# Patient Record
Sex: Male | Born: 1970 | Race: White | Hispanic: No | Marital: Married | Smoking: Former smoker
Health system: Southern US, Community
[De-identification: ages and names within clinical notes are randomized; demographics above are authoritative.]

## PROBLEM LIST (undated history)

## (undated) DIAGNOSIS — E119 Type 2 diabetes mellitus without complications: Secondary | ICD-10-CM

## (undated) DIAGNOSIS — I493 Ventricular premature depolarization: Secondary | ICD-10-CM

## (undated) DIAGNOSIS — I429 Cardiomyopathy, unspecified: Secondary | ICD-10-CM

## (undated) DIAGNOSIS — M109 Gout, unspecified: Secondary | ICD-10-CM

## (undated) DIAGNOSIS — I10 Essential (primary) hypertension: Secondary | ICD-10-CM

## (undated) DIAGNOSIS — I509 Heart failure, unspecified: Secondary | ICD-10-CM

## (undated) DIAGNOSIS — E785 Hyperlipidemia, unspecified: Secondary | ICD-10-CM

## (undated) DIAGNOSIS — R0683 Snoring: Secondary | ICD-10-CM

## (undated) DIAGNOSIS — R001 Bradycardia, unspecified: Secondary | ICD-10-CM

## (undated) DIAGNOSIS — G8929 Other chronic pain: Secondary | ICD-10-CM

## (undated) DIAGNOSIS — M545 Low back pain, unspecified: Secondary | ICD-10-CM

## (undated) DIAGNOSIS — R29818 Other symptoms and signs involving the nervous system: Secondary | ICD-10-CM

## (undated) DIAGNOSIS — I4729 Other ventricular tachycardia: Secondary | ICD-10-CM

## (undated) DIAGNOSIS — E669 Obesity, unspecified: Secondary | ICD-10-CM

## (undated) DIAGNOSIS — I2510 Atherosclerotic heart disease of native coronary artery without angina pectoris: Secondary | ICD-10-CM

## (undated) DIAGNOSIS — I5042 Chronic combined systolic (congestive) and diastolic (congestive) heart failure: Secondary | ICD-10-CM

## (undated) DIAGNOSIS — I428 Other cardiomyopathies: Secondary | ICD-10-CM

## (undated) DIAGNOSIS — I472 Ventricular tachycardia: Secondary | ICD-10-CM

## (undated) DIAGNOSIS — M1990 Unspecified osteoarthritis, unspecified site: Secondary | ICD-10-CM

## (undated) HISTORY — DX: Bradycardia, unspecified: R00.1

## (undated) HISTORY — PX: APPENDECTOMY: SHX54

## (undated) HISTORY — DX: Other cardiomyopathies: I42.8

## (undated) HISTORY — DX: Hyperlipidemia, unspecified: E78.5

## (undated) HISTORY — DX: Snoring: R06.83

## (undated) HISTORY — DX: Ventricular premature depolarization: I49.3

## (undated) HISTORY — PX: CARDIAC PACEMAKER PLACEMENT: SHX583

## (undated) HISTORY — DX: Cardiomyopathy, unspecified: I42.9

## (undated) HISTORY — PX: APPENDECTOMY (OPEN): SHX54

---

## 2011-07-27 ENCOUNTER — Encounter (HOSPITAL_COMMUNITY): Payer: Self-pay

## 2011-07-27 ENCOUNTER — Emergency Department (HOSPITAL_COMMUNITY): Payer: Self-pay

## 2011-07-27 ENCOUNTER — Emergency Department (HOSPITAL_COMMUNITY)
Admission: EM | Admit: 2011-07-27 | Discharge: 2011-07-27 | Disposition: A | Discharge status: Home / Self Care | Payer: Self-pay | Attending: Emergency Medicine | Admitting: Emergency Medicine

## 2011-07-27 DIAGNOSIS — M51379 Other intervertebral disc degeneration, lumbosacral region without mention of lumbar back pain or lower extremity pain: Secondary | ICD-10-CM | POA: Insufficient documentation

## 2011-07-27 DIAGNOSIS — IMO0002 Reserved for concepts with insufficient information to code with codable children: Secondary | ICD-10-CM

## 2011-07-27 DIAGNOSIS — M5137 Other intervertebral disc degeneration, lumbosacral region: Secondary | ICD-10-CM | POA: Insufficient documentation

## 2011-07-27 DIAGNOSIS — M549 Dorsalgia, unspecified: Secondary | ICD-10-CM | POA: Insufficient documentation

## 2011-07-27 DIAGNOSIS — R109 Unspecified abdominal pain: Secondary | ICD-10-CM | POA: Insufficient documentation

## 2011-07-27 LAB — DIFFERENTIAL
Basophils Absolute: 0 10*3/uL (ref 0.0–0.1)
Basophils Relative: 1 % (ref 0–1)
Eosinophils Absolute: 0.1 10*3/uL (ref 0.0–0.7)
Eosinophils Relative: 1 % (ref 0–5)
Monocytes Absolute: 0.5 10*3/uL (ref 0.1–1.0)

## 2011-07-27 LAB — BASIC METABOLIC PANEL
CO2: 26 mEq/L (ref 19–32)
Calcium: 9.2 mg/dL (ref 8.4–10.5)
Creatinine, Ser: 0.87 mg/dL (ref 0.50–1.35)
GFR calc Af Amer: >90 mL/min (ref >90)
GFR calc non Af Amer: >90 mL/min (ref >90)
Sodium: 135 mEq/L (ref 135–145)

## 2011-07-27 LAB — URINALYSIS, ROUTINE W REFLEX MICROSCOPIC
Glucose, UA: NEGATIVE mg/dL
Hgb urine dipstick: NEGATIVE
Ketones, ur: NEGATIVE mg/dL
Protein, ur: NEGATIVE mg/dL
Urobilinogen, UA: 0.2 mg/dL (ref 0.0–1.0)

## 2011-07-27 LAB — CBC
HCT: 40.4 % (ref 39.0–52.0)
MCHC: 33.7 g/dL (ref 30.0–36.0)
MCV: 88.2 fL (ref 78.0–100.0)
Platelets: 234 10*3/uL (ref 150–400)
RDW: 12.4 % (ref 11.5–15.5)
WBC: 6.9 10*3/uL (ref 4.0–10.5)

## 2011-07-27 MED ORDER — SODIUM CHLORIDE 0.9 % IV BOLUS (SEPSIS)
500.0000 mL | Freq: Once | INTRAVENOUS | Status: AC
  Administered 2011-07-27: 14:00:00 via INTRAVENOUS

## 2011-07-27 MED ORDER — SODIUM CHLORIDE 0.9 % IV SOLN: INTRAVENOUS | Status: DC

## 2011-07-27 MED ORDER — ONDANSETRON HCL 4 MG/2ML IJ SOLN
4.0000 mg | Freq: Once | INTRAMUSCULAR | Status: AC
  Administered 2011-07-27: 4 mg via INTRAVENOUS
  Filled 2011-07-27: qty 2

## 2011-07-27 MED ORDER — PREDNISONE 20 MG PO TABS
60.0000 mg | ORAL_TABLET | Freq: Once | ORAL | Status: AC
  Administered 2011-07-27: 60 mg via ORAL
  Filled 2011-07-27: qty 3

## 2011-07-27 MED ORDER — PREDNISONE 50 MG PO TABS: ORAL_TABLET | ORAL | Status: AC

## 2011-07-27 MED ORDER — HYDROMORPHONE HCL PF 1 MG/ML IJ SOLN
1.0000 mg | Freq: Once | INTRAMUSCULAR | Status: AC
  Administered 2011-07-27: 1 mg via INTRAVENOUS
  Filled 2011-07-27: qty 1

## 2011-07-27 NOTE — ED Notes (Signed)
Pt and wife verbalized understanding of d/c instructions and without further questions

## 2011-07-27 NOTE — ED Notes (Signed)
Pt with left flank pain for a week, denies N/V, denies burning with urination, denies hx of kidney stones, states pain is constant with twisting type pain that will get intense at times, denies anything that makes pain better or worse

## 2011-07-27 NOTE — ED Notes (Signed)
Complain of mid back pain for a week. No other symptoms

## 2011-07-27 NOTE — ED Notes (Signed)
Patient transported to CT 

## 2011-07-27 NOTE — ED Provider Notes (Addendum)
History     CSN: [id number]  Arrival date & time 07/27/11  1136   First MD Initiated Contact with Patient 07/27/11 1208      Chief Complaint  Patient presents with  . Back Pain    (Consider location/radiation/quality/duration/timing/severity/associated sxs/prior treatment) HPI Comments: Pain in L flank ~ 1 week.  No UTI sxs.  No fever or chills.  No known injury.  No kidney stone history.  Denies chronic back pain or radicular sxs.  The history is provided by the patient. No language interpreter was used.    History reviewed. No pertinent past medical history.  Past Surgical History  Procedure Date  . Appendectomy     No family history on file.  History  Substance Use Topics  . Smoking status: Not on file  . Smokeless tobacco: Not on file  . Alcohol Use: No      Review of Systems  Constitutional: Negative for fever and chills.  Genitourinary: Positive for flank pain. Negative for dysuria, urgency, frequency and hematuria.  Musculoskeletal: Positive for back pain.  Neurological: Negative for weakness and numbness.  All other systems reviewed and are negative.    Allergies  Review of patient's allergies indicates no known allergies.  Home Medications   Current Outpatient Rx  Name Route Sig Dispense Refill  . PREDNISONE 50 MG PO TABS  One tab po 6 tablet 0    BP 141/94  Pulse 63  Temp 98.2 F (36.8 C) (Oral)  Resp 20  Ht 6\' 2"  (1.88 m)  Wt 330 lb (149.687 kg)  BMI 42.37 kg/m2  SpO2 98%  Physical Exam  Nursing note and vitals reviewed. Constitutional: He is oriented to person, place, and time. He appears well-developed and well-nourished.  HENT:  Head: Normocephalic and atraumatic.  Eyes: EOM are normal.  Neck: Normal range of motion.  Cardiovascular: Normal rate, regular rhythm, normal heart sounds and intact distal pulses.   Pulmonary/Chest: Effort normal and breath sounds normal. No respiratory distress.  Abdominal: Soft. He exhibits no  distension. There is no tenderness.  Musculoskeletal: He exhibits tenderness.       Right shoulder: He exhibits decreased range of motion and tenderness. He exhibits no bony tenderness, no swelling and no pain.       Arms: Neurological: He is alert and oriented to person, place, and time. He has normal strength. No sensory deficit. Coordination and gait normal. GCS eye subscore is 4. GCS verbal subscore is 5. GCS motor subscore is 6.  Reflex Scores:      Patellar reflexes are 2+ on the right side and 2+ on the left side.      Achilles reflexes are 2+ on the right side and 2+ on the left side. Skin: Skin is warm and dry.  Psychiatric: He has a normal mood and affect. Judgment normal.    ED Course  Procedures (including critical care time)  Labs Reviewed  BASIC METABOLIC PANEL - Abnormal; Notable for the following:    Glucose, Bld 158 (*)     All other components within normal limits  URINALYSIS, ROUTINE W REFLEX MICROSCOPIC  CBC  DIFFERENTIAL   Ct Abdomen Pelvis Wo Contrast  07/27/2011  *RADIOLOGY REPORT*  Clinical Data: Left flank pain  CT ABDOMEN AND PELVIS WITHOUT CONTRAST  Technique:  Multidetector CT imaging of the abdomen and pelvis was performed following the standard protocol without intravenous contrast.  Comparison: None.  Findings: No hydronephrosis.  No urinary calculus.  Liver, gallbladder, spleen, pancreas, adrenal  glands are within normal limits.  No free fluid.  Status post appendectomy.  Bladder and prostate are unremarkable.  Severe degenerative disc disease in the lumbar spine with multilevel lateral recess narrowing, foraminal narrowing, and spinal stenosis.  IMPRESSION: No evidence of urinary calculus  Lumbar degenerative disc disease.  This could certainly be the etiology for flank pain.  Original Report Authenticated By: Donavan Burnet, M.D.     1. Back pain   2. Degenerative disc disease       MDM  rx- prednisone 50 mg, 6 Ice F/u with your  PCP        Worthy Rancher, PA 07/27/11 1622  Evalina Field, [place] 08/27/11 [phone]

## 2011-07-27 NOTE — ED Notes (Signed)
R. Miller, PA at bedside  

## 2011-07-27 NOTE — Discharge Instructions (Signed)
Cryotherapy Cryotherapy means treatment with cold. Ice or gel packs can be used to reduce both pain and swelling. Ice is the most helpful within the first 24 to 48 hours after an injury or flareup from overusing a muscle or joint. Sprains, strains, spasms, burning pain, shooting pain, and aches can all be eased with ice. Ice can also be used when recovering from surgery. Ice is effective, has very few side effects, and is safe for most people to use. PRECAUTIONS  Ice is not a safe treatment option for people with:  Raynaud's phenomenon. This is a condition affecting small blood vessels in the extremities. Exposure to cold may cause your problems to return.   Cold hypersensitivity. There are many forms of cold hypersensitivity, including:   Cold urticaria. Red, itchy hives appear on the skin when the tissues begin to warm after being iced.   Cold erythema. This is a red, itchy rash caused by exposure to cold.   Cold hemoglobinuria. Red blood cells break down when the tissues begin to warm after being iced. The hemoglobin that carry oxygen are passed into the urine because they cannot combine with blood proteins fast enough.   Numbness or altered sensitivity in the area being iced.  If you have any of the following conditions, do not use ice until you have discussed cryotherapy with your caregiver:  Heart conditions, such as arrhythmia, angina, or chronic heart disease.   High blood pressure.   Healing wounds or open skin in the area being iced.   Current infections.   Rheumatoid arthritis.   Poor circulation.   Diabetes.  Ice slows the blood flow in the region it is applied. This is beneficial when trying to stop inflamed tissues from spreading irritating chemicals to surrounding tissues. However, if you expose your skin to cold temperatures for too long or without the proper protection, you can damage your skin or nerves. Watch for signs of skin damage due to cold. HOME CARE  INSTRUCTIONS Follow these tips to use ice and cold packs safely.  Place a dry or damp towel between the ice and skin. A damp towel will cool the skin more quickly, so you may need to shorten the time that the ice is used.   For a more rapid response, add gentle compression to the ice.   Ice for no more than 10 to 20 minutes at a time. The bonier the area you are icing, the less time it will take to get the benefits of ice.   Check your skin after 5 minutes to make sure there are no signs of a poor response to cold or skin damage.   Rest 20 minutes or more in between uses.   Once your skin is numb, you can end your treatment. You can test numbness by very lightly touching your skin. The touch should be so light that you do not see the skin dimple from the pressure of your fingertip. When using ice, most people will feel these normal sensations in this order: cold, burning, aching, and numbness.   Do not use ice on someone who cannot communicate their responses to pain, such as small children or people with dementia.  HOW TO MAKE AN ICE PACK Ice packs are the most common way to use ice therapy. Other methods include ice massage, ice baths, and cryo-sprays. Muscle creams that cause a cold, tingly feeling do not offer the same benefits that ice offers and should not be used as a substitute  unless recommended by your caregiver. To make an ice pack, do one of the following:  Place crushed ice or a bag of frozen vegetables in a sealable plastic bag. Squeeze out the excess air. Place this bag inside another plastic bag. Slide the bag into a pillowcase or place a damp towel between your skin and the bag.   Mix 3 parts water with 1 part rubbing alcohol. Freeze the mixture in a sealable plastic bag. When you remove the mixture from the freezer, it will be slushy. Squeeze out the excess air. Place this bag inside another plastic bag. Slide the bag into a pillowcase or place a damp towel between your skin  and the bag.  SEEK MEDICAL CARE IF:  You develop white spots on your skin. This may give the skin a blotchy (mottled) appearance.   Your skin turns blue or pale.   Your skin becomes waxy or hard.   Your swelling gets worse.  MAKE SURE YOU:   Understand these instructions.   Will watch your condition.   Will get help right away if you are not doing well or get worse.  Document Released: 09/17/2010 Document Revised: 01/10/2011 Document Reviewed: 09/17/2010 [facility] Patient Information 2012 [place], [place].Degenerative Disc Disease Degenerative disc disease is a condition caused by the changes that occur in the cushions of the backbone (spinal discs) as you grow older. Spinal discs are soft and compressible discs located between the bones of the spine (vertebrae). They act like shock absorbers. Degenerative disc disease can affect the wholespine. However, the neck and lower back are most commonly affected. Many changes can occur in the spinal discs with aging, such as:  The spinal discs may dry and shrink.   Small tears may occur in the tough, outer covering of the disc (annulus).   The disc space may become smaller due to loss of water.   Abnormal growths in the bone (spurs) may occur. This can put pressure on the nerve roots exiting the spinal canal, causing pain.   The spinal canal may become narrowed.  CAUSES  Degenerative disc disease is a condition caused by the changes that occur in the spinal discs with aging. The exact cause is not known, but there is a genetic basis for many patients. Degenerative changes can occur due to loss of fluid in the disc. This makes the disc thinner and reduces the space between the backbones. Small cracks can develop in the outer layer of the disc. This can lead to the breakdown of the disc. You are more likely to get degenerative disc disease if you are overweight. Smoking cigarettes and doing heavy work such as weightlifting can also increase your  risk of this condition. Degenerative changes can start after a sudden injury. Growth of bone spurs can compress the nerve roots and cause pain.  SYMPTOMS  The symptoms vary from person to person. Some people may have no pain, while others have severe pain. The pain may be so severe that it can limit your activities. The location of the pain depends on the part of your backbone that is affected. You will have neck or arm pain if a disc in the neck area is affected. You will have pain in your back, buttocks, or legs if a disc in the lower back is affected. The pain becomes worse while bending, reaching up, or with twisting movements. The pain may start gradually and then get worse as time passes. It may also start after a major or  minor injury. You may feel numbness or tingling in the arms or legs.  DIAGNOSIS  Your caregiver will ask you about your symptoms and about activities or habits that may cause the pain. He or she may also ask about any injuries, diseases, ortreatments you have had earlier. Your caregiver will examine you to check for the range of movement that is possible in the affected area, to check for strength in your extremities, and to check for sensation in the areas of the arms and legs supplied by different nerve roots. An X-ray of the spine may be taken. Your caregiver may suggest other imaging tests, such as a computerized magnetic scan (MRI), if needed.  TREATMENT  Treatment includes rest, modifying your activities, and applying ice and heat. Your caregiver may prescribe medicines to reduce your pain and may ask you to do some exercises to strengthen your back. In some cases, you may need surgery. You and your caregiver will decide on the treatment that is best for you. HOME CARE INSTRUCTIONS   Follow proper lifting and walking techniques as advised by your caregiver.   Maintain good posture.   Exercise regularly as advised.   Perform relaxation exercises.   Change your sitting,  standing, and sleeping habits as advised. Change positions frequently.   Lose weight as advised.   Stop smoking if you smoke.   Wear supportive footwear.  SEEK MEDICAL CARE IF:  The pain does not go away within 1 to 4 weeks. SEEK IMMEDIATE MEDICAL CARE IF:   The pain is severe.   You notice weakness in your arms, hands, or legs.   You begin to lose control of your bladder or bowel.  MAKE SURE YOU:   Understand these instructions.   Will watch your condition.   Will get help right away if you are not doing well or get worse.  Document Released: 11/18/2006 Document Revised: 01/10/2011 Document Reviewed: 11/18/2006 [facility] Patient Information 2012 [place], [place].   Take the prednisone as directed.  Do NOT take any NSAID's while taking prednisone.  Apply ice several times daily to lower back.. Follow up with your MD on Monday for further instruction.

## 2011-07-30 NOTE — ED Provider Notes (Signed)
Medical screening examination/treatment/procedure(s) were performed by non-physician practitioner and as supervising physician I was immediately available for consultation/collaboration.   Shelda Jakes, MD 07/30/11 1057

## 2011-08-27 NOTE — ED Provider Notes (Signed)
Medical screening examination/treatment/procedure(s) were performed by non-physician practitioner and as supervising physician I was immediately available for consultation/collaboration.   Shelda Jakes, MD 08/27/11 2207

## 2015-07-30 ENCOUNTER — Encounter (HOSPITAL_COMMUNITY): Payer: Self-pay | Admitting: Emergency Medicine

## 2015-07-30 ENCOUNTER — Inpatient Hospital Stay (HOSPITAL_COMMUNITY)
Admission: EM | Admit: 2015-07-30 | Discharge: 2015-08-03 | DRG: 287 | Disposition: A | Discharge status: Home / Self Care | Payer: Self-pay | Attending: Cardiology | Admitting: Cardiology

## 2015-07-30 ENCOUNTER — Other Ambulatory Visit: Payer: Self-pay

## 2015-07-30 ENCOUNTER — Emergency Department (HOSPITAL_COMMUNITY): Payer: Self-pay

## 2015-07-30 DIAGNOSIS — E1165 Type 2 diabetes mellitus with hyperglycemia: Secondary | ICD-10-CM | POA: Diagnosis present

## 2015-07-30 DIAGNOSIS — E782 Mixed hyperlipidemia: Secondary | ICD-10-CM | POA: Diagnosis present

## 2015-07-30 DIAGNOSIS — E1169 Type 2 diabetes mellitus with other specified complication: Secondary | ICD-10-CM | POA: Diagnosis present

## 2015-07-30 DIAGNOSIS — I429 Cardiomyopathy, unspecified: Secondary | ICD-10-CM | POA: Diagnosis present

## 2015-07-30 DIAGNOSIS — E785 Hyperlipidemia, unspecified: Secondary | ICD-10-CM

## 2015-07-30 DIAGNOSIS — I472 Ventricular tachycardia: Secondary | ICD-10-CM | POA: Diagnosis present

## 2015-07-30 DIAGNOSIS — Z8249 Family history of ischemic heart disease and other diseases of the circulatory system: Secondary | ICD-10-CM

## 2015-07-30 DIAGNOSIS — R001 Bradycardia, unspecified: Secondary | ICD-10-CM | POA: Diagnosis present

## 2015-07-30 DIAGNOSIS — R079 Chest pain, unspecified: Secondary | ICD-10-CM

## 2015-07-30 DIAGNOSIS — G4730 Sleep apnea, unspecified: Secondary | ICD-10-CM | POA: Diagnosis present

## 2015-07-30 DIAGNOSIS — I200 Unstable angina: Secondary | ICD-10-CM | POA: Diagnosis present

## 2015-07-30 DIAGNOSIS — R0789 Other chest pain: Principal | ICD-10-CM | POA: Diagnosis present

## 2015-07-30 DIAGNOSIS — Z7982 Long term (current) use of aspirin: Secondary | ICD-10-CM

## 2015-07-30 DIAGNOSIS — Z87891 Personal history of nicotine dependence: Secondary | ICD-10-CM

## 2015-07-30 DIAGNOSIS — Z6841 Body Mass Index (BMI) 40.0 and over, adult: Secondary | ICD-10-CM

## 2015-07-30 LAB — CBC
HEMATOCRIT: 44 % (ref 39.0–52.0)
Hemoglobin: 14.8 g/dL (ref 13.0–17.0)
MCH: 30.1 pg (ref 26.0–34.0)
MCHC: 33.6 g/dL (ref 30.0–36.0)
MCV: 89.6 fL (ref 78.0–100.0)
PLATELETS: 233 10*3/uL (ref 150–400)
RBC: 4.91 MIL/uL (ref 4.22–5.81)
RDW: 12.5 % (ref 11.5–15.5)
WBC: 8.5 10*3/uL (ref 4.0–10.5)

## 2015-07-30 LAB — BASIC METABOLIC PANEL
ANION GAP: 7 (ref 5–15)
BUN: 19 mg/dL (ref 6–20)
CHLORIDE: 106 mmol/L (ref 101–111)
CO2: 25 mmol/L (ref 22–32)
Calcium: 9 mg/dL (ref 8.9–10.3)
Creatinine, Ser: 0.93 mg/dL (ref 0.61–1.24)
Glucose, Bld: 188 mg/dL — ABNORMAL HIGH (ref 65–99)
POTASSIUM: 4.3 mmol/L (ref 3.5–5.1)
SODIUM: 138 mmol/L (ref 135–145)

## 2015-07-30 LAB — TROPONIN I
TROPONIN I: 0.04 ng/mL — AB (ref <0.031)
Troponin I: 0.04 ng/mL — ABNORMAL HIGH (ref <0.031)

## 2015-07-30 LAB — GLUCOSE, CAPILLARY: Glucose-Capillary: 134 mg/dL — ABNORMAL HIGH (ref 65–99)

## 2015-07-30 LAB — I-STAT TROPONIN, ED: Troponin i, poc: 0.01 ng/mL (ref 0.00–0.08)

## 2015-07-30 LAB — TSH: TSH: 0.517 u[IU]/mL (ref 0.350–4.500)

## 2015-07-30 LAB — D-DIMER, QUANTITATIVE (NOT AT ARMC)

## 2015-07-30 MED ORDER — ASPIRIN EC 81 MG PO TBEC
81.0000 mg | DELAYED_RELEASE_TABLET | Freq: Every day | ORAL | Status: DC
  Administered 2015-07-31 – 2015-08-03 (×4): 81 mg via ORAL
  Filled 2015-07-30 (×4): qty 1

## 2015-07-30 MED ORDER — SODIUM CHLORIDE 0.9 % IV BOLUS (SEPSIS)
500.0000 mL | Freq: Once | INTRAVENOUS | Status: AC
  Administered 2015-07-30: 500 mL via INTRAVENOUS

## 2015-07-30 MED ORDER — MORPHINE SULFATE (PF) 2 MG/ML IV SOLN
2.0000 mg | Freq: Once | INTRAVENOUS | Status: AC
  Administered 2015-07-30: 2 mg via INTRAVENOUS
  Filled 2015-07-30 (×2): qty 1

## 2015-07-30 MED ORDER — NITROGLYCERIN 0.4 MG SL SUBL: 0.4000 mg | SUBLINGUAL_TABLET | SUBLINGUAL | Status: DC | PRN

## 2015-07-30 MED ORDER — SODIUM CHLORIDE 0.9% FLUSH
3.0000 mL | Freq: Two times a day (BID) | INTRAVENOUS | Status: DC
  Administered 2015-07-31 – 2015-08-03 (×6): 3 mL via INTRAVENOUS

## 2015-07-30 MED ORDER — ACETAMINOPHEN 650 MG RE SUPP: 650.0000 mg | Freq: Four times a day (QID) | RECTAL | Status: DC | PRN

## 2015-07-30 MED ORDER — ONDANSETRON HCL 4 MG/2ML IJ SOLN: 4.0000 mg | Freq: Four times a day (QID) | INTRAMUSCULAR | Status: DC | PRN

## 2015-07-30 MED ORDER — ACETAMINOPHEN 325 MG PO TABS
650.0000 mg | ORAL_TABLET | Freq: Four times a day (QID) | ORAL | Status: DC | PRN
  Administered 2015-07-30 – 2015-08-02 (×6): 650 mg via ORAL
  Filled 2015-07-30 (×7): qty 2

## 2015-07-30 MED ORDER — OXYCODONE HCL 5 MG PO TABS
5.0000 mg | ORAL_TABLET | ORAL | Status: DC | PRN
  Administered 2015-08-01 – 2015-08-02 (×2): 5 mg via ORAL
  Filled 2015-07-30 (×2): qty 1

## 2015-07-30 MED ORDER — MORPHINE SULFATE (PF) 2 MG/ML IV SOLN
2.0000 mg | INTRAVENOUS | Status: DC | PRN
  Administered 2015-07-31: 2 mg via INTRAVENOUS
  Filled 2015-07-30: qty 1

## 2015-07-30 MED ORDER — NITROGLYCERIN 2 % TD OINT
1.0000 [in_us] | TOPICAL_OINTMENT | Freq: Three times a day (TID) | TRANSDERMAL | Status: AC
  Administered 2015-07-30 (×2): 1 [in_us] via TOPICAL
  Filled 2015-07-30 (×2): qty 1

## 2015-07-30 MED ORDER — METOPROLOL TARTRATE 25 MG PO TABS
25.0000 mg | ORAL_TABLET | Freq: Once | ORAL | Status: AC
  Administered 2015-07-30: 25 mg via ORAL
  Filled 2015-07-30: qty 1

## 2015-07-30 MED ORDER — TRAZODONE HCL 50 MG PO TABS: 50.0000 mg | ORAL_TABLET | Freq: Every evening | ORAL | Status: DC | PRN

## 2015-07-30 MED ORDER — ATORVASTATIN CALCIUM 40 MG PO TABS
40.0000 mg | ORAL_TABLET | Freq: Every day | ORAL | Status: DC
  Administered 2015-07-30 – 2015-08-02 (×4): 40 mg via ORAL
  Filled 2015-07-30 (×4): qty 1

## 2015-07-30 MED ORDER — SENNA 8.6 MG PO TABS
1.0000 | ORAL_TABLET | Freq: Two times a day (BID) | ORAL | Status: DC
  Administered 2015-07-30 – 2015-08-03 (×8): 8.6 mg via ORAL
  Filled 2015-07-30 (×8): qty 1

## 2015-07-30 MED ORDER — SODIUM CHLORIDE 0.9% FLUSH: 3.0000 mL | INTRAVENOUS | Status: DC | PRN

## 2015-07-30 MED ORDER — ALBUTEROL SULFATE (2.5 MG/3ML) 0.083% IN NEBU: 2.5000 mg | INHALATION_SOLUTION | RESPIRATORY_TRACT | Status: DC | PRN

## 2015-07-30 MED ORDER — PNEUMOCOCCAL VAC POLYVALENT 25 MCG/0.5ML IJ INJ
0.5000 mL | INJECTION | INTRAMUSCULAR | Status: AC
  Administered 2015-07-31: 0.5 mL via INTRAMUSCULAR
  Filled 2015-07-30: qty 0.5

## 2015-07-30 MED ORDER — POLYETHYLENE GLYCOL 3350 17 G PO PACK
17.0000 g | PACK | Freq: Every day | ORAL | Status: DC | PRN
Start: 2015-07-30 — End: 2015-08-03

## 2015-07-30 MED ORDER — SODIUM CHLORIDE 0.9 % IV SOLN
250.0000 mL | INTRAVENOUS | Status: DC | PRN
  Administered 2015-07-31: 125 mL/h via INTRAVENOUS
  Administered 2015-07-31: 250 mL via INTRAVENOUS

## 2015-07-30 MED ORDER — ONDANSETRON HCL 4 MG PO TABS: 4.0000 mg | ORAL_TABLET | Freq: Four times a day (QID) | ORAL | Status: DC | PRN

## 2015-07-30 MED ORDER — ASPIRIN 81 MG PO CHEW
324.0000 mg | CHEWABLE_TABLET | Freq: Once | ORAL | Status: AC
  Administered 2015-07-30: 324 mg via ORAL
  Filled 2015-07-30: qty 4

## 2015-07-30 MED ORDER — HEPARIN SODIUM (PORCINE) 5000 UNIT/ML IJ SOLN
5000.0000 [IU] | Freq: Three times a day (TID) | INTRAMUSCULAR | Status: DC
  Administered 2015-07-30: 5000 [IU] via SUBCUTANEOUS
  Filled 2015-07-30: qty 1

## 2015-07-30 NOTE — ED Notes (Signed)
Pt initially refused Morphine stating he was not having pain.  Then changed his mind when I entered room with fluids.  States pain is intermittent in nature.

## 2015-07-30 NOTE — ED Provider Notes (Signed)
CSN: [id number]     Arrival date & time 07/30/15  0940 History  By signing my name below, I, Majel Homer, attest that this documentation has been prepared under the direction and in the presence of Loren Racer, MD . Electronically Signed: Majel Homer, Scribe. 07/30/2015. 12:20 PM.   Chief Complaint  Patient presents with  . Chest Pain   The history is provided by the patient. No language interpreter was used.   HPI Comments: Dani Mikrut is a 45 y.o. male who presents to the Emergency Department complaining of intermittent, radiating, chest pain and chest tightness that began at ~9:00AM this morning. Pt reports his pain is radiating to his left shoulder and left elbow. He states his pain is currently 2/10; however, it is 6/10 when his chest pain worsens. Pt states he was carrying something to the dumpster this morning when he experienced shortness of breath and bilateral leg heaviness. When he sat down he began having left-sided chest tightness. He reports associated symptoms of confusion, diaphoresis, headache, and lightheadedness when sitting up and down. He notes he has not experienced similar symptoms in a long time; he reports he visited [facility] ED ~3-4 years ago for chest pain. He reports FHx of heart disease at a young age. Father had MI in 44s and died in early 76s. No history of tobacco use. No recent extended travel or immobilization. He states he has not taken any medication to relieve his symptoms. Pt denies coughing, nausea, vomiting, abd pain or swelling in his lower extremities.   Past Medical History  Diagnosis Date  . Diabetes [facility])    Past Surgical History  Procedure Laterality Date  . Appendectomy     Family History  Problem Relation Age of Onset  . Heart attack Father 60    Died early 63's   . Coronary artery disease Brother 48    STENTS  . Angina Sister 66    Chronic   Social History  Substance Use Topics  . Smoking status: Former Games developer  . Smokeless tobacco: None   . Alcohol Use: No    Review of Systems  Constitutional: Positive for diaphoresis and activity change. Negative for fever and chills.  Respiratory: Positive for chest tightness and shortness of breath. Negative for cough and wheezing.   Cardiovascular: Positive for chest pain and palpitations. Negative for leg swelling.  Gastrointestinal: Negative for nausea, vomiting, abdominal pain, diarrhea and constipation.  Musculoskeletal: Negative for myalgias, back pain, neck pain and neck stiffness.  Skin: Positive for rash. Negative for wound.  Neurological: Positive for dizziness, weakness (lower extremities), light-headedness and headaches. Negative for seizures, syncope and numbness.  Psychiatric/Behavioral: Positive for confusion. The patient is nervous/anxious.   All other systems reviewed and are negative.  Allergies  Review of patient's allergies indicates no known allergies.  Home Medications   Prior to Admission medications   Not on File   Triage Vitals: BP 143/96 mmHg  Pulse 72  Temp(Src) 98.9 F (37.2 C) (Oral)  Resp 21  Ht 6\' 2"  (1.88 m)  Wt 320 lb (145.151 kg)  BMI 41.07 kg/m2  SpO2 100% Physical Exam  Constitutional: He is oriented to person, place, and time. He appears well-developed and well-nourished. No distress.  Anxious appearing  HENT:  Head: Normocephalic and atraumatic.  Mouth/Throat: Oropharynx is clear and moist.  Erythematous macular rash to forehead and bilateral cheeks  Eyes: EOM are normal. Pupils are equal, round, and reactive to light.  Neck: Normal range of motion. Neck  supple. No JVD present.  Cardiovascular: Normal rate and regular rhythm.  Exam reveals no gallop and no friction rub.   No murmur heard. Pulmonary/Chest: Effort normal and breath sounds normal. No respiratory distress. He has no wheezes. He has no rales. He exhibits no tenderness.  Abdominal: Soft. Bowel sounds are normal. He exhibits no distension and no mass. There is no  tenderness. There is no rebound and no guarding.  Musculoskeletal: Normal range of motion. He exhibits no edema or tenderness.  No lower extremity swelling, asymmetry or tenderness. Distal pulses are equal and intact.  Neurological: He is alert and oriented to person, place, and time.  5/5 motor in all extremities. Sensation is fully intact. No slurred speech. Cranial nerves II through XII grossly intact.  Skin: Skin is warm and dry. Rash noted. No erythema.  Psychiatric:  Anxious with some trouble word finding.   Nursing note and vitals reviewed.   ED Course  Procedures  DIAGNOSTIC STUDIES:  Oxygen Saturation is 100% on RA, normal by my interpretation.    COORDINATION OF CARE:    Labs Review Labs Reviewed  BASIC METABOLIC PANEL - Abnormal; Notable for the following:    Glucose, Bld 188 (*)    All other components within normal limits  HEMOGLOBIN A1C - Abnormal; Notable for the following:    Hgb A1c MFr Bld 8.8 (*)    All other components within normal limits  TROPONIN I - Abnormal; Notable for the following:    Troponin I 0.04 (*)    All other components within normal limits  TROPONIN I - Abnormal; Notable for the following:    Troponin I 0.04 (*)    All other components within normal limits  GLUCOSE, CAPILLARY - Abnormal; Notable for the following:    Glucose-Capillary 134 (*)    All other components within normal limits  BASIC METABOLIC PANEL - Abnormal; Notable for the following:    Glucose, Bld 186 (*)    BUN 24 (*)    Calcium 8.4 (*)    All other components within normal limits  CBC - Abnormal; Notable for the following:    Hemoglobin 12.8 (*)    All other components within normal limits  LIPID PANEL - Abnormal; Notable for the following:    Triglycerides 415 (*)    HDL 30 (*)    All other components within normal limits  CBC  D-DIMER, QUANTITATIVE (NOT AT [facility])  TSH  TROPONIN I  PROTIME-INR  I-STAT TROPOININ, ED    Imaging Review Dg Chest 2  View  07/30/2015  CLINICAL DATA:  Left-sided chest pain this morning. Shortness of breath. Former smoker. EXAM: CHEST  2 VIEW COMPARISON:  None. FINDINGS: The cardiomediastinal silhouette is within normal limits. The lungs are mildly hypoinflated. There is mild central airway thickening. No segmental airspace consolidation, overt pulmonary edema, pleural effusion, or pneumothorax is identified. No acute osseous abnormality is seen. IMPRESSION: Mild bronchitic changes. Electronically Signed   By: Sebastian Ache M.D.   On: 07/30/2015 10:23   I have personally reviewed and evaluated these images and lab results as part of my medical decision-making.   EKG Interpretation   Date/Time:  Sunday July 30 2015 09:45:56 EDT Ventricular Rate:  73 PR Interval:    QRS Duration: 102 QT Interval:  378 QTC Calculation: 417 R Axis:   67 Text Interpretation:  Sinus rhythm Anteroseptal infarct, old Confirmed by  Ranae Palms  MD, Andriel Omalley ([id number]) on 07/30/2015 10:46:47 AM  MDM   Final diagnoses:  Chest pain, unspecified chest pain type    I personally performed the services described in this documentation, which was scribed in my presence. The recorded information has been reviewed and is accurate.     EKG without any evidence of ischemia. Initial troponin is normal. Patient had miscarried improvement after nitroglycerin, aspirin and morphine. Discussed with cardiology. Recommend [facility] admission for rule out and can consult cardiology to see in the morning.    Loren Racer, MD 07/31/15 1220

## 2015-07-30 NOTE — ED Notes (Signed)
Pt states initially his pain was better, but rates at a 5 currently.  States this is the worse it has been, but that it comes in waves and does not last.  MD aware.

## 2015-07-30 NOTE — H&P (Signed)
Patient Demographics:    Matthew Sexton, is a 45 y.o. male  MRN: [id number]   DOB - [birth year]  Admit Date - 07/30/2015  Outpatient Primary MD for the patient is [name], MD   Assessment & Plan:    Principal Problem:   Chest pain Active Problems:   Diabetes (HCC)   Morbid obesity (HCC)    1)Atypical Chest Pain-  Cardiovascular risk factors include diabetes, obesity, sedentary lifestyle, strong family history of premature CAD in multiple first degree relatives,,   Refer to  Observation status on  telemetry monitored unit, check serial troponins and EKG for rule out acute coronary syndrome .  If patient rules out for ACS, we will consider stress in am , if stress test if positive then please get Cardiology consult.  Give aspirin, nitroglycerin, and betablocker.  patient has consistently reproducible Lt costo- chondral area chest tenderness     2)Obesity/Diet Controlled DM- hemoglobin A1c, diet and lifestyle modifications discussed    With History of - Reviewed by me  History reviewed. No pertinent past medical history.    Past Surgical History  Procedure Laterality Date  . Appendectomy        Chief Complaint  Patient presents with  . Chest Pain      HPI:    Matthew Sexton  is a 45 y.o. male, Past medical history relevant for diet-controlled diabetes, obesity, sedentary lifestyle, strong family history of premature CAD in multiple first degree relatives who presents with intermittent episodes of left-sided chest pain . Cp was intermittent, radiating, chest pain and chest tightness that began at ~9:00AM this morning. Pt reports his pain is radiating to his left shoulder and left elbow. Pt states he was carrying something to the dumpster this morning when he experienced shortness of breath and  bilateral leg heaviness. When he sat down he began having left-sided chest tightness. He reports associated symptoms of confusion, diaphoresis, headache, and lightheadedness when sitting up and down. He notes he has not experienced similar symptoms in a long time; he reports he visited [facility] ED , the negative stress echo in May 2013 at [facility] .  No tobacco use in over 30 years. No recent extended travel or immobilization.Pt denies coughing, nausea, vomiting, abd pain or swelling in his lower extremities. No pleuritic symptoms. Additional history obtained from patient's wife at bedside    Review of systems:    In addition to the HPI above,   A full 12 point Review of Systems was done, except as stated above, all other Review of Systems were negative.    Social History:  Reviewed by me    Social History  Substance Use Topics  . Smoking status: Former Games developer  . Smokeless tobacco: Not on file  . Alcohol Use: No       Family History :  Reviewed by me   History reviewed. No pertinent family history.  Home Medications:   Prior to Admission medications   Not on File     Allergies:    No Known Allergies   Physical Exam:   Vitals  Blood pressure 125/75, pulse 66, temperature 98.1 F (36.7 C), temperature source Oral, resp. rate 18, height 6\' 2"  (1.88 m), weight 145.151 kg (320 lb), SpO2 99 %.  Physical Examination: General appearance - alert, Obese, and in no distress  Mental status - alert, oriented to person, place, and time,  Eyes - sclera anicteric Neck - supple, no JVD elevation , Chest - clear  to auscultation bilaterally, symmetrical air movement,  patient has consistently reproducible Lt costo- chondral area chest tenderness Heart - S1 and S2 normal,  Abdomen - soft, nontender, nondistended,  increased truncal adiposity Neurological - screening mental status exam normal, neck supple without rigidity, cranial nerves II through XII intact,  DTR's normal and symmetric Extremities - no pedal edema noted, intact peripheral pulses , negative Homans Skin - warm, dry    Data Review:    CBC  Recent Labs Lab 07/30/15 0957  WBC 8.5  HGB 14.8  HCT 44.0  PLT 233  MCV 89.6  MCH 30.1  MCHC 33.6  RDW 12.5   ------------------------------------------------------------------------------------------------------------------  Chemistries   Recent Labs Lab 07/30/15 0957  NA 138  K 4.3  CL 106  CO2 25  GLUCOSE 188*  BUN 19  CREATININE 0.93  CALCIUM 9.0   ------------------------------------------------------------------------------------------------------------------ estimated creatinine clearance is 149.2 mL/min (by C-G formula based on Cr of 0.93). ------------------------------------------------------------------------------------------------------------------  Recent Labs  07/30/15 0945  TSH 0.517     Coagulation profile No results for input(s): INR, PROTIME in the last 168 hours. -------------------------------------------------------------------------------------------------------------------  Recent Labs  07/30/15 0957  DDIMER <0.27   -------------------------------------------------------------------------------------------------------------------  Cardiac Enzymes No results for input(s): CKMB, TROPONINI, MYOGLOBIN in the last 168 hours.  Invalid input(s): CK ------------------------------------------------------------------------------------------------------------------ No results found for: BNP   ---------------------------------------------------------------------------------------------------------------  Urinalysis    Component Value Date/Time   COLORURINE YELLOW 07/27/2011 1155   APPEARANCEUR CLEAR 07/27/2011 1155   LABSPEC 1.025 07/27/2011 1155   PHURINE 5.5 07/27/2011 1155   GLUCOSEU NEGATIVE 07/27/2011 1155   HGBUR NEGATIVE 07/27/2011 1155   BILIRUBINUR NEGATIVE 07/27/2011 1155     KETONESUR NEGATIVE 07/27/2011 1155   PROTEINUR NEGATIVE 07/27/2011 1155   UROBILINOGEN 0.2 07/27/2011 1155   NITRITE NEGATIVE 07/27/2011 1155   LEUKOCYTESUR NEGATIVE 07/27/2011 1155    ----------------------------------------------------------------------------------------------------------------   Imaging Results:    Dg Chest 2 View  07/30/2015  CLINICAL DATA:  Left-sided chest pain this morning. Shortness of breath. Former smoker. EXAM: CHEST  2 VIEW COMPARISON:  None. FINDINGS: The cardiomediastinal silhouette is within normal limits. The lungs are mildly hypoinflated. There is mild central airway thickening. No segmental airspace consolidation, overt pulmonary edema, pleural effusion, or pneumothorax is identified. No acute osseous abnormality is seen. IMPRESSION: Mild bronchitic changes. Electronically Signed   By: Sebastian Ache M.D.   On: 07/30/2015 10:23    Radiological Exams on Admission: Dg Chest 2 View  07/30/2015  CLINICAL DATA:  Left-sided chest pain this morning. Shortness of breath. Former smoker. EXAM: CHEST  2 VIEW COMPARISON:  None. FINDINGS: The cardiomediastinal silhouette is within normal limits. The lungs are mildly hypoinflated. There is mild central airway thickening. No segmental airspace consolidation, overt pulmonary edema, pleural effusion, or pneumothorax is identified. No acute osseous abnormality is seen. IMPRESSION: Mild bronchitic changes. Electronically Signed   By: Sebastian Ache M.D.   On: 07/30/2015 10:23  DVT Prophylaxis heparin  AM Labs Ordered, also please review Full Orders  Family Communication: Admission, patients condition and plan of care including tests being ordered have been discussed with the patient and wife who indicate understanding and agree with the plan   Code Status - Full Code  Likely DC to  Home in am   Condition   stable  Jayma Volpi M.D on 07/30/2015 at 5:01 PM   Between 7am to 7pm - Pager - [phone]  After 7pm  go to [email or web address] - password [email or web address]  [facility] - Office  [phone]  Dragon dictation system was used to create this note, attempts have been made to correct errors, however presence of uncorrected errors is not a reflection quality of care provided.

## 2015-07-30 NOTE — ED Notes (Signed)
Pt reports he was at church today and started having cp in left chest with no radiation, sob and lightheadness, pt denies cardiac history.

## 2015-07-31 ENCOUNTER — Encounter (HOSPITAL_COMMUNITY): Payer: Self-pay | Admitting: Adult Health

## 2015-07-31 ENCOUNTER — Observation Stay (HOSPITAL_COMMUNITY): Payer: Self-pay

## 2015-07-31 ENCOUNTER — Encounter (HOSPITAL_COMMUNITY)
Admission: EM | Disposition: A | Discharge status: Home / Self Care | Payer: Self-pay | Source: Home / Self Care | Attending: Cardiology

## 2015-07-31 ENCOUNTER — Ambulatory Visit (HOSPITAL_COMMUNITY): Admit: 2015-07-31 | Payer: Self-pay | Admitting: Interventional Cardiology

## 2015-07-31 DIAGNOSIS — E785 Hyperlipidemia, unspecified: Secondary | ICD-10-CM | POA: Insufficient documentation

## 2015-07-31 DIAGNOSIS — E782 Mixed hyperlipidemia: Secondary | ICD-10-CM | POA: Diagnosis present

## 2015-07-31 DIAGNOSIS — R0789 Other chest pain: Principal | ICD-10-CM

## 2015-07-31 DIAGNOSIS — I25110 Atherosclerotic heart disease of native coronary artery with unstable angina pectoris: Secondary | ICD-10-CM

## 2015-07-31 DIAGNOSIS — E1169 Type 2 diabetes mellitus with other specified complication: Secondary | ICD-10-CM | POA: Diagnosis present

## 2015-07-31 DIAGNOSIS — I200 Unstable angina: Secondary | ICD-10-CM | POA: Diagnosis present

## 2015-07-31 HISTORY — PX: CARDIAC CATHETERIZATION: SHX172

## 2015-07-31 LAB — BASIC METABOLIC PANEL
ANION GAP: 5 (ref 5–15)
BUN: 24 mg/dL — ABNORMAL HIGH (ref 6–20)
CHLORIDE: 105 mmol/L (ref 101–111)
CO2: 26 mmol/L (ref 22–32)
Calcium: 8.4 mg/dL — ABNORMAL LOW (ref 8.9–10.3)
Creatinine, Ser: 0.87 mg/dL (ref 0.61–1.24)
GFR calc Af Amer: >60 mL/min (ref >60)
GLUCOSE: 186 mg/dL — AB (ref 65–99)
POTASSIUM: 4.2 mmol/L (ref 3.5–5.1)
Sodium: 136 mmol/L (ref 135–145)

## 2015-07-31 LAB — LIPID PANEL
Cholesterol: 157 mg/dL (ref 0–200)
HDL: 30 mg/dL — ABNORMAL LOW (ref >40)
LDL CALC: UNDETERMINED mg/dL (ref 0–99)
Total CHOL/HDL Ratio: 5.2 RATIO
Triglycerides: 415 mg/dL — ABNORMAL HIGH (ref <150)
VLDL: UNDETERMINED mg/dL (ref 0–40)

## 2015-07-31 LAB — PROTIME-INR
INR: 1.14 (ref 0.00–1.49)
Prothrombin Time: 14.7 seconds (ref 11.6–15.2)

## 2015-07-31 LAB — TROPONIN I: Troponin I: 0.03 ng/mL (ref <0.031)

## 2015-07-31 LAB — CREATININE, SERUM
Creatinine, Ser: 0.92 mg/dL (ref 0.61–1.24)
GFR calc non Af Amer: >60 mL/min (ref >60)

## 2015-07-31 LAB — CBC
HEMATOCRIT: 39.2 % (ref 39.0–52.0)
HEMATOCRIT: 40.6 % (ref 39.0–52.0)
HEMOGLOBIN: 12.8 g/dL — AB (ref 13.0–17.0)
Hemoglobin: 13.5 g/dL (ref 13.0–17.0)
MCH: 29.8 pg (ref 26.0–34.0)
MCH: 29.9 pg (ref 26.0–34.0)
MCHC: 32.7 g/dL (ref 30.0–36.0)
MCHC: 33.3 g/dL (ref 30.0–36.0)
MCV: 91.2 fL (ref 78.0–100.0)
MCV: 90 fL (ref 78.0–100.0)
Platelets: 205 10*3/uL (ref 150–400)
Platelets: 201 10*3/uL (ref 150–400)
RBC: 4.3 MIL/uL (ref 4.22–5.81)
RBC: 4.51 MIL/uL (ref 4.22–5.81)
RDW: 12.4 % (ref 11.5–15.5)
RDW: 12.6 % (ref 11.5–15.5)
WBC: 8.4 10*3/uL (ref 4.0–10.5)
WBC: 8.4 10*3/uL (ref 4.0–10.5)

## 2015-07-31 LAB — HEMOGLOBIN A1C
HEMOGLOBIN A1C: 8.8 % — AB (ref 4.8–5.6)
Mean Plasma Glucose: 206 mg/dL

## 2015-07-31 SURGERY — LEFT HEART CATH AND CORONARY ANGIOGRAPHY

## 2015-07-31 MED ORDER — HEPARIN (PORCINE) IN NACL 2-0.9 UNIT/ML-% IJ SOLN
INTRAMUSCULAR | Status: DC | PRN
  Administered 2015-07-31: 16:00:00

## 2015-07-31 MED ORDER — MIDAZOLAM HCL 2 MG/2ML IJ SOLN
INTRAMUSCULAR | Status: DC | PRN
  Administered 2015-07-31 (×2): 1 mg via INTRAVENOUS

## 2015-07-31 MED ORDER — SODIUM CHLORIDE 0.9% FLUSH: 3.0000 mL | Freq: Two times a day (BID) | INTRAVENOUS | Status: DC

## 2015-07-31 MED ORDER — SODIUM CHLORIDE 0.9 % WEIGHT BASED INFUSION: 3.0000 mL/kg/h | INTRAVENOUS | Status: DC

## 2015-07-31 MED ORDER — LIDOCAINE HCL (PF) 1 % IJ SOLN
INTRAMUSCULAR | Status: DC | PRN
  Administered 2015-07-31: 3 mL

## 2015-07-31 MED ORDER — ASPIRIN 81 MG PO CHEW: 81.0000 mg | CHEWABLE_TABLET | ORAL | Status: DC

## 2015-07-31 MED ORDER — SODIUM CHLORIDE 0.9% FLUSH
3.0000 mL | Freq: Two times a day (BID) | INTRAVENOUS | Status: DC
  Administered 2015-08-01 – 2015-08-02 (×3): 3 mL via INTRAVENOUS

## 2015-07-31 MED ORDER — SODIUM CHLORIDE 0.9 % WEIGHT BASED INFUSION: 1.0000 mL/kg/h | INTRAVENOUS | Status: DC

## 2015-07-31 MED ORDER — SODIUM CHLORIDE 0.9 % IV SOLN: INTRAVENOUS | Status: DC

## 2015-07-31 MED ORDER — FENTANYL CITRATE (PF) 100 MCG/2ML IJ SOLN
INTRAMUSCULAR | Status: DC | PRN
  Administered 2015-07-31: 50 ug via INTRAVENOUS
  Administered 2015-07-31: 25 ug via INTRAVENOUS

## 2015-07-31 MED ORDER — HEPARIN BOLUS VIA INFUSION
5000.0000 [IU] | Freq: Once | INTRAVENOUS | Status: AC
  Administered 2015-07-31: 5000 [IU] via INTRAVENOUS
  Filled 2015-07-31: qty 5000

## 2015-07-31 MED ORDER — SODIUM CHLORIDE 0.9 % WEIGHT BASED INFUSION: 1.0000 mL/kg/h | INTRAVENOUS | Status: AC

## 2015-07-31 MED ORDER — HEPARIN SODIUM (PORCINE) 1000 UNIT/ML IJ SOLN
INTRAMUSCULAR | Status: AC
  Filled 2015-07-31: qty 1

## 2015-07-31 MED ORDER — SODIUM CHLORIDE 0.9 % IV SOLN: 250.0000 mL | INTRAVENOUS | Status: DC | PRN

## 2015-07-31 MED ORDER — HEPARIN (PORCINE) IN NACL 2-0.9 UNIT/ML-% IJ SOLN
INTRAMUSCULAR | Status: AC
  Filled 2015-07-31: qty 1500

## 2015-07-31 MED ORDER — SODIUM CHLORIDE 0.9% FLUSH: 3.0000 mL | INTRAVENOUS | Status: DC | PRN

## 2015-07-31 MED ORDER — FUROSEMIDE 10 MG/ML IJ SOLN
40.0000 mg | Freq: Once | INTRAMUSCULAR | Status: AC
  Administered 2015-07-31: 40 mg via INTRAVENOUS
  Filled 2015-07-31: qty 4

## 2015-07-31 MED ORDER — VERAPAMIL HCL 2.5 MG/ML IV SOLN
INTRAVENOUS | Status: AC
  Filled 2015-07-31: qty 2

## 2015-07-31 MED ORDER — FENTANYL CITRATE (PF) 100 MCG/2ML IJ SOLN
INTRAMUSCULAR | Status: AC
  Filled 2015-07-31: qty 2

## 2015-07-31 MED ORDER — LIDOCAINE HCL (PF) 1 % IJ SOLN
INTRAMUSCULAR | Status: AC
  Filled 2015-07-31: qty 30

## 2015-07-31 MED ORDER — IOPAMIDOL (ISOVUE-370) INJECTION 76%
INTRAVENOUS | Status: DC | PRN
  Administered 2015-07-31: 85 mL

## 2015-07-31 MED ORDER — HEPARIN (PORCINE) IN NACL 100-0.45 UNIT/ML-% IJ SOLN
1800.0000 [IU]/h | INTRAMUSCULAR | Status: DC
  Administered 2015-07-31: 1800 [IU]/h via INTRAVENOUS
  Filled 2015-07-31: qty 250

## 2015-07-31 MED ORDER — VERAPAMIL HCL 2.5 MG/ML IV SOLN
INTRAVENOUS | Status: DC | PRN
  Administered 2015-07-31: 10 mL via INTRA_ARTERIAL

## 2015-07-31 MED ORDER — METOPROLOL TARTRATE 25 MG PO TABS
25.0000 mg | ORAL_TABLET | Freq: Two times a day (BID) | ORAL | Status: DC
  Administered 2015-07-31 – 2015-08-01 (×3): 25 mg via ORAL
  Filled 2015-07-31 (×3): qty 1

## 2015-07-31 MED ORDER — HEPARIN SODIUM (PORCINE) 1000 UNIT/ML IJ SOLN
INTRAMUSCULAR | Status: DC | PRN
  Administered 2015-07-31: 6000 [IU] via INTRAVENOUS

## 2015-07-31 MED ORDER — MIDAZOLAM HCL 2 MG/2ML IJ SOLN
INTRAMUSCULAR | Status: AC
  Filled 2015-07-31: qty 2

## 2015-07-31 MED ORDER — IOPAMIDOL (ISOVUE-370) INJECTION 76%
INTRAVENOUS | Status: AC
  Filled 2015-07-31: qty 100

## 2015-07-31 SURGICAL SUPPLY — 9 items
CATH INFINITI 5FR ANG PIGTAIL (CATHETERS) ×3 IMPLANT
CATH OPTITORQUE TIG 4.0 5F (CATHETERS) ×3 IMPLANT
DEVICE RAD COMP TR BAND LRG (VASCULAR PRODUCTS) ×3 IMPLANT
GLIDESHEATH SLEND A-KIT 6F 22G (SHEATH) ×3 IMPLANT
KIT HEART LEFT (KITS) ×3 IMPLANT
PACK CARDIAC CATHETERIZATION (CUSTOM PROCEDURE TRAY) ×3 IMPLANT
TRANSDUCER W/STOPCOCK (MISCELLANEOUS) ×3 IMPLANT
TUBING CIL FLEX 10 FLL-RA (TUBING) ×3 IMPLANT
WIRE SAFE-T 1.5MM-J .035X260CM (WIRE) ×3 IMPLANT

## 2015-07-31 NOTE — Consult Note (Addendum)
CARDIOLOGY CONSULT NOTE   Patient ID: Matthew Sexton MRN: [id number] DOB/AGE: 1970-12-25 45 y.o.  Admit Date: 07/30/2015 Referring Physician: Rhys Martini MD Primary Physician: [name], MD Consulting Cardiologist: Dina Rich MD Primary Cardiologist: New Reason for Consultation: Chest Pain  Clinical Summary Matthew Sexton is a 45 y.o.male with no prior history of coronary artery disease, but family history of early onset CAD in his father,brother and sister, presented to the ER with complaints of chest pain. He has history of diabetes but is not taking medication. He has been feeling much more tired, less energy, fatigue and chest discomfort with minimal exertion. 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 Lady at his church brought him to ER.   On arrival to ER, BP 143/96, HR 72, O2 Sat 100%, afebrile. Per tenant labs elevated blood glucose, 188,  Troponin 0.01, o.o4, 0.04, and 0.03 respectively.He was not found to be anemic.  EKG NSR with evidence of old septa//anterior infarct. TG elevated at 415, TC 157, HDL 30,  with inability to calculate LDL. He was treated with ASA, morphine, and started on BB, statin and heparin SQ. He is also receiving some pain control with oxycodone. He has NTG paste on chest.   No Known Allergies  Medications Scheduled Medications: . aspirin EC  81 mg Oral Daily  . atorvastatin  40 mg Oral q1800  . heparin  5,000 Units Subcutaneous Q8H  . pneumococcal 23 valent vaccine  0.5 mL Intramuscular Tomorrow-1000  . senna  1 tablet Oral BID  . sodium chloride flush  3 mL Intravenous Q12H    Infusions:    PRN Medications: sodium chloride, acetaminophen **OR** acetaminophen, albuterol, morphine injection, nitroGLYCERIN, ondansetron **OR** ondansetron (ZOFRAN) IV,  oxyCODONE, polyethylene glycol, sodium chloride flush, traZODone   Past Medical History  Diagnosis Date  . Diabetes [facility])     Past Surgical History  Procedure Laterality Date  . Appendectomy      Family History  Problem Relation Age of Onset  . Heart attack Father 41    Died early 44's   . Coronary artery disease Brother 33    STENTS  . Angina Sister 59    Chronic    Social History Matthew Sexton reports that he has quit smoking. He does not have any smokeless tobacco history on file. Matthew Sexton reports that he does not drink alcohol.  Review of Systems Complete review of systems are found to be negative unless outlined in H&P above.  Physical Examination Blood pressure 122/70, pulse 63, temperature 97.6 F (36.4 C), temperature source Oral, resp. rate 18, height  (1.88 m), weight 320 lb (145.151 kg), SpO2 99 %. No intake or output data in the 24 hours ending 07/31/15 0838  Telemetry:Sinus bradycardia rates in the 40's-50's.   GEN:No acute distress HEENT: Conjunctiva and lids normal, oropharynx clear with moist mucosa. Neck: Supple, no elevated JVP or carotid bruits, no thyromegaly. Lungs: Clear to auscultation, nonlabored breathing at rest. Cardiac: Regular rate and rhythm, no S3 or significant systolic murmur, no pericardial rub. Abdomen: Soft, nontender, no hepatomegaly, bowel sounds present, no guarding or rebound. Extremities: No pitting edema, distal pulses 2+. Skin: Warm and dry. Musculoskeletal: No kyphosis. Neuropsychiatric: Alert and oriented x3, affect grossly appropriate.  Prior Cardiac Testing/Procedures  Lab Results  Basic Metabolic Panel:  Recent Labs Lab 07/30/15 0957 07/31/15 0428  NA 138 136  K 4.3 4.2  CL 106 105  CO2 25 26  GLUCOSE 188* 186*  BUN 19 24*  CREATININE 0.93 0.87  CALCIUM 9.0 8.4*    CBC:  Recent Labs Lab 07/30/15 0957 07/31/15 0428  WBC 8.5 8.4  HGB 14.8 12.8*  HCT 44.0 39.2  MCV 89.6 91.2  PLT 233 205     Cardiac Enzymes:  Recent Labs Lab 07/30/15 1630 07/30/15 2301 07/31/15 0428  TROPONINI 0.04* 0.04* 0.03    BNP: Invalid input(s): POCBNP   Radiology: Dg Chest 2 View  07/30/2015  CLINICAL DATA:  Left-sided chest pain this morning. Shortness of breath. Former smoker. EXAM: CHEST  2 VIEW COMPARISON:  None. FINDINGS: The cardiomediastinal silhouette is within normal limits. The lungs are mildly hypoinflated. There is mild central airway thickening. No segmental airspace consolidation, overt pulmonary edema, pleural effusion, or pneumothorax is identified. No acute osseous abnormality is seen. IMPRESSION: Mild bronchitic changes. Electronically Signed   By: Sebastian Ache M.D.   On: 07/30/2015 10:23     ECG: NSR with evidence of anterior Q-waves   Impression and Recommendations  1.Chest Pain: Typical for angina. CVRF diabetes, obesity, strong family history of early CAD. Symptoms progressive with associated weakness, diaphoresis, fatigue and dyspnea. Recommend cardiac cath instead of stress test,desptie normal troponin, with symptoms and CVRF. Will discuss with Dr. Wyline Mood. I have broached the subject with patient and wife.   2. Diabetes: He has been on metformin in the past but stopped taking it a year ago. Was exercising by walking 5 miles a day but stopped. TG elevated. Now on statin. Recommend Hgb A1c  3. Obesity: Weight loss recommended.   Signed: Bettey Mare. Lawrence NP [facility]  07/31/2015, 8:38 AM Co-Sign MD  Patient seen and discussed with NP Lyman Bishop, I agree with her documentation. 45 yo male diet controlled DM2, obesity, FH of CAD admitted with chest pain.   TSH 0.5, D-dimer neg, Hgb 14.8, Plt 233, K 4.3, Cr 0.93, Hgb A1c 8.8 Trop 0.04-->0.04-->0.03 CXR mild bronchitis changes EKG NSR  Multiple CAD risk factors including poorly controlled DM2 and very strong family history of CAD. Symptoms suggestive of unstable of angina. EKG and enzymes thus far not overally  impressive, very mild trop 0.04. He has had some runs of NSVT on monitor up to 10 beats. Given CAD risk factors, strong story, and runs of NSVT high pretest probability of CAD, noninvasive imaging would not adequately rule out ischemia, we will plan for transfer to [facility] for cath. Start heparin gtt, start lopressor of UA and NSVT.  Dominga Ferry MD

## 2015-07-31 NOTE — Progress Notes (Addendum)
Report given to [facility]link and RN receiving patient at [facility]. Patient transported.  VSS. Lesly Dukes, RN

## 2015-07-31 NOTE — Progress Notes (Signed)
Pt voided for the first time after procedure and stated he had a sharp back pain shoot across. RN to administer IV pain medication, and will continue to monitor. Cecille Rubin, RN

## 2015-07-31 NOTE — Progress Notes (Signed)
Pt states pain in back is positional. PA notified

## 2015-07-31 NOTE — H&P (View-Only) (Signed)
  CARDIOLOGY CONSULT NOTE   Patient ID: Matthew Sexton MRN: [id number] DOB/AGE: 07/11/1970 45 y.o.  Admit Date: 07/30/2015 Referring Physician: TRH-Emokpae MD Primary Physician: Matthew W, MD Consulting Cardiologist: Matthew Corp MD Primary Cardiologist: Matthew Sexton Reason for Consultation: Chest Pain  Clinical Summary Matthew Sexton is a 45 y.o.male with no prior history of coronary artery disease, but family history of early onset CAD in his father,brother and sister, presented to the ER with complaints of chest pain. He has history of diabetes but is not taking medication. He has been feeling much more tired, less energy, fatigue and chest discomfort with minimal exertion. 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 Lady at his church brought him to ER.   On arrival to ER, BP 143/96, HR 72, O2 Sat 100%, afebrile. Per tenant labs elevated blood glucose, 188,  Troponin 0.01, o.o4, 0.04, and 0.03 respectively.He was not found to be anemic.  EKG NSR with evidence of old septa//anterior infarct. TG elevated at 415, TC 157, HDL 30,  with inability to calculate LDL. He was treated with ASA, morphine, and started on BB, statin and heparin SQ. He is also receiving some pain control with oxycodone. He has NTG paste on chest.   No Known Allergies  Medications Scheduled Medications: . aspirin EC  81 mg Oral Daily  . atorvastatin  40 mg Oral q1800  . heparin  5,000 Units Subcutaneous Q8H  . pneumococcal 23 valent vaccine  0.5 mL Intramuscular Tomorrow-1000  . senna  1 tablet Oral BID  . sodium chloride flush  3 mL Intravenous Q12H    Infusions:    PRN Medications: sodium chloride, acetaminophen **OR** acetaminophen, albuterol, morphine injection, nitroGLYCERIN, ondansetron **OR** ondansetron (ZOFRAN) IV,  oxyCODONE, polyethylene glycol, sodium chloride flush, traZODone   Past Medical History  Diagnosis Date  . Diabetes (HCC)     Past Surgical History  Procedure Laterality Date  . Appendectomy      Family History  Problem Relation Age of Onset  . Heart attack Father 45    Died early 50's   . Coronary artery disease Brother 45    STENTS  . Angina Sister 42    Chronic    Social History Matthew Sexton reports that he has quit smoking. He does not have any smokeless tobacco history on file. Matthew Sexton reports that he does not drink alcohol.  Review of Systems Complete review of systems are found to be negative unless outlined in H&P above.  Physical Examination Blood pressure 122/70, pulse 63, temperature 97.6 F (36.4 C), temperature source Oral, resp. rate 18, height 6' 2" (1.88 m), weight 320 lb (145.151 kg), SpO2 99 %. No intake or output data in the 24 hours ending 07/31/15 0838  Telemetry:Sinus bradycardia rates in the 40's-50's.   GEN:No acute distress HEENT: Conjunctiva and lids normal, oropharynx clear with moist mucosa. Neck: Supple, no elevated JVP or carotid bruits, no thyromegaly. Lungs: Clear to auscultation, nonlabored breathing at rest. Cardiac: Regular rate and rhythm, no S3 or significant systolic murmur, no pericardial rub. Abdomen: Soft, nontender, no hepatomegaly, bowel sounds present, no guarding or rebound. Extremities: No pitting edema, distal pulses 2+. Skin: Warm and dry. Musculoskeletal: No kyphosis. Neuropsychiatric: Alert and oriented x3, affect grossly appropriate.  Prior Cardiac Testing/Procedures  Lab Results  Basic Metabolic Panel:    Recent Labs Lab 07/30/15 0957 07/31/15 0428  NA 138 136  K 4.3 4.2  CL 106 105  CO2 25 26  GLUCOSE 188* 186*  BUN 19 24*  CREATININE 0.93 0.87  CALCIUM 9.0 8.4*    CBC:  Recent Labs Lab 07/30/15 0957 07/31/15 0428  WBC 8.5 8.4  HGB 14.8 12.8*  HCT 44.0 39.2  MCV 89.6 91.2  PLT 233 205     Cardiac Enzymes:  Recent Labs Lab 07/30/15 1630 07/30/15 2301 07/31/15 0428  TROPONINI 0.04* 0.04* 0.03    BNP: Invalid input(s): POCBNP   Radiology: Dg Chest 2 View  07/30/2015  CLINICAL DATA:  Left-sided chest pain this morning. Shortness of breath. Former smoker. EXAM: CHEST  2 VIEW COMPARISON:  None. FINDINGS: The cardiomediastinal silhouette is within normal limits. The lungs are mildly hypoinflated. There is mild central airway thickening. No segmental airspace consolidation, overt pulmonary edema, pleural effusion, or pneumothorax is identified. No acute osseous abnormality is seen. IMPRESSION: Mild bronchitic changes. Electronically Signed   By: Matthew Sexton M.D.   On: 07/30/2015 10:23     ECG: NSR with evidence of anterior Q-waves   Impression and Recommendations  1.Chest Pain: Typical for angina. CVRF diabetes, obesity, strong family history of early CAD. Symptoms progressive with associated weakness, diaphoresis, fatigue and dyspnea. Recommend cardiac cath instead of stress test,desptie normal troponin, with symptoms and CVRF. Will discuss with Dr. Wyline Sexton. I have broached the subject with patient and wife.   2. Diabetes: He has been on metformin in the past but stopped taking it a year ago. Was exercising by walking 5 miles a day but stopped. TG elevated. Now on statin. Recommend Hgb A1c  3. Obesity: Weight loss recommended.   Signed: Bettey Mare. Matthew Sexton  07/31/2015, 8:38 AM Co-Sign MD  Patient seen and discussed with NP Matthew Sexton, I agree with her documentation. 45 yo male diet controlled DM2, obesity, FH of CAD admitted with chest pain.   TSH 0.5, D-dimer neg, Hgb 14.8, Plt 233, K 4.3, Cr 0.93, Hgb A1c 8.8 Trop 0.04-->0.04-->0.03 CXR mild bronchitis changes EKG NSR  Multiple CAD risk factors including poorly controlled DM2 and very strong family history of CAD. Symptoms suggestive of unstable of angina. EKG and enzymes thus far not overally  impressive, very mild trop 0.04. He has had some runs of NSVT on monitor up to 10 beats. Given CAD risk factors, strong story, and runs of NSVT high pretest probability of CAD, noninvasive imaging would not adequately rule out ischemia, we will plan for transfer to [facility] for cath. Start heparin gtt, start lopressor of UA and NSVT.  Dominga Ferry MD

## 2015-07-31 NOTE — Progress Notes (Signed)
Inpatient Diabetes Program Recommendations  AACE/ADA: New Consensus Statement on Inpatient Glycemic Control (2015)  Target Ranges:  Prepandial:   less than 140 mg/dL      Peak postprandial:   less than 180 mg/dL (1-2 hours)      Critically ill patients:  140 - 180 mg/dL   Results for LORD, BADLEY (MRN [id number]) as of 07/31/2015 10:16  Ref. Range 07/30/2015 09:45  Hemoglobin A1C Latest Ref Range: 4.8-5.6 % 8.8 (H)    Admit with: CP  History: DM2  Home DM Meds: None- Diet controlled  Current Insulin Orders: None     MD- Please consider the following:  1. Start Novolog Sensitive Correction Scale/ SSI (0-9 units) TID AC + HS  2. Patient likely would benefit from Oral DM medication at time of discharge.  Current A1c= 8.8%.  Per Cardiology notes, patient was taking Metformin at home but stopped this medication about 1 year ago.  Please consider restarting Metformin at time of discharge.  Metformin is generic and can be purchased at [facility] for $4.      --Will follow patient during hospitalization--  Ambrose Finland RN, MSN, CDE Diabetes Coordinator Inpatient Glycemic Control Team Team Pager: [phone] (8a-5p)

## 2015-07-31 NOTE — Interval H&P Note (Signed)
History and Physical Interval Note:  07/31/2015 3:47 PM  Matthew Sexton  has presented today for surgery, with the diagnosis of unstable angina.  The various methods of treatment have been discussed with the patient and family. After consideration of risks, benefits and other options for treatment, the patient has consented to  Procedure(s): Left Heart Cath and Coronary Angiography (N/A) with possible Percutaneous Coronary Intervention as a surgical intervention .  The patient's history has been reviewed, patient examined, no change in status, stable for surgery.  I have reviewed the patient's chart and labs.  Questions were answered to the patient's satisfaction.    Cath Lab Visit (complete for each Cath Lab visit)  Clinical Evaluation Leading to the Procedure:   ACS: Yes.    Non-ACS:    Anginal Classification: CCS III  Anti-ischemic medical therapy: Minimal Therapy (1 class of medications)  Non-Invasive Test Results: No non-invasive testing performed  Prior CABG: No previous CABG  AUC for Cath CAD Assessment (Coronary Angiography With or Without Left Heart Catheterization and/or Left Ventriculography)  Patient Information:  Suspected ACS, Low Risk  AUC Score:   A (7)   Indication:   4   AUC of PCI  TIMI SCORE  Patient Information:  TIMI Score is 3  UA/NSTEMI and intermediate-risk features (e.g., TIMI score 3?4) for short-term risk of death or nonfatal MI  Revascularization of the presumed culprit artery   A (9)  Indication: 10; Score: 9  Bryan Lemma, M.D., M.S. Interventional Cardiologist   Pager # [phone] Phone # [phone] [address] [place], [place] [address]

## 2015-07-31 NOTE — Discharge Summary (Signed)
Matthew Sexton, is a 45 y.o. male  DOB 1970/12/24  MRN [id number].  Admission date:  07/30/2015  Admitting Physician  Shon Hale, MD  Discharge Date:  07/31/2015   Primary MD  [name], MD  Recommendations for primary care physician for things to follow:   Start Metformin after 08/04/15  Start Niacin Lifestyle (exercise) and Dietary Modifications advised    Admission Diagnosis  Chest pain [R07.9] Chest pain, unspecified chest pain type [R07.9]   Discharge Diagnosis  Chest pain [R07.9] Chest pain, unspecified chest pain type [R07.9]    Principal Problem:   Chest pain Active Problems:   Diabetes (HCC)   Morbid obesity (HCC)   Mixed dyslipidemia   DM type 2 with diabetic dyslipidemia (HCC)      Past Medical History  Diagnosis Date  . Diabetes [facility])     Past Surgical History  Procedure Laterality Date  . Appendectomy         HPI  from the history and physical done on the day of admission:   Matthew Sexton is a 45 y.o. male, Past medical history relevant for diet-controlled diabetes, obesity, sedentary lifestyle, strong family history of premature CAD in multiple first degree relatives who presents with intermittent episodes of left-sided chest pain . Cp was intermittent, radiating, chest pain and chest tightness that began at ~9:00AM this morning. Pt reports his pain is radiating to his left shoulder and left elbow. Pt states he was carrying something to the dumpster this morning when he experienced shortness of breath and bilateral leg heaviness. When he sat down he began having left-sided chest tightness. He reports associated symptoms of confusion, diaphoresis, headache, and lightheadedness when sitting up and down. He notes he has not experienced similar symptoms in a long time; he reports he visited [facility] ED , the negative stress echo in May 2013 at [facility] . No  tobacco use in over 30 years. No recent extended travel or immobilization.Pt denies coughing, nausea, vomiting, abd pain or swelling in his lower extremities. No pleuritic symptoms. Additional history obtained from patient's wife at bedside      Hospital Course:        1)Atypical Chest Pain- Cardiovascular risk factors include male gender, Dyslipidemia, diabetes, obesity, sedentary lifestyle, strong family history of premature CAD in multiple first degree relatives,  serial troponins were borderline,  EKG w/o acute changes. Cardiology consult appreciated and they recommended left heart catheterization at [facility] on 07/31/2015. c/n aspirin, nitroglycerin, and betablocker. patient has consistently reproducible Lt costo- chondral area chest tenderness   2)Obesity/DM 2 /Dyslipidemia- HDL is 30, triglycerides is 415, Hgb A1C is 8.8, Start Metformin after 08/04/15 , Start Niacin. Lifestyle (exercise) and Dietary Modifications advised    Discharge Condition: Transferred to cardiology service for left heart catheterization on 07/31/2015 at [facility]  Follow UP     Consults obtained - cardiology  Diet and Activity recommendation:  As advised  Discharge Instructions    Start Metformin after 08/04/15  Start Niacin Lifestyle (exercise)  and Dietary Modifications advised      Discharge Medications       Medication List    Notice    You have not been prescribed any medications.      Major procedures and Radiology Reports - PLEASE review detailed and final reports for all details, in brief -   LHC pending   Dg Chest 2 View  07/30/2015  CLINICAL DATA:  Left-sided chest pain this morning. Shortness of breath. Former smoker. EXAM: CHEST  2 VIEW COMPARISON:  None. FINDINGS: The cardiomediastinal silhouette is within normal limits. The lungs are mildly hypoinflated. There is mild central airway thickening. No segmental airspace consolidation, overt pulmonary edema, pleural  effusion, or pneumothorax is identified. No acute osseous abnormality is seen. IMPRESSION: Mild bronchitic changes. Electronically Signed   By: Sebastian Ache M.D.   On: 07/30/2015 10:23    Micro Results  No results found for this or any previous visit (from the past 240 hour(s)).     Today   Subjective    Matthew Sexton today has no new c/o, patient had left-sided intermittent chest pains overnight, had runs of nonsustained V. Tach,           Patient has been seen and examined prior to discharge   Objective   Blood pressure 148/72, pulse 65, temperature 98.2 F (36.8 C), temperature source Oral, resp. rate 18, height  (1.88 m), weight 145.151 kg (320 lb), SpO2 98 %.  No intake or output data in the 24 hours ending 07/31/15 1117  Exam Gen:- Awake  , in NAD, wife at bedside HEENT:- [place].AT,   Neck-Supple Neck,No JVD,  Lungs- mostly clear  CV- S1, S2 normal, consistently reproducible left costochondral area tenderness on palpation and with positional change Abd-  +ve B.Sounds, Abd Soft, No tenderness,    Extremity/Skin:- Intact peripheral pulses     Data Review   CBC w Diff:  Lab Results  Component Value Date   WBC 8.4 07/31/2015   HGB 12.8* 07/31/2015   HCT 39.2 07/31/2015   PLT 205 07/31/2015   LYMPHOPCT 27 07/27/2011   MONOPCT 7 07/27/2011   EOSPCT 1 07/27/2011   BASOPCT 1 07/27/2011    CMP:  Lab Results  Component Value Date   NA 136 07/31/2015   K 4.2 07/31/2015   CL 105 07/31/2015   CO2 26 07/31/2015   BUN 24* 07/31/2015   CREATININE 0.87 07/31/2015  .   Total Discharge time is about 33 minutes  Matthew Sexton M.D on 07/31/2015 at 11:17 AM  [facility]   Office  [phone]  Dragon dictation system was used to create this note, attempts have been made to correct errors, however presence of uncorrected errors is not a reflection quality of care provided

## 2015-07-31 NOTE — Progress Notes (Signed)
ANTICOAGULATION CONSULT NOTE - Initial Consult  Pharmacy Consult for heparin Indication: chest pain/ACS  No Known Allergies  Patient Measurements: Height: 6\' 2"  (188 cm) Weight: (!) 320 lb (145.151 kg) IBW/kg (Calculated) : 82.2 Heparin Dosing Weight: 115.5  Vital Signs: Temp: 97.6 F (36.4 C) (06/26 0500) Temp Source: Oral (06/26 0500) BP: 122/70 mmHg (06/26 0500) Pulse Rate: 63 (06/26 0500)  Labs:  Recent Labs  07/30/15 0957 07/30/15 1630 07/30/15 2301 07/31/15 0428  HGB 14.8  --   --  12.8*  HCT 44.0  --   --  39.2  PLT 233  --   --  205  CREATININE 0.93  --   --  0.87  TROPONINI  --  0.04* 0.04* 0.03    Estimated Creatinine Clearance: 159.5 mL/min (by C-G formula based on Cr of 0.87).   Medical History: Past Medical History  Diagnosis Date  . Diabetes (HCC)     Medications:  No prescriptions prior to admission    Assessment: 45 yo man to start heparin for CP.  Plan to transfer to [facility] for cardiac cath. Goal of Therapy:  Heparin level 0.3-0.7 units/ml Monitor platelets by anticoagulation protocol: Yes   Plan:  Heparin 5000 unit bolus and drip at 1800 units/hr Check heparin level 6 hours after start. Daily CBC and heparin level Monitor for bleeding complications  Thanks for allowing pharmacy to be a part of this patient's care.  Talbert Cage, PharmD Clinical Pharmacist  07/31/2015,10:22 AM

## 2015-08-01 ENCOUNTER — Encounter (HOSPITAL_COMMUNITY): Payer: Self-pay | Admitting: Cardiology

## 2015-08-01 ENCOUNTER — Other Ambulatory Visit (HOSPITAL_COMMUNITY): Payer: Self-pay

## 2015-08-01 ENCOUNTER — Inpatient Hospital Stay (HOSPITAL_COMMUNITY): Payer: Self-pay

## 2015-08-01 DIAGNOSIS — I429 Cardiomyopathy, unspecified: Secondary | ICD-10-CM

## 2015-08-01 DIAGNOSIS — E1169 Type 2 diabetes mellitus with other specified complication: Secondary | ICD-10-CM

## 2015-08-01 DIAGNOSIS — E785 Hyperlipidemia, unspecified: Secondary | ICD-10-CM

## 2015-08-01 DIAGNOSIS — I200 Unstable angina: Secondary | ICD-10-CM

## 2015-08-01 LAB — ECHOCARDIOGRAM COMPLETE
CHL CUP MV DEC (S): 232
E decel time: 232 msec
E/e' ratio: 14.86
FS: 17 % — AB (ref 28–44)
HEIGHTINCHES: 74 in
IV/PV OW: 1.02
LADIAMINDEX: 1.4 cm/m2
LASIZE: 40 mm
LAVOL: 75.2 mL
LAVOLA4C: 64.3 mL
LAVOLIN: 26.2 mL/m2
LDCA: 4.52 cm2
LEFT ATRIUM END SYS DIAM: 40 mm
LV E/e' medial: 14.86
LV E/e'average: 14.86
LV PW d: 8.84 mm — AB (ref 0.6–1.1)
LVELAT: 7.94 cm/s
LVOT diameter: 24 mm
MV Peak grad: 6 mmHg
MV pk E vel: 118 m/s
MVPKAVEL: 27.6 m/s
RV LATERAL S' VELOCITY: 10.3 cm/s
TAPSE: 23.8 mm
TDI e' lateral: 7.94
TDI e' medial: 7.18
WEIGHTICAEL: 5299.2 [oz_av]

## 2015-08-01 LAB — CBC
HCT: 40 % (ref 39.0–52.0)
HEMOGLOBIN: 12.7 g/dL — AB (ref 13.0–17.0)
MCH: 28.3 pg (ref 26.0–34.0)
MCHC: 31.8 g/dL (ref 30.0–36.0)
MCV: 89.1 fL (ref 78.0–100.0)
Platelets: 210 10*3/uL (ref 150–400)
RBC: 4.49 MIL/uL (ref 4.22–5.81)
RDW: 12.6 % (ref 11.5–15.5)
WBC: 7.9 10*3/uL (ref 4.0–10.5)

## 2015-08-01 MED ORDER — PERFLUTREN LIPID MICROSPHERE
1.0000 mL | INTRAVENOUS | Status: AC | PRN
  Administered 2015-08-01: 4 mL via INTRAVENOUS
  Filled 2015-08-01: qty 10

## 2015-08-01 MED ORDER — CARVEDILOL 3.125 MG PO TABS
3.1250 mg | ORAL_TABLET | Freq: Two times a day (BID) | ORAL | Status: DC
  Administered 2015-08-01: 3.125 mg via ORAL
  Filled 2015-08-01 (×5): qty 1

## 2015-08-01 MED ORDER — LISINOPRIL 2.5 MG PO TABS
2.5000 mg | ORAL_TABLET | Freq: Every day | ORAL | Status: DC
  Administered 2015-08-01 – 2015-08-02 (×2): 2.5 mg via ORAL
  Filled 2015-08-01 (×2): qty 1

## 2015-08-01 NOTE — Progress Notes (Signed)
Patient Name: Matthew Sexton Date of Encounter: 08/01/2015  Principal Problem:   Unstable angina [facility]) Active Problems:   Morbid obesity (HCC)   Mixed dyslipidemia   DM type 2 with diabetic dyslipidemia [facility])   Primary Cardiologist: New - Dr. Wyline Mood Patient Profile: Matthew Sexton is a 45 y.o.male with no prior history of coronary artery disease, but family history of early onset CAD in his father,brother and sister, presented to the ER with complaints of chest pain. He has history of diabetes but is not taking medication. He has been feeling much more tired, less energy, fatigue and chest discomfort with minimal exertion. Left heart cath done, no obstructive CAD. Elevated LVEDP.   SUBJECTIVE: Does not feel well, reports exertional perfuse diaphoresis. Still with mild chest pain.    OBJECTIVE Filed Vitals:   07/31/15 1845 07/31/15 1957 07/31/15 2202 08/01/15 0500  BP: 121/69 98/50 113/61 111/63  Pulse: 64 67 72 56  Temp:  98.1 F (36.7 C)  97.5 F (36.4 C)  TempSrc:  Oral  Oral  Resp:  20  20  Height:      Weight:    331 lb 3.2 oz (150.231 kg)  SpO2: 97% 97%  98%    Intake/Output Summary (Last 24 hours) at 08/01/15 1056 Last data filed at 08/01/15 0900  Gross per 24 hour  Intake 1218.1 ml  Output   3500 ml  Net -2281.9 ml   Filed Weights   07/30/15 0944 08/01/15 0500  Weight: 320 lb (145.151 kg) 331 lb 3.2 oz (150.231 kg)    PHYSICAL EXAM General: Well developed, well nourished,obese male in no acute distress. Head: Normocephalic, atraumatic.  Neck: Supple without bruits, No JVD. Lungs:  Resp regular and unlabored, CTA. Heart: RRR, S1, S2, no S3, S4, or murmur; no rub. Abdomen: Soft, non-tender, non-distended, BS + x 4.  Extremities: No clubbing, cyanosis, generalized edema.  Neuro: Alert and oriented X 3. Moves all extremities spontaneously. Psych: Normal affect.  LABS: CBC: Recent Labs  07/31/15 1713 08/01/15 0438  WBC 8.4 7.9  HGB 13.5 12.7*  HCT 40.6  40.0  MCV 90.0 89.1  PLT 201 210   INR: Recent Labs  07/31/15 1117  INR 1.14   Basic Metabolic Panel: Recent Labs  07/30/15 0957 07/31/15 0428 07/31/15 1713  NA 138 136  --   K 4.3 4.2  --   CL 106 105  --   CO2 25 26  --   GLUCOSE 188* 186*  --   BUN 19 24*  --   CREATININE 0.93 0.87 0.92  CALCIUM 9.0 8.4*  --    Cardiac Enzymes: Recent Labs  07/30/15 1630 07/30/15 2301 07/31/15 0428  TROPONINI 0.04* 0.04* 0.03    Recent Labs  07/30/15 0949  TROPIPOC 0.01   D-dimer: Recent Labs  07/30/15 0957  DDIMER <0.27   Hemoglobin A1C: Recent Labs  07/30/15 0945  HGBA1C 8.8*   Fasting Lipid Panel: Recent Labs  07/31/15 0428  CHOL 157  HDL 30*  LDLCALC UNABLE TO CALCULATE IF TRIGLYCERIDE OVER 400 mg/dL  TRIG 098*  CHOLHDL 5.2   Thyroid Function Tests: Recent Labs  07/30/15 0945  TSH 0.517    Current facility-administered medications:  .  0.9 %  sodium chloride infusion, 250 mL, Intravenous, PRN, Shon Hale, MD, Last Rate: 125 mL/hr at 07/31/15 1556, 125 mL/hr at 07/31/15 1556 .  0.9 %  sodium chloride infusion, 250 mL, Intravenous, PRN, Marykay Lex, MD .  acetaminophen (  TYLENOL) tablet 650 mg, 650 mg, Oral, Q6H PRN, 650 mg at 08/01/15 0542 **OR** acetaminophen (TYLENOL) suppository 650 mg, 650 mg, Rectal, Q6H PRN, Courage Emokpae, MD .  albuterol (PROVENTIL) (2.5 MG/3ML) 0.083% nebulizer solution 2.5 mg, 2.5 mg, Nebulization, Q2H PRN, Shon Hale, MD .  aspirin EC tablet 81 mg, 81 mg, Oral, Daily, Shon Hale, MD, 81 mg at 08/01/15 0850 .  atorvastatin (LIPITOR) tablet 40 mg, 40 mg, Oral, q1800, Shon Hale, MD, 40 mg at 07/31/15 1812 .  metoprolol tartrate (LOPRESSOR) tablet 25 mg, 25 mg, Oral, BID, Antoine Poche, MD, 25 mg at 08/01/15 0850 .  morphine 2 MG/ML injection 2 mg, 2 mg, Intravenous, Q3H PRN, Shon Hale, MD, 2 mg at 07/31/15 1846 .  nitroGLYCERIN (NITROSTAT) SL tablet 0.4 mg, 0.4 mg, Sublingual, Q5 min PRN,  Loren Racer, MD .  ondansetron [facility]) tablet 4 mg, 4 mg, Oral, Q6H PRN **OR** ondansetron (ZOFRAN) injection 4 mg, 4 mg, Intravenous, Q6H PRN, Shon Hale, MD .  oxyCODONE (Oxy IR/ROXICODONE) immediate release tablet 5 mg, 5 mg, Oral, Q4H PRN, Courage Emokpae, MD .  polyethylene glycol (MIRALAX / GLYCOLAX) packet 17 g, 17 g, Oral, Daily PRN, Shon Hale, MD .  senna (SENOKOT) tablet 8.6 mg, 1 tablet, Oral, BID, Shon Hale, MD, 8.6 mg at 08/01/15 0850 .  sodium chloride flush (NS) 0.9 % injection 3 mL, 3 mL, Intravenous, Q12H, Shon Hale, MD, 3 mL at 08/01/15 1000 .  sodium chloride flush (NS) 0.9 % injection 3 mL, 3 mL, Intravenous, PRN, Shon Hale, MD .  sodium chloride flush (NS) 0.9 % injection 3 mL, 3 mL, Intravenous, Q12H, Marykay Lex, MD, 3 mL at 08/01/15 1000 .  sodium chloride flush (NS) 0.9 % injection 3 mL, 3 mL, Intravenous, PRN, Marykay Lex, MD .  traZODone (DESYREL) tablet 50 mg, 50 mg, Oral, QHS PRN, Shon Hale, MD   TELE: NSR     Left Heart Cath and Coronary Angiography 1. 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 2. There is moderate left ventricular systolic dysfunction. 3. Severely elevated LVEDP.  Angiographic normal coronary arteries. Suspect that his unstable angina symptoms may very well be related to elevated LVEDP with cardiomyopathy.  Plan:  Return to nursing unit for ongoing care.  TR band removal per current protocol.  No further heparin as was no active coronary disease.  Will give 1 dose of IV Lasix this evening for elevated LVEDP. Within check 2-D echocardiogram in the morning to reassess function and filling pressures.  We'll need to titrate cardiac medications prior to discharge.    Current Medications:  . aspirin EC  81 mg Oral Daily  . atorvastatin  40 mg Oral q1800  . metoprolol tartrate  25 mg Oral BID  . senna  1 tablet Oral BID  . sodium chloride flush  3 mL Intravenous Q12H  . sodium chloride flush  3 mL  Intravenous Q12H      ASSESSMENT AND PLAN: Principal Problem:   Unstable angina (HCC) Active Problems:   Morbid obesity (HCC)   Mixed dyslipidemia   DM type 2 with diabetic dyslipidemia (HCC)  1.Chest Pain: Presented with symptoms typical for angina. CVRF diabetes, obesity, strong family history of early CAD. Symptoms progressive with associated weakness, diaphoresis, fatigue and dyspnea. Left heart cath done yesterday, report above. No obstructive CAD. Will continue medical therapy with beta blocker and daily ASA. EF by cath was 35%, will switch metoprolol to Coreg. Will add ACE, benefit with renal protection  in setting of DM. Awaiting Echo to be done today.   2. Diabetes: He has been on metformin in the past but stopped taking it a year ago. Was exercising by walking 5 miles a day but stopped. A1C was 8.8. Needs to go back on Metformin.   3. Obesity: Weight loss recommended.   4. Night sweats: patient reports profuse night sweats. He is overweight and has some daytime somnolence. Would benefit from sleep study outpatient.     Signed, Little Ishikawa , NP 10:56 AM 08/01/2015 Pager [phone]  Patient seen and examined. Agree with assessment and plan. I had a long discussion with patient and family. Angios reviewed. Nonischemic cardiomyopathy. Will change metoprolol to carvedilol today and start ACE-I with lisinopril today. He may benefit from aldosterone blockade with spironolactone, but will not start today.  Ultimately if EF confirmed on echo pt may be a candidate for entresto with additional survival benefit. With significant sweats, will check thyroid studies.  Pt is morbidly obese with nonrestorative sleep, snoring, frequent awakenings and has a high likelihood of sleep apnea. I reviewed at length the adverse consequences of untreated sleep apnea and in particular the effect of cardiovascular health. Will also arrange for a sleep study as an outpatient. For echo today.    [name], MD, [facility] 08/01/2015 12:30 PM

## 2015-08-01 NOTE — Care Management Note (Signed)
Case Management Note  Patient Details  Name: Matthew Sexton MRN: [id number] Date of Birth: 06/20/1970  Subjective/Objective:   Pt presented with chest pain. Post cardiac cath. Plan will be to adjust medications.                  Action/Plan: No needs identified by CM at this time.    Expected Discharge Date:                  Expected Discharge Plan:  Home/Self Care  In-House Referral:  NA  Discharge planning Services  CM Consult  Post Acute Care Choice:  NA Choice offered to:  NA  DME Arranged:  N/A DME Agency:  NA  HH Arranged:  NA HH Agency:  NA  Status of Service:  Completed, signed off  If discussed at Long Length of Stay Meetings, dates discussed:    Additional Comments:  Gala Lewandowsky, RN 08/01/2015, 2:02 PM

## 2015-08-01 NOTE — Progress Notes (Signed)
  Echocardiogram 2D Echocardiogram has been performed.  Matthew Sexton 08/01/2015, 12:59 PM

## 2015-08-02 DIAGNOSIS — E782 Mixed hyperlipidemia: Secondary | ICD-10-CM

## 2015-08-02 LAB — CBC
HCT: 39 % (ref 39.0–52.0)
Hemoglobin: 12.5 g/dL — ABNORMAL LOW (ref 13.0–17.0)
MCH: 28.3 pg (ref 26.0–34.0)
MCHC: 32.1 g/dL (ref 30.0–36.0)
MCV: 88.4 fL (ref 78.0–100.0)
PLATELETS: 203 10*3/uL (ref 150–400)
RBC: 4.41 MIL/uL (ref 4.22–5.81)
RDW: 12.5 % (ref 11.5–15.5)
WBC: 6.9 10*3/uL (ref 4.0–10.5)

## 2015-08-02 LAB — GLUCOSE, CAPILLARY
GLUCOSE-CAPILLARY: 213 mg/dL — AB (ref 65–99)
GLUCOSE-CAPILLARY: 144 mg/dL — AB (ref 65–99)
GLUCOSE-CAPILLARY: 203 mg/dL — AB (ref 65–99)

## 2015-08-02 LAB — BRAIN NATRIURETIC PEPTIDE: B Natriuretic Peptide: 78.5 pg/mL (ref 0.0–100.0)

## 2015-08-02 MED ORDER — LISINOPRIL 5 MG PO TABS
5.0000 mg | ORAL_TABLET | Freq: Every day | ORAL | Status: DC
  Administered 2015-08-03: 5 mg via ORAL
  Filled 2015-08-02 (×2): qty 1

## 2015-08-02 MED ORDER — OMEGA-3-ACID ETHYL ESTERS 1 G PO CAPS
2.0000 g | ORAL_CAPSULE | Freq: Two times a day (BID) | ORAL | Status: DC
  Administered 2015-08-02 – 2015-08-03 (×3): 2 g via ORAL
  Filled 2015-08-02 (×3): qty 2

## 2015-08-02 MED ORDER — INSULIN ASPART 100 UNIT/ML ~~LOC~~ SOLN
0.0000 [IU] | Freq: Three times a day (TID) | SUBCUTANEOUS | Status: DC
  Administered 2015-08-02: 5 [IU] via SUBCUTANEOUS
  Administered 2015-08-02: 2 [IU] via SUBCUTANEOUS
  Administered 2015-08-03 (×2): 3 [IU] via SUBCUTANEOUS

## 2015-08-02 MED ORDER — FENOFIBRATE 160 MG PO TABS
160.0000 mg | ORAL_TABLET | Freq: Every day | ORAL | Status: DC
  Administered 2015-08-02 – 2015-08-03 (×2): 160 mg via ORAL
  Filled 2015-08-02 (×2): qty 1

## 2015-08-02 MED ORDER — SPIRONOLACTONE 25 MG PO TABS
12.5000 mg | ORAL_TABLET | Freq: Every day | ORAL | Status: DC
  Administered 2015-08-02 – 2015-08-03 (×2): 12.5 mg via ORAL
  Filled 2015-08-02 (×2): qty 1

## 2015-08-02 NOTE — Progress Notes (Signed)
Spoke with pt and pts wife, pt ambulated in hall and stated he had a moment of chest pain which quickly relieved. States he continues to feel "swimmy headed" and "at times needs to take a break or sit down" because of the symptoms.

## 2015-08-02 NOTE — Progress Notes (Addendum)
Inpatient Diabetes Program Recommendations  AACE/ADA: New Consensus Statement on Inpatient Glycemic Control (2015)  Target Ranges:  Prepandial:   less than 140 mg/dL      Peak postprandial:   less than 180 mg/dL (1-2 hours)      Critically ill patients:  140 - 180 mg/dL   Results for Matthew Sexton, ESPARZA (MRN [id number]) as of 07/31/2015 10:16  Ref. Range 07/30/2015 09:45  Hemoglobin A1C Latest Ref Range: 4.8-5.6 % 8.8 (H)       Admit with: CP  History: DM2  Home DM Meds: None- Diet controlled  Current Insulin Orders: None     MD- Please consider the following:  1. Start Novolog Sensitive Correction Scale/ SSI (0-9 units) TID AC + HS to help control glucose levels in hospital  2. Patient likely would benefit from Oral DM medication at time of discharge. Current A1c= 8.8%. Per Cardiology notes, patient was taking Metformin at home but stopped this medication about 1 year ago. Please consider restarting Metformin at time of discharge. Metformin is generic and can be purchased at [employer] for $4.    --Will follow patient during hospitalization--  Ambrose Finland RN, MSN, CDE Diabetes Coordinator Inpatient Glycemic Control Team Team Pager: [phone] (8a-5p)

## 2015-08-02 NOTE — Progress Notes (Signed)
Pt ambulated in hall to [facility]rium reported 4/10 chest pain while walking, described it as a pressure like sensation, now with rest easing off down to a 2/10. Tylenol given. Cardiology made aware.

## 2015-08-02 NOTE — Plan of Care (Signed)
Problem: Activity: Goal: Capacity to carry out activities will improve Outcome: Progressing Reviewed with pt and pts wife heart failure booklet. Went over daily weights, sodium restriction, fluid restriction and moving. Awaiting BNP results. Reviewed new medications. Ordered dietitian to go over heart healthy/diabetic foods. Pt has no complaints at this time. Will continue to monitor.

## 2015-08-02 NOTE — Progress Notes (Signed)
Coreg 3.125 BID not given today, HR continues to stay mid 50s, currently 56. VSS. Pt has no complaints. Insulin sliding scale started.

## 2015-08-02 NOTE — Progress Notes (Signed)
Patient Name: Matthew Sexton Date of Encounter: 08/02/2015   SUBJECTIVE  Had 4/10 chest pressure earlier this morning with walking, currently 2/10. Less intense than initial presentation. Fatigue and tired.   CURRENT MEDS . aspirin EC  81 mg Oral Daily  . atorvastatin  40 mg Oral q1800  . carvedilol  3.125 mg Oral BID WC  . insulin aspart  0-15 Units Subcutaneous TID WC  . lisinopril  2.5 mg Oral Daily  . senna  1 tablet Oral BID  . sodium chloride flush  3 mL Intravenous Q12H  . sodium chloride flush  3 mL Intravenous Q12H    OBJECTIVE  Filed Vitals:   08/01/15 1300 08/01/15 1349 08/01/15 2202 08/02/15 0600  BP: 132/83 124/70 123/82 125/83  Pulse:  55    Temp: 98 F (36.7 C) 98.4 F (36.9 C) 98.2 F (36.8 C) 98 F (36.7 C)  TempSrc: Oral Oral Oral Oral  Resp:      Height:      Weight:    329 lb 8 oz (149.46 kg)  SpO2: 98% 98% 97% 98%    Intake/Output Summary (Last 24 hours) at 08/02/15 0854 Last data filed at 08/02/15 0643  Gross per 24 hour  Intake    890 ml  Output   1925 ml  Net  -1035 ml   Filed Weights   07/30/15 0944 08/01/15 0500 08/02/15 0600  Weight: 320 lb (145.151 kg) 331 lb 3.2 oz (150.231 kg) 329 lb 8 oz (149.46 kg)    PHYSICAL EXAM  General: Pleasant, obese male in  NAD. Neuro: Alert and oriented X 3. Moves all extremities spontaneously. Psych: Normal affect. HEENT:  Normal  Neck: Supple without bruits or JVD. Lungs:  Resp regular and unlabored, CTA. Heart: RRR no s3, s4, or murmurs. Abdomen: Soft, non-tender, non-distended, BS + x 4.  Extremities: No clubbing, cyanosis or edema. DP/PT/Radials 2+ and equal bilaterally. R radial cath site without hematoma or bruit.   Accessory Clinical Findings  CBC  Recent Labs  08/01/15 0438 08/02/15 0413  WBC 7.9 6.9  HGB 12.7* 12.5*  HCT 40.0 39.0  MCV 89.1 88.4  PLT 210 203   Basic Metabolic Panel  Recent Labs  07/30/15 0957 07/31/15 0428 07/31/15 1713  NA 138 136  --   K 4.3 4.2  --    CL 106 105  --   CO2 25 26  --   GLUCOSE 188* 186*  --   BUN 19 24*  --   CREATININE 0.93 0.87 0.92  CALCIUM 9.0 8.4*  --    Liver Function Tests No results for input(s): AST, ALT, ALKPHOS, BILITOT, PROT, ALBUMIN in the last 72 hours. No results for input(s): LIPASE, AMYLASE in the last 72 hours. Cardiac Enzymes  Recent Labs  07/30/15 1630 07/30/15 2301 07/31/15 0428  TROPONINI 0.04* 0.04* 0.03   BNP Invalid input(s): POCBNP D-Dimer  Recent Labs  07/30/15 0957  DDIMER <0.27   Hemoglobin A1C  Recent Labs  07/30/15 0945  HGBA1C 8.8*   Fasting Lipid Panel  Recent Labs  07/31/15 0428  CHOL 157  HDL 30*  LDLCALC UNABLE TO CALCULATE IF TRIGLYCERIDE OVER 400 mg/dL  TRIG 161*  CHOLHDL 5.2   Thyroid Function Tests  Recent Labs  07/30/15 0945  TSH 0.517   BNP (last 3 results) No results for input(s): BNP in the last 8760 hours.  ProBNP (last 3 results) No results for input(s): PROBNP in the last 8760 hours.   TELE  Sinus rhythm at rate of high 40s to low 50s.   Echo 08/01/15 LV EF: 30% - 35%  ------------------------------------------------------------------- History: PMH: Cardiomyopathy. Coronary artery disease.  ------------------------------------------------------------------- Study Conclusions  - Procedure narrative: Transthoracic echocardiography. Image  quality was adequate. The study was technically difficult, as a  result of body habitus. Definity contrast administered. - Left ventricle: The cavity size was normal. Wall thickness was  normal. Systolic function was moderately to severely reduced. The  estimated ejection fraction was in the range of 30% to 35%. No  mural thrombus seen with Definity contrast. Diffuse hypokinesis.  Doppler parameters are consistent with pseudonormal left  ventricular relaxation (grade 2 diastolic dysfunction). The E/A  ratio is >1.5. The E/e&' ratio is >20, suggesting markedly  elevated  LV filling pressure. - Left atrium: The atrium was normal in size. - Tricuspid valve: There was no significant regurgitation. - Inferior vena cava: The vessel was normal in size. The  respirophasic diameter changes were in the normal range (>= 50%),  consistent with normal central venous pressure.  Impressions:  - Technically difficult study. Definity contrast given. LVEF  30-35%, global hypokinesis, grade 2 DD with markedly elevated LV  filling pressures, normal LA size, normal IVC.  Radiology/Studies  Dg Chest 2 View  07/30/2015  CLINICAL DATA:  Left-sided chest pain this morning. Shortness of breath. Former smoker. EXAM: CHEST  2 VIEW COMPARISON:  None. FINDINGS: The cardiomediastinal silhouette is within normal limits. The lungs are mildly hypoinflated. There is mild central airway thickening. No segmental airspace consolidation, overt pulmonary edema, pleural effusion, or pneumothorax is identified. No acute osseous abnormality is seen. IMPRESSION: Mild bronchitic changes. Electronically Signed   By: Sebastian Ache M.D.   On: 07/30/2015 10:23    ASSESSMENT AND PLAN  1. Unstable angina (HCC) - Troponin flat low trend. Cath showed normal coronaries with 35% mid RCA, elevated LVEDP. EF of 35% on cath. Given IV lasix x 1. Chest pressure with ambulation this morning. He does not wants to start any new medication currently. Wants to discuss with Dr. Tresa Endo. Consider adding Imdur.   2. Non ischemic cardiomyopathy - Echo showed EF of 30-35%, global hypokinesis, grade 2 DD with markedly elevated LV filling pressures, normal LA size, normal IVC. Cath with normal coronaries.  - Continue coreg, lisinopril, statin and aspirin. Will discuss with MD regarding adding diuretics and imdur --> the patient prefers discussion with MD first. ? Entresto.   3. DM - On SSI. At discharge will start metformin 1000mg  qd.   4. HL - 07/31/2015: Cholesterol 157; HDL 30*; LDL Cholesterol UNABLE TO CALCULATE  IF TRIGLYCERIDE OVER 400 mg/dL; Triglycerides 415*; VLDL UNABLE TO CALCULATE IF TRIGLYCERIDE OVER 400 mg/dL  - Continue lipitor 40mg . Consider adding Lovaza or fibrates. Check LFT and lipid panel in 6 weeks.   5. Morbid obesity (HCC) - Encouraged life style changes. Weight loss.   6. Possible sleep apnea - Outpatient sleep study     SignedManson Passey PA-C Pager [phone]   Patient seen and examined. Agree with assessment and plan. No epicardial CAD at cath.  Echo confirms EF 30 - 35%. No thrombus. Started on low dose coreg at 3.125 bid and lisinopril yesterday. Will increase lisinopil to 5 mg and will need further titration. Will add low dose spironolactone at 12.5 mg daily. TG>400, will add lovaza or vascepa 2 gm bid and fenofibrate 145 mg daily. Will arrange for outpatient sleep study.  Will keep today; f/u bmet, bnp in am and probable  dc tomorrow.    [name], MD, [facility] 08/02/2015 12:10 PM

## 2015-08-03 ENCOUNTER — Other Ambulatory Visit: Payer: Self-pay | Admitting: *Deleted

## 2015-08-03 ENCOUNTER — Encounter: Payer: Self-pay | Admitting: Physician Assistant

## 2015-08-03 DIAGNOSIS — G478 Other sleep disorders: Secondary | ICD-10-CM

## 2015-08-03 LAB — BASIC METABOLIC PANEL
ANION GAP: 8 (ref 5–15)
BUN: 17 mg/dL (ref 6–20)
CALCIUM: 9.4 mg/dL (ref 8.9–10.3)
CHLORIDE: 105 mmol/L (ref 101–111)
CO2: 25 mmol/L (ref 22–32)
Creatinine, Ser: 1 mg/dL (ref 0.61–1.24)
GFR calc non Af Amer: >60 mL/min (ref >60)
GLUCOSE: 155 mg/dL — AB (ref 65–99)
POTASSIUM: 4.5 mmol/L (ref 3.5–5.1)
Sodium: 138 mmol/L (ref 135–145)

## 2015-08-03 LAB — CBC
HEMATOCRIT: 43.1 % (ref 39.0–52.0)
HEMOGLOBIN: 14 g/dL (ref 13.0–17.0)
MCH: 29.4 pg (ref 26.0–34.0)
MCHC: 32.5 g/dL (ref 30.0–36.0)
MCV: 90.5 fL (ref 78.0–100.0)
Platelets: 217 10*3/uL (ref 150–400)
RBC: 4.76 MIL/uL (ref 4.22–5.81)
RDW: 12.4 % (ref 11.5–15.5)
WBC: 7.6 10*3/uL (ref 4.0–10.5)

## 2015-08-03 LAB — GLUCOSE, CAPILLARY
GLUCOSE-CAPILLARY: 181 mg/dL — AB (ref 65–99)
Glucose-Capillary: 189 mg/dL — ABNORMAL HIGH (ref 65–99)

## 2015-08-03 LAB — MAGNESIUM: Magnesium: 1.9 mg/dL (ref 1.7–2.4)

## 2015-08-03 MED ORDER — OMEGA-3-ACID ETHYL ESTERS 1 G PO CAPS: 2.0000 g | ORAL_CAPSULE | Freq: Two times a day (BID) | ORAL | Status: AC

## 2015-08-03 MED ORDER — ATORVASTATIN CALCIUM 40 MG PO TABS: 40.0000 mg | ORAL_TABLET | Freq: Every day | ORAL | Status: DC

## 2015-08-03 MED ORDER — LISINOPRIL 10 MG PO TABS: 10.0000 mg | ORAL_TABLET | Freq: Every day | ORAL | Status: DC

## 2015-08-03 MED ORDER — CARVEDILOL 3.125 MG PO TABS: 1.5600 mg | ORAL_TABLET | Freq: Two times a day (BID) | ORAL | Status: DC

## 2015-08-03 MED ORDER — ASPIRIN 81 MG PO TBEC: 81.0000 mg | DELAYED_RELEASE_TABLET | Freq: Every day | ORAL | Status: AC

## 2015-08-03 MED ORDER — METFORMIN HCL 1000 MG PO TABS: 1000.0000 mg | ORAL_TABLET | Freq: Every day | ORAL | Status: AC

## 2015-08-03 MED ORDER — FENOFIBRATE 160 MG PO TABS: 160.0000 mg | ORAL_TABLET | Freq: Every day | ORAL | Status: DC

## 2015-08-03 MED ORDER — NITROGLYCERIN 0.4 MG SL SUBL: 0.4000 mg | SUBLINGUAL_TABLET | SUBLINGUAL | Status: DC | PRN

## 2015-08-03 NOTE — Progress Notes (Signed)
Nutrition Education Note  RD consulted for nutrition education regarding a Heart Healthy, CHO modified diet. Patient reports that he had lost a lot of weight last year, down to 280 lbs from 360 lbs; he has gained a lot of the weight back due to working 2 jobs and not eating healthy foods while on the go.   HGB A1C MFR BLD  Date/Time Value Ref Range Status  07/30/2015 09:45 AM 8.8* 4.8 - 5.6 % Final    Comment:    (NOTE)         Pre-diabetes: 5.7 - 6.4         Diabetes: >6.4         Glycemic control for adults with diabetes: <7.0     Lipid Panel     Component Value Date/Time   CHOL 157 07/31/2015 0428   TRIG 415* 07/31/2015 0428   HDL 30* 07/31/2015 0428   CHOLHDL 5.2 07/31/2015 0428   VLDL UNABLE TO CALCULATE IF TRIGLYCERIDE OVER 400 mg/dL 16/11/9602 5409   LDLCALC UNABLE TO CALCULATE IF TRIGLYCERIDE OVER 400 mg/dL 81/19/1478 2956    RD provided "Heart Healthy Cooking Tips" and "Nutrition for Type 2 Diabetes" handouts from the Academy of Nutrition and Dietetics. Reviewed patient's dietary recall. Provided examples on ways to decrease sodium and fat intake in diet. Discouraged intake of processed foods and use of salt shaker. Encouraged fresh fruits and vegetables as well as whole grain sources of carbohydrates to maximize fiber intake. Teach back method used.  Expect fair compliance.  Body mass index is 42.21 kg/(m^2). Pt meets criteria for class 3, extreme/morbid obesity based on current BMI.  Current diet order is heart healthy CHO modified, patient is consuming approximately 100% of meals at this time. Labs and medications reviewed. No further nutrition interventions warranted at this time. RD contact information provided. If additional nutrition issues arise, please re-consult RD. Plans for d/c home today.  Joaquin Courts, RD, LDN, CNSC Pager [phone] After Hours Pager [phone]

## 2015-08-03 NOTE — Discharge Instructions (Signed)
Cardiomyopathy Cardiomyopathy is a long-term (chronic) disease of the heart muscle (myocardium). Over time, the heart becomes abnormally large, thick, or stiff. This makes it harder for the heart to pump blood and can lead to heart failure. There are several types of cardiomyopathy:  Dilated cardiomyopathy. This type causes the ventricles become large and weak.  Hypertrophic cardiomyopathy. This type causes the heart muscle to thicken.  Restrictive cardiomyopathy. This type causes the heart muscle to become rigid and less elastic.  Ischemic cardiomyopathy. This type involves narrowing arteries that cause the walls of the heart get thinner.  Peripartum cardiomyopathy. This type occurs during pregnancy or shortly after pregnancy. CAUSES The cause of cardiomyopathy is often not known. In some cases, it is passed down (inherited) from a family member who also had cardiomyopathy. The disease may develop as a complication of another medical condition. These conditions can include:  Diabetes.  High blood pressure.  Viral infection of the heart.  Heart attack.  Coronary heart disease. RISK FACTORS You may be more likely to develop cardiomyopathy if you:  Have a family history of cardiomyopathy or other heart problems.  Are overweight or obese.  Use illegal drugs.  Abuse alcohol.  Have diabetes.  Have another disease that can cause cardiomyopathy as a complication. SIGNS AND SYMPTOMS Often, cardiomyopathy has no signs or symptoms. If you do have symptoms, they may include:  Shortness of breath, especially during activity.  Fatigue.  An irregular heartbeat (arrhythmia).  Dizziness, light-headedness, or fainting.  Chest pain.  Swelling in the lower leg or ankle. DIAGNOSIS Your health care provider may suspect cardiomyopathy based on your symptoms and medical history. Your health care provider will also do a physical exam. Other tests done may include:  Blood  tests.  Imaging studies of your heart. These may be done using:  X-rays to check if your heart is enlarged.  Echocardiogram to show the size of your heart and how well it pumps.  MRI.  A test to record the electrical activity of your heart (electrocardiogram or ECG).  A test in which you wear a portable device (event monitor) to record your heart's electrical activity while you go about your day.  A test to monitor your heart's activity while you exercise (stress test).   A procedure to check the blood pressure and blood flow in your heart(cardiac catheterization).  Injection of dye into your arteries before imaging studies are taken (angiogram).  Removal of a sample of heart tissue (biopsy). The sample is examined for problems. TREATMENT Treatment depends on the type of cardiomyopathy you have and the severity of your symptoms. If you are not having any symptoms, you might not need treatment. If you need treatment, it may include:  Lifestyle changes.  Quit smoking, if you smoke.  Maintain a healthy weight. Lose weight if directed by your health care provider.  Eat a healthy diet. Include plenty of fruits, vegetables, and whole grains.  Get regular exercise. Ask your health care provider to suggest some activities that are good for you.  Medicine. You may need to take medicine to:  Lower your blood pressure.  Slow down your heart rate.  Keep your heart beating in a steady rhythm.  Clear excess fluids from your body.  Prevent blood clots.  Surgery. You may need surgery to:  Repair a defect.  Remove thickened tissue.  Implant a device to treat serious heart rhythm problems (implantable cardioverter-defibrillator or ICD).  Replace your heart (heart transplant) if all other treatments  have failed (end stage). HOME CARE INSTRUCTIONS  Take medicines only as directed by your health care provider.  Eat a heart-healthy diet. Work with your health care provider or a  registered dietitian to learn about healthy eating options.  Maintain a healthy weight and stay physically active.  Do not use any tobacco products, including cigarettes, chewing tobacco, or electronic cigarettes. If you need help quitting, ask your health care provider.  Work closely with your health care provider to manage chronic conditions, such as diabetes and high blood pressure.  Limit alcohol intake to no more than one drink per day for nonpregnant women and no more than two drinks per day for men. One drink equals 12 ounces of beer, 5 ounces of wine, or 1 ounces of hard liquor.  Try to get at least 7 hours of sleep each night.  Find ways to manage stress.  Keep all follow-up visits as directed by your health care provider. This is important. SEEK MEDICAL CARE IF:  Your symptoms get worse, even after treatment.  You have new symptoms. SEEK IMMEDIATE MEDICAL CARE IF:  You have severe chest pain.  You have shortness of breath.  You cough up pink, bubbly material.  You have sudden sweating.  You feel nauseous and vomit.  You suddenly become light-headed or dizzy.  You feel your heart beating very fast.  It feels like your heart is skipping beats. These symptoms may represent a serious problem that is an emergency. Do not wait to see if the symptoms will go away. Get medical help right away. Call your local emergency services (911 in the [place] Do not drive yourself to the hospital.   This information is not intended to replace advice given to you by your health care provider. Make sure you discuss any questions you have with your health care provider.   Document Released: 04/05/2004 Document Revised: 02/11/2014 Document Reviewed: 07/01/2013 Elsevier Interactive Patient Education 2016 Elsevier Inc[employer] Heart-Healthy Eating Plan Many factors influence your heart health, including eating and exercise habits. Heart (coronary) risk increases with abnormal blood fat (lipid)  levels. Heart-healthy meal planning includes limiting unhealthy fats, increasing healthy fats, and making other small dietary changes. This includes maintaining a healthy body weight to help keep lipid levels within a normal range. WHAT IS MY PLAN?  Your health care provider recommends that you: Get no more than _________% of the total calories in your daily diet from fat. Limit your intake of saturated fat to less than _________% of your total calories each day. Limit the amount of cholesterol in your diet to less than _________ mg per day. WHAT TYPES OF FAT SHOULD I CHOOSE? Choose healthy fats more often. Choose monounsaturated and polyunsaturated fats, such as olive oil and canola oil, flaxseeds, walnuts, almonds, and seeds. Eat more omega-3 fats. Good choices include salmon, mackerel, sardines, tuna, flaxseed oil, and ground flaxseeds. Aim to eat fish at least two times each week. Limit saturated fats. Saturated fats are primarily found in animal products, such as meats, butter, and cream. Plant sources of saturated fats include palm oil, palm kernel oil, and coconut oil. Avoid foods with partially hydrogenated oils in them. These contain trans fats. Examples of foods that contain trans fats are stick margarine, some tub margarines, cookies, crackers, and other baked goods. WHAT GENERAL GUIDELINES DO I NEED TO FOLLOW? Check food labels carefully to identify foods with trans fats or high amounts of saturated fat. Fill one half of your plate with vegetables and  green salads. Eat 4-5 servings of vegetables per day. A serving of vegetables equals 1 cup of raw leafy vegetables,  cup of raw or cooked cut-up vegetables, or  cup of vegetable juice. Fill one fourth of your plate with whole grains. Look for the word "whole" as the first word in the ingredient list. Fill one fourth of your plate with lean protein foods. Eat 4-5 servings of fruit per day. A serving of fruit equals one medium whole fruit,   cup of dried fruit,  cup of fresh, frozen, or canned fruit, or  cup of 100% fruit juice. Eat more foods that contain soluble fiber. Examples of foods that contain this type of fiber are apples, broccoli, carrots, beans, peas, and barley. Aim to get 20-30 g of fiber per day. Eat more home-cooked food and less restaurant, buffet, and fast food. Limit or avoid alcohol. Limit foods that are high in starch and sugar. Avoid fried foods. Cook foods by using methods other than frying. Baking, boiling, grilling, and broiling are all great options. Other fat-reducing suggestions include: Removing the skin from poultry. Removing all visible fats from meats. Skimming the fat off of stews, soups, and gravies before serving them. Steaming vegetables in water or broth. Lose weight if you are overweight. Losing just 5-10% of your initial body weight can help your overall health and prevent diseases such as diabetes and heart disease. Increase your consumption of nuts, legumes, and seeds to 4-5 servings per week. One serving of dried beans or legumes equals  cup after being cooked, one serving of nuts equals 1 ounces, and one serving of seeds equals  ounce or 1 tablespoon. You may need to monitor your salt (sodium) intake, especially if you have high blood pressure. Talk with your health care provider or dietitian to get more information about reducing sodium. WHAT FOODS CAN I EAT? Grains Breads, including [place], white, pita, wheat, raisin, rye, oatmeal, and [place]. Tortillas that are neither fried nor made with lard or trans fat. Low-fat rolls, including hotdog and hamburger buns and English muffins. Biscuits. Muffins. Waffles. Pancakes. Light popcorn. Whole-grain cereals. Flatbread. Melba toast. Pretzels. Breadsticks. Rusks. Low-fat snacks and crackers, including oyster, saltine, matzo, graham, animal, and rye. Rice and pasta, including brown rice and those that are made with whole wheat. Vegetables All  vegetables. Fruits All fruits, but limit coconut. Meats and Other Protein Sources Lean, well-trimmed beef, veal, pork, and lamb. Chicken and [place] without skin. All fish and shellfish. Wild duck, rabbit, pheasant, and venison. Egg whites or low-cholesterol egg substitutes. Dried beans, peas, lentils, and tofu.Seeds and most nuts. Dairy Low-fat or nonfat cheeses, including ricotta, string, and mozzarella. Skim or 1% milk that is liquid, powdered, or evaporated. Buttermilk that is made with low-fat milk. Nonfat or low-fat yogurt. Beverages Mineral water. Diet carbonated beverages. Sweets and Desserts Sherbets and fruit ices. Honey, jam, marmalade, jelly, and syrups. Meringues and gelatins. Pure sugar candy, such as hard candy, jelly beans, gumdrops, mints, marshmallows, and small amounts of dark chocolate. [employer]. Eat all sweets and desserts in moderation. Fats and Oils Nonhydrogenated (trans-free) margarines. Vegetable oils, including soybean, sesame, sunflower, olive, peanut, safflower, corn, canola, and cottonseed. Salad dressings or mayonnaise that are made with a vegetable oil. Limit added fats and oils that you use for cooking, baking, salads, and as spreads. Other Cocoa powder. Coffee and tea. All seasonings and condiments. The items listed above may not be a complete list of recommended foods or beverages. Contact your  dietitian for more options. WHAT FOODS ARE NOT RECOMMENDED? Grains Breads that are made with saturated or trans fats, oils, or whole milk. Croissants. Butter rolls. Cheese breads. Sweet rolls. Donuts. Buttered popcorn. Chow mein noodles. High-fat crackers, such as cheese or butter crackers. Meats and Other Protein Sources Fatty meats, such as hotdogs, short ribs, sausage, spareribs, bacon, ribeye roast or steak, and mutton. High-fat deli meats, such as salami and bologna. Caviar. Domestic duck and goose. Organ meats, such as kidney, liver, sweetbreads, brains,  gizzard, chitterlings, and heart. Dairy Cream, sour cream, cream cheese, and creamed cottage cheese. Whole milk cheeses, including blue (bleu), [address], [place], [place], [address], [place], [address], [place], [place], and [place]. Whole or 2% milk that is liquid, evaporated, or condensed. Whole buttermilk. Cream sauce or high-fat cheese sauce. Yogurt that is made from whole milk. Beverages Regular sodas and drinks with added sugar. Sweets and Desserts Frosting. Pudding. Cookies. Cakes other than angel food cake. Candy that has milk chocolate or white chocolate, hydrogenated fat, butter, coconut, or unknown ingredients. Buttered syrups. Full-fat ice cream or ice cream drinks. Fats and Oils Gravy that has suet, meat fat, or shortening. Cocoa butter, hydrogenated oils, palm oil, coconut oil, palm kernel oil. These can often be found in baked products, candy, fried foods, nondairy creamers, and whipped toppings. Solid fats and shortenings, including bacon fat, salt pork, lard, and butter. Nondairy cream substitutes, such as coffee creamers and sour cream substitutes. Salad dressings that are made of unknown oils, cheese, or sour cream. The items listed above may not be a complete list of foods and beverages to avoid. Contact your dietitian for more information.   This information is not intended to replace advice given to you by your health care provider. Make sure you discuss any questions you have with your health care provider.   Document Released: 10/31/2007 Document Revised: 02/11/2014 Document Reviewed: 07/15/2013 Elsevier Interactive Patient Education [employer]

## 2015-08-03 NOTE — Progress Notes (Signed)
The client had 9 beats of VT with the strip saved in epic for viewing, he is asymptomatic. I notified Azeem with cardiology on call. I will continue to monitor the client closely.   Azeem ordered a magnesium lab drawn to be added with morning labs.

## 2015-08-03 NOTE — Progress Notes (Signed)
Pt discharged home. Discharge instructions have been gone over with the patient. IV's removed. Pt given unit number and told to call if they have any concerns regarding their discharge instructions. Marcellino Fidalgo V, RN   

## 2015-08-03 NOTE — Discharge Summary (Signed)
Discharge Summary    Patient ID: Matthew Sexton,  MRN: [id number], DOB/AGE: [birth year] 45 y.o.  Admit date: 07/30/2015 Discharge date: 08/03/2015  Primary Care Provider: Fara Chute W Primary Cardiologist: Dr. Tresa Endo   Discharge Diagnoses    Principal Problem:   Unstable angina [facility]) Active Problems:   Non ischemic cardiomyopathy   Morbid obesity (HCC)   Mixed dyslipidemia   DM type 2 with diabetic dyslipidemia (HCC)   NSVT   Possible sleep apnea   Bradycardia  Allergies No Known Allergies  Diagnostic Studies/Procedures    Left Heart Cath and Coronary Angiography   07/31/15    Conclusion    1. 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 2. There is moderate left ventricular systolic dysfunction. 3. Severely elevated LVEDP.  Angiographic normal coronary arteries. Suspect that his unstable angina symptoms may very well be related to elevated LVEDP with cardiomyopathy.  Plan:  Return to nursing unit for ongoing care.  TR band removal per current protocol.  No further heparin as was no active coronary disease.  Will give 1 dose of IV Lasix this evening for elevated LVEDP. Within check 2-D echocardiogram in the morning to reassess function and filling pressures.  We'll need to titrate cardiac medications prior to discharge.   Echo 08/01/15 LV EF: 30% - 35%  ------------------------------------------------------------------- History: PMH: Cardiomyopathy. Coronary artery disease.  ------------------------------------------------------------------- Study Conclusions  - Procedure narrative: Transthoracic echocardiography. Image  quality was adequate. The study was technically difficult, as a  result of body habitus. Definity contrast administered. - Left ventricle: The cavity size was normal. Wall thickness was  normal. Systolic function was moderately to severely reduced. The  estimated ejection fraction was in the range of 30% to 35%. No  mural thrombus seen  with Definity contrast. Diffuse hypokinesis.  Doppler parameters are consistent with pseudonormal left  ventricular relaxation (grade 2 diastolic dysfunction). The E/A  ratio is >1.5. The E/e&' ratio is >20, suggesting markedly  elevated LV filling pressure. - Left atrium: The atrium was normal in size. - Tricuspid valve: There was no significant regurgitation. - Inferior vena cava: The vessel was normal in size. The  respirophasic diameter changes were in the normal range (>= 50%),  consistent with normal central venous pressure.  Impressions:  - Technically difficult study. Definity contrast given. LVEF  30-35%, global hypokinesis, grade 2 DD with markedly elevated LV  filling pressures, normal LA size, normal IVC.   History of Present Illness     Matthew Sexton is a 45 y.o.male with no prior history of coronary artery disease, but family history of early onset CAD in his father,brother and sister, presented to the [facility] ER 07/30/15 with complaints of chest pain.   He has history of diabetes but is not taking medication. He has been feeling much more tired, less energy, fatigue and chest discomfort with minimal exertion. He is a Optician, dispensing, and was carrying some wood to a pile and began to have chest pain, squeezing, "fist to his left chest" with associated diaphoresis, dyspnea, pallor, and some radiation through to his back and left shoulder. Lady at his church brought him to ER.   EKG NSR with evidence of old septa//anterior infarct. TG elevated at 415, TC 157, HDL 30, with inability to calculate LDL. He was treated with ASA, morphine, and started on BB, statin and heparin SQ. Seen by Dr. Wyline Mood. Given CAD risk factors, strong story, and runs of NSVT high pretest probability of CAD, noninvasive imaging would not adequately rule out ischemia,  and transfered to [facility] for cath.  Hospital Course     Consultants: one  1. Unstable angina (HCC) - Troponin flat low trend. Cath showed  normal coronaries with 35% mid RCA, elevated LVEDP. EF of 35% on cath. Given IV lasix x 1. Chest pressure with ambulation initially and then improved. Offer cardiac rehab however he is not interested currently.  No further chest pain.   2. Non ischemic cardiomyopathy - Echo showed EF of 30-35%, global hypokinesis, grade 2 DD with markedly elevated LV filling pressures, normal LA size, normal IVC. Cath with normal coronaries. BNP normal.  - Continue coreg, lisinopril, statin, spironolactone and aspirin.   3. DM - On SSI. At discharge started metformin  qd. F/u with PCP/   4. HL - 07/31/2015: Cholesterol 157; HDL 30*; LDL Cholesterol UNABLE TO CALCULATE IF TRIGLYCERIDE OVER 400 mg/dL; Triglycerides 415*; VLDL UNABLE TO CALCULATE IF TRIGLYCERIDE OVER 400 mg/dL  - Continue lipitor, Lovaza and fibrates. Check LFT and lipid panel in 6 weeks.   5. Morbid obesity (HCC) - Encouraged life style changes. Weight loss.   6. Possible sleep apnea - Outpatient sleep study  7. NSVT x1  - Mg and K WML. Asymptomatic.   8. Bradycardia - Heart rate persisted in mid 40s to low 50s despite  holding coreg. Decrease coreg to 1/2 of 47 mg bid.   The patient has been seen by Dr. Tresa Endo  today and deemed ready for discharge home. All follow-up appointments have been scheduled. Discharge medications are listed below.   Pt would prefer to see Dr. Tresa Endo in f/u in light of probable sleep apnea in addition to his cardiomyopathy.   Discharge Vitals Blood pressure 103/49, pulse 55, temperature 98.4 F (36.9 C), temperature source Oral, resp. rate 17, height  (1.88 m), weight 328 lb 14.4 oz (149.188 kg), SpO2 97 %.  Filed Weights   08/01/15 0500 08/02/15 0600 08/03/15 0510  Weight: 331 lb 3.2 oz (150.231 kg) 329 lb 8 oz (149.46 kg) 328 lb 14.4 oz (149.188 kg)    Labs & Radiologic Studies     CBC  Recent Labs  08/02/15 0413 08/03/15 0338  WBC 6.9 7.6  HGB 12.5* 14.0  HCT 39.0 43.1  MCV  88.4 90.5  PLT 203 217   Basic Metabolic Panel  Recent Labs  07/31/15 1713 08/03/15 0113 08/03/15 0338  NA  --   --  138  K  --   --  4.5  CL  --   --  105  CO2  --   --  25  GLUCOSE  --   --  155*  BUN  --   --  17  CREATININE 0.92  --  1.00  CALCIUM  --   --  9.4  MG  --  1.9  --    Liver Function Tests No results for input(s): AST, ALT, ALKPHOS, BILITOT, PROT, ALBUMIN in the last 72 hours. No results for input(s): LIPASE, AMYLASE in the last 72 hours. Cardiac Enzymes No results for input(s): CKTOTAL, CKMB, CKMBINDEX, TROPONINI in the last 72 hours. BNP Invalid input(s): POCBNP D-Dimer No results for input(s): DDIMER in the last 72 hours. Hemoglobin A1C No results for input(s): HGBA1C in the last 72 hours. Fasting Lipid Panel No results for input(s): CHOL, HDL, LDLCALC, TRIG, CHOLHDL, LDLDIRECT in the last 72 hours. Thyroid Function Tests No results for input(s): TSH, T4TOTAL, T3FREE, THYROIDAB in the last 72 hours.  Invalid input(s): FREET3  Dg Chest  2 View  07/30/2015  CLINICAL DATA:  Left-sided chest pain this morning. Shortness of breath. Former smoker. EXAM: CHEST  2 VIEW COMPARISON:  None. FINDINGS: The cardiomediastinal silhouette is within normal limits. The lungs are mildly hypoinflated. There is mild central airway thickening. No segmental airspace consolidation, overt pulmonary edema, pleural effusion, or pneumothorax is identified. No acute osseous abnormality is seen. IMPRESSION: Mild bronchitic changes. Electronically Signed   By: Sebastian Ache M.D.   On: 07/30/2015 10:23    Disposition   Pt is being discharged home today in good condition.  Follow-up Plans & Appointments    Follow-up Information    Follow up with Azalee Course, PA. Go on 08/16/2015.   Specialties:  Cardiology, Radiology   Why:  @9am  for post hospital    Contact information:   [address] [place] [address] [phone]       Follow up with [name], MD. Schedule  an appointment as soon as possible for a visit in 2 weeks.   Specialty:  Family Medicine   Why:  for DM   Contact information:   [address] [place] [address] [phone]      Discharge Instructions    Diet - low sodium heart healthy    Complete by:  As directed      Increase activity slowly    Complete by:  As directed            Discharge Medications   Current Discharge Medication List    START taking these medications   Details  aspirin EC 81 MG EC tablet Take 1 tablet (81 mg total) by mouth daily.    atorvastatin (LIPITOR) 40 MG tablet Take 1 tablet (40 mg total) by mouth daily at 6 PM. Qty: 30 tablet, Refills: 6    carvedilol (COREG) 3.125 MG tablet Take 0.5 tablets (1.56 mg total) by mouth 2 (two) times daily with a meal. Qty: 30 tablet, Refills: 6    fenofibrate 160 MG tablet Take 1 tablet (160 mg total) by mouth daily. Qty: 30 tablet, Refills: 6    lisinopril (PRINIVIL,ZESTRIL) 10 MG tablet Take 1 tablet (10 mg total) by mouth daily. Qty: 30 tablet, Refills: 6    metFORMIN (GLUCOPHAGE) 1000 MG tablet Take 1 tablet (1,000 mg total) by mouth daily after supper. Qty: 30 tablet, Refills: 6    nitroGLYCERIN (NITROSTAT) 0.4 MG SL tablet Place 1 tablet (0.4 mg total) under the tongue every 5 (five) minutes as needed for chest pain. Qty: 25 tablet, Refills: 12    omega-3 acid ethyl esters (LOVAZA) 1 g capsule Take 2 capsules (2 g total) by mouth 2 (two) times daily. Qty: 120 capsule, Refills: 6           Outstanding Labs/Studies   Sleep study (Dr. Tresa Endo has sent message to his nurse), Check LFT and lipid panel in 6 weeks.   Duration of Discharge Encounter   Greater than 30 minutes including physician time.  Signed, Berman Grainger PA-C 08/03/2015, 3:39 PM

## 2015-08-03 NOTE — Progress Notes (Signed)
Patient Name: Matthew Sexton Date of Encounter: 08/03/2015   SUBJECTIVE  Episode of NSVT overnight, asymptomatic, sleeping.  CP with ambulation yesterday, improving. No SOB.   CURRENT MEDS . aspirin EC  81 mg Oral Daily  . atorvastatin  40 mg Oral q1800  . carvedilol  3.125 mg Oral BID WC  . fenofibrate  160 mg Oral Daily  . insulin aspart  0-15 Units Subcutaneous TID WC  . lisinopril  5 mg Oral Daily  . omega-3 acid ethyl esters  2 g Oral BID  . senna  1 tablet Oral BID  . sodium chloride flush  3 mL Intravenous Q12H  . sodium chloride flush  3 mL Intravenous Q12H  . spironolactone  12.5 mg Oral Daily    OBJECTIVE  Filed Vitals:   08/02/15 0600 08/02/15 1548 08/02/15 1910 08/03/15 0510  BP: 125/83 113/68 114/63 119/67  Pulse:  56 54 59  Temp: 98 F (36.7 C) 98.3 F (36.8 C) 98.8 F (37.1 C) 97.8 F (36.6 C)  TempSrc: Oral Oral Oral Oral  Resp:  18 18 18   Height:      Weight: 329 lb 8 oz (149.46 kg)   328 lb 14.4 oz (149.188 kg)  SpO2: 98% 100% 95% 99%    Intake/Output Summary (Last 24 hours) at 08/03/15 0644 Last data filed at 08/02/15 1837  Gross per 24 hour  Intake   1220 ml  Output   1200 ml  Net     20 ml   Filed Weights   08/01/15 0500 08/02/15 0600 08/03/15 0510  Weight: 331 lb 3.2 oz (150.231 kg) 329 lb 8 oz (149.46 kg) 328 lb 14.4 oz (149.188 kg)    PHYSICAL EXAM  General: Pleasant, obese male in  NAD. Neuro: Alert and oriented X 3. Moves all extremities spontaneously. Psych: Normal affect. HEENT:  Normal  Neck: Supple without bruits or JVD. Lungs:  Resp regular and unlabored, CTA. Heart: RRR no s3, s4, or murmurs. Abdomen: Soft, non-tender, non-distended, BS + x 4.  Extremities: No clubbing, cyanosis or edema. DP/PT/Radials 2+ and equal bilaterally. R radial cath site without hematoma or bruit.   Accessory Clinical Findings  CBC  Recent Labs  08/02/15 0413 08/03/15 0338  WBC 6.9 7.6  HGB 12.5* 14.0  HCT 39.0 43.1  MCV 88.4 90.5  PLT  203 217   Basic Metabolic Panel  Recent Labs  07/31/15 1713 08/03/15 0113 08/03/15 0338  NA  --   --  138  K  --   --  4.5  CL  --   --  105  CO2  --   --  25  GLUCOSE  --   --  155*  BUN  --   --  17  CREATININE 0.92  --  1.00  CALCIUM  --   --  9.4  MG  --  1.9  --    Liver Function Tests No results for input(s): AST, ALT, ALKPHOS, BILITOT, PROT, ALBUMIN in the last 72 hours. No results for input(s): LIPASE, AMYLASE in the last 72 hours. Cardiac Enzymes No results for input(s): CKTOTAL, CKMB, CKMBINDEX, TROPONINI in the last 72 hours. BNP Invalid input(s): POCBNP D-Dimer No results for input(s): DDIMER in the last 72 hours. Hemoglobin A1C No results for input(s): HGBA1C in the last 72 hours. Fasting Lipid Panel No results for input(s): CHOL, HDL, LDLCALC, TRIG, CHOLHDL, LDLDIRECT in the last 72 hours. Thyroid Function Tests No results for input(s): TSH, T4TOTAL, T3FREE, THYROIDAB  in the last 72 hours.  Invalid input(s): FREET3 BNP (last 3 results)  Recent Labs  08/02/15 1405  BNP 78.5    ProBNP (last 3 results) No results for input(s): PROBNP in the last 8760 hours.   TELE  Sinus rhythm at rate of high 40s to low 50s. NSVT 9 beats x 1.   Echo 08/01/15 LV EF: 30% - 35%  ------------------------------------------------------------------- History: PMH: Cardiomyopathy. Coronary artery disease.  ------------------------------------------------------------------- Study Conclusions  - Procedure narrative: Transthoracic echocardiography. Image  quality was adequate. The study was technically difficult, as a  result of body habitus. Definity contrast administered. - Left ventricle: The cavity size was normal. Wall thickness was  normal. Systolic function was moderately to severely reduced. The  estimated ejection fraction was in the range of 30% to 35%. No  mural thrombus seen with Definity contrast. Diffuse hypokinesis.  Doppler parameters  are consistent with pseudonormal left  ventricular relaxation (grade 2 diastolic dysfunction). The E/A  ratio is >1.5. The E/e&' ratio is >20, suggesting markedly  elevated LV filling pressure. - Left atrium: The atrium was normal in size. - Tricuspid valve: There was no significant regurgitation. - Inferior vena cava: The vessel was normal in size. The  respirophasic diameter changes were in the normal range (>= 50%),  consistent with normal central venous pressure.  Impressions:  - Technically difficult study. Definity contrast given. LVEF  30-35%, global hypokinesis, grade 2 DD with markedly elevated LV  filling pressures, normal LA size, normal IVC.  Radiology/Studies  Dg Chest 2 View  07/30/2015  CLINICAL DATA:  Left-sided chest pain this morning. Shortness of breath. Former smoker. EXAM: CHEST  2 VIEW COMPARISON:  None. FINDINGS: The cardiomediastinal silhouette is within normal limits. The lungs are mildly hypoinflated. There is mild central airway thickening. No segmental airspace consolidation, overt pulmonary edema, pleural effusion, or pneumothorax is identified. No acute osseous abnormality is seen. IMPRESSION: Mild bronchitic changes. Electronically Signed   By: Sebastian Ache M.D.   On: 07/30/2015 10:23    ASSESSMENT AND PLAN  1. Unstable angina (HCC) - Troponin flat low trend. Cath showed normal coronaries with 35% mid RCA, elevated LVEDP. EF of 35% on cath. Given IV lasix x 1. Chest pressure with ambulation yesterday. Improving. Held coreg yesterday to HR persisted in 40-50s. Increased lisinoprol 10 5 mg yesterday. Offer cardiac rehab however he is not interested currently.    2. Non ischemic cardiomyopathy - Echo showed EF of 30-35%, global hypokinesis, grade 2 DD with markedly elevated LV filling pressures, normal LA size, normal IVC. Cath with normal coronaries. BNP normal.  - Continue coreg, lisinopril, statin , spironolactone and aspirin.   3. DM - On SSI.  At discharge will start metformin 1000mg  qd.   4. HL - 07/31/2015: Cholesterol 157; HDL 30*; LDL Cholesterol UNABLE TO CALCULATE IF TRIGLYCERIDE OVER 400 mg/dL; Triglycerides 415*; VLDL UNABLE TO CALCULATE IF TRIGLYCERIDE OVER 400 mg/dL  - Continue lipitor 40mg  Lovaza and fibrates. Check LFT and lipid panel in 6 weeks.   5. Morbid obesity (HCC) - Encouraged life style changes. Weight loss.   6. Possible sleep apnea - Outpatient sleep study  7. NSVT - Mg and K WML. Asymptomatic.   8. Bradycardia - Held coreg yesterday However heart rate persisted in mid 40s to low 50s. Transient rate in 39-42 overnight. Will review with MD.   Dispo: Ambulated. Likely discharge later today. Dr. Tresa Endo to decide BB.     Lorelei Pont PA-C Pager [phone]  Patient  seen and examined. Agree with assessment and plan. Excellent diuresis. -3056 since admission. BNP 78.  Will dc spironolactone. Continue lisinopril at 10 mg and decrease coreg to initially 1/2 of 3.125 mg bid.  I have told my nurse to schedule pt for an outpatient sleep study. Pt would prefer to see me in f/u in light of probable sleep apnea in addition to his cardiomyopathy.  Will dc today.   [name], MD, [facility] 08/03/2015 1:46 PM

## 2015-08-07 ENCOUNTER — Encounter (HOSPITAL_COMMUNITY): Payer: Self-pay | Admitting: Emergency Medicine

## 2015-08-07 ENCOUNTER — Emergency Department (HOSPITAL_COMMUNITY): Payer: Self-pay

## 2015-08-07 ENCOUNTER — Emergency Department (HOSPITAL_COMMUNITY)
Admission: EM | Admit: 2015-08-07 | Discharge: 2015-08-07 | Disposition: A | Discharge status: Home / Self Care | Payer: Self-pay | Attending: Emergency Medicine | Admitting: Emergency Medicine

## 2015-08-07 DIAGNOSIS — Z7984 Long term (current) use of oral hypoglycemic drugs: Secondary | ICD-10-CM | POA: Insufficient documentation

## 2015-08-07 DIAGNOSIS — I509 Heart failure, unspecified: Secondary | ICD-10-CM | POA: Insufficient documentation

## 2015-08-07 DIAGNOSIS — Z7982 Long term (current) use of aspirin: Secondary | ICD-10-CM | POA: Insufficient documentation

## 2015-08-07 DIAGNOSIS — Z79899 Other long term (current) drug therapy: Secondary | ICD-10-CM | POA: Insufficient documentation

## 2015-08-07 DIAGNOSIS — Z87891 Personal history of nicotine dependence: Secondary | ICD-10-CM | POA: Insufficient documentation

## 2015-08-07 DIAGNOSIS — I429 Cardiomyopathy, unspecified: Secondary | ICD-10-CM | POA: Insufficient documentation

## 2015-08-07 DIAGNOSIS — E119 Type 2 diabetes mellitus without complications: Secondary | ICD-10-CM | POA: Insufficient documentation

## 2015-08-07 HISTORY — DX: Obesity, unspecified: E66.9

## 2015-08-07 LAB — I-STAT TROPONIN, ED
Troponin i, poc: 0.01 ng/mL (ref 0.00–0.08)
Troponin i, poc: 0.01 ng/mL (ref 0.00–0.08)

## 2015-08-07 LAB — CBC
HEMATOCRIT: 44.4 % (ref 39.0–52.0)
HEMOGLOBIN: 14.3 g/dL (ref 13.0–17.0)
MCH: 29.1 pg (ref 26.0–34.0)
MCHC: 32.2 g/dL (ref 30.0–36.0)
MCV: 90.4 fL (ref 78.0–100.0)
Platelets: 256 10*3/uL (ref 150–400)
RBC: 4.91 MIL/uL (ref 4.22–5.81)
RDW: 12.5 % (ref 11.5–15.5)
WBC: 9.7 10*3/uL (ref 4.0–10.5)

## 2015-08-07 LAB — BASIC METABOLIC PANEL
ANION GAP: 9 (ref 5–15)
BUN: 31 mg/dL — ABNORMAL HIGH (ref 6–20)
CO2: 25 mmol/L (ref 22–32)
Calcium: 9.8 mg/dL (ref 8.9–10.3)
Chloride: 100 mmol/L — ABNORMAL LOW (ref 101–111)
Creatinine, Ser: 1.26 mg/dL — ABNORMAL HIGH (ref 0.61–1.24)
GFR calc Af Amer: >60 mL/min (ref >60)
GLUCOSE: 160 mg/dL — AB (ref 65–99)
POTASSIUM: 4.6 mmol/L (ref 3.5–5.1)
Sodium: 134 mmol/L — ABNORMAL LOW (ref 135–145)

## 2015-08-07 LAB — BRAIN NATRIURETIC PEPTIDE: B NATRIURETIC PEPTIDE 5: 15.9 pg/mL (ref 0.0–100.0)

## 2015-08-07 MED ORDER — ONDANSETRON HCL 4 MG/2ML IJ SOLN
4.0000 mg | Freq: Once | INTRAMUSCULAR | Status: AC
  Administered 2015-08-07: 4 mg via INTRAVENOUS
  Filled 2015-08-07: qty 2

## 2015-08-07 MED ORDER — FUROSEMIDE 10 MG/ML IJ SOLN
40.0000 mg | Freq: Once | INTRAMUSCULAR | Status: AC
  Administered 2015-08-07: 40 mg via INTRAVENOUS
  Filled 2015-08-07: qty 4

## 2015-08-07 MED ORDER — MORPHINE SULFATE (PF) 4 MG/ML IV SOLN
4.0000 mg | INTRAVENOUS | Status: DC | PRN
  Administered 2015-08-07: 4 mg via INTRAVENOUS
  Filled 2015-08-07: qty 1

## 2015-08-07 MED ORDER — HYDROCHLOROTHIAZIDE 25 MG PO TABS: 25.0000 mg | ORAL_TABLET | Freq: Every day | ORAL | Status: DC

## 2015-08-07 NOTE — ED Notes (Signed)
Patient transported to X-ray 

## 2015-08-07 NOTE — Discharge Instructions (Signed)
Decrease your lisinopril to 5 mg daily. Start hydrochlorothiazide once daily. May take NTG as needed for pain/shortness of breath.   Cardiomyopathy Cardiomyopathy is a long-term (chronic) disease of the heart muscle (myocardium). Over time, the heart becomes abnormally large, thick, or stiff. This makes it harder for the heart to pump blood and can lead to heart failure. There are several types of cardiomyopathy:  Dilated cardiomyopathy. This type causes the ventricles become large and weak.  Hypertrophic cardiomyopathy. This type causes the heart muscle to thicken.  Restrictive cardiomyopathy. This type causes the heart muscle to become rigid and less elastic.  Ischemic cardiomyopathy. This type involves narrowing arteries that cause the walls of the heart get thinner.  Peripartum cardiomyopathy. This type occurs during pregnancy or shortly after pregnancy. CAUSES The cause of cardiomyopathy is often not known. In some cases, it is passed down (inherited) from a family member who also had cardiomyopathy. The disease may develop as a complication of another medical condition. These conditions can include:  Diabetes.  High blood pressure.  Viral infection of the heart.  Heart attack.  Coronary heart disease. RISK FACTORS You may be more likely to develop cardiomyopathy if you:  Have a family history of cardiomyopathy or other heart problems.  Are overweight or obese.  Use illegal drugs.  Abuse alcohol.  Have diabetes.  Have another disease that can cause cardiomyopathy as a complication. SIGNS AND SYMPTOMS Often, cardiomyopathy has no signs or symptoms. If you do have symptoms, they may include:  Shortness of breath, especially during activity.  Fatigue.  An irregular heartbeat (arrhythmia).  Dizziness, light-headedness, or fainting.  Chest pain.  Swelling in the lower leg or ankle. DIAGNOSIS Your health care provider may suspect cardiomyopathy based on your  symptoms and medical history. Your health care provider will also do a physical exam. Other tests done may include:  Blood tests.  Imaging studies of your heart. These may be done using:  X-rays to check if your heart is enlarged.  Echocardiogram to show the size of your heart and how well it pumps.  MRI.  A test to record the electrical activity of your heart (electrocardiogram or ECG).  A test in which you wear a portable device (event monitor) to record your heart's electrical activity while you go about your day.  A test to monitor your heart's activity while you exercise (stress test).   A procedure to check the blood pressure and blood flow in your heart(cardiac catheterization).  Injection of dye into your arteries before imaging studies are taken (angiogram).  Removal of a sample of heart tissue (biopsy). The sample is examined for problems. TREATMENT Treatment depends on the type of cardiomyopathy you have and the severity of your symptoms. If you are not having any symptoms, you might not need treatment. If you need treatment, it may include:  Lifestyle changes.  Quit smoking, if you smoke.  Maintain a healthy weight. Lose weight if directed by your health care provider.  Eat a healthy diet. Include plenty of fruits, vegetables, and whole grains.  Get regular exercise. Ask your health care provider to suggest some activities that are good for you.  Medicine. You may need to take medicine to:  Lower your blood pressure.  Slow down your heart rate.  Keep your heart beating in a steady rhythm.  Clear excess fluids from your body.  Prevent blood clots.  Surgery. You may need surgery to:  Repair a defect.  Remove thickened tissue.  Implant  a device to treat serious heart rhythm problems (implantable cardioverter-defibrillator or ICD).  Replace your heart (heart transplant) if all other treatments have failed (end stage). HOME CARE INSTRUCTIONS  Take  medicines only as directed by your health care provider.  Eat a heart-healthy diet. Work with your health care provider or a registered dietitian to learn about healthy eating options.  Maintain a healthy weight and stay physically active.  Do not use any tobacco products, including cigarettes, chewing tobacco, or electronic cigarettes. If you need help quitting, ask your health care provider.  Work closely with your health care provider to manage chronic conditions, such as diabetes and high blood pressure.  Limit alcohol intake to no more than one drink per day for nonpregnant women and no more than two drinks per day for men. One drink equals 12 ounces of beer, 5 ounces of wine, or 1 ounces of hard liquor.  Try to get at least 7 hours of sleep each night.  Find ways to manage stress.  Keep all follow-up visits as directed by your health care provider. This is important. SEEK MEDICAL CARE IF:  Your symptoms get worse, even after treatment.  You have new symptoms. SEEK IMMEDIATE MEDICAL CARE IF:  You have severe chest pain.  You have shortness of breath.  You cough up pink, bubbly material.  You have sudden sweating.  You feel nauseous and vomit.  You suddenly become light-headed or dizzy.  You feel your heart beating very fast.  It feels like your heart is skipping beats. These symptoms may represent a serious problem that is an emergency. Do not wait to see if the symptoms will go away. Get medical help right away. Call your local emergency services (911 in the [place] Do not drive yourself to the hospital.   This information is not intended to replace advice given to you by your health care provider. Make sure you discuss any questions you have with your health care provider.   Document Released: 04/05/2004 Document Revised: 02/11/2014 Document Reviewed: 07/01/2013 Elsevier Interactive Patient Education [employer]

## 2015-08-07 NOTE — ED Notes (Signed)
MD at bedside. 

## 2015-08-07 NOTE — ED Provider Notes (Signed)
CSN: [id number]     Arrival date & time 08/07/15  0013 History  By signing my name below, I, Bethel Born, attest that this documentation has been prepared under the direction and in the presence of Rolland Porter, MD. Electronically Signed: Bethel Born, ED Scribe. 08/07/2015. 2:23 AM   Chief Complaint  Patient presents with  . Chest Pain   The history is provided by the patient. No language interpreter was used.   Matthew Sexton is a 45 y.o. male with PMHx of CHF, DM, and obesity who presents to the Emergency Department complaining of constant, 4/10 in severity, pressure-like, left-sided chest pain with onset approximately 2.5 hours ago. Pt states that he was sitting in a chair and started to feel sweaty, short of breath, and dizzy 1 hour before a pain shot across his chest. The pain then localized on the left side and felt like a fist was present. He took 1 NTG and had some brief relief.  Earlier in the day he'd had some fatigue and SOB but bo pain. Pt was discharged from the hospital 4 days ago after being treated for CHF. He felt better when he left the hospital and did not go home on any Lasix. Until today his symptoms had improved.   Past Medical History  Diagnosis Date  . Diabetes (HCC)   . CHF (congestive heart failure) (HCC)   . Obesity    Past Surgical History  Procedure Laterality Date  . Appendectomy    . Cardiac catheterization N/A 07/31/2015    Procedure: Left Heart Cath and Coronary Angiography;  Surgeon: Marykay Lex, MD;  Location: [facility] INVASIVE CV LAB;  Service: Cardiovascular;  Laterality: N/A;   Family History  Problem Relation Age of Onset  . Heart attack Father 49    Died early 47's   . Coronary artery disease Brother 60    STENTS  . Angina Sister 16    Chronic   Social History  Substance Use Topics  . Smoking status: Former Games developer  . Smokeless tobacco: None  . Alcohol Use: No    Review of Systems  Constitutional: Positive for diaphoresis. Negative for  fever, chills, appetite change and fatigue.  HENT: Negative for mouth sores, sore throat and trouble swallowing.   Eyes: Negative for visual disturbance.  Respiratory: Positive for shortness of breath. Negative for cough, chest tightness and wheezing.   Cardiovascular: Positive for chest pain.  Gastrointestinal: Negative for nausea, vomiting, abdominal pain, diarrhea and abdominal distention.  Endocrine: Negative for polydipsia, polyphagia and polyuria.  Genitourinary: Negative for dysuria, frequency and hematuria.  Musculoskeletal: Negative for gait problem.  Skin: Negative for color change, pallor and rash.  Neurological: Positive for dizziness. Negative for syncope, light-headedness and headaches.  Hematological: Does not bruise/bleed easily.  Psychiatric/Behavioral: Negative for behavioral problems and confusion.    Allergies  Review of patient's allergies indicates no known allergies.  Home Medications   Prior to Admission medications   Medication Sig Start Date End Date Taking? Authorizing Provider  aspirin EC 81 MG EC tablet Take 1 tablet (81 mg total) by mouth daily. 08/03/15  Yes Bhavinkumar Bhagat, PA  atorvastatin (LIPITOR) 40 MG tablet Take 1 tablet (40 mg total) by mouth daily at 6 PM. 08/03/15  Yes Bhavinkumar Bhagat, PA  carvedilol (COREG) 3.125 MG tablet Take 0.5 tablets (1.56 mg total) by mouth 2 (two) times daily with a meal. 08/03/15  Yes Bhavinkumar Bhagat, PA  fenofibrate 160 MG tablet Take 1 tablet (160 mg total)  by mouth daily. 08/03/15  Yes Bhavinkumar Bhagat, PA  lisinopril (PRINIVIL,ZESTRIL) 10 MG tablet Take 1 tablet (10 mg total) by mouth daily. 08/03/15  Yes Bhavinkumar Bhagat, PA  metFORMIN (GLUCOPHAGE) 1000 MG tablet Take 1 tablet (1,000 mg total) by mouth daily after supper. 08/03/15  Yes Bhavinkumar Bhagat, PA  nitroGLYCERIN (NITROSTAT) 0.4 MG SL tablet Place 1 tablet (0.4 mg total) under the tongue every 5 (five) minutes as needed for chest pain. 08/03/15  Yes  Bhavinkumar Bhagat, PA  omega-3 acid ethyl esters (LOVAZA) 1 g capsule Take 2 capsules (2 g total) by mouth 2 (two) times daily. 08/03/15  Yes Bhavinkumar Bhagat, PA  hydrochlorothiazide (HYDRODIURIL) 25 MG tablet Take 1 tablet (25 mg total) by mouth daily. 08/07/15   Rolland Porter, MD   BP 100/66 mmHg  Pulse 62  Temp(Src) 98.6 F (37 C) (Oral)  Resp 20  Ht 6\' 2"  (1.88 m)  Wt 327 lb (148.326 kg)  BMI 41.97 kg/m2  SpO2 98% Physical Exam  Constitutional: He is oriented to person, place, and time. He appears well-developed and well-nourished. No distress.  HENT:  Head: Normocephalic.  Eyes: Conjunctivae are normal. Pupils are equal, round, and reactive to light. No scleral icterus.  Neck: Normal range of motion. Neck supple. No thyromegaly present.  Cardiovascular: Normal rate and regular rhythm.  Exam reveals no gallop and no friction rub.   No murmur heard. Pulmonary/Chest: Effort normal and breath sounds normal. No respiratory distress. He has no wheezes. He has no rales.  Abdominal: Soft. Bowel sounds are normal. He exhibits no distension. There is no tenderness. There is no rebound.  Musculoskeletal: Normal range of motion.  Neurological: He is alert and oriented to person, place, and time.  Skin: Skin is warm and dry. No rash noted.  Psychiatric: He has a normal mood and affect. His behavior is normal.    ED Course  Procedures (including critical care time) DIAGNOSTIC STUDIES: Oxygen Saturation is 98% on RA,  normal by my interpretation.    COORDINATION OF CARE: 12:58 AM Discussed treatment plan which includes lab work, CXR, EKG, and pain medication with pt at bedside and pt agreed to plan.  2:19 AM-Consult complete with Dr. Virgina Organ (Cardiology). Patient case explained and discussed.Call ended at 2:22 AM  Labs Review Labs Reviewed  BASIC METABOLIC PANEL - Abnormal; Notable for the following:    Sodium 134 (*)    Chloride 100 (*)    Glucose, Bld 160 (*)    BUN 31 (*)     Creatinine, Ser 1.26 (*)    All other components within normal limits  CBC  BRAIN NATRIURETIC PEPTIDE  I-STAT TROPOININ, ED  Rosezena Sensor, ED    Imaging Review Dg Chest 2 View  08/07/2015  CLINICAL DATA:  Acute onset of generalized chest pain. Initial encounter. EXAM: CHEST  2 VIEW COMPARISON:  Chest radiograph performed 07/30/2015 FINDINGS: The lungs are well-aerated and clear. There is no evidence of focal opacification, pleural effusion or pneumothorax. The heart is normal in size; the mediastinal contour is within normal limits. No acute osseous abnormalities are seen. IMPRESSION: No acute cardiopulmonary process seen. Electronically Signed   By: Roanna Raider M.D.   On: 08/07/2015 01:49   I have personally reviewed and evaluated these images and lab results as part of my medical decision-making.   EKG Interpretation   Date/Time:  Monday August 07 2015 00:17:02 EDT Ventricular Rate:  84 PR Interval:  166 QRS Duration: 80 QT Interval:  370  QTC Calculation: 437 R Axis:   84 Text Interpretation:  Sinus rhythm with sinus arrhythmia  new vs 07/30/2015  Otherwise normal ECG Confirmed by Fayrene Fearing  MD, Toris Laverdiere ([id number]) on 08/07/2015  12:23:00 AM      MDM   Final diagnoses:  Cardiomyopathy (HCC)    Patient given morphine and Lasix. He diuresis here. His pain resolved. His enzymes normal on arrival and it 3 hours and his EKG stays normal. I discussed the case with cardiology on-call. He had cath and only has 35% one vessel. No culprit lesions. Doubt ACS. Likely needs some daily diuresis to avoid overfilling. He has nonischemic cardiomyopathy with markedly elevated LVEDP on echo. Cardiology was in agreement. Recommended HCTZ to avoid any hypotension. Patient appropriate for discharge.   Rolland Porter, MD 08/07/15 [phone]

## 2015-08-07 NOTE — ED Notes (Signed)
Assisted with ambulating patient in hallway with out further dizziness. After ambulating patient, patient's blood pressure rose to 94/42.

## 2015-08-07 NOTE — ED Notes (Signed)
Pt. reports intermittent left chest pain with SOB and diaphoresis onset this evening , took 2 NTG sl with brief relief , his cardiologist is Dr. Tresa Endo , rates chest pressure  4/10.

## 2015-08-16 ENCOUNTER — Encounter (INDEPENDENT_AMBULATORY_CARE_PROVIDER_SITE_OTHER): Payer: Self-pay

## 2015-08-16 ENCOUNTER — Emergency Department (HOSPITAL_COMMUNITY): Payer: Self-pay

## 2015-08-16 ENCOUNTER — Inpatient Hospital Stay (HOSPITAL_COMMUNITY)
Admission: EM | Admit: 2015-08-16 | Discharge: 2015-08-21 | DRG: 315 | Disposition: A | Discharge status: Home / Self Care | Payer: Self-pay | Attending: Internal Medicine | Admitting: Internal Medicine

## 2015-08-16 ENCOUNTER — Ambulatory Visit (INDEPENDENT_AMBULATORY_CARE_PROVIDER_SITE_OTHER): Payer: Self-pay | Admitting: Physician Assistant

## 2015-08-16 ENCOUNTER — Encounter: Payer: Self-pay | Admitting: Physician Assistant

## 2015-08-16 ENCOUNTER — Encounter (HOSPITAL_COMMUNITY): Payer: Self-pay | Admitting: Nurse Practitioner

## 2015-08-16 VITALS — BP 120/70 | HR 62 | Ht 74.0 in | Wt 325.2 lb

## 2015-08-16 DIAGNOSIS — E782 Mixed hyperlipidemia: Secondary | ICD-10-CM | POA: Diagnosis present

## 2015-08-16 DIAGNOSIS — I200 Unstable angina: Secondary | ICD-10-CM

## 2015-08-16 DIAGNOSIS — Z6841 Body Mass Index (BMI) 40.0 and over, adult: Secondary | ICD-10-CM

## 2015-08-16 DIAGNOSIS — I472 Ventricular tachycardia: Secondary | ICD-10-CM

## 2015-08-16 DIAGNOSIS — I4729 Other ventricular tachycardia: Secondary | ICD-10-CM

## 2015-08-16 DIAGNOSIS — I5022 Chronic systolic (congestive) heart failure: Secondary | ICD-10-CM

## 2015-08-16 DIAGNOSIS — R200 Anesthesia of skin: Secondary | ICD-10-CM

## 2015-08-16 DIAGNOSIS — I428 Other cardiomyopathies: Secondary | ICD-10-CM | POA: Insufficient documentation

## 2015-08-16 DIAGNOSIS — Z7982 Long term (current) use of aspirin: Secondary | ICD-10-CM

## 2015-08-16 DIAGNOSIS — I5042 Chronic combined systolic (congestive) and diastolic (congestive) heart failure: Secondary | ICD-10-CM | POA: Diagnosis present

## 2015-08-16 DIAGNOSIS — R001 Bradycardia, unspecified: Secondary | ICD-10-CM | POA: Diagnosis present

## 2015-08-16 DIAGNOSIS — Z87891 Personal history of nicotine dependence: Secondary | ICD-10-CM

## 2015-08-16 DIAGNOSIS — I493 Ventricular premature depolarization: Secondary | ICD-10-CM | POA: Diagnosis present

## 2015-08-16 DIAGNOSIS — Z7984 Long term (current) use of oral hypoglycemic drugs: Secondary | ICD-10-CM

## 2015-08-16 DIAGNOSIS — E860 Dehydration: Secondary | ICD-10-CM | POA: Diagnosis present

## 2015-08-16 DIAGNOSIS — R079 Chest pain, unspecified: Secondary | ICD-10-CM

## 2015-08-16 DIAGNOSIS — N179 Acute kidney failure, unspecified: Secondary | ICD-10-CM | POA: Diagnosis present

## 2015-08-16 DIAGNOSIS — I25110 Atherosclerotic heart disease of native coronary artery with unstable angina pectoris: Secondary | ICD-10-CM | POA: Diagnosis present

## 2015-08-16 DIAGNOSIS — E1169 Type 2 diabetes mellitus with other specified complication: Secondary | ICD-10-CM

## 2015-08-16 DIAGNOSIS — I4510 Unspecified right bundle-branch block: Secondary | ICD-10-CM | POA: Diagnosis present

## 2015-08-16 DIAGNOSIS — I429 Cardiomyopathy, unspecified: Secondary | ICD-10-CM

## 2015-08-16 DIAGNOSIS — E785 Hyperlipidemia, unspecified: Secondary | ICD-10-CM

## 2015-08-16 DIAGNOSIS — E662 Morbid (severe) obesity with alveolar hypoventilation: Secondary | ICD-10-CM | POA: Diagnosis present

## 2015-08-16 HISTORY — DX: Chronic combined systolic (congestive) and diastolic (congestive) heart failure: I50.42

## 2015-08-16 HISTORY — DX: Other ventricular tachycardia: I47.29

## 2015-08-16 HISTORY — DX: Other symptoms and signs involving the nervous system: R29.818

## 2015-08-16 HISTORY — DX: Ventricular tachycardia: I47.2

## 2015-08-16 LAB — BASIC METABOLIC PANEL
ANION GAP: 8 (ref 5–15)
BUN: 37 mg/dL — ABNORMAL HIGH (ref 7–25)
BUN: 39 mg/dL — ABNORMAL HIGH (ref 6–20)
CALCIUM: 9.7 mg/dL (ref 8.6–10.3)
CHLORIDE: 100 mmol/L — AB (ref 101–111)
CO2: 25 mmol/L (ref 20–31)
CO2: 25 mmol/L (ref 22–32)
CREATININE: 1.24 mg/dL (ref 0.60–1.35)
CREATININE: 1.25 mg/dL — AB (ref 0.61–1.24)
Calcium: 9.5 mg/dL (ref 8.9–10.3)
Chloride: 102 mmol/L (ref 98–110)
GFR calc non Af Amer: >60 mL/min (ref >60)
Glucose, Bld: 255 mg/dL — ABNORMAL HIGH (ref 65–99)
Glucose, Bld: 179 mg/dL — ABNORMAL HIGH (ref 65–99)
POTASSIUM: 3.9 mmol/L (ref 3.5–5.1)
Potassium: 4.6 mmol/L (ref 3.5–5.3)
SODIUM: 135 mmol/L (ref 135–146)
SODIUM: 133 mmol/L — AB (ref 135–145)

## 2015-08-16 LAB — CBC
HEMATOCRIT: 39.6 % (ref 39.0–52.0)
HEMOGLOBIN: 13.5 g/dL (ref 13.0–17.0)
MCH: 30.2 pg (ref 26.0–34.0)
MCHC: 34.1 g/dL (ref 30.0–36.0)
MCV: 88.6 fL (ref 78.0–100.0)
PLATELETS: 263 10*3/uL (ref 150–400)
RBC: 4.47 MIL/uL (ref 4.22–5.81)
RDW: 11.9 % (ref 11.5–15.5)
WBC: 9.9 10*3/uL (ref 4.0–10.5)

## 2015-08-16 LAB — I-STAT TROPONIN, ED: Troponin i, poc: 0 ng/mL (ref 0.00–0.08)

## 2015-08-16 MED ORDER — ACETAMINOPHEN 325 MG PO TABS: 650.0000 mg | ORAL_TABLET | ORAL | Status: DC | PRN

## 2015-08-16 MED ORDER — FENOFIBRATE 160 MG PO TABS
160.0000 mg | ORAL_TABLET | Freq: Every day | ORAL | Status: DC
  Administered 2015-08-17 – 2015-08-21 (×5): 160 mg via ORAL
  Filled 2015-08-16 (×5): qty 1

## 2015-08-16 MED ORDER — CARVEDILOL 3.125 MG PO TABS
1.5600 mg | ORAL_TABLET | Freq: Once | ORAL | Status: AC
  Administered 2015-08-16: 1.56 mg via ORAL
  Filled 2015-08-16 (×2): qty 0.5

## 2015-08-16 MED ORDER — ATORVASTATIN CALCIUM 40 MG PO TABS
40.0000 mg | ORAL_TABLET | Freq: Every day | ORAL | Status: DC
  Administered 2015-08-17 – 2015-08-19 (×3): 40 mg via ORAL
  Filled 2015-08-16 (×4): qty 1

## 2015-08-16 MED ORDER — HYDRALAZINE HCL 20 MG/ML IJ SOLN: 5.0000 mg | INTRAMUSCULAR | Status: DC | PRN

## 2015-08-16 MED ORDER — HEPARIN SODIUM (PORCINE) 5000 UNIT/ML IJ SOLN
5000.0000 [IU] | Freq: Three times a day (TID) | INTRAMUSCULAR | Status: DC
Start: 2015-08-17 — End: 2015-08-21
  Administered 2015-08-17 – 2015-08-21 (×10): 5000 [IU] via SUBCUTANEOUS
  Filled 2015-08-16 (×13): qty 1

## 2015-08-16 MED ORDER — AMLODIPINE BESYLATE 5 MG PO TABS
5.0000 mg | ORAL_TABLET | Freq: Every day | ORAL | Status: DC
  Administered 2015-08-17 – 2015-08-21 (×5): 5 mg via ORAL
  Filled 2015-08-16 (×5): qty 1

## 2015-08-16 MED ORDER — MORPHINE SULFATE (PF) 2 MG/ML IV SOLN: 2.0000 mg | INTRAVENOUS | Status: DC | PRN

## 2015-08-16 MED ORDER — ASPIRIN EC 81 MG PO TBEC
81.0000 mg | DELAYED_RELEASE_TABLET | Freq: Every day | ORAL | Status: DC
  Administered 2015-08-17 – 2015-08-21 (×5): 81 mg via ORAL
  Filled 2015-08-16 (×6): qty 1

## 2015-08-16 MED ORDER — FUROSEMIDE 40 MG PO TABS: ORAL_TABLET | ORAL | Status: DC

## 2015-08-16 MED ORDER — ONDANSETRON HCL 4 MG/2ML IJ SOLN: 4.0000 mg | Freq: Four times a day (QID) | INTRAMUSCULAR | Status: DC | PRN

## 2015-08-16 MED ORDER — OMEGA-3-ACID ETHYL ESTERS 1 G PO CAPS
2.0000 g | ORAL_CAPSULE | Freq: Two times a day (BID) | ORAL | Status: DC
Start: 2015-08-17 — End: 2015-08-21
  Administered 2015-08-17 – 2015-08-21 (×9): 2 g via ORAL
  Filled 2015-08-16 (×9): qty 2

## 2015-08-16 MED ORDER — NITROGLYCERIN 0.4 MG SL SUBL: 0.4000 mg | SUBLINGUAL_TABLET | SUBLINGUAL | Status: DC | PRN

## 2015-08-16 NOTE — H&P (Addendum)
History and Physical    Matthew Sexton JXB:[id number] DOB: 12-Mar-1970 DOA: 08/16/2015  Referring MD/NP/PA:   PCP: [name], MD   Patient coming from:  The patient is coming from home.  At baseline, pt is independent for most of ADL.       Chief Complaint: Chest pain, left hand numbness and mild weakness  HPI: Matthew Sexton is a 45 y.o. 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  Patient reports that he started having chest pain and chest tightness at about 2:30 PM. The chest pain is located in the substernal area, intermittent, 2-4 out of 10 in severity, radiating to the left shoulder. He had shortness breath earlier, which has resolved. No cough, fever or chills. His chest pain happens frequently, more than 10 episodes so far, each episode lasted for few seconds to few minutes.  Patient also reports left hand and numbness which lasted for about 1 hour, but then started having numbness in left fingers which has been persistent. he has mild weakness in left hand. No vision change, hearing loss, sluured speech or facial droop. Patient does not have nausea, vomiting, abdominal pain, symptoms of a UTI. He had one episode of watery stool in early morning, no diarrhea later.   ED Course: pt was found to have negative troponin, WBC 9.9, temperature normal, bradycardia, sodium 133, AKI with Cre 1.25. Negative chest x-ray. Patient is placed on telemetry bed for observation.  Review of Systems:   General: no fevers, chills, no changes in body weight, has poor appetite, has fatigue HEENT: no blurry vision, hearing changes or sore throat Pulm: had dyspnea, no coughing, wheezing CV: has chest pain, no palpitations Abd: no nausea, vomiting, abdominal pain, diarrhea, constipation GU: no dysuria, burning on urination,  increased urinary frequency, hematuria  Ext: Trace leg edema Neuro: has left hand numbness and weakness. No vision change or hearing loss Skin: no rash MSK: No muscle spasm, no deformity, no limitation of range of movement in spin Heme: No easy bruising.  Travel history: No recent long distant travel.  Allergy: No Known Allergies  Past Medical History  Diagnosis Date  . Diabetes (HCC)   . CHF (congestive heart failure) (HCC)   . Obesity   . Non-ischemic cardiomyopathy (HCC)     Echo 08/01/15 - EF of 30-35%, global hypokinesis, grade 2 DD. Cath 07/31/15 - normal coronaries with 35% mid RCA  . Bradycardia   . HLD (hyperlipidemia)   . Snoring   . Chronic systolic CHF (congestive heart failure) (HCC)   . Unstable angina [facility])     Past Surgical History  Procedure Laterality Date  . Appendectomy    . Cardiac catheterization N/A 07/31/2015    Procedure: Left Heart Cath and Coronary Angiography;  Surgeon: Marykay Lex, MD;  Location: [facility] INVASIVE CV LAB;  Service: Cardiovascular;  Laterality: N/A;    Social History:  reports that he has quit smoking. He does not have any smokeless tobacco history on file. He reports that he does not drink alcohol or use illicit drugs.  Family History:  Family History  Problem Relation Age of Onset  . Heart attack Father 77    Died early 50's   . Coronary artery disease Brother 71    STENTS  . Angina Sister 42    Chronic     Prior to  Admission medications   Medication Sig Start Date End Date Taking? Authorizing Provider  aspirin EC 81 MG EC tablet Take 1 tablet (81 mg total) by mouth daily. 08/03/15  Yes Bhavinkumar Bhagat, PA  atorvastatin (LIPITOR) 40 MG tablet Take 1 tablet (40 mg total) by mouth daily at 6 PM. 08/03/15  Yes Bhavinkumar Bhagat, PA  carvedilol (COREG) 3.125 MG tablet Take 0.5 tablets (1.56 mg total) by mouth 2 (two) times daily with a meal. 08/03/15  Yes Bhavinkumar Bhagat, PA  fenofibrate 160 MG tablet Take 1 tablet (160 mg  total) by mouth daily. 08/03/15  Yes Bhavinkumar Bhagat, PA  hydrochlorothiazide (HYDRODIURIL) 25 MG tablet Take 1 tablet (25 mg total) by mouth daily. 08/07/15  Yes Rolland Porter, MD  lisinopril (PRINIVIL,ZESTRIL) 10 MG tablet Take 1 tablet (10 mg total) by mouth daily. 08/03/15  Yes Bhavinkumar Bhagat, PA  metFORMIN (GLUCOPHAGE) 1000 MG tablet Take 1 tablet (1,000 mg total) by mouth daily after supper. 08/03/15  Yes Bhavinkumar Bhagat, PA  omega-3 acid ethyl esters (LOVAZA) 1 g capsule Take 2 capsules (2 g total) by mouth 2 (two) times daily. 08/03/15  Yes Bhavinkumar Bhagat, PA  furosemide (LASIX) 40 MG tablet Take 1 tab by mouth as needed for shortness of breath or edema in legs, feet or ankles 08/16/15   Azalee Course, PA  nitroGLYCERIN (NITROSTAT) 0.4 MG SL tablet Place 0.4 mg under the tongue every 5 (five) minutes as needed for chest pain (3 DOSES MAX).    Historical Provider, MD    Physical Exam: Filed Vitals:   08/17/15 0015 08/17/15 0021 08/17/15 0030 08/17/15 0116  BP: 122/67  109/65 114/54  Pulse: 49  56 54  Temp:  97.8 F (36.6 C)  97.8 F (36.6 C)  TempSrc:    Oral  Resp: 20  18 21   Height:    6\' 2"  (1.88 m)  Weight:    140.524 kg (309 lb 12.8 oz)  SpO2: 97%  100% 98%   General: Not in acute distress HEENT:       Eyes: PERRL, EOMI, no scleral icterus.       ENT: No discharge from the ears and nose, no pharynx injection, no tonsillar enlargement.        Neck: No JVD, no bruit, no mass felt. Heme: No neck lymph node enlargement. Cardiac: S1/S2, RRR, No murmurs, No gallops or rubs. Pulm: No rales, wheezing, rhonchi or rubs. Abd: Soft, nondistended, nontender, no rebound pain, no organomegaly, BS present. GU: No hematuria Ext: has trace leg edema bilaterally. 2+DP/PT pulse bilaterally. Musculoskeletal: No joint deformities, No joint redness or warmth, no limitation of ROM in spin. Skin: No rashes.  Neuro: Alert, oriented X3, cranial nerves II-XII grossly intact, moves all extremities  normally. Muscle strength 5/5 in all extremities, sensation to light touch intact. Brachial reflex 2+ bilaterally. Negative Babinski's sign. Normal finger to nose test. Psych: Patient is not psychotic, no suicidal or hemocidal ideation.  Labs on Admission: I have personally reviewed following labs and imaging studies  CBC:  Recent Labs Lab 08/16/15 1806  WBC 9.9  HGB 13.5  HCT 39.6  MCV 88.6  PLT 263   Basic Metabolic Panel:  Recent Labs Lab 08/16/15 0949 08/16/15 1806  NA 135 133*  K 4.6 3.9  CL 102 100*  CO2 25 25  GLUCOSE 255* 179*  BUN 37* 39*  CREATININE 1.24 1.25*  CALCIUM 9.7 9.5   GFR: Estimated Creatinine Clearance: 109 mL/min (by C-G formula based on Cr  of 1.25). Liver Function Tests: No results for input(s): AST, ALT, ALKPHOS, BILITOT, PROT, ALBUMIN in the last 168 hours. No results for input(s): LIPASE, AMYLASE in the last 168 hours. No results for input(s): AMMONIA in the last 168 hours. Coagulation Profile: No results for input(s): INR, PROTIME in the last 168 hours. Cardiac Enzymes: No results for input(s): CKTOTAL, CKMB, CKMBINDEX, TROPONINI in the last 168 hours. BNP (last 3 results) No results for input(s): PROBNP in the last 8760 hours. HbA1C: No results for input(s): HGBA1C in the last 72 hours. CBG: No results for input(s): GLUCAP in the last 168 hours. Lipid Profile: No results for input(s): CHOL, HDL, LDLCALC, TRIG, CHOLHDL, LDLDIRECT in the last 72 hours. Thyroid Function Tests: No results for input(s): TSH, T4TOTAL, FREET4, T3FREE, THYROIDAB in the last 72 hours. Anemia Panel: No results for input(s): VITAMINB12, FOLATE, FERRITIN, TIBC, IRON, RETICCTPCT in the last 72 hours. Urine analysis:    Component Value Date/Time   COLORURINE YELLOW 07/27/2011 1155   APPEARANCEUR CLEAR 07/27/2011 1155   LABSPEC 1.025 07/27/2011 1155   PHURINE 5.5 07/27/2011 1155   GLUCOSEU NEGATIVE 07/27/2011 1155   HGBUR NEGATIVE 07/27/2011 1155    BILIRUBINUR NEGATIVE 07/27/2011 1155   KETONESUR NEGATIVE 07/27/2011 1155   PROTEINUR NEGATIVE 07/27/2011 1155   UROBILINOGEN 0.2 07/27/2011 1155   NITRITE NEGATIVE 07/27/2011 1155   LEUKOCYTESUR NEGATIVE 07/27/2011 1155   Sepsis Labs: @LABRCNTIP (procalcitonin:4,lacticidven:4) )No results found for this or any previous visit (from the past 240 hour(s)).   Radiological Exams on Admission: Dg Chest 2 View  08/16/2015  CLINICAL DATA:  Dizziness and shortness of breath with chest tightness for 4 hours. EXAM: CHEST  2 VIEW COMPARISON:  08/07/2015 FINDINGS: The heart size and mediastinal contours are within normal limits. Both lungs are clear. The visualized skeletal structures are unremarkable. IMPRESSION: No active cardiopulmonary disease. Electronically Signed   By: Kennith Center M.D.   On: 08/16/2015 19:11     EKG: Independently reviewed. Sinus rhythm, QTC 412, PVC, mild T-wave inversion in the 5-V6 and III  Assessment/Plan Principal Problem:   Chest pain Active Problems:   Morbid obesity (HCC)   Mixed dyslipidemia   DM type 2 with diabetic dyslipidemia (HCC)   AKI (acute kidney injury) (HCC)   Chronic systolic CHF (congestive heart failure) (HCC)   Numbness of left hand  Chest pain: he has atypical chest pain, etiology is not clear. Patient had cardiac cath on 07/31/15, which showed Mid RCA lesion, 35% stenosed. Currently patient does not have shortness breath or oxygen desaturation, no signs of DVT, less likely to have PE. Chest x-ray is negative for infiltration. Will work up for chest pain rule out.  - will place on Tele bed for obs - cycle CE q6 x3 and repeat her EKG in the am  - Nitroglycerin, Morphine, and aspirin, lipitor - continue home low dose of coreg, 1.56 mg bid (pt has bradycardia on admission, holding parameter was placed for HR<60) - Risk factor stratification: will check FLP and A1C  - 2d echo - please call Card in AM  Left hand numbness and weakness: Etiology  is not clear. Will need to r/o stroke. -MRI-brain -on ASA  DM-II: Last A1c 8.8 on 07/30/15, poorly controled. Patient is taking metformin at home -SSI -Check A1c  Mixed dyslipidemia: -Continue home medications: Lipitor and fenofibrate -FLP  AKI (acute kidney injury): Cre 1.25 and BUN 39. Likely due to prerenal secondary to dehydration and continuation of ACEI and diruetics - Check FeUrea -  Follow up renal function by BMP - Hold lisinopril, HCTZ and lasix   Chronic combined systolic and diastolic CHF: 2-D echo on 08/01/15 showed EF 30-35% with grade 2 diastolic dysfunction. Pt has trace leg edema, no JVD. CHF is compensated. -Hold HCTZ, lasix and lisinoprildue to AKI-->start Amlodipine with holding parameter -Check BNP -Continue aspirin and Coreg   DVT ppx: SQ Heparin  Code Status: Full code Family Communication: Yes, patient's wife at bed side Disposition Plan:  Anticipate discharge back to previous home environment Consults called:  none Admission status: Obs / tele   Date of Service 08/17/2015    Lorretta Harp [facility] Pager [phone]  If 7PM-7AM, please contact night-coverage [email or web address] Password [email or web address] 08/17/2015, 3:04 AM

## 2015-08-16 NOTE — Progress Notes (Signed)
Cardiology Office Note    Date:  08/16/2015   ID:  Matthew Sexton, [place] 21-Sep-1970, MRN [id number]  PCP:  [name], MD  Cardiologist:  Dr. Tresa Endo  Chief Complaint: Hospital follow up for unstable angina  History of Present Illness:   Matthew Sexton is a 45 y.o. male with newly diagnosed NICM and systolic CHF, DM, HLD and possible sleep apnea who presented for hospital follow up.   Admitted 07/30/15-08/03/15 for unstable angina. Troponin flat low trend. Given CAD risk factors --> strong family hx, untreated DM, and runs of NSVT leading to high pretest probability of CAD. It wad felt that noninvasive imaging would not adequately rule out ischemia. Cath showed normal coronaries with 35% mid RCA, elevated LVEDP. EF of 35% on cath. Given IV lasix x 1. Chest pressure with ambulation initially and then improved. Offer cardiac rehab however he is not interested currently.Echo showed EF of 30-35%, global hypokinesis, grade 2 DD with markedly elevated LV filling pressures, normal LA size, normal IVC. Normal BNP. TG elevated at 415, TC 157, HDL 30 with inability to calculate LDL. His HF medications was up titrated. His heart rate persisted in mid 40s to low 50s despite holding coreg. Eventually placed on coreg to 1/2 of 3.125 mg BID. Started on metformin at discharged and referred for sleep study with Dr. Tresa Endo. Discharged home on stable condition.   The was seen in ER 08/07/15 for chest pain felt like pressure and associated with SOB and diaphoresis. Minimal relief with SL nitro x 1. His symptoms improved on morphine and lasix. Troponin x 2 negative. BNP was normal. EKG sinus rhythm with arrhthymias.  Discussed the case with cardiology on-call. He was placed on HCTZ and discharged home from ER.   Presented today for follow up. Still having intermittent chest pain described as "twiching" and shortness of breath, both improving. Still feels weak and dizzy. No syncope or LE edema. No melena or blood in his stool.    Past Medical History  Diagnosis Date  . Diabetes (HCC)   . CHF (congestive heart failure) (HCC)   . Obesity   . Non-ischemic cardiomyopathy (HCC)     Echo 08/01/15 - EF of 30-35%, global hypokinesis, grade 2 DD. Cath 07/31/15 - normal coronaries with 35% mid RCA  . Bradycardia   . HLD (hyperlipidemia)   . Snoring   . Chronic systolic CHF (congestive heart failure) (HCC)   . Unstable angina [facility])     Past Surgical History  Procedure Laterality Date  . Appendectomy    . Cardiac catheterization N/A 07/31/2015    Procedure: Left Heart Cath and Coronary Angiography;  Surgeon: Marykay Lex, MD;  Location: [facility] INVASIVE CV LAB;  Service: Cardiovascular;  Laterality: N/A;    Current Medications: Prior to Admission medications   Medication Sig Start Date End Date Taking? Authorizing Provider  aspirin EC 81 MG EC tablet Take 1 tablet (81 mg total) by mouth daily. 08/03/15   Rosco Harriott, PA  atorvastatin (LIPITOR) 40 MG tablet Take 1 tablet (40 mg total) by mouth daily at 6 PM. 08/03/15   Alanzo Lamb, PA  carvedilol (COREG) 3.125 MG tablet Take 0.5 tablets (1.56 mg total) by mouth 2 (two) times daily with a meal. 08/03/15   Ranie Chinchilla, PA  fenofibrate 160 MG tablet Take 1 tablet (160 mg total) by mouth daily. 08/03/15   Sharde Gover, PA  hydrochlorothiazide (HYDRODIURIL) 25 MG tablet Take 1 tablet (25 mg total) by mouth daily. 08/07/15  Rolland Porter, MD  lisinopril (PRINIVIL,ZESTRIL) 10 MG tablet Take 1 tablet (10 mg total) by mouth daily. 08/03/15   Anaja Monts, PA  metFORMIN (GLUCOPHAGE) 1000 MG tablet Take 1 tablet (1,000 mg total) by mouth daily after supper. 08/03/15   Domonic Hiscox, PA  nitroGLYCERIN (NITROSTAT) 0.4 MG SL tablet Place 1 tablet (0.4 mg total) under the tongue every 5 (five) minutes as needed for chest pain. 08/03/15   Mansoor Hillyard, PA  omega-3 acid ethyl esters (LOVAZA) 1 g capsule Take 2 capsules (2 g total) by mouth 2 (two) times  daily. 08/03/15   Manson Passey, PA    Allergies:   Review of patient's allergies indicates no known allergies.   Social History   Social History  . Marital Status: Married    Spouse Name: N/A  . Number of Children: N/A  . Years of Education: N/A   Social History Main Topics  . Smoking status: Former Games developer  . Smokeless tobacco: None  . Alcohol Use: No  . Drug Use: No  . Sexual Activity: Not Asked   Other Topics Concern  . None   Social History Narrative     Family History:  The patient's family history includes Angina (age of onset: 56) in his sister; Coronary artery disease (age of onset: 39) in his brother; Heart attack (age of onset: 80) in his father.   ROS:   Please see the history of present illness.    ROS All other systems reviewed and are negative.   PHYSICAL EXAM:   VS:  BP 120/70 mmHg  Pulse 62  Ht 6\' 2"  (1.88 m)  Wt 325 lb 3.2 oz (147.51 kg)  BMI 41.74 kg/m2   GEN: Well nourished, well developed, in no acute distress HEENT: normal Neck: no JVD, carotid bruits, or masses Cardiac: RRR; no murmurs, rubs, or gallops,no edema  Respiratory:  clear to auscultation bilaterally, normal work of breathing GI: soft, nontender, nondistended, + BS MS: no deformity or atrophy Skin: warm and dry, no rash Neuro:  Alert and Oriented x 3, Strength and sensation are intact Psych: euthymic mood, full affect  Wt Readings from Last 3 Encounters:  08/16/15 325 lb 3.2 oz (147.51 kg)  08/07/15 327 lb (148.326 kg)  08/03/15 328 lb 14.4 oz (149.188 kg)      Studies/Labs Reviewed:   EKG:  EKG is not  ordered today.    Recent Labs: 07/30/2015: TSH 0.517 08/03/2015: Magnesium 1.9 08/07/2015: B Natriuretic Peptide 15.9; BUN 31*; Creatinine, Ser 1.26*; Hemoglobin 14.3; Platelets 256; Potassium 4.6; Sodium 134*   Lipid Panel    Component Value Date/Time   CHOL 157 07/31/2015 0428   TRIG 415* 07/31/2015 0428   HDL 30* 07/31/2015 0428   CHOLHDL 5.2 07/31/2015 0428    VLDL UNABLE TO CALCULATE IF TRIGLYCERIDE OVER 400 mg/dL 16/11/9602 5409   LDLCALC UNABLE TO CALCULATE IF TRIGLYCERIDE OVER 400 mg/dL 81/19/1478 2956    Additional studies/ records that were reviewed today include:   Echocardiogram: 08/01/15  LV EF: 30% - 35%  ------------------------------------------------------------------- History: PMH: Cardiomyopathy. Coronary artery disease.  ------------------------------------------------------------------- Study Conclusions  - Procedure narrative: Transthoracic echocardiography. Image  quality was adequate. The study was technically difficult, as a  result of body habitus. Definity contrast administered. - Left ventricle: The cavity size was normal. Wall thickness was  normal. Systolic function was moderately to severely reduced. The  estimated ejection fraction was in the range of 30% to 35%. No  mural thrombus seen with Definity contrast.  Diffuse hypokinesis.  Doppler parameters are consistent with pseudonormal left  ventricular relaxation (grade 2 diastolic dysfunction). The E/A  ratio is >1.5. The E/e&' ratio is >20, suggesting markedly  elevated LV filling pressure. - Left atrium: The atrium was normal in size. - Tricuspid valve: There was no significant regurgitation. - Inferior vena cava: The vessel was normal in size. The  respirophasic diameter changes were in the normal range (>= 50%),  consistent with normal central venous pressure.  Impressions:  - Technically difficult study. Definity contrast given. LVEF  30-35%, global hypokinesis, grade 2 DD with markedly elevated LV  filling pressures, normal LA size, normal IVC.  Cardiac Catheterization: 07/31/15  1. 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 2. There is moderate left ventricular systolic dysfunction. 3. Severely elevated LVEDP.  Angiographic normal coronary arteries. Suspect that his unstable angina symptoms may very well be related to elevated LVEDP  with cardiomyopathy.  Plan:  Return to nursing unit for ongoing care.  TR band removal per current protocol.  No further heparin as was no active coronary disease.  Will give 1 dose of IV Lasix this evening for elevated LVEDP. Within check 2-D echocardiogram in the morning to reassess function and filling pressures.  We'll need to titrate cardiac medications prior to discharge.   ASSESSMENT & PLAN:    1. NICM/Chronic systolic CHF - Echo 08/01/15 showed EF of 30-35%, global hypokinesis, grade 2 DD. Cath 07/31/15 showed normal coronaries with 35% mid RCA. Euvolemic today.  - Continue coreg, lisinopril, statin and aspirin. Continue recently added HCTZ, consider changing to spironolactone in future for mortality benefit. Will need echo in 3 months. Will give rx of PRN lasix.   2. HLD - Elevated TG and unable to calculate LDL.  - Continue Lipitor, lovaza and fibrates. Will need lipid panel and LFT in 4-6 weeks-- > he says he will follow up with PCP @ [facility].   3. DM - Continue metformin. F/u with PCP  4. Possible sleep apnea - Pending sleep study 09/12/15.  5. Morbid obesity (HCC) - Encouraged life style changes. Weight loss, healthy diet and regular exercise.   6. Unstable angina - Improving. Continue ASA, BB, statin, ace and PRN nitro.   7. AKI - SCr of 1.26 08/07/15 with BUN of 31. Will get BMET today.     Medication Adjustments/Labs and Tests Ordered: Current medicines are reviewed at length with the patient today.  Concerns regarding medicines are outlined above.  Medication changes, Labs and Tests ordered today are listed in the Patient Instructions below.  Patient Instructions  Medication Instructions:  START Lasix 40mg  take 1 tab as needed for shortness of breath or swelling in legs, feet or ankles.   Labwork: Complete a bmet today Have labs done with your primary care in 4-6 weeks  Testing/Procedures: None Ordered  Follow-Up: Your physician recommends that you  schedule a follow-up appointment in: 3 months with Dr Tresa Endo   Any Other Special Instructions Will Be Listed Below (If Applicable).     If you need a refill on your cardiac medications before your next appointment, please call your pharmacy.      Lorelei Pont, [place]  08/16/2015 9:47 AM    [facility] [address] [place], [place], [place]  [address] Phone: [phone]; Fax: [phone]

## 2015-08-16 NOTE — ED Notes (Signed)
Pt reports that when he was on his way to the hospital he felt like his left hand "was asleep, I could feel it getting numb, the whole hand was getting numb, I had to flex it, now it's just the finger tips and the thumb" Pt also reports decreased sensation to the left arm. Dr. Clydene Pugh aware. No new orders at this time.

## 2015-08-16 NOTE — ED Notes (Signed)
C/o generalized malaise since waking this morning. This afternoon he began to feel heaviness in his head, heart skipping beats, tightness in chest, weakness all over his body. He is alert, breathing easily

## 2015-08-16 NOTE — Patient Instructions (Addendum)
Medication Instructions:  START Lasix 40mg  take 1 tab as needed for shortness of breath or swelling in legs, feet or ankles.   Labwork: Complete a bmet today Have labs done with your primary care in 4-6 weeks  Testing/Procedures: None Ordered  Follow-Up: Your physician recommends that you schedule a follow-up appointment in: 3 months with Dr Tresa Endo   Any Other Special Instructions Will Be Listed Below (If Applicable).     If you need a refill on your cardiac medications before your next appointment, please call your pharmacy.

## 2015-08-16 NOTE — ED Notes (Signed)
Dr.Knott at bedside. 

## 2015-08-16 NOTE — ED Notes (Signed)
Message sent to pharmacy requesting coreg.   

## 2015-08-16 NOTE — ED Provider Notes (Signed)
CSN: [id number]     Arrival date & time 08/16/15  1748 History   First MD Initiated Contact with Patient 08/16/15 1924     Chief Complaint  Patient presents with  . Chest Pain     (Consider location/radiation/quality/duration/timing/severity/associated sxs/prior Treatment) Patient is a 45 y.o. male presenting with chest pain. The history is provided by the patient.  Chest Pain Pain location:  Substernal area Pain quality: pressure   Pain radiates to:  L shoulder Pain radiates to the back: no   Pain severity:  Moderate Onset quality:  Gradual Duration:  2 minutes Timing:  Intermittent (at 5PM) Progression:  Waxing and waning Associated symptoms: diaphoresis and fatigue (sudden onset at 230pm)   Associated symptoms comment:  Legs and arms felt heavy   Past Medical History  Diagnosis Date  . Diabetes (HCC)   . CHF (congestive heart failure) (HCC)   . Obesity   . Non-ischemic cardiomyopathy (HCC)     Echo 08/01/15 - EF of 30-35%, global hypokinesis, grade 2 DD. Cath 07/31/15 - normal coronaries with 35% mid RCA  . Bradycardia   . HLD (hyperlipidemia)   . Snoring   . Chronic systolic CHF (congestive heart failure) (HCC)   . Unstable angina [facility] [place] [facility])    Past Surgical History  Procedure Laterality Date  . Appendectomy    . Cardiac catheterization N/A 07/31/2015    Procedure: Left Heart Cath and Coronary Angiography;  Surgeon: Marykay Lex, MD;  Location: [facility] INVASIVE CV LAB;  Service: Cardiovascular;  Laterality: N/A;   Family History  Problem Relation Age of Onset  . Heart attack Father 41    Died early 16's   . Coronary artery disease Brother 21    STENTS  . Angina Sister 22    Chronic   Social History  Substance Use Topics  . Smoking status: Former Games developer  . Smokeless tobacco: None  . Alcohol Use: No    Review of Systems  Constitutional: Positive for diaphoresis and fatigue (sudden onset at 230pm).  Cardiovascular: Positive for chest pain.  All other systems  reviewed and are negative.     Allergies  Review of patient's allergies indicates no known allergies.  Home Medications   Prior to Admission medications   Medication Sig Start Date End Date Taking? Authorizing Provider  aspirin EC 81 MG EC tablet Take 1 tablet (81 mg total) by mouth daily. 08/03/15   Bhavinkumar Bhagat, PA  atorvastatin (LIPITOR) 40 MG tablet Take 1 tablet (40 mg total) by mouth daily at 6 PM. 08/03/15   Bhavinkumar Bhagat, PA  carvedilol (COREG) 3.125 MG tablet Take 0.5 tablets (1.56 mg total) by mouth 2 (two) times daily with a meal. 08/03/15   Bhavinkumar Bhagat, PA  fenofibrate 160 MG tablet Take 1 tablet (160 mg total) by mouth daily. 08/03/15   Manson Passey, PA  furosemide (LASIX) 40 MG tablet Take 1 tab by mouth as needed for shortness of breath or edema in legs, feet or ankles 08/16/15   Azalee Course, PA  hydrochlorothiazide (HYDRODIURIL) 25 MG tablet Take 1 tablet (25 mg total) by mouth daily. 08/07/15   Rolland Porter, MD  lisinopril (PRINIVIL,ZESTRIL) 10 MG tablet Take 1 tablet (10 mg total) by mouth daily. 08/03/15   Bhavinkumar Bhagat, PA  metFORMIN (GLUCOPHAGE) 1000 MG tablet Take 1 tablet (1,000 mg total) by mouth daily after supper. 08/03/15   Bhavinkumar Bhagat, PA  nitroGLYCERIN (NITROSTAT) 0.4 MG SL tablet Place 0.4 mg under the tongue every 5 (five)  minutes as needed for chest pain (3 DOSES MAX).    Historical Provider, MD  omega-3 acid ethyl esters (LOVAZA) 1 g capsule Take 2 capsules (2 g total) by mouth 2 (two) times daily. 08/03/15   Bhavinkumar Bhagat, PA   BP 125/69 mmHg  Pulse 56  Temp(Src) 98.5 F (36.9 C) (Oral)  Resp 26  SpO2 98% Physical Exam  Constitutional: He is oriented to person, place, and time. He appears well-developed and well-nourished. No distress.  HENT:  Head: Normocephalic and atraumatic.  Eyes: Conjunctivae are normal.  Neck: Neck supple. No tracheal deviation present.  Cardiovascular: Normal rate, regular rhythm and normal heart  sounds.   Pulmonary/Chest: Effort normal and breath sounds normal. No respiratory distress.  Abdominal: Soft. He exhibits no distension.  Neurological: He is alert and oriented to person, place, and time.  Skin: Skin is warm and dry.  Psychiatric: He has a normal mood and affect.  Vitals reviewed.   ED Course  Procedures (including critical care time) Labs Review Labs Reviewed  BASIC METABOLIC PANEL - Abnormal; Notable for the following:    Sodium 133 (*)    Chloride 100 (*)    Glucose, Bld 179 (*)    BUN 39 (*)    Creatinine, Ser 1.25 (*)    All other components within normal limits  CBC  I-STAT TROPOININ, ED    Imaging Review Dg Chest 2 View  08/16/2015  CLINICAL DATA:  Dizziness and shortness of breath with chest tightness for 4 hours. EXAM: CHEST  2 VIEW COMPARISON:  08/07/2015 FINDINGS: The heart size and mediastinal contours are within normal limits. Both lungs are clear. The visualized skeletal structures are unremarkable. IMPRESSION: No active cardiopulmonary disease. Electronically Signed   By: Kennith Center M.D.   On: 08/16/2015 19:11   I have personally reviewed and evaluated these images and lab results as part of my medical decision-making.   EKG Interpretation   Date/Time:  Wednesday August 16 2015 17:55:18 EDT Ventricular Rate:  79 PR Interval:  182 QRS Duration: 90 QT Interval:  360 QTC Calculation: 412 R Axis:   71 Text Interpretation:  Sinus rhythm with frequent Premature ventricular  complexes Abnormal ECG Nonspecific T wave abnormality now evident in  Inferolateral leads Otherwise no significant change Confirmed by Serria Sloma MD,  Reuel Boom ([id number]) on 08/16/2015 7:17:46 PM      MDM   Final diagnoses:  Chest pain, unspecified chest pain type    45 y.o. male presents with sudden onset episode of diaphoresis this afternoon after going to cardiology OP visit. He went to lay down and experienced substernal chest pressure that radiated to his left shoulder  occuring intermittently and continuing after arrival to ED. Intermittent episodes of sinus bradycardia. Nonspecific inferior lead abnormalities today in setting of known 35% RCA disease on recent cath. D/w cardiology after troponin returned negative, recommending medical admission for ACS r/o Hospitalist was consulted for admission and will see the patient in the emergency department.     Lyndal Pulley, MD 08/17/15 [phone]

## 2015-08-17 ENCOUNTER — Observation Stay (HOSPITAL_COMMUNITY): Payer: MEDICAID

## 2015-08-17 ENCOUNTER — Other Ambulatory Visit (HOSPITAL_COMMUNITY): Payer: Self-pay

## 2015-08-17 ENCOUNTER — Encounter (HOSPITAL_COMMUNITY): Payer: Self-pay | Admitting: *Deleted

## 2015-08-17 DIAGNOSIS — I4729 Other ventricular tachycardia: Secondary | ICD-10-CM | POA: Insufficient documentation

## 2015-08-17 DIAGNOSIS — I472 Ventricular tachycardia: Secondary | ICD-10-CM | POA: Insufficient documentation

## 2015-08-17 DIAGNOSIS — E1169 Type 2 diabetes mellitus with other specified complication: Secondary | ICD-10-CM

## 2015-08-17 DIAGNOSIS — I429 Cardiomyopathy, unspecified: Secondary | ICD-10-CM

## 2015-08-17 DIAGNOSIS — E785 Hyperlipidemia, unspecified: Secondary | ICD-10-CM

## 2015-08-17 DIAGNOSIS — I493 Ventricular premature depolarization: Secondary | ICD-10-CM | POA: Diagnosis present

## 2015-08-17 DIAGNOSIS — I5042 Chronic combined systolic (congestive) and diastolic (congestive) heart failure: Secondary | ICD-10-CM

## 2015-08-17 LAB — LIPID PANEL
CHOL/HDL RATIO: 3 ratio
Cholesterol: 104 mg/dL (ref 0–200)
HDL: 35 mg/dL — ABNORMAL LOW (ref >40)
LDL CALC: 49 mg/dL (ref 0–99)
Triglycerides: 100 mg/dL (ref <150)
VLDL: 20 mg/dL (ref 0–40)

## 2015-08-17 LAB — HEMOGLOBIN A1C
Hgb A1c MFr Bld: 8 % — ABNORMAL HIGH (ref 4.8–5.6)
Mean Plasma Glucose: 183 mg/dL

## 2015-08-17 LAB — RAPID URINE DRUG SCREEN, HOSP PERFORMED
Amphetamines: NOT DETECTED
Barbiturates: NOT DETECTED
Benzodiazepines: NOT DETECTED
COCAINE: NOT DETECTED
OPIATES: NOT DETECTED
TETRAHYDROCANNABINOL: NOT DETECTED

## 2015-08-17 LAB — GLUCOSE, CAPILLARY
GLUCOSE-CAPILLARY: 176 mg/dL — AB (ref 65–99)
GLUCOSE-CAPILLARY: 171 mg/dL — AB (ref 65–99)
Glucose-Capillary: 143 mg/dL — ABNORMAL HIGH (ref 65–99)
Glucose-Capillary: 270 mg/dL — ABNORMAL HIGH (ref 65–99)

## 2015-08-17 LAB — TROPONIN I: Troponin I: <0.03 ng/mL (ref <0.03)

## 2015-08-17 LAB — BRAIN NATRIURETIC PEPTIDE: B NATRIURETIC PEPTIDE 5: 21.7 pg/mL (ref 0.0–100.0)

## 2015-08-17 MED ORDER — INSULIN ASPART 100 UNIT/ML ~~LOC~~ SOLN
0.0000 [IU] | Freq: Three times a day (TID) | SUBCUTANEOUS | Status: DC
  Administered 2015-08-17: 2 [IU] via SUBCUTANEOUS
  Administered 2015-08-17: 1 [IU] via SUBCUTANEOUS
  Administered 2015-08-18: 3 [IU] via SUBCUTANEOUS
  Administered 2015-08-18: 2 [IU] via SUBCUTANEOUS
  Administered 2015-08-18: 3 [IU] via SUBCUTANEOUS
  Administered 2015-08-19: 2 [IU] via SUBCUTANEOUS
  Administered 2015-08-19: 5 [IU] via SUBCUTANEOUS
  Administered 2015-08-19: 2 [IU] via SUBCUTANEOUS

## 2015-08-17 MED ORDER — AMIODARONE HCL 200 MG PO TABS
400.0000 mg | ORAL_TABLET | Freq: Two times a day (BID) | ORAL | Status: DC
Start: 2015-08-17 — End: 2015-08-21
  Administered 2015-08-17 – 2015-08-21 (×8): 400 mg via ORAL
  Filled 2015-08-17 (×8): qty 2

## 2015-08-17 MED ORDER — CARVEDILOL 3.125 MG PO TABS
1.5600 mg | ORAL_TABLET | Freq: Two times a day (BID) | ORAL | Status: DC
  Filled 2015-08-17: qty 1

## 2015-08-17 MED ORDER — INSULIN ASPART 100 UNIT/ML ~~LOC~~ SOLN
0.0000 [IU] | Freq: Every day | SUBCUTANEOUS | Status: DC
  Administered 2015-08-17: 3 [IU] via SUBCUTANEOUS

## 2015-08-17 NOTE — Consult Note (Signed)
CARDIOLOGY CONSULT NOTE     Primary Care Physician: [name], MD Referring Physician:  Admit Date: 08/16/2015  Reason for consultation:  Matthew Sexton is a 45 y.o. male with a history of recently diagnosed NICM and chronic combined CHF of unclear etiology, NSVT, bradycardia, DM, HLD, hypertriglyceridemia, possible sleep apnea, morbid obesity who presented back to [facility] with complaints of chest pain, fatigue.  In June 2017 he was admitted for concern for [place] with low trending troponin. He had runs of NSVT on telemetry. LHC 07/31/15: 35% mRCA, otherwise normal cors, LVEF 35-45% with severely elevated LVEDP - suspected his CP was related to his CHF and he was treated with diuresis.  2D Echo 07/31/15: EF 30-35%, no thrombus seen, diffuse HK, grade 2 DD. He was noted to have bradycardia with HR in the mid 40s to low 50s despite holding Coreg. He was started on metformin for his DM along with baby dose Coreg (1/2 of 3.125), lisinopril, statin, spironolactone, and aspirin with plans for outpt sleep study. K and Mg were normal during that admission during episodes of NSVT.  He was seen in the office yesterday and was still feeling poorly at that time but was hemodynamically stable. PRN Lasix was advised. He left and went to his office and began feeling even worse with bilateral hand numbness, arm heaviness, and episodic chest pains lasting 10sec to up to a minute at at time. This was not worse with inspiration, exertion, or position changes. Also had palpitations. He began to feel intermittently dizzy as well as disoriented. He had some associated SOB. No syncope, vomiting, or LEE.His wife thought he looked gray so brought him to the ER. He was still feeling somewhat strange when he got here. BP 101/82, HR 67, pulse ox 99% RA. On telemetry he is noted to have sinus rhythm with intermittent bradycardia into the upper 30s/low 40s, as well as frequent PVCs and a 6 beat run of NSVT.   Today, he states that he  feels much improved.  His palpitations have gotten much better and he is quite a bit less fatigued.  He has also not been having chest pain.  Past Medical History  Diagnosis Date  . Diabetes mellitus (HCC)   . Obesity   . Non-ischemic cardiomyopathy (HCC)     a. Adm 07/2015 with CP - Echo 08/01/15 - EF of 30-35%, global hypokinesis, grade 2 DD. LHC 07/31/15: 35% mRCA, otherwise normal cors, LVEF 35-45% with severely elevated LVEDP - it was suspected his CP was related to his CHF.  . Bradycardia   . HLD (hyperlipidemia)   . Snoring   . Chronic combined systolic and diastolic CHF (congestive heart failure) (HCC)   . NSVT (nonsustained ventricular tachycardia) (HCC)   . Suspected sleep apnea    Past Surgical History  Procedure Laterality Date  . Appendectomy    . Cardiac catheterization N/A 07/31/2015    Procedure: Left Heart Cath and Coronary Angiography;  Surgeon: Marykay Lex, MD;  Location: [facility] INVASIVE CV LAB;  Service: Cardiovascular;  Laterality: N/A;    . amiodarone  400 mg Oral BID  . amLODipine  5 mg Oral Daily  . aspirin EC  81 mg Oral Daily  . atorvastatin  40 mg Oral q1800  . fenofibrate  160 mg Oral Daily  . heparin  5,000 Units Subcutaneous Q8H  . insulin aspart  0-5 Units Subcutaneous QHS  . insulin aspart  0-9 Units Subcutaneous TID WC  . omega-3 acid ethyl esters  2 g Oral BID      No Known Allergies  Social History   Social History  . Marital Status: Married    Spouse Name: N/A  . Number of Children: N/A  . Years of Education: N/A   Occupational History  . Pastor    Social History Main Topics  . Smoking status: Former Games developer  . Smokeless tobacco: Never Used     Comment: QUIT SMOKING AT AGE 75  . Alcohol Use: No  . Drug Use: No  . Sexual Activity: Not on file   Other Topics Concern  . Not on file   Social History Narrative    Family History  Problem Relation Age of Onset  . Heart attack Father 64    Died early 55's   . Coronary artery  disease Brother 65    STENTS  . Angina Sister 28    Chronic    ROS- All systems are reviewed and negative except as per the HPI above  Physical Exam: Telemetry: Filed Vitals:   08/17/15 0807 08/17/15 1049 08/17/15 1157 08/17/15 2018  BP: 101/63 109/46 109/57 129/63  Pulse: 51  52 55  Temp:   97.6 F (36.4 C) 98 F (36.7 C)  TempSrc:   Oral Oral  Resp:   18 20  Height:      Weight:      SpO2:   98% 95%    GEN- The patient is well appearing, alert and oriented x 3 today.   Head- normocephalic, atraumatic Eyes-  Sclera clear, conjunctiva pink Ears- hearing intact Oropharynx- clear Neck- supple, no JVP Lymph- no cervical lymphadenopathy Lungs- Clear to ausculation bilaterally, normal work of breathing Heart- Regular rate and rhythm, intermittent early beats, no murmurs, rubs or gallops, PMI not laterally displaced GI- soft, NT, ND, + BS Extremities- no clubbing, cyanosis, or edema MS- no significant deformity or atrophy Skin- no rash or lesion Psych- euthymic mood, full affect Neuro- strength and sensation are intact  EKG: sinus rhythm with PVCs  Labs:   Lab Results  Component Value Date   WBC 9.9 08/16/2015   HGB 13.5 08/16/2015   HCT 39.6 08/16/2015   MCV 88.6 08/16/2015   PLT 263 08/16/2015    Recent Labs Lab 08/16/15 1806  NA 133*  K 3.9  CL 100*  CO2 25  BUN 39*  CREATININE 1.25*  CALCIUM 9.5  GLUCOSE 179*   Lab Results  Component Value Date   TROPONINI <0.03 08/17/2015    Lab Results  Component Value Date   CHOL 104 08/17/2015   CHOL 157 07/31/2015   Lab Results  Component Value Date   HDL 35* 08/17/2015   HDL 30* 07/31/2015   Lab Results  Component Value Date   LDLCALC 49 08/17/2015   LDLCALC UNABLE TO CALCULATE IF TRIGLYCERIDE OVER 400 mg/dL 94/76/5465   Lab Results  Component Value Date   TRIG 100 08/17/2015   TRIG 415* 07/31/2015   Lab Results  Component Value Date   CHOLHDL 3.0 08/17/2015   CHOLHDL 5.2 07/31/2015   No  results found for: LDLDIRECT    Radiology: No active cardiopulmonary disease.  Echo: - Left ventricle: The cavity size was normal. Wall thickness was  normal. Systolic function was moderately to severely reduced. The  estimated ejection fraction was in the range of 30% to 35%. No  mural thrombus seen with Definity contrast. Diffuse hypokinesis.  Doppler parameters are consistent with pseudonormal left  ventricular relaxation (grade 2 diastolic dysfunction).  The E/A  ratio is >1.5. The E/e&' ratio is >20, suggesting markedly  elevated LV filling pressure. - Left atrium: The atrium was normal in size. - Tricuspid valve: There was no significant regurgitation. - Inferior vena cava: The vessel was normal in size. The  respirophasic diameter changes were in the normal range (>= 50%),  consistent with normal central venous pressure.  Cardiac Cath 1. 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 2. There is moderate left ventricular systolic dysfunction. 3. Severely elevated LVEDP.  Angiographic normal coronary arteries. Suspect that his unstable angina symptoms may very well be related to elevated LVEDP with cardiomyopathy.  ASSESSMENT AND PLAN:   1. PVCs: possibly causing his cardiomyopathy.  On his ECG, they are monomorphic, and appear to be coming from the lateral LV.  His medication options are limited by is low EF.  Sotalol would likely not be appropriate due to his bradycardia.  Jamarquis Crull try amiodarone to see if his PVCs are reduced.  Would be a candidate for ablation in the future if the amiodarone does not decrease his PVC burden.  If he continues to be bradycardic, a pacemaker may be warranted.     Lamichael Youkhana Jorja Loa, MD 08/17/2015  8:42 PM

## 2015-08-17 NOTE — Progress Notes (Signed)
PROGRESS NOTE  Matthew Sexton DGL:[id number] DOB: 09-26-1970 DOA: 08/16/2015 PCP: [name], MD  HPI/Recap of past 24 hours: 45 y.o. 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  This AM seen with family at the bedside, he is still having chest pain on and off. Describes presyncope yesterday.   Assessment/Plan: Chest pain: he has atypical chest pain, etiology is not clear. Patient had cardiac cath on 07/31/15, which showed Mid RCA lesion, 35% stenosed. Currently patient does not have shortness breath or oxygen desaturation, no signs of DVT, less likely to have PE. Chest x-ray is negative for infiltration. Will work up for chest pain rule out. - he is on Tele bed for obs - cycle CE q6 x3 and repeat her EKG in the am  - Nitroglycerin, Morphine, and aspirin, lipitor - continue home low dose of coreg, 1.56 mg bid (pt has bradycardia on admission, holding parameter was placed for HR<60) - Risk factor stratification: will check FLP and A1C  - 2d echo - cardiology has been consulted  Left hand numbness and weakness: Etiology is not clear. Will need to r/o stroke. -MRI-brain pending -on ASA  DM-II: Last A1c 8.8 on 07/30/15, poorly controled. Patient is taking metformin at home -SSI -Check A1c  Mixed dyslipidemia: -Continue home medications: Lipitor and fenofibrate -FLP  AKI (acute kidney injury): Cre 1.25 and BUN 39. Likely due to prerenal secondary to dehydration and continuation of ACEI and diruetics - Check FeUrea - Follow up renal function by BMP - Hold lisinopril, HCTZ and lasix   Chronic combined systolic and diastolic CHF: 2-D echo on 08/01/15 showed EF 30-35% with grade 2 diastolic dysfunction. Pt has trace leg edema, no JVD. CHF is compensated. -Hold HCTZ, lasix and lisinoprildue to  AKI-->start Amlodipine with holding parameter -Check BNP -Continue aspirin and Coreg  DVT ppx: SQ Heparin  Code Status: Full code Family Communication: Yes, patient's wife at bed side Disposition Plan: Anticipate discharge back to previous home environment Consults called: cardiology Admission status: Obs / tele    Objective: Filed Vitals:   08/17/15 0021 08/17/15 0030 08/17/15 0116 08/17/15 0807  BP:  109/65 114/54 101/63  Pulse:  56 54 51  Temp: 97.8 F (36.6 C)  97.8 F (36.6 C)   TempSrc:   Oral   Resp:  18 21   Height:   6\' 2"  (1.88 m)   Weight:   140.524 kg (309 lb 12.8 oz)   SpO2:  100% 98%     Intake/Output Summary (Last 24 hours) at 08/17/15 5188 Last data filed at 08/17/15 0745  Gross per 24 hour  Intake      0 ml  Output    995 ml  Net   -995 ml   Filed Weights   08/17/15 0116  Weight: 140.524 kg (309 lb 12.8 oz)    Exam: General:  Alert, oriented, calm, in no acute distress, worried appearing Eyes: pupils round and reactive to light and accomodation, clear sclerea Neck: supple, no masses, trachea mildline  Cardiovascular: RRR, no murmurs or rubs, no peripheral edema  Respiratory: clear to auscultation bilaterally, no wheezes, no crackles  Abdomen: soft, nontender, nondistended, normal bowel tones heard  Skin: dry, no rashes  Musculoskeletal: no joint effusions, normal range of motion  Psychiatric: appropriate affect, normal speech  Neurologic: extraocular muscles intact, clear speech, moving all  extremities with intact sensorium    Data Reviewed: CBC:  Recent Labs Lab 08/16/15 1806  WBC 9.9  HGB 13.5  HCT 39.6  MCV 88.6  PLT 263   Basic Metabolic Panel:  Recent Labs Lab 08/16/15 0949 08/16/15 1806  NA 135 133*  K 4.6 3.9  CL 102 100*  CO2 25 25  GLUCOSE 255* 179*  BUN 37* 39*  CREATININE 1.24 1.25*  CALCIUM 9.7 9.5   GFR: Estimated Creatinine Clearance: 109 mL/min (by C-G formula based on Cr of 1.25). Liver Function  Tests: No results for input(s): AST, ALT, ALKPHOS, BILITOT, PROT, ALBUMIN in the last 168 hours. No results for input(s): LIPASE, AMYLASE in the last 168 hours. No results for input(s): AMMONIA in the last 168 hours. Coagulation Profile: No results for input(s): INR, PROTIME in the last 168 hours. Cardiac Enzymes:  Recent Labs Lab 08/17/15 0202 08/17/15 0648  TROPONINI <0.03 <0.03   BNP (last 3 results) No results for input(s): PROBNP in the last 8760 hours. HbA1C: No results for input(s): HGBA1C in the last 72 hours. CBG:  Recent Labs Lab 08/17/15 0538  GLUCAP 176*   Lipid Profile:  Recent Labs  08/17/15 0202  CHOL 104  HDL 35*  LDLCALC 49  TRIG 454  CHOLHDL 3.0   Thyroid Function Tests: No results for input(s): TSH, T4TOTAL, FREET4, T3FREE, THYROIDAB in the last 72 hours. Anemia Panel: No results for input(s): VITAMINB12, FOLATE, FERRITIN, TIBC, IRON, RETICCTPCT in the last 72 hours. Urine analysis:    Component Value Date/Time   COLORURINE YELLOW 07/27/2011 1155   APPEARANCEUR CLEAR 07/27/2011 1155   LABSPEC 1.025 07/27/2011 1155   PHURINE 5.5 07/27/2011 1155   GLUCOSEU NEGATIVE 07/27/2011 1155   HGBUR NEGATIVE 07/27/2011 1155   BILIRUBINUR NEGATIVE 07/27/2011 1155   KETONESUR NEGATIVE 07/27/2011 1155   PROTEINUR NEGATIVE 07/27/2011 1155   UROBILINOGEN 0.2 07/27/2011 1155   NITRITE NEGATIVE 07/27/2011 1155   LEUKOCYTESUR NEGATIVE 07/27/2011 1155   Sepsis Labs: (procalcitonin:4,lacticidven:4)  )No results found for this or any previous visit (from the past 240 hour(s)).    Studies: Dg Chest 2 View  08/16/2015  CLINICAL DATA:  Dizziness and shortness of breath with chest tightness for 4 hours. EXAM: CHEST  2 VIEW COMPARISON:  08/07/2015 FINDINGS: The heart size and mediastinal contours are within normal limits. Both lungs are clear. The visualized skeletal structures are unremarkable. IMPRESSION: No active cardiopulmonary disease.  Electronically Signed   By: Kennith Center M.D.   On: 08/16/2015 19:11    Scheduled Meds: . amLODipine  5 mg Oral Daily  . aspirin EC  81 mg Oral Daily  . atorvastatin  40 mg Oral q1800  . carvedilol  1.56 mg Oral BID WC  . fenofibrate  160 mg Oral Daily  . heparin  5,000 Units Subcutaneous Q8H  . insulin aspart  0-5 Units Subcutaneous QHS  . insulin aspart  0-9 Units Subcutaneous TID WC  . omega-3 acid ethyl esters  2 g Oral BID    Continuous Infusions:      Time spent: 24 minutes  Mir Vergie Living, MD [facility] Pager [phone]  If 7PM-7AM, please contact night-coverage [email or web address] Password [email or web address] 08/17/2015, 9:22 AM

## 2015-08-17 NOTE — Consult Note (Signed)
Cardiology Consultation Note    Patient ID: Matthew Sexton, MRN: [id number], DOB/AGE: June 01, 1970 45 y.o. Admit date: 08/16/2015   Date of Consult: 08/17/2015 Primary Physician: [name], MD Primary Cardiologist: Dr. Tresa Endo  Chief Complaint: chest pain, weakness, disorientation Reason for Consultation: chest pain, bradycardia Requesting MD: Dr. Clyde Lundborg  HPI: Matthew Sexton is a 45 y.o. male with history of recently diagnosed NICM and chronic combined CHF of unclear etiology, NSVT, bradycardia, DM, HLD, hypertriglyceridemia, possible sleep apnea, morbid obesity who presented back to [facility], [place] with complaints of chest pain.  In June 2017 he was admitted for concern for [place] with flat low trending troponin. He had runs of NSVT on telemetry. LHC 07/31/15: 35% mRCA, otherwise normal cors, LVEF 35-45% with severely elevated LVEDP - it was suspected his CP was related to his CHF and he was treated with diuresis. D-dimer was negative. 2D Echo 07/31/15: tech difficult, EF 30-35%, no thrombus seen, diffuse HK, grade 2 DD. He was noted to have bradycardia with HR in the mid 40s to low 50s despite holding Coreg. He was started on metformin for his DM along with baby dose Coreg (1/2 of 3.125), lisinopril, statin, spironolactone, and aspirin with plans for outpt sleep study. K and Mg were normal during that admission during episodes of NSVT.  He was seen in the office yesterday and was still feeling poorly at that time but was hemodynamically stable. PRN Lasix was advised. He left and went to his office and began feeling even worse with bilateral hand numbness, arm heaviness, and episodic chest pains lasting 10sec to up to a minute at at time. This was not worse with inspiration, exertion, or position changes. He did notice some component with palpation. He also began to feel intermittently dizzy as well as somewhat disoriented, like he couldn't collect his thoughts. He had some associated SOB. No syncope, vomiting, or LEE. He drove  him (acknowledges that he shouldn't have driven while feeling strange) and his wife thought he looked gray so brought him to the ER. He was still feeling somewhat strange when he got here. BP 101/82, HR 67, pulse ox 99% RA. On telemetry he is noted to have sinus rhythm with intermittent bradycardia into the upper 30s/low 40s even during waking hours, as well as frequent PVCs and a 6 beat run of NSVT. Labs notable for neg troponin x4, BUN/Cr 39/1.25 (was 1.26 leaving the hospital recently), K  3.9, BNP 21.CXR no acute disease. He says he felt bad pretty much all day yesterday from 2pm on. When he woke up this morning he felt somewhat better. He continues to have chest pains on/off without particular trigger. Denies any tobacco, alcohol or illicit drug use.   Past Medical History  Diagnosis Date  . Diabetes mellitus (HCC)   . Obesity   . Non-ischemic cardiomyopathy (HCC)     a. Adm 07/2015 with CP - Echo 08/01/15 - EF of 30-35%, global hypokinesis, grade 2 DD. LHC 07/31/15: 35% mRCA, otherwise normal cors, LVEF 35-45% with severely elevated LVEDP - it was suspected his CP was related to his CHF.  . Bradycardia   . HLD (hyperlipidemia)   . Snoring   . Chronic combined systolic and diastolic CHF (congestive heart failure) (HCC)   . NSVT (nonsustained ventricular tachycardia) (HCC)   . Suspected sleep apnea       Surgical History:  Past Surgical History  Procedure Laterality Date  . Appendectomy    . Cardiac catheterization N/A 07/31/2015  Procedure: Left Heart Cath and Coronary Angiography;  Surgeon: Marykay Lex, MD;  Location: [facility] INVASIVE CV LAB;  Service: Cardiovascular;  Laterality: N/A;     Home Meds: Prior to Admission medications   Medication Sig Start Date End Date Taking? Authorizing Provider  aspirin EC 81 MG EC tablet Take 1 tablet (81 mg total) by mouth daily. 08/03/15  Yes Bhavinkumar Bhagat, PA  atorvastatin (LIPITOR) 40 MG tablet Take 1 tablet (40 mg total) by mouth daily at 6  PM. 08/03/15  Yes Bhavinkumar Bhagat, PA  carvedilol (COREG) 3.125 MG tablet Take 0.5 tablets (1.56 mg total) by mouth 2 (two) times daily with a meal. 08/03/15  Yes Bhavinkumar Bhagat, PA  fenofibrate 160 MG tablet Take 1 tablet (160 mg total) by mouth daily. 08/03/15  Yes Bhavinkumar Bhagat, PA  hydrochlorothiazide (HYDRODIURIL) 25 MG tablet Take 1 tablet (25 mg total) by mouth daily. 08/07/15  Yes Rolland Porter, MD  lisinopril (PRINIVIL,ZESTRIL) 10 MG tablet Take 1 tablet (10 mg total) by mouth daily. 08/03/15  Yes Bhavinkumar Bhagat, PA  metFORMIN (GLUCOPHAGE) 1000 MG tablet Take 1 tablet (1,000 mg total) by mouth daily after supper. 08/03/15  Yes Bhavinkumar Bhagat, PA  omega-3 acid ethyl esters (LOVAZA) 1 g capsule Take 2 capsules (2 g total) by mouth 2 (two) times daily. 08/03/15  Yes Bhavinkumar Bhagat, PA  furosemide (LASIX) 40 MG tablet Take 1 tab by mouth as needed for shortness of breath or edema in legs, feet or ankles 08/16/15   Azalee Course, PA  nitroGLYCERIN (NITROSTAT) 0.4 MG SL tablet Place 0.4 mg under the tongue every 5 (five) minutes as needed for chest pain (3 DOSES MAX).    Historical Provider, MD    Inpatient Medications:  . amLODipine  5 mg Oral Daily  . aspirin EC  81 mg Oral Daily  . atorvastatin  40 mg Oral q1800  . carvedilol  1.56 mg Oral BID WC  . fenofibrate  160 mg Oral Daily  . heparin  5,000 Units Subcutaneous Q8H  . insulin aspart  0-5 Units Subcutaneous QHS  . insulin aspart  0-9 Units Subcutaneous TID WC  . omega-3 acid ethyl esters  2 g Oral BID      Allergies: No Known Allergies  Social History   Social History  . Marital Status: Married    Spouse Name: N/A  . Number of Children: N/A  . Years of Education: N/A   Occupational History  . Pastor    Social History Main Topics  . Smoking status: Former Games developer  . Smokeless tobacco: Never Used     Comment: QUIT SMOKING AT AGE 84  . Alcohol Use: No  . Drug Use: No  . Sexual Activity: Not on file   Other  Topics Concern  . Not on file   Social History Narrative     Family History  Problem Relation Age of Onset  . Heart attack Father 17    Died early 16's   . Coronary artery disease Brother 63    STENTS  . Angina Sister 2    Chronic     Review of Systems: All other systems reviewed and are otherwise negative except as noted above.  Labs:  Recent Labs  08/17/15 0202 08/17/15 0648 08/17/15 1153  TROPONINI <0.03 <0.03 <0.03   Lab Results  Component Value Date   WBC 9.9 08/16/2015   HGB 13.5 08/16/2015   HCT 39.6 08/16/2015   MCV 88.6 08/16/2015   PLT 263 08/16/2015  Recent Labs Lab 08/16/15 1806  NA 133*  K 3.9  CL 100*  CO2 25  BUN 39*  CREATININE 1.25*  CALCIUM 9.5  GLUCOSE 179*   Lab Results  Component Value Date   CHOL 104 08/17/2015   HDL 35* 08/17/2015   LDLCALC 49 08/17/2015   TRIG 100 08/17/2015   Lab Results  Component Value Date   DDIMER <0.27 07/30/2015    Radiology/Studies:  Dg Chest 2 View  08/16/2015  CLINICAL DATA:  Dizziness and shortness of breath with chest tightness for 4 hours. EXAM: CHEST  2 VIEW COMPARISON:  08/07/2015 FINDINGS: The heart size and mediastinal contours are within normal limits. Both lungs are clear. The visualized skeletal structures are unremarkable. IMPRESSION: No active cardiopulmonary disease. Electronically Signed   By: Kennith Center M.D.   On: 08/16/2015 19:11   Dg Chest 2 View  08/07/2015  CLINICAL DATA:  Acute onset of generalized chest pain. Initial encounter. EXAM: CHEST  2 VIEW COMPARISON:  Chest radiograph performed 07/30/2015 FINDINGS: The lungs are well-aerated and clear. There is no evidence of focal opacification, pleural effusion or pneumothorax. The heart is normal in size; the mediastinal contour is within normal limits. No acute osseous abnormalities are seen. IMPRESSION: No acute cardiopulmonary process seen. Electronically Signed   By: Roanna Raider M.D.   On: 08/07/2015 01:49   Dg Chest 2  View  07/30/2015  CLINICAL DATA:  Left-sided chest pain this morning. Shortness of breath. Former smoker. EXAM: CHEST  2 VIEW COMPARISON:  None. FINDINGS: The cardiomediastinal silhouette is within normal limits. The lungs are mildly hypoinflated. There is mild central airway thickening. No segmental airspace consolidation, overt pulmonary edema, pleural effusion, or pneumothorax is identified. No acute osseous abnormality is seen. IMPRESSION: Mild bronchitic changes. Electronically Signed   By: Sebastian Ache M.D.   On: 07/30/2015 10:23   [name] Contrast  08/17/2015  CLINICAL DATA:  Left hand numbness and weakness. EXAM: MRI HEAD WITHOUT CONTRAST TECHNIQUE: Multiplanar, multiecho pulse sequences of the brain and surrounding structures were obtained without intravenous contrast. COMPARISON:  None. FINDINGS: Brain Parenchyma: No acute infarct or intraparenchymal hemorrhage. No focal parenchymal signal abnormality. No mass lesion or midline shift. The major intracranial flow voids are preserved. The midline structures are normal. Ventricles, Sulci and Extra-axial Spaces: Normal for age. No extra-axial collection. Paranasal Sinuses and Mastoids: No fluid levels or advanced mucosal thickening. Orbits: Normal. Bones and Soft Tissues: The visualized skull base, calvarium and extracranial soft tissues are normal. IMPRESSION: Normal brain MRI for age. No findings to explain reported left hand weakness. Electronically Signed   By: Deatra Robinson M.D.   On: 08/17/2015 14:06    Wt Readings from Last 3 Encounters:  08/17/15 309 lb 12.8 oz (140.524 kg)  08/16/15 325 lb 3.2 oz (147.51 kg)  08/07/15 327 lb (148.326 kg)    EKG: multiple tracings reviwed: NSR with occasional PVCs, nonspecific ST-T changes (EKG from 7/12 shows TWI inferiorly and V5-V6 which was not seen on other EKGs)  Physical Exam: Blood pressure 109/57, pulse 52, temperature 97.6 F (36.4 C), temperature source Oral, resp. rate 18, height 6\' 2"   (1.88 m), weight 309 lb 12.8 oz (140.524 kg), SpO2 98 %. Body mass index is 39.76 kg/(m^2). General: Well developed, well nourished obese WM in no acute distress. Head: Normocephalic, atraumatic, sclera non-icteric, no xanthomas, nares are without discharge.  Neck: Negative for carotid bruits. JVD not elevated. Lungs: Clear bilaterally to auscultation without wheezes, rales, or  rhonchi. Breathing is unlabored. Heart: RRR distant heart sounds with S1 S2. No murmurs, rubs, or gallops appreciated. Abdomen: Soft, non-tender, non-distended with normoactive bowel sounds. No hepatomegaly. No rebound/guarding. No obvious abdominal masses. Msk:  Strength and tone appear normal for age. Extremities: No clubbing or cyanosis. No edema.  Distal pedal pulses are 2+ and equal bilaterally. Neuro: Alert and oriented X 3. No facial asymmetry. No focal deficit. Moves all extremities spontaneously. Psych:  Responds to questions appropriately with a normal affect.     Assessment and Plan   1. Chest pain, weakness, disorientation 2. NICM/recently diagnosed chronic combined CHF 3. Sinus bradycardia (HR upper 30s-low 40s at times) 4. Frequent PVCs/NSVT 5. Acute kidney injury  Symptom complex may be related to arrhythmia - it is difficult to know given duration of symptoms. MRI brain is negative. Chest pain is somewhat atypical and recent cath showed no obstructive coronary disease. He has fortunately ruled out this admission. Telemetry, however, is overall concerning to me given his bradycardia as well as frequent ventricular ectopy. Question whether PVCs or NSVT are contributing to his symptoms or perhaps even the underlying etiology of his cardiomyopathy. Hold further carvedilol. I do not think he needs another echo since he just had one about 2 weeks ago - have canceled order. Although I doubt use, will send UDS for completeness. Will review further recommendations with Dr. Royann Shivers.  Signed, Laurann Montana  PA-C 08/17/2015, 2:56 PM Pager: [phone]  I have seen and examined the patient along with Dayna N Dunn PA-C.  I have reviewed the chart, notes and new data.  I agree with PA's note.  Key new complaints: extreme fatigue and near syncope (likely explained by severe bradycardia); also describes orthopnea (sleeping in recliner rather than bed); twinges of chest pain ar invariably brief and not associated with exertion. Wife reports frequent witnessed apnea during his sleep, longstanding loud snoring. Key examination changes: obesity limits the exam, very frequent ectopy. Key new findings / data: extremely frequent monomorphic PVCs (RBBB morphology, superior axis), intercalated between sinus beats, seem to be>25-30% of beats, often in bigeminy, one 6-beat run NSVT.   PLAN: Have to stop beta blocker due to symptomatic bradycardia. Clearly needs evaluation and treatment for sleep apnea. His bradycardia is pervasive, even when wide awake and even after stopping beta blocker. Intercalated PVCs are likely worsening the reduced cardiac output. 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 Unfortunately, bradycardia limits the use of beta blockers for CHF and for the PVCs. Consider antiarrhythmics or EPS/RF ablation. Difficult to decide on appropriate device therapy so soon after the diagnosis of cardiomyopathy. I think EP consultation is warranted.  Thurmon Fair, MD, [facility] [phone] 08/17/2015, 3:59 PM

## 2015-08-17 NOTE — Progress Notes (Signed)
Nutrition Brief Note  Patient identified on the Malnutrition Screening Tool (MST) Report  Wt Readings from Last 15 Encounters:  08/17/15 309 lb 12.8 oz (140.524 kg)  08/16/15 325 lb 3.2 oz (147.51 kg)  08/07/15 327 lb (148.326 kg)  08/03/15 328 lb 14.4 oz (149.188 kg)  07/27/11 330 lb (149.687 kg)    Body mass index is 39.76 kg/(m^2). Patient meets criteria for Obesity based on current BMI. Pt reports changing his diet since previous admission (no added salt) and making an effort to lose weight by eating healthier. He reports having a normal appetite and eating well PTA. Pt denies any nutrition questions or concerns at this time.   Current diet order is NPO. Labs and medications reviewed.   No nutrition interventions warranted at this time. If nutrition issues arise, please consult RD.   Matthew Sexton RD, LDN Inpatient Clinical Dietitian Pager: [phone] After Hours Pager: [phone]

## 2015-08-18 DIAGNOSIS — R208 Other disturbances of skin sensation: Secondary | ICD-10-CM

## 2015-08-18 DIAGNOSIS — R0789 Other chest pain: Secondary | ICD-10-CM

## 2015-08-18 LAB — CBC
HEMATOCRIT: 39.6 % (ref 39.0–52.0)
Hemoglobin: 13 g/dL (ref 13.0–17.0)
MCH: 29.3 pg (ref 26.0–34.0)
MCHC: 32.8 g/dL (ref 30.0–36.0)
MCV: 89.2 fL (ref 78.0–100.0)
Platelets: 244 10*3/uL (ref 150–400)
RBC: 4.44 MIL/uL (ref 4.22–5.81)
RDW: 11.8 % (ref 11.5–15.5)
WBC: 9.2 10*3/uL (ref 4.0–10.5)

## 2015-08-18 LAB — BASIC METABOLIC PANEL
Anion gap: 9 (ref 5–15)
BUN: 26 mg/dL — AB (ref 6–20)
CHLORIDE: 99 mmol/L — AB (ref 101–111)
CO2: 27 mmol/L (ref 22–32)
Calcium: 9.3 mg/dL (ref 8.9–10.3)
Creatinine, Ser: 1.09 mg/dL (ref 0.61–1.24)
GFR calc Af Amer: >60 mL/min (ref >60)
GFR calc non Af Amer: >60 mL/min (ref >60)
Glucose, Bld: 178 mg/dL — ABNORMAL HIGH (ref 65–99)
POTASSIUM: 3.7 mmol/L (ref 3.5–5.1)
SODIUM: 135 mmol/L (ref 135–145)

## 2015-08-18 LAB — GLUCOSE, CAPILLARY
GLUCOSE-CAPILLARY: 226 mg/dL — AB (ref 65–99)
GLUCOSE-CAPILLARY: 210 mg/dL — AB (ref 65–99)
Glucose-Capillary: 173 mg/dL — ABNORMAL HIGH (ref 65–99)
Glucose-Capillary: 197 mg/dL — ABNORMAL HIGH (ref 65–99)

## 2015-08-18 LAB — MAGNESIUM: Magnesium: 2.6 mg/dL — ABNORMAL HIGH (ref 1.7–2.4)

## 2015-08-18 NOTE — Progress Notes (Signed)
Patient Name: Matthew Sexton Date of Encounter: 08/18/2015  Principal Problem:   Chest pain Active Problems:   Morbid obesity (HCC)   Mixed dyslipidemia   DM type 2 with diabetic dyslipidemia (HCC)   AKI (acute kidney injury) (HCC)   NICM (nonischemic cardiomyopathy) (HCC)   Chronic combined systolic (congestive) and diastolic (congestive) heart failure (HCC)   Numbness of left hand   Frequent PVCs   NSVT (nonsustained ventricular tachycardia) (HCC)   Length of Stay:   SUBJECTIVE  Feels better - walked to solarium and back, without presyncope or excessive fatigue. Remarkable increase in background sinus rate from 40s to 60s. Maybe some reduction in overall PVC burden, but continues to have frequent monomorphic PVCs.  CURRENT MEDS . amiodarone  400 mg Oral BID  . amLODipine  5 mg Oral Daily  . aspirin EC  81 mg Oral Daily  . atorvastatin  40 mg Oral q1800  . fenofibrate  160 mg Oral Daily  . heparin  5,000 Units Subcutaneous Q8H  . insulin aspart  0-5 Units Subcutaneous QHS  . insulin aspart  0-9 Units Subcutaneous TID WC  . omega-3 acid ethyl esters  2 g Oral BID    OBJECTIVE   Intake/Output Summary (Last 24 hours) at 08/18/15 1246 Last data filed at 08/18/15 1610  Gross per 24 hour  Intake    360 ml  Output   1150 ml  Net   -790 ml   Filed Weights   08/17/15 0116 08/18/15 0359  Weight: 140.524 kg (309 lb 12.8 oz) 145.06 kg (319 lb 12.8 oz)    PHYSICAL EXAM Filed Vitals:   08/18/15 0001 08/18/15 0359 08/18/15 0933 08/18/15 1121  BP: 114/51 115/74 122/64 114/71  Pulse: 51 51  57  Temp: 97.6 F (36.4 C) 97.9 F (36.6 C)  97.5 F (36.4 C)  TempSrc: Oral Oral  Oral  Resp: Height:      Weight:  145.06 kg (319 lb 12.8 oz)    SpO2: 97% 96%  98%   General: Alert, oriented x3, no distress Head: no evidence of trauma, PERRL, EOMI, no exophtalmos or lid lag, no myxedema, no xanthelasma; normal ears, nose and oropharynx Neck: normal jugular venous  pulsations and no hepatojugular reflux; brisk carotid pulses without delay and no carotid bruits Chest: clear to auscultation, no signs of consolidation by percussion or palpation, normal fremitus, symmetrical and full respiratory excursions Cardiovascular: normal position and quality of the apical impulse, regular rhythm, normal first and second heart sounds, no rubs or gallops, no murmur Abdomen: no tenderness or distention, no masses by palpation, no abnormal pulsatility or arterial bruits, normal bowel sounds, no hepatosplenomegaly Extremities: no clubbing, cyanosis or edema; 2+ radial, ulnar and brachial pulses bilaterally; 2+ right femoral, posterior tibial and dorsalis pedis pulses; 2+ left femoral, posterior tibial and dorsalis pedis pulses; no subclavian or femoral bruits Neurological: grossly nonfocal  LABS  CBC  Recent Labs  08/16/15 1806 08/18/15 0310  WBC 9.9 9.2  HGB 13.5 13.0  HCT 39.6 39.6  MCV 88.6 89.2  PLT 263 244   Basic Metabolic Panel  Recent Labs  08/16/15 1806 08/18/15 0310  NA 133* 135  K 3.9 3.7  CL 100* 99*  CO2 25 27  GLUCOSE 179* 178*  BUN 39* 26*  CREATININE 1.25* 1.09  CALCIUM 9.5 9.3  MG  --  2.6*   Liver Function Tests No results for input(s): AST, ALT, ALKPHOS, BILITOT, PROT, ALBUMIN in the  last 72 hours. No results for input(s): LIPASE, AMYLASE in the last 72 hours. Cardiac Enzymes  Recent Labs  08/17/15 0202 08/17/15 0648 08/17/15 1153  TROPONINI <0.03 <0.03 <0.03   BNP Invalid input(s): POCBNP D-Dimer No results for input(s): DDIMER in the last 72 hours. Hemoglobin A1C  Recent Labs  08/17/15 0202  HGBA1C 8.0*   Fasting Lipid Panel  Recent Labs  08/17/15 0202  CHOL 104  HDL 35*  LDLCALC 49  TRIG 789  CHOLHDL 3.0   Thyroid Function Tests No results for input(s): TSH, T4TOTAL, T3FREE, THYROIDAB in the last 72 hours.  Invalid input(s): FREET3  Radiology Studies Imaging results have been reviewed and Dg Chest  2 View  08/16/2015  CLINICAL DATA:  Dizziness and shortness of breath with chest tightness for 4 hours. EXAM: CHEST  2 VIEW COMPARISON:  08/07/2015 FINDINGS: The heart size and mediastinal contours are within normal limits. Both lungs are clear. The visualized skeletal structures are unremarkable. IMPRESSION: No active cardiopulmonary disease. Electronically Signed   By: Kennith Center M.D.   On: 08/16/2015 19:11   [name] Contrast  08/17/2015  CLINICAL DATA:  Left hand numbness and weakness. EXAM: MRI HEAD WITHOUT CONTRAST TECHNIQUE: Multiplanar, multiecho pulse sequences of the brain and surrounding structures were obtained without intravenous contrast. COMPARISON:  None. FINDINGS: Brain Parenchyma: No acute infarct or intraparenchymal hemorrhage. No focal parenchymal signal abnormality. No mass lesion or midline shift. The major intracranial flow voids are preserved. The midline structures are normal. Ventricles, Sulci and Extra-axial Spaces: Normal for age. No extra-axial collection. Paranasal Sinuses and Mastoids: No fluid levels or advanced mucosal thickening. Orbits: Normal. Bones and Soft Tissues: The visualized skull base, calvarium and extracranial soft tissues are normal. IMPRESSION: Normal brain MRI for age. No findings to explain reported left hand weakness. Electronically Signed   By: Deatra Robinson M.D.   On: 08/17/2015 14:06    TELE SR, frequent monomorphic PVCs (RBBB, right superior axis pattern)    ASSESSMENT AND PLAN  He appears to be exquisitely sensitive to beta blockers and did not tolerate even a minute dose of carvedilol. It's too soon to tell if amiodarone will work and be well tolerated. If he feels well and has not developed bradycardia, probably DC tomorrow with planned outpatient sleep study and f/u with Dr. Elberta Fortis.   Thurmon Fair, MD, [facility] [phone] office [phone] pager 08/18/2015 12:46 PM

## 2015-08-18 NOTE — Progress Notes (Signed)
Pt complaining of cramps in side and dizziness while walking. VSS. Pt felt better once he sat down. Paged MD to make him aware. Will continue to monitor.

## 2015-08-18 NOTE — Progress Notes (Signed)
PROGRESS NOTE  Matthew Sexton ZOX:[id number] DOB: 1970/05/18 DOA: 08/16/2015 PCP: [name], MD  HPI/Recap of past 24 hours: 45 y.o. 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  This AM is feeling better, intermittent discomfort in chest but ambulated this AM without presyncope.  Assessment/Plan: Chest pain: he has atypical chest pain, etiology is not clear. Patient had cardiac cath on 07/31/15, which showed Mid RCA lesion, 35% stenosed. Currently patient does not have shortness breath or oxygen desaturation, no signs of DVT, less likely to have PE. Chest x-ray is negative for infiltration. Will work up for chest pain rule out. - he is on Tele bed for obs - Nitroglycerin, Morphine, and aspirin, lipitor - continue home low dose of coreg, 1.56 mg bid (pt has bradycardia on admission, holding parameter was placed for HR<60) - Risk factor stratification: will check FLP and A1C  - 2d echo cancelled - cardiology has been consulted, was also seen by EP and Amiodarone started  Left hand numbness and weakness: Etiology is not clear. Will need to r/o stroke. -MRI-brain w/o acute findings -on ASA  DM-II: Last A1c 8.8 on 07/30/15, poorly controled. Patient is taking metformin at home -SSI -Check A1c  Mixed dyslipidemia: -Continue home medications: Lipitor and fenofibrate -FLP  AKI (acute kidney injury): Cre 1.25 and BUN 39. Likely due to prerenal secondary to dehydration and continuation of ACEI and diruetics - Check FeUrea - Follow up renal function by BMP - Hold lisinopril, HCTZ and lasix   Chronic combined systolic and diastolic CHF: 2-D echo on 08/01/15 showed EF 30-35% with grade 2 diastolic dysfunction. Pt has trace leg edema, no JVD. CHF is compensated. -Hold HCTZ, lasix and lisinoprildue to  AKI-->start Amlodipine with holding parameter -Check BNP -Continue aspirin and Coreg  DVT ppx: SQ Heparin  Code Status: Full code Family Communication: Yes, patient's wife at bed side Disposition Plan: Anticipate discharge back to previous home environment, likely DC home in AM if continues to do well on Amiodarone per cardiology. Consults called: cardiology Admission status: Obs / tele    Objective: Filed Vitals:   08/18/15 0001 08/18/15 0359 08/18/15 0933 08/18/15 1121  BP: 114/51 115/74 122/64 114/71  Pulse: 51 51  57  Temp: 97.6 F (36.4 C) 97.9 F (36.6 C)  97.5 F (36.4 C)  TempSrc: Oral Oral  Oral  Resp: 20 20  20   Height:      Weight:  145.06 kg (319 lb 12.8 oz)    SpO2: 97% 96%  98%    Intake/Output Summary (Last 24 hours) at 08/18/15 1202 Last data filed at 08/18/15 8119  Gross per 24 hour  Intake    360 ml  Output   1150 ml  Net   -790 ml   Filed Weights   08/17/15 0116 08/18/15 0359  Weight: 140.524 kg (309 lb 12.8 oz) 145.06 kg (319 lb 12.8 oz)    Exam: General:  Alert, oriented, calm, in no acute distress, worried appearing Eyes: pupils round and reactive to light and accomodation, clear sclerea Neck: supple, no masses, trachea mildline  Cardiovascular: RRR, no murmurs or rubs, no peripheral edema  Respiratory: clear to auscultation bilaterally, no wheezes, no crackles  Abdomen: soft, nontender, nondistended, normal bowel tones heard  Skin: dry, no rashes  Musculoskeletal: no joint effusions, normal range of motion  Psychiatric: appropriate affect,  normal speech  Neurologic: extraocular muscles intact, clear speech, moving all extremities with intact sensorium    Data Reviewed: CBC:  Recent Labs Lab 08/16/15 1806 08/18/15 0310  WBC 9.9 9.2  HGB 13.5 13.0  HCT 39.6 39.6  MCV 88.6 89.2  PLT 263 244   Basic Metabolic Panel:  Recent Labs Lab 08/16/15 0949 08/16/15 1806 08/18/15 0310  NA 135 133* 135  K 4.6 3.9 3.7  CL 102 100*  99*  CO2 25 25 27   GLUCOSE 255* 179* 178*  BUN 37* 39* 26*  CREATININE 1.24 1.25* 1.09  CALCIUM 9.7 9.5 9.3  MG  --   --  2.6*   GFR: Estimated Creatinine Clearance: 127.3 mL/min (by C-G formula based on Cr of 1.09). Liver Function Tests: No results for input(s): AST, ALT, ALKPHOS, BILITOT, PROT, ALBUMIN in the last 168 hours. No results for input(s): LIPASE, AMYLASE in the last 168 hours. No results for input(s): AMMONIA in the last 168 hours. Coagulation Profile: No results for input(s): INR, PROTIME in the last 168 hours. Cardiac Enzymes:  Recent Labs Lab 08/17/15 0202 08/17/15 0648 08/17/15 1153  TROPONINI <0.03 <0.03 <0.03   BNP (last 3 results) No results for input(s): PROBNP in the last 8760 hours. HbA1C:  Recent Labs  08/17/15 0202  HGBA1C 8.0*   CBG:  Recent Labs Lab 08/17/15 1115 08/17/15 1635 08/17/15 2117 08/18/15 0620 08/18/15 1115  GLUCAP 171* 143* 270* 173* 226*   Lipid Profile:  Recent Labs  08/17/15 0202  CHOL 104  HDL 35*  LDLCALC 49  TRIG 817  CHOLHDL 3.0   Thyroid Function Tests: No results for input(s): TSH, T4TOTAL, FREET4, T3FREE, THYROIDAB in the last 72 hours. Anemia Panel: No results for input(s): VITAMINB12, FOLATE, FERRITIN, TIBC, IRON, RETICCTPCT in the last 72 hours. Urine analysis:    Component Value Date/Time   COLORURINE YELLOW 07/27/2011 1155   APPEARANCEUR CLEAR 07/27/2011 1155   LABSPEC 1.025 07/27/2011 1155   PHURINE 5.5 07/27/2011 1155   GLUCOSEU NEGATIVE 07/27/2011 1155   HGBUR NEGATIVE 07/27/2011 1155   BILIRUBINUR NEGATIVE 07/27/2011 1155   KETONESUR NEGATIVE 07/27/2011 1155   PROTEINUR NEGATIVE 07/27/2011 1155   UROBILINOGEN 0.2 07/27/2011 1155   NITRITE NEGATIVE 07/27/2011 1155   LEUKOCYTESUR NEGATIVE 07/27/2011 1155   Sepsis Labs: @LABRCNTIP (procalcitonin:4,lacticidven:4)  )No results found for this or any previous visit (from the past 240 hour(s)).    Studies: [name]  Contrast  08/17/2015  CLINICAL DATA:  Left hand numbness and weakness. EXAM: MRI HEAD WITHOUT CONTRAST TECHNIQUE: Multiplanar, multiecho pulse sequences of the brain and surrounding structures were obtained without intravenous contrast. COMPARISON:  None. FINDINGS: Brain Parenchyma: No acute infarct or intraparenchymal hemorrhage. No focal parenchymal signal abnormality. No mass lesion or midline shift. The major intracranial flow voids are preserved. The midline structures are normal. Ventricles, Sulci and Extra-axial Spaces: Normal for age. No extra-axial collection. Paranasal Sinuses and Mastoids: No fluid levels or advanced mucosal thickening. Orbits: Normal. Bones and Soft Tissues: The visualized skull base, calvarium and extracranial soft tissues are normal. IMPRESSION: Normal brain MRI for age. No findings to explain reported left hand weakness. Electronically Signed   By: Deatra Robinson M.D.   On: 08/17/2015 14:06    Scheduled Meds: . amiodarone  400 mg Oral BID  . amLODipine  5 mg Oral Daily  . aspirin EC  81 mg Oral Daily  . atorvastatin  40 mg Oral q1800  . fenofibrate  160 mg Oral Daily  .  heparin  5,000 Units Subcutaneous Q8H  . insulin aspart  0-5 Units Subcutaneous QHS  . insulin aspart  0-9 Units Subcutaneous TID WC  . omega-3 acid ethyl esters  2 g Oral BID    Continuous Infusions:      Time spent: 24 minutes  Mir Vergie Living, MD [facility] Pager [phone]  If 7PM-7AM, please contact night-coverage [email or web address] Password [facility] 08/18/2015, 12:02 PM

## 2015-08-18 NOTE — Consult Note (Signed)
SUBJECTIVE: The patient is doing well today.  At this time, he denies chest pain, shortness of breath, or any new concerns.  Feeling improved from yesterday.  Had amiodarone started last night with apparent decrease in PVCs.    Marland Kitchen amiodarone  400 mg Oral BID  . amLODipine  5 mg Oral Daily  . aspirin EC  81 mg Oral Daily  . atorvastatin  40 mg Oral q1800  . fenofibrate  160 mg Oral Daily  . heparin  5,000 Units Subcutaneous Q8H  . insulin aspart  0-5 Units Subcutaneous QHS  . insulin aspart  0-9 Units Subcutaneous TID WC  . omega-3 acid ethyl esters  2 g Oral BID      OBJECTIVE: Physical Exam: Filed Vitals:   08/17/15 2018 08/18/15 0001 08/18/15 0359 08/18/15 0933  BP: 129/63 114/51 115/74 122/64  Pulse: 55 51 51   Temp: 98 F (36.7 C) 97.6 F (36.4 C) 97.9 F (36.6 C)   TempSrc: Oral Oral Oral   Resp: 20 20 20    Height:      Weight:   319 lb 12.8 oz (145.06 kg)   SpO2: 95% 97% 96%     Intake/Output Summary (Last 24 hours) at 08/18/15 1023 Last data filed at 08/18/15 6144  Gross per 24 hour  Intake    360 ml  Output   1150 ml  Net   -790 ml    Telemetry reveals sinus rhythm with PVCs  GEN- The patient is well appearing, alert and oriented x 3 today.   Head- normocephalic, atraumatic Eyes-  Sclera clear, conjunctiva pink Ears- hearing intact Oropharynx- clear Neck- supple, no JVP Lymph- no cervical lymphadenopathy Lungs- Clear to ausculation bilaterally, normal work of breathing Heart- Regular rate and rhythm, no murmurs, rubs or gallops, PMI not laterally displaced GI- soft, NT, ND, + BS Extremities- no clubbing, cyanosis, or edema Skin- no rash or lesion Psych- euthymic mood, full affect Neuro- strength and sensation are intact  LABS: Basic Metabolic Panel:  Recent Labs  31/54/00 1806 08/18/15 0310  NA 133* 135  K 3.9 3.7  CL 100* 99*  CO2 25 27  GLUCOSE 179* 178*  BUN 39* 26*  CREATININE 1.25* 1.09  CALCIUM 9.5 9.3  MG  --  2.6*   Liver  Function Tests: No results for input(s): AST, ALT, ALKPHOS, BILITOT, PROT, ALBUMIN in the last 72 hours. No results for input(s): LIPASE, AMYLASE in the last 72 hours. CBC:  Recent Labs  08/16/15 1806 08/18/15 0310  WBC 9.9 9.2  HGB 13.5 13.0  HCT 39.6 39.6  MCV 88.6 89.2  PLT 263 244   Cardiac Enzymes:  Recent Labs  08/17/15 0202 08/17/15 0648 08/17/15 1153  TROPONINI <0.03 <0.03 <0.03   BNP: Invalid input(s): POCBNP D-Dimer: No results for input(s): DDIMER in the last 72 hours. Hemoglobin A1C:  Recent Labs  08/17/15 0202  HGBA1C 8.0*   Fasting Lipid Panel:  Recent Labs  08/17/15 0202  CHOL 104  HDL 35*  LDLCALC 49  TRIG 867  CHOLHDL 3.0   Thyroid Function Tests: No results for input(s): TSH, T4TOTAL, T3FREE, THYROIDAB in the last 72 hours.  Invalid input(s): FREET3 Anemia Panel: No results for input(s): VITAMINB12, FOLATE, FERRITIN, TIBC, IRON, RETICCTPCT in the last 72 hours.  RADIOLOGY: Dg Chest 2 View  08/16/2015  CLINICAL DATA:  Dizziness and shortness of breath with chest tightness for 4 hours. EXAM: CHEST  2 VIEW COMPARISON:  08/07/2015 FINDINGS: The heart size and  mediastinal contours are within normal limits. Both lungs are clear. The visualized skeletal structures are unremarkable. IMPRESSION: No active cardiopulmonary disease. Electronically Signed   By: Kennith Center M.D.   On: 08/16/2015 19:11   Dg Chest 2 View  08/07/2015  CLINICAL DATA:  Acute onset of generalized chest pain. Initial encounter. EXAM: CHEST  2 VIEW COMPARISON:  Chest radiograph performed 07/30/2015 FINDINGS: The lungs are well-aerated and clear. There is no evidence of focal opacification, pleural effusion or pneumothorax. The heart is normal in size; the mediastinal contour is within normal limits. No acute osseous abnormalities are seen. IMPRESSION: No acute cardiopulmonary process seen. Electronically Signed   By: Roanna Raider M.D.   On: 08/07/2015 01:49   Dg Chest 2  View  07/30/2015  CLINICAL DATA:  Left-sided chest pain this morning. Shortness of breath. Former smoker. EXAM: CHEST  2 VIEW COMPARISON:  None. FINDINGS: The cardiomediastinal silhouette is within normal limits. The lungs are mildly hypoinflated. There is mild central airway thickening. No segmental airspace consolidation, overt pulmonary edema, pleural effusion, or pneumothorax is identified. No acute osseous abnormality is seen. IMPRESSION: Mild bronchitic changes. Electronically Signed   By: Sebastian Ache M.D.   On: 07/30/2015 10:23   [name] Contrast  08/17/2015  CLINICAL DATA:  Left hand numbness and weakness. EXAM: MRI HEAD WITHOUT CONTRAST TECHNIQUE: Multiplanar, multiecho pulse sequences of the brain and surrounding structures were obtained without intravenous contrast. COMPARISON:  None. FINDINGS: Brain Parenchyma: No acute infarct or intraparenchymal hemorrhage. No focal parenchymal signal abnormality. No mass lesion or midline shift. The major intracranial flow voids are preserved. The midline structures are normal. Ventricles, Sulci and Extra-axial Spaces: Normal for age. No extra-axial collection. Paranasal Sinuses and Mastoids: No fluid levels or advanced mucosal thickening. Orbits: Normal. Bones and Soft Tissues: The visualized skull base, calvarium and extracranial soft tissues are normal. IMPRESSION: Normal brain MRI for age. No findings to explain reported left hand weakness. Electronically Signed   By: Deatra Robinson M.D.   On: 08/17/2015 14:06    ASSESSMENT AND PLAN:  Principal Problem:   Chest pain Active Problems:   Morbid obesity (HCC)   Mixed dyslipidemia   DM type 2 with diabetic dyslipidemia (HCC)   AKI (acute kidney injury) (HCC)   NICM (nonischemic cardiomyopathy) (HCC)   Chronic combined systolic (congestive) and diastolic (congestive) heart failure (HCC)   Numbness of left hand   Frequent PVCs   NSVT (nonsustained ventricular tachycardia) (HCC)  Was put on  amiodarone overnight last night.  Has felt improved with decrease in PVC burden.  Plan for continued amiodarone.  No evidence of bradycardia overnight, no pacemaker indicated.  Have told the patient that he should ambulate today. Would likely benefit from amiodarone as an outpatient at 400 mg BID for one week then 200 daily with follow up in EP clinic.  Ziomara Birenbaum Jorja Loa, MD 08/18/2015 10:23 AM

## 2015-08-19 DIAGNOSIS — R072 Precordial pain: Secondary | ICD-10-CM

## 2015-08-19 DIAGNOSIS — N179 Acute kidney failure, unspecified: Secondary | ICD-10-CM

## 2015-08-19 DIAGNOSIS — E782 Mixed hyperlipidemia: Secondary | ICD-10-CM

## 2015-08-19 LAB — BASIC METABOLIC PANEL
ANION GAP: 11 (ref 5–15)
BUN: 22 mg/dL — ABNORMAL HIGH (ref 6–20)
CALCIUM: 8.9 mg/dL (ref 8.9–10.3)
CO2: 24 mmol/L (ref 22–32)
Chloride: 104 mmol/L (ref 101–111)
Creatinine, Ser: 1.12 mg/dL (ref 0.61–1.24)
GLUCOSE: 195 mg/dL — AB (ref 65–99)
POTASSIUM: 4 mmol/L (ref 3.5–5.1)
Sodium: 139 mmol/L (ref 135–145)

## 2015-08-19 LAB — GLUCOSE, CAPILLARY
GLUCOSE-CAPILLARY: 200 mg/dL — AB (ref 65–99)
GLUCOSE-CAPILLARY: 197 mg/dL — AB (ref 65–99)
GLUCOSE-CAPILLARY: 160 mg/dL — AB (ref 65–99)
Glucose-Capillary: 258 mg/dL — ABNORMAL HIGH (ref 65–99)

## 2015-08-19 LAB — CBC
HEMATOCRIT: 39 % (ref 39.0–52.0)
HEMOGLOBIN: 12.8 g/dL — AB (ref 13.0–17.0)
MCH: 29.2 pg (ref 26.0–34.0)
MCHC: 32.8 g/dL (ref 30.0–36.0)
MCV: 89 fL (ref 78.0–100.0)
Platelets: 220 10*3/uL (ref 150–400)
RBC: 4.38 MIL/uL (ref 4.22–5.81)
RDW: 11.7 % (ref 11.5–15.5)
WBC: 8.3 10*3/uL (ref 4.0–10.5)

## 2015-08-19 MED ORDER — POLYETHYLENE GLYCOL 3350 17 G PO PACK
17.0000 g | PACK | Freq: Every day | ORAL | Status: DC | PRN
  Administered 2015-08-19: 17 g via ORAL
  Filled 2015-08-19: qty 1

## 2015-08-19 NOTE — Progress Notes (Signed)
Progress Note    Matthew Sexton  LYY:[id number] DOB: 07/24/1970  DOA: 08/16/2015 PCP: [name], MD    Brief Narrative:   Matthew Sexton is an 45 y.o. male with a PMH of combined systolic/diastolic CHF with EF 30-35 percent, obesity, CAD, hyperlipidemia, and diabetes who was admitted 08/16/15 with a chief complaint of chest pain and left hand numbness/weakness.  Assessment/Plan:   Principal Problem:   Chest pain In the setting of nonischemic cardiomyopathy and chronic systolic CHF/chronic diastolic CHF Status post recent cardiac catheterization 07/31/15 which showed mid RCA stenosis of 35%. Chest x-ray was negative for infiltrates. Troponins were negative. Continue coreg, lisinopril, statin and aspirin. Continue recently added HCTZ, consider changing to spironolactone in future for mortality benefit. Will need echo in 3 months. Follow-up with cardiologist as an outpatient.  Active Problems:   Morbid obesity (HCC), Ongoing Lifestyle changes encouraged including weight loss, low-fat/carbohydrate modified diet and regular exercise.Outpatient sleep study planned.    Mixed dyslipidemia, controlled Continue Lipitor, lovaza and fibrates. Will need lipid panel and LFT in 4-6 weeks. Cholesterol 104, LDL 49.    DM type 2 with diabetic dyslipidemia (HCC) Metformin on hold. Currently being managed with insulin sensitive SSI. CBGs 173-226. Hemoglobin A1c 8% indicating suboptimal outpatient control. Will need close follow-up with PCP to achieve better glycemic control.    AKI (acute kidney injury) (HCC), resolved Resolved.    Numbness of left hand, resolved MRI of the brain negative. Resolved.    Frequent PVCs/NSVT (nonsustained ventricular tachycardia) (HCC), ongoing  Mildly bradycardic last night with heart rate down to 39 and range 40-60 subsequently. Monitor on telemetry. Continue amiodarone for now.    Family Communication/Anticipated D/C date and plan/Code Status   DVT prophylaxis:  Subcutaneous heparin ordered. Code Status: Full Code.  Family Communication: Wife updated at bedside. Disposition Plan: Home when cleared by cardiology, possibly 24-48 hours.   Medical Consultants:    Cardiology   Procedures:   Dg Chest 2 View  08/16/2015  CLINICAL DATA:  Dizziness and shortness of breath with chest tightness for 4 hours. EXAM: CHEST  2 VIEW COMPARISON:  08/07/2015 FINDINGS: The heart size and mediastinal contours are within normal limits. Both lungs are clear. The visualized skeletal structures are unremarkable. IMPRESSION: No active cardiopulmonary disease. Electronically Signed   By: Kennith Center M.D.   On: 08/16/2015 19:11   Dg Chest 2 View  08/07/2015  CLINICAL DATA:  Acute onset of generalized chest pain. Initial encounter. EXAM: CHEST  2 VIEW COMPARISON:  Chest radiograph performed 07/30/2015 FINDINGS: The lungs are well-aerated and clear. There is no evidence of focal opacification, pleural effusion or pneumothorax. The heart is normal in size; the mediastinal contour is within normal limits. No acute osseous abnormalities are seen. IMPRESSION: No acute cardiopulmonary process seen. Electronically Signed   By: Roanna Raider M.D.   On: 08/07/2015 01:49   Dg Chest 2 View  07/30/2015  CLINICAL DATA:  Left-sided chest pain this morning. Shortness of breath. Former smoker. EXAM: CHEST  2 VIEW COMPARISON:  None. FINDINGS: The cardiomediastinal silhouette is within normal limits. The lungs are mildly hypoinflated. There is mild central airway thickening. No segmental airspace consolidation, overt pulmonary edema, pleural effusion, or pneumothorax is identified. No acute osseous abnormality is seen. IMPRESSION: Mild bronchitic changes. Electronically Signed   By: Sebastian Ache M.D.   On: 07/30/2015 10:23   [name] Contrast  08/17/2015  CLINICAL DATA:  Left hand numbness and weakness. EXAM: MRI HEAD  WITHOUT CONTRAST TECHNIQUE: Multiplanar, multiecho pulse sequences of the  brain and surrounding structures were obtained without intravenous contrast. COMPARISON:  None. FINDINGS: Brain Parenchyma: No acute infarct or intraparenchymal hemorrhage. No focal parenchymal signal abnormality. No mass lesion or midline shift. The major intracranial flow voids are preserved. The midline structures are normal. Ventricles, Sulci and Extra-axial Spaces: Normal for age. No extra-axial collection. Paranasal Sinuses and Mastoids: No fluid levels or advanced mucosal thickening. Orbits: Normal. Bones and Soft Tissues: The visualized skull base, calvarium and extracranial soft tissues are normal. IMPRESSION: Normal brain MRI for age. No findings to explain reported left hand weakness. Electronically Signed   By: Deatra Robinson M.D.   On: 08/17/2015 14:06    Anti-Infectives:   None.  Subjective:    Matthew Sexton denies dyspnea, and chest pain. Patient does get dizzy and short of breath when standing up/ambulating however. Was up through the night secondary to his telemetry alarms going off due to bradycardia and the nurses repeatedly checking on him to see if he was symptomatic.  Objective:    Filed Vitals:   08/18/15 1805 08/18/15 1900 08/19/15 0539 08/19/15 1010  BP: 133/66 119/63 112/61 121/56  Pulse: 59 52 49   Temp:  98 F (36.7 C) 97.5 F (36.4 C)   TempSrc:  Oral Oral   Resp:  18 20   Height:      Weight:   146.285 kg (322 lb 8 oz)   SpO2:  95% 98%     Intake/Output Summary (Last 24 hours) at 08/19/15 1021 Last data filed at 08/19/15 0858  Gross per 24 hour  Intake    480 ml  Output    750 ml  Net   -270 ml   Filed Weights   08/17/15 0116 08/18/15 0359 08/19/15 0539  Weight: 140.524 kg (309 lb 12.8 oz) 145.06 kg (319 lb 12.8 oz) 146.285 kg (322 lb 8 oz)    Exam: General exam: Appears calm and comfortable. Obese. Respiratory system: Clear to auscultation. Respiratory effort normal. Cardiovascular system: S1 & S2 heard, RRR. No JVD,  rubs, gallops or clicks. No  murmurs. Gastrointestinal system: Abdomen is nondistended, soft and nontender. No organomegaly or masses felt. Normal bowel sounds heard. Central nervous system: Alert and oriented. No focal neurological deficits. Extremities: No clubbing, edema, or cyanosis. Skin: No rashes, lesions or ulcers Psychiatry: Judgement and insight appear normal. Mood & affect appropriate.   Data Reviewed:   I have personally reviewed following labs and imaging studies:  Labs: Basic Metabolic Panel:  Recent Labs Lab 08/16/15 0949 08/16/15 1806 08/18/15 0310 08/19/15 0359  NA 135 133* 135 139  K 4.6 3.9 3.7 4.0  CL 102 100* 99* 104  CO2 25 25 27 24   GLUCOSE 255* 179* 178* 195*  BUN 37* 39* 26* 22*  CREATININE 1.24 1.25* 1.09 1.12  CALCIUM 9.7 9.5 9.3 8.9  MG  --   --  2.6*  --    GFR Estimated Creatinine Clearance: 124.3 mL/min (by C-G formula based on Cr of 1.12).  CBC:  Recent Labs Lab 08/16/15 1806 08/18/15 0310 08/19/15 0359  WBC 9.9 9.2 8.3  HGB 13.5 13.0 12.8*  HCT 39.6 39.6 39.0  MCV 88.6 89.2 89.0  PLT 263 244 220   Cardiac Enzymes:  Recent Labs Lab 08/17/15 0202 08/17/15 0648 08/17/15 1153  TROPONINI <0.03 <0.03 <0.03   CBG:  Recent Labs Lab 08/18/15 0620 08/18/15 1115 08/18/15 1632 08/18/15 2217 08/19/15 0612  GLUCAP 173* 226*  210* 197* 200*   Hgb A1c:  Recent Labs  08/17/15 0202  HGBA1C 8.0*   Lipid Profile:  Recent Labs  08/17/15 0202  CHOL 104  HDL 35*  LDLCALC 49  TRIG 161  CHOLHDL 3.0   Microbiology No results found for this or any previous visit (from the past 240 hour(s)).   Medications:   . amiodarone  400 mg Oral BID  . amLODipine  5 mg Oral Daily  . aspirin EC  81 mg Oral Daily  . atorvastatin  40 mg Oral q1800  . fenofibrate  160 mg Oral Daily  . heparin  5,000 Units Subcutaneous Q8H  . insulin aspart  0-5 Units Subcutaneous QHS  . insulin aspart  0-9 Units Subcutaneous TID WC  . omega-3 acid ethyl esters  2 g Oral BID    Continuous Infusions:   Time spent: 25 minutes.     Zailee Vallely  [facility] Pager [phone]. If unable to reach me by pager, please call my cell phone at [phone].  *Please refer to [email or web address], password [email or web address] to get updated schedule on who will round on this patient, as hospitalists switch teams weekly. If 7PM-7AM, please contact night-coverage at [email or web address], password [email or web address] for any overnight needs.  08/19/2015, 10:21 AM

## 2015-08-19 NOTE — Progress Notes (Signed)
Patient Name: Matthew Sexton Date of Encounter: 08/19/2015  Principal Problem:   Chest pain Active Problems:   Morbid obesity (HCC)   Mixed dyslipidemia   DM type 2 with diabetic dyslipidemia (HCC)   AKI (acute kidney injury) (HCC)   NICM (nonischemic cardiomyopathy) (HCC)   Chronic combined systolic (congestive) and diastolic (congestive) heart failure (HCC)   Numbness of left hand   Frequent PVCs   NSVT (nonsustained ventricular tachycardia) (HCC)   Length of Stay:   SUBJECTIVE  The patient continues to have significant dizziness and SOB when standing up and walking.    CURRENT MEDS . amiodarone  400 mg Oral BID  . amLODipine  5 mg Oral Daily  . aspirin EC  81 mg Oral Daily  . atorvastatin  40 mg Oral q1800  . fenofibrate  160 mg Oral Daily  . heparin  5,000 Units Subcutaneous Q8H  . insulin aspart  0-5 Units Subcutaneous QHS  . insulin aspart  0-9 Units Subcutaneous TID WC  . omega-3 acid ethyl esters  2 g Oral BID   OBJECTIVE   Intake/Output Summary (Last 24 hours) at 08/19/15 1313 Last data filed at 08/19/15 0858  Gross per 24 hour  Intake    480 ml  Output    750 ml  Net   -270 ml   Filed Weights   08/17/15 0116 08/18/15 0359 08/19/15 0539  Weight: 309 lb 12.8 oz (140.524 kg) 319 lb 12.8 oz (145.06 kg) 322 lb 8 oz (146.285 kg)    PHYSICAL EXAM Filed Vitals:   08/18/15 1805 08/18/15 1900 08/19/15 0539 08/19/15 1010  BP: 133/66 119/63 112/61 121/56  Pulse: 59 52 49   Temp:  98 F (36.7 C) 97.5 F (36.4 C)   TempSrc:  Oral Oral   Resp:  18 20   Height:      Weight:   322 lb 8 oz (146.285 kg)   SpO2:  95% 98%    General: Alert, oriented x3, no distress Head: no evidence of trauma, PERRL, EOMI, no exophtalmos or lid lag, no myxedema, no xanthelasma; normal ears, nose and oropharynx Neck: normal jugular venous pulsations and no hepatojugular reflux; brisk carotid pulses without delay and no carotid bruits Chest: clear to auscultation, no signs of  consolidation by percussion or palpation, normal fremitus, symmetrical and full respiratory excursions Cardiovascular: normal position and quality of the apical impulse, regular rhythm, normal first and second heart sounds, no rubs or gallops, no murmur Abdomen: no tenderness or distention, no masses by palpation, no abnormal pulsatility or arterial bruits, normal bowel sounds, no hepatosplenomegaly Extremities: no clubbing, cyanosis or edema; 2+ radial, ulnar and brachial pulses bilaterally; 2+ right femoral, posterior tibial and dorsalis pedis pulses; 2+ left femoral, posterior tibial and dorsalis pedis pulses; no subclavian or femoral bruits Neurological: grossly nonfocal  LABS  CBC  Recent Labs  08/18/15 0310 08/19/15 0359  WBC 9.2 8.3  HGB 13.0 12.8*  HCT 39.6 39.0  MCV 89.2 89.0  PLT 244 220   Basic Metabolic Panel  Recent Labs  08/18/15 0310 08/19/15 0359  NA 135 139  K 3.7 4.0  CL 99* 104  CO2 27 24  GLUCOSE 178* 195*  BUN 26* 22*  CREATININE 1.09 1.12  CALCIUM 9.3 8.9  MG 2.6*  --    Liver Function Tests No results for input(s): AST, ALT, ALKPHOS, BILITOT, PROT, ALBUMIN in the last 72 hours. No results for input(s): LIPASE, AMYLASE in the last 72 hours. Cardiac Enzymes  Recent Labs  08/17/15 0202 08/17/15 0648 08/17/15 1153  TROPONINI <0.03 <0.03 <0.03   BNP Invalid input(s): POCBNP D-Dimer No results for input(s): DDIMER in the last 72 hours. Hemoglobin A1C  Recent Labs  08/17/15 0202  HGBA1C 8.0*   Fasting Lipid Panel  Recent Labs  08/17/15 0202  CHOL 104  HDL 35*  LDLCALC 49  TRIG 161  CHOLHDL 3.0   Thyroid Function Tests No results for input(s): TSH, T4TOTAL, T3FREE, THYROIDAB in the last 72 hours.  Invalid input(s): FREET3  Radiology Studies Imaging results have been reviewed and [name] Contrast  08/17/2015  CLINICAL DATA:  Left hand numbness and weakness. EXAM: MRI HEAD WITHOUT CONTRAST TECHNIQUE: Multiplanar, multiecho  pulse sequences of the brain and surrounding structures were obtained without intravenous contrast. COMPARISON:  None. FINDINGS: Brain Parenchyma: No acute infarct or intraparenchymal hemorrhage. No focal parenchymal signal abnormality. No mass lesion or midline shift. The major intracranial flow voids are preserved. The midline structures are normal. Ventricles, Sulci and Extra-axial Spaces: Normal for age. No extra-axial collection. Paranasal Sinuses and Mastoids: No fluid levels or advanced mucosal thickening. Orbits: Normal. Bones and Soft Tissues: The visualized skull base, calvarium and extracranial soft tissues are normal. IMPRESSION: Normal brain MRI for age. No findings to explain reported left hand weakness. Electronically Signed   By: Deatra Robinson M.D.   On: 08/17/2015 14:06   TELE SR, frequent monomorphic PVCs (RBBB, right superior axis pattern)   ASSESSMENT AND PLAN  He appears to be exquisitely sensitive to beta blockers and did not tolerate even a minute dose of carvedilol. It's too soon to tell if amiodarone will work and be well tolerated.  He doesn't feel any better, continues to have dizziness, we will keep over the weekend and consider an ablation if no improvement. Telemetry show heavy PVCs load and bradycardia, down to 39 at night, 40-60 during the day.  He has planned outpatient sleep study and f/u with Dr. Elberta Fortis.   Thurmon Fair, MD, [facility] [phone] office [phone] pager 08/19/2015 1:13 PM

## 2015-08-19 NOTE — Progress Notes (Signed)
Patient had an uneventful and restful night with no complaints, issues or concerns. Wife stayed at bedside during the night.

## 2015-08-20 DIAGNOSIS — R079 Chest pain, unspecified: Secondary | ICD-10-CM

## 2015-08-20 DIAGNOSIS — R001 Bradycardia, unspecified: Secondary | ICD-10-CM

## 2015-08-20 LAB — BASIC METABOLIC PANEL
ANION GAP: 7 (ref 5–15)
BUN: 21 mg/dL — ABNORMAL HIGH (ref 6–20)
CHLORIDE: 105 mmol/L (ref 101–111)
CO2: 26 mmol/L (ref 22–32)
Calcium: 8.8 mg/dL — ABNORMAL LOW (ref 8.9–10.3)
Creatinine, Ser: 1.07 mg/dL (ref 0.61–1.24)
GFR calc Af Amer: >60 mL/min (ref >60)
Glucose, Bld: 212 mg/dL — ABNORMAL HIGH (ref 65–99)
POTASSIUM: 3.9 mmol/L (ref 3.5–5.1)
SODIUM: 138 mmol/L (ref 135–145)

## 2015-08-20 LAB — GLUCOSE, CAPILLARY
GLUCOSE-CAPILLARY: 226 mg/dL — AB (ref 65–99)
GLUCOSE-CAPILLARY: 213 mg/dL — AB (ref 65–99)
GLUCOSE-CAPILLARY: 170 mg/dL — AB (ref 65–99)

## 2015-08-20 MED ORDER — INSULIN ASPART 100 UNIT/ML ~~LOC~~ SOLN
0.0000 [IU] | Freq: Three times a day (TID) | SUBCUTANEOUS | Status: DC
  Administered 2015-08-20 – 2015-08-21 (×3): 5 [IU] via SUBCUTANEOUS

## 2015-08-20 MED ORDER — INSULIN ASPART 100 UNIT/ML ~~LOC~~ SOLN
4.0000 [IU] | Freq: Three times a day (TID) | SUBCUTANEOUS | Status: DC
  Administered 2015-08-20: 4 [IU] via SUBCUTANEOUS

## 2015-08-20 MED ORDER — INSULIN ASPART 100 UNIT/ML ~~LOC~~ SOLN: 0.0000 [IU] | Freq: Every day | SUBCUTANEOUS | Status: DC

## 2015-08-20 NOTE — Progress Notes (Signed)
Pt and wife spoke with Dr Johney Frame short time ago. Both are very anxious and angry- pt wishes to leave hosp. Will inform Dr Johney Frame and Dr Darnelle Catalan

## 2015-08-20 NOTE — Progress Notes (Signed)
Progress Note    Matthew Sexton  KMQ:[id number] DOB: [birth year]  DOA: 08/16/2015 PCP: [name], MD    Brief Narrative:   Matthew Sexton is an 45 y.o. male with a PMH of combined systolic/diastolic CHF with EF 30-35 percent, obesity, CAD, hyperlipidemia, and diabetes who was admitted 08/16/15 with a chief complaint of chest pain and left hand numbness/weakness.  Assessment/Plan:   Principal Problems:   Chest pain In the setting of nonischemic cardiomyopathy and chronic systolic CHF/chronic diastolic CHF Status post recent cardiac catheterization 07/31/15 which showed mid RCA stenosis of 35%. Chest x-ray was negative for infiltrates. Troponins were negative. Continue coreg, lisinopril, statin and aspirin. Continue recently added HCTZ, consider changing to spironolactone in future for mortality benefit. Will need echo in 3 months. Plan is to discuss treatment options with Dr. Elberta Fortis tomorrow. Follow-up with cardiologist as an outpatient (needs close F/U, preferably with Dr. Lubertha Sayres who he had a good report with).    Frequent PVCs/NSVT (nonsustained ventricular tachycardia)/bradycardia (HCC), ongoing  Mildly bradycardic last night with heart rate dipping down into the 30s at times. Continue amiodarone for now. Symptoms don't seem to correlate with bradycardic events, but the patient continues to feel extremely fatigued at times. Dr. Johney Frame does not feel there is any indication for pacing at the present time.  Active Problems:   Morbid obesity (HCC), Ongoing Lifestyle changes encouraged including weight loss, low-fat/carbohydrate modified diet and regular exercise.Outpatient sleep study planned.    Mixed dyslipidemia, controlled Continue Lipitor, lovaza and fibrates. Will need lipid panel and LFT in 4-6 weeks. Cholesterol 104, LDL 49.    DM type 2 with diabetic dyslipidemia (HCC) Metformin on hold. Currently being managed with insulin sensitive SSI. CBGs 160-258. Change SSI to moderate scale  with 4 units of meal coverage. Hemoglobin A1c 8% indicating suboptimal outpatient control. Will need close follow-up with PCP to achieve better glycemic control.    AKI (acute kidney injury) (HCC), resolved Resolved.    Numbness of left hand, resolved MRI of the brain negative. Resolved.   Family Communication/Anticipated D/C date and plan/Code Status   DVT prophylaxis: Subcutaneous heparin ordered. Code Status: Full Code.  Family Communication: Wife updated at bedside. Disposition Plan: Home when cleared by cardiology, possibly 08/21/15.   Medical Consultants:    Cardiology   Procedures:   Dg Chest 2 View  08/16/2015  CLINICAL DATA:  Dizziness and shortness of breath with chest tightness for 4 hours. EXAM: CHEST  2 VIEW COMPARISON:  08/07/2015 FINDINGS: The heart size and mediastinal contours are within normal limits. Both lungs are clear. The visualized skeletal structures are unremarkable. IMPRESSION: No active cardiopulmonary disease. Electronically Signed   By: Kennith Center M.D.   On: 08/16/2015 19:11   Dg Chest 2 View  08/07/2015  CLINICAL DATA:  Acute onset of generalized chest pain. Initial encounter. EXAM: CHEST  2 VIEW COMPARISON:  Chest radiograph performed 07/30/2015 FINDINGS: The lungs are well-aerated and clear. There is no evidence of focal opacification, pleural effusion or pneumothorax. The heart is normal in size; the mediastinal contour is within normal limits. No acute osseous abnormalities are seen. IMPRESSION: No acute cardiopulmonary process seen. Electronically Signed   By: Roanna Raider M.D.   On: 08/07/2015 01:49   Dg Chest 2 View  07/30/2015  CLINICAL DATA:  Left-sided chest pain this morning. Shortness of breath. Former smoker. EXAM: CHEST  2 VIEW COMPARISON:  None. FINDINGS: The cardiomediastinal silhouette is within normal limits. The lungs are mildly hypoinflated.  There is mild central airway thickening. No segmental airspace consolidation, overt  pulmonary edema, pleural effusion, or pneumothorax is identified. No acute osseous abnormality is seen. IMPRESSION: Mild bronchitic changes. Electronically Signed   By: Sebastian Ache M.D.   On: 07/30/2015 10:23   [name] Contrast  08/17/2015  CLINICAL DATA:  Left hand numbness and weakness. EXAM: MRI HEAD WITHOUT CONTRAST TECHNIQUE: Multiplanar, multiecho pulse sequences of the brain and surrounding structures were obtained without intravenous contrast. COMPARISON:  None. FINDINGS: Brain Parenchyma: No acute infarct or intraparenchymal hemorrhage. No focal parenchymal signal abnormality. No mass lesion or midline shift. The major intracranial flow voids are preserved. The midline structures are normal. Ventricles, Sulci and Extra-axial Spaces: Normal for age. No extra-axial collection. Paranasal Sinuses and Mastoids: No fluid levels or advanced mucosal thickening. Orbits: Normal. Bones and Soft Tissues: The visualized skull base, calvarium and extracranial soft tissues are normal. IMPRESSION: Normal brain MRI for age. No findings to explain reported left hand weakness. Electronically Signed   By: Deatra Robinson M.D.   On: 08/17/2015 14:06    Anti-Infectives:   None.  Subjective:   Matthew Sexton denies dyspnea, and chest pain. Patient Continues to get dizzy and short of breath when standing up/ambulating, with significant activity intolerance and fatigue post ambulation. He is anxious and upset after speaking with cardiologist, and feels that he and his wife have been given mixed messages about his condition.  Objective:    Filed Vitals:   08/19/15 1010 08/19/15 1420 08/19/15 2127 08/20/15 0635  BP: 121/56 136/66 112/57   Pulse:  55 73   Temp:  98.3 F (36.8 C) 97.8 F (36.6 C)   TempSrc:  Oral Oral   Resp:  18    Height:      Weight:    147.6 kg (325 lb 6.4 oz)  SpO2:  99% 97%     Intake/Output Summary (Last 24 hours) at 08/20/15 0827 Last data filed at 08/20/15 9629  Gross per 24  hour  Intake    480 ml  Output    950 ml  Net   -470 ml   Filed Weights   08/18/15 0359 08/19/15 0539 08/20/15 0635  Weight: 145.06 kg (319 lb 12.8 oz) 146.285 kg (322 lb 8 oz) 147.6 kg (325 lb 6.4 oz)    Exam: General exam: Appears detached/disengaged. Obese. Respiratory system: Clear to auscultation. Respiratory effort normal. Cardiovascular system: S1 & S2 heard, RRR. No JVD,  rubs, gallops or clicks. No murmurs. Gastrointestinal system: Abdomen is nondistended, soft and nontender. No organomegaly or masses felt. Normal bowel sounds heard. Central nervous system: Alert and oriented. No focal neurological deficits. Extremities: No clubbing, edema, or cyanosis. Skin: No rashes, lesions or ulcers Psychiatry: Judgement and insight appear normal. Mood & affect angry/anxious.   Data Reviewed:   I have personally reviewed following labs and imaging studies:  Labs: Basic Metabolic Panel:  Recent Labs Lab 08/16/15 0949 08/16/15 1806 08/18/15 0310 08/19/15 0359 08/20/15 0234  NA 135 133* 135 139 138  K 4.6 3.9 3.7 4.0 3.9  CL 102 100* 99* 104 105  CO2 25 25 27 24 26   GLUCOSE 255* 179* 178* 195* 212*  BUN 37* 39* 26* 22* 21*  CREATININE 1.24 1.25* 1.09 1.12 1.07  CALCIUM 9.7 9.5 9.3 8.9 8.8*  MG  --   --  2.6*  --   --    GFR Estimated Creatinine Clearance: 130.9 mL/min (by C-G formula based on Cr of 1.07).  CBC:  Recent Labs Lab 08/16/15 1806 08/18/15 0310 08/19/15 0359  WBC 9.9 9.2 8.3  HGB 13.5 13.0 12.8*  HCT 39.6 39.6 39.0  MCV 88.6 89.2 89.0  PLT 263 244 220   Cardiac Enzymes:  Recent Labs Lab 08/17/15 0202 08/17/15 0648 08/17/15 1153  TROPONINI <0.03 <0.03 <0.03   CBG:  Recent Labs Lab 08/19/15 0612 08/19/15 1103 08/19/15 1556 08/19/15 2125 08/20/15 0632  GLUCAP 200* 258* 197* 160* 226*   Hgb A1c: No results for input(s): HGBA1C in the last 72 hours. Lipid Profile: No results for input(s): CHOL, HDL, LDLCALC, TRIG, CHOLHDL, LDLDIRECT  in the last 72 hours. Microbiology No results found for this or any previous visit (from the past 240 hour(s)).   Medications:   . amiodarone  400 mg Oral BID  . amLODipine  5 mg Oral Daily  . aspirin EC  81 mg Oral Daily  . atorvastatin  40 mg Oral q1800  . fenofibrate  160 mg Oral Daily  . heparin  5,000 Units Subcutaneous Q8H  . insulin aspart  0-5 Units Subcutaneous QHS  . insulin aspart  0-9 Units Subcutaneous TID WC  . omega-3 acid ethyl esters  2 g Oral BID   Continuous Infusions:   Time spent: 35 minutes with > 50% of time discussing current diagnostic test results, clinical impression and plan of care. An extensive discussion was held with the patient and his wife after the cardiologist rounded on the patient and he was upset with his impression of the interaction.   LOS: 1 day   Shantal Roan  [facility] Pager [phone]. If unable to reach me by pager, please call my cell phone at [phone].  *Please refer to [email or web address], password [email or web address] to get updated schedule on who will round on this patient, as hospitalists switch teams weekly. If 7PM-7AM, please contact night-coverage at [email or web address], password [email or web address] for any overnight needs.  08/20/2015, 8:27 AM

## 2015-08-20 NOTE — Progress Notes (Addendum)
SUBJECTIVE: I am meeting the patient and his wife for the first time.  The patient and his wife are quite anxious and frustrated.  Seems that they have received lots of conflicting information and no real answers.  I have reassured him that he does not have CAD and that we have not seen any life threatening arrhythmias.  He has not tolerated beta blockers well due to bradycardia but rates seem better off of them. No indication for pacing acutely.   He has PVCs though I am not convinced that these correlate to his symptoms.  He has been initiated on amiodarone for his PVCs without effect currently.    Marland Kitchen amiodarone  400 mg Oral BID  . amLODipine  5 mg Oral Daily  . aspirin EC  81 mg Oral Daily  . atorvastatin  40 mg Oral q1800  . fenofibrate  160 mg Oral Daily  . heparin  5,000 Units Subcutaneous Q8H  . insulin aspart  0-15 Units Subcutaneous TID WC  . insulin aspart  0-5 Units Subcutaneous QHS  . insulin aspart  4 Units Subcutaneous TID WC  . omega-3 acid ethyl esters  2 g Oral BID      OBJECTIVE: Physical Exam: Filed Vitals:   08/19/15 1010 08/19/15 1420 08/19/15 2127 08/20/15 0635  BP: 121/56 136/66 112/57   Pulse:  55 73   Temp:  98.3 F (36.8 C) 97.8 F (36.6 C)   TempSrc:  Oral Oral   Resp:  18    Height:      Weight:    325 lb 6.4 oz (147.6 kg)  SpO2:  99% 97%     Intake/Output Summary (Last 24 hours) at 08/20/15 1022 Last data filed at 08/20/15 1610  Gross per 24 hour  Intake    240 ml  Output    950 ml  Net   -710 ml    Telemetry reveals sinus rhythm with PVCs  GEN- The patient is obese and very anxious appearing, alert and oriented x 3 today.   Head- normocephalic, atraumatic Eyes-  Sclera clear, conjunctiva pink Ears- hearing intact Oropharynx- clear Neck- supple,   Lungs- Clear to ausculation bilaterally, normal work of breathing Heart- Regular rate and rhythm with ectopy GI- soft, NT, ND, + BS Extremities- no clubbing, cyanosis, or edema Skin- no  rash or lesion Psych- very frustrated and anxious Neuro- strength and sensation are intact  LABS: Basic Metabolic Panel:  Recent Labs  96/04/54 0310 08/19/15 0359 08/20/15 0234  NA 135 139 138  K 3.7 4.0 3.9  CL 99* 104 105  CO2 GLUCOSE 178* 195* 212*  BUN 26* 22* 21*  CREATININE 1.09 1.12 1.07  CALCIUM 9.3 8.9 8.8*  MG 2.6*  --   --    Liver Function Tests: No results for input(s): AST, ALT, ALKPHOS, BILITOT, PROT, ALBUMIN in the last 72 hours. No results for input(s): LIPASE, AMYLASE in the last 72 hours. CBC:  Recent Labs  08/18/15 0310 08/19/15 0359  WBC 9.2 8.3  HGB 13.0 12.8*  HCT 39.6 39.0  MCV 89.2 89.0  PLT 244 220   Cardiac Enzymes:  Recent Labs  08/17/15 1153  TROPONINI <0.03    ASSESSMENT AND PLAN:  1. PVCs The patient has PVCs arising from the posterolateral LV.  I am not convinced that these are the cause for his cardiomyopathy or his symptoms.  He has been initiated on amiodarone which he is tolerating.  He still has  PVCs though it may be too early to tell if they will respond to amiodarone.  The patient has apparently had some conversations about ablation.  I will defer to Dr Elberta Fortis  2. Nonischemic CM Unclear cause Workup ongoing with cardiology Cath reviewed Off beta blocker due to bradycardia Not sure why he is not currently on ace inhibitor, would resume if able  3. Sinus bradycardia Improved off of coreg No indication for pacing  I have advised that he stay in the hospital and discuss options further with Dr Elberta Fortis tomorrow.  I think that he will do best with this continuity.  I worry that he is very agitated at this time and that he may not be willing to stay in the hospital for that to happen.   Hillis Range, MD 08/20/2015 10:22 AM

## 2015-08-20 NOTE — Progress Notes (Signed)
Patient ambulated with his wife and later reported that the patient had chest pain near the apex during the walk with headache (pressure feeling), dizzy and had hard time finding words to communicate. Patient denied any chest pain after the walk and telemetry stated that he didn't have any new rhythm changes while walking (past 15 minutes).   Notified the doctor about the status and advised to closely monitor the patient. As I was about to leave the unit to float to another floor (2S around 0215), telemetry staff reported patient had a bradycardia of high 30's for few seconds and heart rate currently being in 50's and 40's. Notified the charge nurse about the patient's bradycardia and took over the patient as I left the floor.

## 2015-08-21 ENCOUNTER — Other Ambulatory Visit: Payer: Self-pay | Admitting: Cardiology

## 2015-08-21 DIAGNOSIS — I493 Ventricular premature depolarization: Secondary | ICD-10-CM

## 2015-08-21 LAB — BASIC METABOLIC PANEL
ANION GAP: 7 (ref 5–15)
BUN: 18 mg/dL (ref 6–20)
CHLORIDE: 104 mmol/L (ref 101–111)
CO2: 26 mmol/L (ref 22–32)
CREATININE: 1.14 mg/dL (ref 0.61–1.24)
Calcium: 9.1 mg/dL (ref 8.9–10.3)
GFR calc non Af Amer: >60 mL/min (ref >60)
Glucose, Bld: 173 mg/dL — ABNORMAL HIGH (ref 65–99)
POTASSIUM: 4.2 mmol/L (ref 3.5–5.1)
SODIUM: 137 mmol/L (ref 135–145)

## 2015-08-21 LAB — GLUCOSE, CAPILLARY
GLUCOSE-CAPILLARY: 237 mg/dL — AB (ref 65–99)
GLUCOSE-CAPILLARY: 203 mg/dL — AB (ref 65–99)

## 2015-08-21 LAB — MAGNESIUM: MAGNESIUM: 2 mg/dL (ref 1.7–2.4)

## 2015-08-21 MED ORDER — AMLODIPINE BESYLATE 5 MG PO TABS
5.0000 mg | ORAL_TABLET | Freq: Every day | ORAL | Status: DC
Start: 2015-08-21 — End: 2015-08-30

## 2015-08-21 MED ORDER — INSULIN ASPART 100 UNIT/ML ~~LOC~~ SOLN: 5.0000 [IU] | Freq: Three times a day (TID) | SUBCUTANEOUS | Status: DC

## 2015-08-21 MED ORDER — INSULIN ASPART 100 UNIT/ML ~~LOC~~ SOLN
5.0000 [IU] | Freq: Three times a day (TID) | SUBCUTANEOUS | Status: DC
  Administered 2015-08-21: 5 [IU] via SUBCUTANEOUS

## 2015-08-21 MED ORDER — AMIODARONE HCL 400 MG PO TABS: 400.0000 mg | ORAL_TABLET | Freq: Two times a day (BID) | ORAL | Status: DC

## 2015-08-21 NOTE — Progress Notes (Signed)
   SUBJECTIVE: Doing well without major complaints.  No chest pain or SOB, continued minor fatigue.  Marland Kitchen amiodarone  400 mg Oral BID  . amLODipine  5 mg Oral Daily  . aspirin EC  81 mg Oral Daily  . atorvastatin  40 mg Oral q1800  . fenofibrate  160 mg Oral Daily  . heparin  5,000 Units Subcutaneous Q8H  . insulin aspart  0-15 Units Subcutaneous TID WC  . insulin aspart  0-5 Units Subcutaneous QHS  . insulin aspart  5 Units Subcutaneous TID WC  . omega-3 acid ethyl esters  2 g Oral BID      OBJECTIVE: Physical Exam: Filed Vitals:   08/20/15 1022 08/20/15 1156 08/20/15 2045 08/21/15 0606  BP: 138/73 123/74 114/72 107/62  Pulse:  55 54 46  Temp:  98.6 F (37 C) 98.2 F (36.8 C) 97.7 F (36.5 C)  TempSrc:  Oral Oral Oral  Resp:  18 18 18   Height:      Weight:      SpO2:  98% 98% 99%    Intake/Output Summary (Last 24 hours) at 08/21/15 1225 Last data filed at 08/21/15 0901  Gross per 24 hour  Intake   1060 ml  Output    650 ml  Net    410 ml    Telemetry reveals sinus rhythm with PVCs  GEN- The patient is obese and very anxious appearing, alert and oriented x 3 today.   Head- normocephalic, atraumatic Eyes-  Sclera clear, conjunctiva pink Ears- hearing intact Oropharynx- clear Neck- supple,   Lungs- Clear to ausculation bilaterally, normal work of breathing Heart- Regular rate and rhythm with ectopy GI- soft, NT, ND, + BS Extremities- no clubbing, cyanosis, or edema Skin- no rash or lesion Psych- very frustrated and anxious Neuro- strength and sensation are intact  LABS: Basic Metabolic Panel:  Recent Labs  76/80/88 0234 08/21/15 0237  NA 138 137  K 3.9 4.2  CL 105 104  CO2 26 26  GLUCOSE 212* 173*  BUN 21* 18  CREATININE 1.07 1.14  CALCIUM 8.8* 9.1  MG  --  2.0   Liver Function Tests: No results for input(s): AST, ALT, ALKPHOS, BILITOT, PROT, ALBUMIN in the last 72 hours. No results for input(s): LIPASE, AMYLASE in the last 72  hours. CBC:  Recent Labs  08/19/15 0359  WBC 8.3  HGB 12.8*  HCT 39.0  MCV 89.0  PLT 220   Cardiac Enzymes: No results for input(s): CKTOTAL, CKMB, CKMBINDEX, TROPONINI in the last 72 hours.  ASSESSMENT AND PLAN:  1. PVCs The patient has PVCs arising from the posterolateral LV.  On amiodarone currently.  Possible that PVCs are related to cardiomyopathy but these have not been properly assessed.  Matthew Sexton therefore placed 48 hour monitor to further quantify.  2. Nonischemic CM Unclear cause Workup ongoing with cardiology Cath reviewed Off beta blocker due to bradycardia  3. Sinus bradycardia Improved off of coreg No indication for pacing  OK for discharge today with monitor to be placed tomorrow.  Matthew Adkison Jorja Loa, MD 08/21/2015 12:25 PM

## 2015-08-21 NOTE — Care Management Note (Signed)
Case Management Note  Patient Details  Name: Matthew Sexton MRN: [id number] Date of Birth: Mar 08, 1970  Subjective/Objective:  Pt presented for chest pain. Pt is without insurance and has PCP.                   Action/Plan: Hospital f/u scheduled. No further needs from CM at this time.    Expected Discharge Date:                  Expected Discharge Plan:  Home/Self Care  In-House Referral:  NA  Discharge planning Services  CM Consult  Post Acute Care Choice:  NA Choice offered to:  NA  DME Arranged:  N/A DME Agency:  NA  HH Arranged:  NA HH Agency:     Status of Service:  Completed, signed off  If discussed at Long Length of Stay Meetings, dates discussed:    Additional Comments:  Gala Lewandowsky, RN 08/21/2015, 3:48 PM

## 2015-08-21 NOTE — Progress Notes (Signed)
Holter (48hr) pickup scheduled for tomorrow at 12pm. Ronie Spies PA-C

## 2015-08-21 NOTE — Progress Notes (Signed)
Pt has orders to be discharged. Discharge instructions given and pt has no additional questions at this time. Medication regimen reviewed and pt educated. Pt verbalized understanding and has no additional questions. Telemetry box removed. IV removed and site in good condition. Pt stable and waiting for transportation. 

## 2015-08-21 NOTE — Progress Notes (Signed)
Inpatient Diabetes Program Recommendations  AACE/ADA: New Consensus Statement on Inpatient Glycemic Control (2015)  Target Ranges:  Prepandial:   less than 140 mg/dL      Peak postprandial:   less than 180 mg/dL (1-2 hours)      Critically ill patients:  140 - 180 mg/dL  Results for AVYAY, NAKAYAMA (MRN [id number]) as of 08/21/2015 11:59  Ref. Range 08/19/2015 21:25 08/20/2015 06:32 08/20/2015 12:04 08/20/2015 20:42 08/21/2015 06:26  Glucose-Capillary Latest Ref Range: 65-99 mg/dL 143 (H) 888 (H) 757 (H) 170 (H) 237 (H)   Results for RONAL, KALAL (MRN [id number]) as of 08/21/2015 11:59  Ref. Range 08/17/2015 02:02  Hemoglobin A1C Latest Ref Range: 4.8-5.6 % 8.0 (H)    Review of Glycemic Control  Diabetes history: DM2 Outpatient Diabetes medications: Metformin 1 gm bid Current orders for Inpatient glycemic control: Novolog 5 units tid meal coverage + Novolog correction 0-9 units tid + 0-5 units hs  Inpatient Diabetes Program Recommendations:  Spoke with patient concerning elevated A1c 8.0 and glycemic control. Patient states he has gotten "lax" on nutrition management, stopped going to the gym, and keeping doctor appts. Gave handout and reviewed normal A1c and basic plate method nutrition. Pt. States willingness to regroup and focus on taking care of himself. Has had diabetes education in the past. Patient may benefit from adding Glucotrol to assist with meals.  Thank you, Billy Fischer. Chin Wachter, RN, MSN, CDE Inpatient Glycemic Control Team Team Pager [phone] (8am-5pm) 08/21/2015 12:04 PM

## 2015-08-21 NOTE — Discharge Summary (Signed)
Physician Discharge Summary  Matthew Sexton:[id number] DOB: [birth year] DOA: 08/16/2015  PCP: [name], MD  Admit date: 08/16/2015 Discharge date: 08/21/2015   Recommendations for Outpatient Follow-Up:   1. The patient will follow-up with EP 08/22/15 for 48 hour monitor.   Discharge Diagnosis:   Principal Problem:    Chest pain Active Problems:    Morbid obesity (HCC)    Mixed dyslipidemia    DM type 2 with diabetic dyslipidemia (HCC)    AKI (acute kidney injury) (HCC)    NICM (nonischemic cardiomyopathy) (HCC)    Chronic combined systolic (congestive) and diastolic (congestive) heart failure (HCC)    Numbness of left hand    Frequent PVCs    NSVT (nonsustained ventricular tachycardia) (HCC)    Bradycardia   Discharge disposition:  Home.    Discharge Condition: Improved.  Diet recommendation: Low sodium, heart healthy.  Carbohydrate-modified.    History of Present Illness:   Matthew Sexton is an 45 y.o. male with a PMH of combined systolic/diastolic CHF with EF 30-35 percent, obesity, CAD, hyperlipidemia, and diabetes who was admitted 08/16/15 with a chief complaint of chest pain and left hand numbness/weakness.  Hospital Course by Problem:   Principal Problems:  Chest pain In the setting of nonischemic cardiomyopathy and chronic systolic CHF/chronic diastolic CHF Status post recent cardiac catheterization 07/31/15 which showed mid RCA stenosis of 35%. Chest x-ray was negative for infiltrates. Troponins were negative. Continue amiodarone, lisinopril, statin and aspirin. Continue PRN Lasix, consider changing to spironolactone in future for mortality benefit. Will need echo in 3 months.    Frequent PVCs/NSVT (nonsustained ventricular tachycardia)/bradycardia (HCC), ongoing Mildly bradycardic During hospital stay with heart rate dipping down into the 30s at times. Continue amiodarone, beta blocker discontinued secondary to intolerance. Symptoms don't seem to  correlate with bradycardic events, but the patient continues to feel extremely fatigued at times. Dr. Johney Frame does not feel there is any indication for pacing at the present time. Will follow-up with EP cardiologist for 48 hour monitoring.  Active Problems:  Morbid obesity (HCC), Ongoing Lifestyle changes encouraged including weight loss, low-fat/carbohydrate modified diet and regular exercise.Outpatient sleep study planned.   Mixed dyslipidemia, controlled Continue Lipitor and lovaza. Fibrate discontinued due to a potential interaction with the statin that could result in cardiomyopathy or severe rhabdomyolysis. Will need lipid panel and LFT in 4-6 weeks. Cholesterol 104, LDL 49.   DM type 2 with diabetic dyslipidemia (HCC) Metformin held during admission and the patient was managed with sliding scale insulin and meal coverage. Hemoglobin A1c 8% indicating suboptimal outpatient control, but reports that he had not been on the metformin for very long. Will need close follow-up with PCP to achieve better glycemic control.   AKI (acute kidney injury) (HCC), resolved Resolved. Okay to resume lisinopril at discharge.   Numbness of left hand, resolved MRI of the brain negative. Resolved.   Medical Consultants:    Cardiology   Discharge Exam:   Filed Vitals:   08/21/15 0606 08/21/15 1410  BP: 107/62 111/67  Pulse: 46 55  Temp: 97.7 F (36.5 C)   Resp: 18 18   Filed Vitals:   08/20/15 1156 08/20/15 2045 08/21/15 0606 08/21/15 1410  BP: 123/74 114/72 107/62 111/67  Pulse: 55 54 46 55  Temp: 98.6 F (37 C) 98.2 F (36.8 C) 97.7 F (36.5 C)   TempSrc: Oral Oral Oral   Resp: 18 18 18 18   Height:      Weight:  SpO2: 98% 98% 99% 99%    General exam: Appears comfortable, no distress. Obese. Respiratory system: Clear to auscultation. Respiratory effort normal. Cardiovascular system: S1 & S2 heard, RRR. No JVD, rubs, gallops or clicks. No murmurs. Gastrointestinal  system: Abdomen is nondistended, soft and nontender. No organomegaly or masses felt. Normal bowel sounds heard. Central nervous system: Alert and oriented. No focal neurological deficits. Extremities: No clubbing, edema, or cyanosis. Skin: No rashes, lesions or ulcers Psychiatry: Judgement and insight appear normal. Mood & affect appropriate.   The results of significant diagnostics from this hospitalization (including imaging, microbiology, ancillary and laboratory) are listed below for reference.     Procedures and Diagnostic Studies:   Dg Chest 2 View  08/16/2015  CLINICAL DATA:  Dizziness and shortness of breath with chest tightness for 4 hours. EXAM: CHEST  2 VIEW COMPARISON:  08/07/2015 FINDINGS: The heart size and mediastinal contours are within normal limits. Both lungs are clear. The visualized skeletal structures are unremarkable. IMPRESSION: No active cardiopulmonary disease. Electronically Signed   By: Kennith Center M.D.   On: 08/16/2015 19:11   [name] Contrast  08/17/2015  CLINICAL DATA:  Left hand numbness and weakness. EXAM: MRI HEAD WITHOUT CONTRAST TECHNIQUE: Multiplanar, multiecho pulse sequences of the brain and surrounding structures were obtained without intravenous contrast. COMPARISON:  None. FINDINGS: Brain Parenchyma: No acute infarct or intraparenchymal hemorrhage. No focal parenchymal signal abnormality. No mass lesion or midline shift. The major intracranial flow voids are preserved. The midline structures are normal. Ventricles, Sulci and Extra-axial Spaces: Normal for age. No extra-axial collection. Paranasal Sinuses and Mastoids: No fluid levels or advanced mucosal thickening. Orbits: Normal. Bones and Soft Tissues: The visualized skull base, calvarium and extracranial soft tissues are normal. IMPRESSION: Normal brain MRI for age. No findings to explain reported left hand weakness. Electronically Signed   By: Deatra Robinson M.D.   On: 08/17/2015 14:06     Labs:     Basic Metabolic Panel:  Recent Labs Lab 08/16/15 1806 08/18/15 0310 08/19/15 0359 08/20/15 0234 08/21/15 0237  NA 133* 135 139 138 137  K 3.9 3.7 4.0 3.9 4.2  CL 100* 99* 104 105 104  CO2 25 27 24 26 26   GLUCOSE 179* 178* 195* 212* 173*  BUN 39* 26* 22* 21* 18  CREATININE 1.25* 1.09 1.12 1.07 1.14  CALCIUM 9.5 9.3 8.9 8.8* 9.1  MG  --  2.6*  --   --  2.0   GFR Estimated Creatinine Clearance: 122.8 mL/min (by C-G formula based on Cr of 1.14). Liver Function Tests: No results for input(s): AST, ALT, ALKPHOS, BILITOT, PROT, ALBUMIN in the last 168 hours. No results for input(s): LIPASE, AMYLASE in the last 168 hours. No results for input(s): AMMONIA in the last 168 hours. Coagulation profile No results for input(s): INR, PROTIME in the last 168 hours.  CBC:  Recent Labs Lab 08/16/15 1806 08/18/15 0310 08/19/15 0359  WBC 9.9 9.2 8.3  HGB 13.5 13.0 12.8*  HCT 39.6 39.6 39.0  MCV 88.6 89.2 89.0  PLT 263 244 220   Cardiac Enzymes:  Recent Labs Lab 08/17/15 0202 08/17/15 0648 08/17/15 1153  TROPONINI <0.03 <0.03 <0.03   BNP: Invalid input(s): POCBNP CBG:  Recent Labs Lab 08/20/15 0632 08/20/15 1204 08/20/15 2042 08/21/15 0626 08/21/15 1136  GLUCAP 226* 213* 170* 237* 203*     Discharge Instructions:   Discharge Instructions    (HEART FAILURE PATIENTS) Call MD:  Anytime you have any of the  following symptoms: 1) 3 pound weight gain in 24 hours or 5 pounds in 1 week 2) shortness of breath, with or without a dry hacking cough 3) swelling in the hands, feet or stomach 4) if you have to sleep on extra pillows at night in order to breathe.    Complete by:  As directed      Call MD for:  extreme fatigue    Complete by:  As directed      Call MD for:  persistant dizziness or light-headedness    Complete by:  As directed      Diet - low sodium heart healthy    Complete by:  As directed      Diet Carb Modified    Complete by:  As directed      Increase  activity slowly    Complete by:  As directed             Medication List    STOP taking these medications        carvedilol 3.125 MG tablet  Commonly known as:  COREG     fenofibrate 160 MG tablet     hydrochlorothiazide 25 MG tablet  Commonly known as:  HYDRODIURIL      TAKE these medications        amiodarone 400 MG tablet  Commonly known as:  PACERONE  Take 1 tablet (400 mg total) by mouth 2 (two) times daily.     amLODipine 5 MG tablet  Commonly known as:  NORVASC  Take 1 tablet (5 mg total) by mouth daily.     aspirin 81 MG EC tablet  Take 1 tablet (81 mg total) by mouth daily.     atorvastatin 40 MG tablet  Commonly known as:  LIPITOR  Take 1 tablet (40 mg total) by mouth daily at 6 PM.     furosemide 40 MG tablet  Commonly known as:  LASIX  Take 1 tab by mouth as needed for shortness of breath or edema in legs, feet or ankles     lisinopril 10 MG tablet  Commonly known as:  PRINIVIL,ZESTRIL  Take 1 tablet (10 mg total) by mouth daily.     metFORMIN 1000 MG tablet  Commonly known as:  GLUCOPHAGE  Take 1 tablet (1,000 mg total) by mouth daily after supper.     nitroGLYCERIN 0.4 MG SL tablet  Commonly known as:  NITROSTAT  Place 0.4 mg under the tongue every 5 (five) minutes as needed for chest pain (3 DOSES MAX).     omega-3 acid ethyl esters 1 g capsule  Commonly known as:  LOVAZA  Take 2 capsules (2 g total) by mouth 2 (two) times daily.           Follow-up Information    Follow up with Will Jorja Loa, MD On 08/30/2015.   Specialty:  Cardiology   Why:  at [facility] information:   [address] [place] [address] [phone]       Follow up with [facility].   Specialty:  Cardiology   Why:  [facility] - pick up your heart monitor tomorrow (7/18) at 12pm. We will go over instructions for how to use it and how to return it.   Contact information:   [address] [place] [address] [phone]      Follow up with [name], MD. Schedule an appointment as  soon as possible for a visit in 1 week.   Specialty:  Family Medicine   Why:  Need close follow up of blood pressure and diabetes control.   Contact information:   [address] [place] [address] [phone]        Time coordinating discharge: 35 minutes.  Signed:  RAMA,CHRISTINA  Pager [phone] [facility] 08/21/2015, 3:06 PM

## 2015-08-23 ENCOUNTER — Ambulatory Visit (INDEPENDENT_AMBULATORY_CARE_PROVIDER_SITE_OTHER): Payer: Self-pay

## 2015-08-23 DIAGNOSIS — I493 Ventricular premature depolarization: Secondary | ICD-10-CM

## 2015-08-24 ENCOUNTER — Telehealth: Payer: Self-pay | Admitting: Cardiology

## 2015-08-24 MED ORDER — AMIODARONE HCL 200 MG PO TABS: 400.0000 mg | ORAL_TABLET | Freq: Two times a day (BID) | ORAL | Status: DC

## 2015-08-24 NOTE — Telephone Encounter (Signed)
New rx sent to pharmacy for 200 mg tablets.   Wife thanks me for the help.

## 2015-08-24 NOTE — Telephone Encounter (Signed)
New message      Pt c/o medication issue:  1. Name of Medication: amiodarone 2. How are you currently taking this medication (dosage and times per day)? Have not had medication since he left the hosp 2 days ago 3. Are you having a reaction (difficulty breathing--STAT)? no  4. What is your medication issue? Pt cannot afford 400mg  on amiodarone.  Please call in 200mg  tablets to [facility] and he will take 2 tablets at a time.  It is much cheaper.  Pt has not had amiodarone in 2 days (since he left the hosp)  Please call when medication has been called in

## 2015-08-29 NOTE — Progress Notes (Signed)
Electrophysiology Office Note   Date:  08/30/2015   ID:  Matthew, Sexton 07-30-1970, MRN [id number]  PCP:  [name], MD  Primary Electrophysiologist:  Regan Lemming, MD    Chief Complaint  Patient presents with  . Hospitalization Follow-up  . PVC's  . Shortness of Breath     History of Present Illness: Matthew Sexton is a 45 y.o. male who presents today for electrophysiology evaluation.   History of recently diagnosed NICM and chronic combined CHF of unclear etiology, NSVT, bradycardia, DM, HLD, hypertriglyceridemia, possible sleep apnea, morbid obesity who presented back to [facility] with complaints of chest pain, fatigue.  In June 2017 he was admitted for concern for [place] with low trending troponin. He had runs of NSVT on telemetry. LHC 07/31/15: 35% mRCA, otherwise normal cors, LVEF 35-45% with severely elevated LVEDP - suspected his CP was related to his CHF and he was treated with diuresis.  2D Echo 07/31/15: EF 30-35%, no thrombus seen, diffuse HK, grade 2 DD. He was noted to have bradycardia with HR in the mid 40s to low 50s despite holding Coreg. He was started on metformin for his DM along with baby dose Coreg (1/2 of 3.125), lisinopril, statin, spironolactone, and aspirin with plans for outpt sleep study. K and Mg were normal during that admission during episodes of NSVT.  Was admitted to the hospital 7//17//17 with PVCs, chest pain. He was also bradycardic into the 30s at times.  He was found to be bradycardic into the 30s.  A 48 hour monitor was fitted at discharge.  Today, he denies symptoms of PND, claudication, dizziness, presyncope, syncope, bleeding, or neurologic sequela. The patient is tolerating medications without difficulties and is otherwise without complaint today.    Past Medical History:  Diagnosis Date  . Bradycardia   . Chronic combined systolic and diastolic CHF (congestive heart failure) (HCC)   . Diabetes mellitus (HCC)   . HLD (hyperlipidemia)   .  Non-ischemic cardiomyopathy (HCC)    a. Adm 07/2015 with CP - Echo 08/01/15 - EF of 30-35%, global hypokinesis, grade 2 DD. LHC 07/31/15: 35% mRCA, otherwise normal cors, LVEF 35-45% with severely elevated LVEDP - it was suspected his CP was related to his CHF.  Marland Kitchen NSVT (nonsustained ventricular tachycardia) (HCC)   . Obesity   . Snoring   . Suspected sleep apnea    Past Surgical History:  Procedure Laterality Date  . APPENDECTOMY    . CARDIAC CATHETERIZATION N/A 07/31/2015   Procedure: Left Heart Cath and Coronary Angiography;  Surgeon: Marykay Lex, MD;  Location: [facility] INVASIVE CV LAB;  Service: Cardiovascular;  Laterality: N/A;     Current Outpatient Prescriptions  Medication Sig Dispense Refill  . amiodarone (PACERONE) 200 MG tablet Take 2 tablets (400 mg total) by mouth 2 (two) times daily. 60 tablet 0  . amLODipine (NORVASC) 5 MG tablet Take 1 tablet (5 mg total) by mouth daily. 30 tablet 2  . aspirin EC 81 MG EC tablet Take 1 tablet (81 mg total) by mouth daily.    Marland Kitchen atorvastatin (LIPITOR) 40 MG tablet Take 1 tablet (40 mg total) by mouth daily at 6 PM. 30 tablet 6  . furosemide (LASIX) 40 MG tablet Take 1 tab by mouth as needed for shortness of breath or edema in legs, feet or ankles 30 tablet 3  . lisinopril (PRINIVIL,ZESTRIL) 10 MG tablet Take 1 tablet (10 mg total) by mouth daily. 30 tablet 6  . metFORMIN (GLUCOPHAGE) 1000  MG tablet Take 1 tablet (1,000 mg total) by mouth daily after supper. 30 tablet 6  . nitroGLYCERIN (NITROSTAT) 0.4 MG SL tablet Place 0.4 mg under the tongue every 5 (five) minutes as needed for chest pain (3 DOSES MAX).    Marland Kitchen omega-3 acid ethyl esters (LOVAZA) 1 g capsule Take 2 capsules (2 g total) by mouth 2 (two) times daily. 120 capsule 6   No current facility-administered medications for this visit.     Allergies:   Review of patient's allergies indicates no known allergies.   Social History:  The patient  reports that he has quit smoking. He has never  used smokeless tobacco. He reports that he does not drink alcohol or use drugs.   Family History:  The patient's family history includes Angina (age of onset: 64) in his sister; Coronary artery disease (age of onset: 13) in his brother; Heart attack (age of onset: 2) in his father.    ROS:  Please see the history of present illness.   Otherwise, review of systems is positive for sweats, fatigue, .   All other systems are reviewed and negative.    PHYSICAL EXAM: VS:  BP 116/72   Pulse (!) 57   Ht 6\' 2"  (1.88 m)   Wt (!) 335 lb 12.8 oz (152.3 kg)   BMI 43.11 kg/m  , BMI Body mass index is 43.11 kg/m. GEN: Well nourished, well developed, in no acute distress  HEENT: normal  Neck: no JVD, carotid bruits, or masses Cardiac: RRR; no murmurs, rubs, or gallops,no edema  Respiratory:  clear to auscultation bilaterally, normal work of breathing GI: soft, nontender, nondistended, + BS MS: no deformity or atrophy  Skin: warm and dry Neuro:  Strength and sensation are intact Psych: euthymic mood, full affect  EKG:  EKG is not ordered today.  Recent Labs: 07/30/2015: TSH 0.517 08/17/2015: B Natriuretic Peptide 21.7 08/19/2015: Hemoglobin 12.8; Platelets 220 08/21/2015: BUN 18; Creatinine, Ser 1.14; Magnesium 2.0; Potassium 4.2; Sodium 137    Lipid Panel     Component Value Date/Time   CHOL 104 08/17/2015 0202   TRIG 100 08/17/2015 0202   HDL 35 (L) 08/17/2015 0202   CHOLHDL 3.0 08/17/2015 0202   VLDL 20 08/17/2015 0202   LDLCALC 49 08/17/2015 0202     Wt Readings from Last 3 Encounters:  08/30/15 (!) 335 lb 12.8 oz (152.3 kg)  08/20/15 (!) 325 lb 6.4 oz (147.6 kg)  08/16/15 (!) 325 lb 3.2 oz (147.5 kg)      Other studies Reviewed: Additional studies/ records that were reviewed today include:  TTE 08/01/15 - Left ventricle: The cavity size was normal. Wall thickness was   normal. Systolic function was moderately to severely reduced. The   estimated ejection fraction was in  the range of 30% to 35%. No   mural thrombus seen with Definity contrast. Diffuse hypokinesis.   Doppler parameters are consistent with pseudonormal left   ventricular relaxation (grade 2 diastolic dysfunction). The E/A   ratio is >1.5. The E/e&' ratio is >20, suggesting markedly   elevated LV filling pressure. - Left atrium: The atrium was normal in size. - Tricuspid valve: There was no significant regurgitation. - Inferior vena cava: The vessel was normal in size. The   respirophasic diameter changes were in the normal range (>= 50%),   consistent with normal central venous pressure.  Cath 07/31/15 1. 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 2. There is moderate left ventricular systolic dysfunction. 3. Severely elevated  LVEDP.  ASSESSMENT AND PLAN:  1.  PVCs: Had a right bundle superior axis configuration. Likely coming from posterior LV around the mitral valve. His PVCs could be potentially related to his cardiomyopathy. 48 hour monitor while on amiodarone did show 19.1% PVCs. Due to the high burden of PVCs, and his nonischemic cardiomyopathy, did discuss the potential for ablation. Risks and benefits of ablation include bleeding, tamponade, heart block, stroke, among others. We will stop his amiodarone, as he is continuing to have PVCs. He understands the risks and benefits of ablation, and has agreed to the procedure.  2. Nonischemic cardiomyopathy: Unable to tolerate beta blockers due to episodes of bradycardia. He is on 10 mg of lisinopril as well as amlodipine. Will stop his amlodipine and increase his lisinopril to 20 mg. He has had some issues with bradycardia, but he would benefit from beta blocker therapy. We'll start him on 12.5 mg of Toprol-XL and hopefully be able to titrate up.    Current medicines are reviewed at length with the patient today.   The patient does not have concerns regarding his medicines.  The following changes were made today:  Stop amlodipine, increase lisinopril,  stop amiodarone, start 12.5 mg of Toprol-XL  Labs/ tests ordered today include:  No orders of the defined types were placed in this encounter.    Disposition:   FU with Will Camnitz 3 months  Signed, Will Jorja Loa, MD  08/30/2015 10:18 AM     [facility] [address] [place] [address] [phone] (office) [phone] (fax)

## 2015-08-30 ENCOUNTER — Encounter: Payer: Self-pay | Admitting: Cardiology

## 2015-08-30 ENCOUNTER — Encounter: Payer: Self-pay | Admitting: *Deleted

## 2015-08-30 ENCOUNTER — Ambulatory Visit (INDEPENDENT_AMBULATORY_CARE_PROVIDER_SITE_OTHER): Payer: Self-pay | Admitting: Cardiology

## 2015-08-30 VITALS — BP 116/72 | HR 57 | Ht 74.0 in | Wt 335.8 lb

## 2015-08-30 DIAGNOSIS — Z01812 Encounter for preprocedural laboratory examination: Secondary | ICD-10-CM

## 2015-08-30 DIAGNOSIS — I493 Ventricular premature depolarization: Secondary | ICD-10-CM

## 2015-08-30 LAB — CBC WITH DIFFERENTIAL/PLATELET
BASOS ABS: 79 {cells}/uL (ref 0–200)
BASOS PCT: 1 %
EOS ABS: 158 {cells}/uL (ref 15–500)
Eosinophils Relative: 2 %
HCT: 39.2 % (ref 38.5–50.0)
HEMOGLOBIN: 12.9 g/dL — AB (ref 13.2–17.1)
LYMPHS ABS: 2133 {cells}/uL (ref 850–3900)
Lymphocytes Relative: 27 %
MCH: 29.7 pg (ref 27.0–33.0)
MCHC: 32.9 g/dL (ref 32.0–36.0)
MCV: 90.1 fL (ref 80.0–100.0)
MONO ABS: 632 {cells}/uL (ref 200–950)
MPV: 10.5 fL (ref 7.5–12.5)
Monocytes Relative: 8 %
NEUTROS ABS: 4898 {cells}/uL (ref 1500–7800)
Neutrophils Relative %: 62 %
PLATELETS: 249 10*3/uL (ref 140–400)
RBC: 4.35 MIL/uL (ref 4.20–5.80)
RDW: 13 % (ref 11.0–15.0)
WBC: 7.9 10*3/uL (ref 3.8–10.8)

## 2015-08-30 LAB — BASIC METABOLIC PANEL
BUN: 24 mg/dL (ref 7–25)
CALCIUM: 9 mg/dL (ref 8.6–10.3)
CHLORIDE: 105 mmol/L (ref 98–110)
CO2: 23 mmol/L (ref 20–31)
CREATININE: 1.18 mg/dL (ref 0.60–1.35)
GLUCOSE: 219 mg/dL — AB (ref 65–99)
Potassium: 4.7 mmol/L (ref 3.5–5.3)
Sodium: 139 mmol/L (ref 135–146)

## 2015-08-30 MED ORDER — LISINOPRIL 20 MG PO TABS: 20.0000 mg | ORAL_TABLET | Freq: Every day | ORAL | 11 refills | Status: DC

## 2015-08-30 MED ORDER — METOPROLOL SUCCINATE ER 25 MG PO TB24: 12.5000 mg | ORAL_TABLET | Freq: Every day | ORAL | 3 refills | Status: DC

## 2015-08-30 NOTE — Patient Instructions (Addendum)
Medication Instructions:   Your physician has recommended you make the following change in your medication:   1) STOP Norvasc  2) INCREASE Lisinopril to 20 mg daily  3) START Toprol XL 12.5 mg once daily  4) STOP Amidorone  --- If you need a refill on your cardiac medications before your next appointment, please call your pharmacy. ---  Labwork:  Your physician recommends that you return for lab work within 2 weeks prior to ablation procedure on 09/12/15.  Testing/Procedures: Your physician has recommended that you have a PVC ablation. Catheter ablation is a medical procedure used to treat some cardiac arrhythmias (irregular heartbeats). During catheter ablation, a long, thin, flexible tube is put into a blood vessel in your groin (upper thigh), or neck. This tube is called an ablation catheter. It is then guided to your heart through the blood vessel. Radio frequency waves destroy small areas of heart tissue where abnormal heartbeats may cause an arrhythmia to start. Please see the instruction sheet given to you today.  Follow-Up:  Your physician recommends that you schedule a follow-up appointment in: 4 weeks, after your procedure on 09/12/15, with Dr. Elberta Fortis.    Make sure to call and reschedule your sleep study.   Thank you for choosing [facility]!!   Dory Horn, RN [phone]   Any Other Special Instructions Will Be Listed Below (If Applicable).  Cardiac Ablation Cardiac ablation is a procedure to disable a small amount of heart tissue in very specific places. The heart has many electrical connections. Sometimes these connections are abnormal and can cause the heart to beat very fast or irregularly. By disabling some of the problem areas, heart rhythm can be improved or made normal. Ablation is done for people who:   Have Wolff-Parkinson-White syndrome.   Have other fast heart rhythms (tachycardia).   Have taken medicines for an abnormal heart rhythm  (arrhythmia) that resulted in:   No success.   Side effects.   May have a high-risk heartbeat that could result in death.  LET [facility] CARE PROVIDER KNOW ABOUT:   Any allergies you have or any previous reactions you have had to X-ray dye, food (such as seafood), medicine, or tape.   All medicines you are taking, including vitamins, herbs, eye drops, creams, and over-the-counter medicines.   Previous problems you or members of your family have had with the use of anesthetics.   Any blood disorders you have.   Previous surgeries or procedures (such as a kidney transplant) you have had.   Medical conditions you have (such as kidney failure).  RISKS AND COMPLICATIONS Generally, cardiac ablation is a safe procedure. However, problems can occur and include:   Increased risk of cancer. Depending on how long it takes to do the ablation, the dose of radiation can be high.  Bruising and bleeding where a thin, flexible tube (catheter) was inserted during the procedure.   Bleeding into the chest, especially into the sac that surrounds the heart (serious).  Need for a permanent pacemaker if the normal electrical system is damaged.   The procedure may not be fully effective, and this may not be recognized for months. Repeat ablation procedures are sometimes required. BEFORE THE PROCEDURE   Follow any instructions from your health care provider regarding eating and drinking before the procedure.   Take your medicines as directed at regular times with water, unless instructed otherwise by your health care provider. If you are taking diabetes medicine, including insulin, ask how you  are to take it and if there are any special instructions you should follow. It is common to adjust insulin dosing the day of the ablation.  PROCEDURE  An ablation is usually performed in a catheterization laboratory with the guidance of fluoroscopy. Fluoroscopy is a type of X-ray that helps your  health care provider see images of your heart during the procedure.   An ablation is a minimally invasive procedure. This means a small cut (incision) is made in either your neck or groin. Your health care provider will decide where to make the incision based on your medical history and physical exam.  An IV tube will be started before the procedure begins. You will be given an anesthetic or medicine to help you relax (sedative).  The skin on your neck or groin will be numbed. A needle will be inserted into a large vein in your neck or groin and catheters will be threaded to your heart.  A special dye that shows up on fluoroscopy pictures may be injected through the catheter. The dye helps your health care provider see the area of the heart that needs treatment.  The catheter has electrodes on the tip. When the area of heart tissue that is causing the arrhythmia is found, the catheter tip will send an electrical current to the area and "scar" the tissue. Three types of energy can be used to ablate the heart tissue:   Heat (radiofrequency energy).   Laser energy.   Extreme cold (cryoablation).   When the area of the heart has been ablated, the catheter will be taken out. Pressure will be held on the insertion site. This will help the insertion site clot and keep it from bleeding. A bandage will be placed on the insertion site.  AFTER THE PROCEDURE   After the procedure, you will be taken to a recovery area where your vital signs (blood pressure, heart rate, and breathing) will be monitored. The insertion site will also be monitored for bleeding.   You will need to lie still for 4-6 hours. This is to ensure you do not bleed from the catheter insertion site.    This information is not intended to replace advice given to you by your health care provider. Make sure you discuss any questions you have with your health care provider.   Document Released: 06/09/2008 Document Revised:  02/11/2014 Document Reviewed: 06/15/2012 Elsevier Interactive Patient Education [employer]

## 2015-08-30 NOTE — Addendum Note (Signed)
Addended by: Baird Lyons on: 08/30/2015 11:22 AM   Modules accepted: Orders

## 2015-08-30 NOTE — Addendum Note (Signed)
Addended by: Baird Lyons on: 08/30/2015 11:14 AM   Modules accepted: Orders

## 2015-08-30 NOTE — Addendum Note (Signed)
Addended by: Baird Lyons on: 08/30/2015 11:01 AM   Modules accepted: Orders

## 2015-09-12 ENCOUNTER — Ambulatory Visit (HOSPITAL_COMMUNITY)
Admission: RE | Admit: 2015-09-12 | Discharge: 2015-09-13 | Disposition: A | Discharge status: Home / Self Care | Payer: Self-pay | Source: Ambulatory Visit | Attending: Cardiology | Admitting: Cardiology

## 2015-09-12 ENCOUNTER — Encounter (HOSPITAL_BASED_OUTPATIENT_CLINIC_OR_DEPARTMENT_OTHER): Payer: Self-pay

## 2015-09-12 ENCOUNTER — Encounter (HOSPITAL_COMMUNITY)
Admission: RE | Disposition: A | Discharge status: Home / Self Care | Payer: Self-pay | Source: Ambulatory Visit | Attending: Cardiology

## 2015-09-12 ENCOUNTER — Encounter (HOSPITAL_COMMUNITY): Payer: Self-pay | Admitting: *Deleted

## 2015-09-12 DIAGNOSIS — E119 Type 2 diabetes mellitus without complications: Secondary | ICD-10-CM | POA: Insufficient documentation

## 2015-09-12 DIAGNOSIS — Z7984 Long term (current) use of oral hypoglycemic drugs: Secondary | ICD-10-CM | POA: Insufficient documentation

## 2015-09-12 DIAGNOSIS — I493 Ventricular premature depolarization: Secondary | ICD-10-CM

## 2015-09-12 DIAGNOSIS — I429 Cardiomyopathy, unspecified: Secondary | ICD-10-CM | POA: Insufficient documentation

## 2015-09-12 DIAGNOSIS — E669 Obesity, unspecified: Secondary | ICD-10-CM | POA: Insufficient documentation

## 2015-09-12 DIAGNOSIS — Z7982 Long term (current) use of aspirin: Secondary | ICD-10-CM | POA: Insufficient documentation

## 2015-09-12 DIAGNOSIS — E785 Hyperlipidemia, unspecified: Secondary | ICD-10-CM | POA: Insufficient documentation

## 2015-09-12 DIAGNOSIS — I509 Heart failure, unspecified: Secondary | ICD-10-CM | POA: Insufficient documentation

## 2015-09-12 HISTORY — DX: Low back pain: M54.5

## 2015-09-12 HISTORY — PX: AV NODE ABLATION: SHX1209

## 2015-09-12 HISTORY — PX: ELECTROPHYSIOLOGIC STUDY: SHX172A

## 2015-09-12 HISTORY — DX: Low back pain, unspecified: M54.50

## 2015-09-12 HISTORY — DX: Other chronic pain: G89.29

## 2015-09-12 HISTORY — DX: Type 2 diabetes mellitus without complications: E11.9

## 2015-09-12 HISTORY — DX: Unspecified osteoarthritis, unspecified site: M19.90

## 2015-09-12 LAB — GLUCOSE, CAPILLARY
GLUCOSE-CAPILLARY: 204 mg/dL — AB (ref 65–99)
Glucose-Capillary: 120 mg/dL — ABNORMAL HIGH (ref 65–99)
Glucose-Capillary: 274 mg/dL — ABNORMAL HIGH (ref 65–99)

## 2015-09-12 LAB — POCT ACTIVATED CLOTTING TIME
ACTIVATED CLOTTING TIME: 290 s
ACTIVATED CLOTTING TIME: 219 s
ACTIVATED CLOTTING TIME: 147 s
Activated Clotting Time: 224 seconds
Activated Clotting Time: 290 seconds

## 2015-09-12 SURGERY — AV NODE ABLATION

## 2015-09-12 MED ORDER — FENTANYL CITRATE (PF) 100 MCG/2ML IJ SOLN
INTRAMUSCULAR | Status: AC
  Filled 2015-09-12: qty 2

## 2015-09-12 MED ORDER — BUPIVACAINE HCL (PF) 0.25 % IJ SOLN
INTRAMUSCULAR | Status: DC | PRN
  Administered 2015-09-12: 30 mL

## 2015-09-12 MED ORDER — HEPARIN (PORCINE) IN NACL 2-0.9 UNIT/ML-% IJ SOLN
INTRAMUSCULAR | Status: DC | PRN
  Administered 2015-09-12: 15:00:00

## 2015-09-12 MED ORDER — SODIUM CHLORIDE 0.9% FLUSH
3.0000 mL | Freq: Two times a day (BID) | INTRAVENOUS | Status: DC
  Administered 2015-09-12: 3 mL via INTRAVENOUS

## 2015-09-12 MED ORDER — MIDAZOLAM HCL 5 MG/5ML IJ SOLN
INTRAMUSCULAR | Status: AC
  Filled 2015-09-12: qty 5

## 2015-09-12 MED ORDER — ATORVASTATIN CALCIUM 40 MG PO TABS
40.0000 mg | ORAL_TABLET | Freq: Every day | ORAL | Status: DC
  Administered 2015-09-12: 40 mg via ORAL
  Filled 2015-09-12: qty 1

## 2015-09-12 MED ORDER — FENTANYL CITRATE (PF) 100 MCG/2ML IJ SOLN
INTRAMUSCULAR | Status: DC | PRN
  Administered 2015-09-12: 25 ug via INTRAVENOUS
  Administered 2015-09-12: 50 ug via INTRAVENOUS
  Administered 2015-09-12 (×9): 25 ug via INTRAVENOUS
  Administered 2015-09-12: 50 ug via INTRAVENOUS
  Administered 2015-09-12: 25 ug via INTRAVENOUS

## 2015-09-12 MED ORDER — ASPIRIN EC 81 MG PO TBEC
81.0000 mg | DELAYED_RELEASE_TABLET | Freq: Every day | ORAL | Status: DC
  Administered 2015-09-12 – 2015-09-13 (×2): 81 mg via ORAL
  Filled 2015-09-12 (×2): qty 1

## 2015-09-12 MED ORDER — HEPARIN SODIUM (PORCINE) 1000 UNIT/ML IJ SOLN
INTRAMUSCULAR | Status: AC
  Filled 2015-09-12: qty 1

## 2015-09-12 MED ORDER — ACETAMINOPHEN 325 MG PO TABS
650.0000 mg | ORAL_TABLET | ORAL | Status: DC | PRN
  Administered 2015-09-13: 01:00:00 650 mg via ORAL
  Filled 2015-09-12: qty 2

## 2015-09-12 MED ORDER — ISOPROTERENOL HCL 0.2 MG/ML IJ SOLN
INTRAMUSCULAR | Status: AC
  Filled 2015-09-12: qty 5

## 2015-09-12 MED ORDER — NITROGLYCERIN 0.4 MG SL SUBL: 0.4000 mg | SUBLINGUAL_TABLET | SUBLINGUAL | Status: DC | PRN

## 2015-09-12 MED ORDER — PROTAMINE SULFATE 10 MG/ML IV SOLN
INTRAVENOUS | Status: AC
  Filled 2015-09-12: qty 5

## 2015-09-12 MED ORDER — ONDANSETRON HCL 4 MG/2ML IJ SOLN: 4.0000 mg | Freq: Four times a day (QID) | INTRAMUSCULAR | Status: DC | PRN

## 2015-09-12 MED ORDER — ISOPROTERENOL HCL 0.2 MG/ML IJ SOLN
INTRAMUSCULAR | Status: DC | PRN
  Administered 2015-09-12: 2 ug/min via INTRAVENOUS

## 2015-09-12 MED ORDER — METFORMIN HCL 500 MG PO TABS: 1000.0000 mg | ORAL_TABLET | Freq: Every day | ORAL | Status: DC

## 2015-09-12 MED ORDER — BUPIVACAINE HCL (PF) 0.25 % IJ SOLN
INTRAMUSCULAR | Status: AC
  Filled 2015-09-12: qty 60

## 2015-09-12 MED ORDER — LISINOPRIL 10 MG PO TABS
20.0000 mg | ORAL_TABLET | Freq: Every day | ORAL | Status: DC
  Administered 2015-09-12 – 2015-09-13 (×2): 20 mg via ORAL
  Filled 2015-09-12 (×2): qty 2

## 2015-09-12 MED ORDER — HEPARIN (PORCINE) IN NACL 2-0.9 UNIT/ML-% IJ SOLN
INTRAMUSCULAR | Status: AC
  Filled 2015-09-12: qty 500

## 2015-09-12 MED ORDER — HEPARIN SODIUM (PORCINE) 1000 UNIT/ML IJ SOLN
INTRAMUSCULAR | Status: DC | PRN
  Administered 2015-09-12: 1000 [IU] via INTRAVENOUS
  Administered 2015-09-12: 10000 [IU] via INTRAVENOUS
  Administered 2015-09-12: 15000 [IU] via INTRAVENOUS
  Administered 2015-09-12: 5000 [IU] via INTRAVENOUS
  Administered 2015-09-12: 4000 [IU] via INTRAVENOUS

## 2015-09-12 MED ORDER — SODIUM CHLORIDE 0.9% FLUSH: 3.0000 mL | INTRAVENOUS | Status: DC | PRN

## 2015-09-12 MED ORDER — MIDAZOLAM HCL 5 MG/5ML IJ SOLN
INTRAMUSCULAR | Status: DC | PRN
  Administered 2015-09-12 (×7): 1 mg via INTRAVENOUS
  Administered 2015-09-12: 2 mg via INTRAVENOUS
  Administered 2015-09-12: 1 mg via INTRAVENOUS
  Administered 2015-09-12: 2 mg via INTRAVENOUS
  Administered 2015-09-12 (×2): 1 mg via INTRAVENOUS

## 2015-09-12 MED ORDER — OMEGA-3-ACID ETHYL ESTERS 1 G PO CAPS
2.0000 g | ORAL_CAPSULE | Freq: Two times a day (BID) | ORAL | Status: DC
  Administered 2015-09-13 (×2): 2 g via ORAL
  Filled 2015-09-12 (×2): qty 2

## 2015-09-12 MED ORDER — PROTAMINE SULFATE 10 MG/ML IV SOLN
50.0000 mg | Freq: Once | INTRAVENOUS | Status: AC
  Administered 2015-09-12: 30 mg via INTRAVENOUS

## 2015-09-12 MED ORDER — METOPROLOL SUCCINATE ER 25 MG PO TB24
12.5000 mg | ORAL_TABLET | Freq: Every day | ORAL | Status: DC
  Administered 2015-09-12 – 2015-09-13 (×2): 12.5 mg via ORAL
  Filled 2015-09-12 (×2): qty 1

## 2015-09-12 MED ORDER — SODIUM CHLORIDE 0.9 % IV SOLN: 250.0000 mL | INTRAVENOUS | Status: DC | PRN

## 2015-09-12 MED ORDER — OXYCODONE-ACETAMINOPHEN 5-325 MG PO TABS
1.0000 | ORAL_TABLET | Freq: Four times a day (QID) | ORAL | Status: DC | PRN
  Administered 2015-09-12: 17:00:00 1 via ORAL
  Filled 2015-09-12: qty 1

## 2015-09-12 MED ORDER — FUROSEMIDE 40 MG PO TABS: 40.0000 mg | ORAL_TABLET | Freq: Every day | ORAL | Status: DC | PRN

## 2015-09-12 SURGICAL SUPPLY — 17 items
BAG SNAP BAND KOVER 36X36 (MISCELLANEOUS) ×3 IMPLANT
CATH JOSEPHSON QUAD-ALLRED 6FR (CATHETERS) ×3 IMPLANT
CATH NAVISTAR SMARTTOUCH DF (ABLATOR) ×3 IMPLANT
CATH SOUNDSTAR 3D IMAGING (CATHETERS) ×3 IMPLANT
INTRO AGILIS NXT STEERABLE LRG (INTRODUCER) ×3
INTRODUCER AGILS NXT STRBL LRG (INTRODUCER) ×1 IMPLANT
NEEDLE TRANSSEPTAL BRK 98CM (NEEDLE) ×3 IMPLANT
PACK EP LATEX FREE (CUSTOM PROCEDURE TRAY) ×2
PACK EP LF (CUSTOM PROCEDURE TRAY) ×1 IMPLANT
PAD DEFIB LIFELINK (PAD) ×3 IMPLANT
PATCH CARTO3 (PAD) ×3 IMPLANT
SHEATH AVANTI 11F 11CM (SHEATH) ×3 IMPLANT
SHEATH PINNACLE 7F 10CM (SHEATH) ×3 IMPLANT
SHEATH PINNACLE 8F 10CM (SHEATH) ×6 IMPLANT
SHEATH PINNACLE 9F 10CM (SHEATH) ×3 IMPLANT
SHIELD RADPAD SCOOP 12X17 (MISCELLANEOUS) ×3 IMPLANT
TUBING SMART ABLATE COOLFLOW (TUBING) ×3 IMPLANT

## 2015-09-12 NOTE — Progress Notes (Signed)
Tele monitor noted with increasing episode of  PVC,, pt denies discomfort, B/P stable,PA Bhagat on call was paged and seen rhythm.  Dr Graciela Husbands was also notified . No new order given. Monitored pt .

## 2015-09-12 NOTE — H&P (Signed)
Matthew Sexton presents to the hospital with a history of CHF and 20% PVCs.  He has symptoms of fatigue and weakness.  On exam, regular rhythm, no murmurs, lungs clear.  Plan for PVC ablation today.  Risks and benefits discussed.  Risks include but are not limited to bleeding, infection, tamponade, pneumothorax.  The patient understands these risks and has agreed tot he procedure.  Will Elberta Fortis, MD 09/12/2015 7:51 AM

## 2015-09-12 NOTE — Progress Notes (Signed)
ACT 145. 61fr/9Fr venous sheaths removed intact from right groin. Manual pressure held x per Andrey Spearman, RN. 79fr venous sheath removed intact from left groin. Manual pressure applied per  Irish Lack, RN. Bilateral sites are at level 0 with no bleeding or hematoma noted. Bilateral  DP pulses +3 pre/post sheath pull. Post activity and precautions explained to patient; verbalized understanding.

## 2015-09-13 ENCOUNTER — Encounter (HOSPITAL_COMMUNITY): Payer: Self-pay | Admitting: Cardiology

## 2015-09-13 LAB — GLUCOSE, CAPILLARY
GLUCOSE-CAPILLARY: 190 mg/dL — AB (ref 65–99)
Glucose-Capillary: 128 mg/dL — ABNORMAL HIGH (ref 65–99)

## 2015-09-13 MED ORDER — OFF THE BEAT BOOK
Freq: Once | Status: AC
  Administered 2015-09-13: 07:00:00
  Filled 2015-09-13: qty 1

## 2015-09-13 NOTE — Care Management Note (Addendum)
Case Management Note  Patient Details  Name: Matthew Sexton MRN: [id number] Date of Birth: 12-Jan-1971  Subjective/Objective:     Patient is s/p ablation, will cont on same home meds, for discharge today, patient with no insurance , may need med ast.  Patient states he has a pcp Dr. Neita Carp, and he will not be on any new medications, he has his meds at home.  NCM informed him of the [facility] clinic and gave him the brochure.  Patient wants to keep his pcp Dr. Neita Carp for now, but says he will keep the information.                 Action/Plan:   Expected Discharge Date:                  Expected Discharge Plan:  Home/Self Care  In-House Referral:     Discharge planning Services  CM Consult  Post Acute Care Choice:    Choice offered to:     DME Arranged:    DME Agency:     HH Arranged:    HH Agency:     Status of Service:  Completed, signed off  If discussed at [employer] of Stay Meetings, dates discussed:    Additional Comments:  Leone Haven, RN 09/13/2015, 8:53 AM

## 2015-09-13 NOTE — Discharge Summary (Signed)
ELECTROPHYSIOLOGY PROCEDURE DISCHARGE SUMMARY    Patient ID: Matthew Sexton,  MRN: [id number], DOB/AGE: 10-May-1970 45 y.o.  Admit date: 09/12/2015 Discharge date: 09/13/2015  Primary Care Physician: [name], MD Primary Cardiologist: Dr. Tresa Endo Electrophysiologist: Dr. Elberta Fortis  Primary Discharge Diagnosis:  1. PVC's 2. NICM  Secondary Discharge Diagnosis:  1. DM 2. HLD 3. obesity  No Known Allergies   Procedures This Admission:  09/12/15: EPS, ablation PVC's, Dr. Elberta Fortis CONCLUSIONS: 1. Sinus rhythm upon presentation.   2. Successful electrical isolation and anatomical encircling of LV PVCs. 4. No inducible arrhythmias following ablation both on and off of Isuprel 5. No early apparent complications.  Brief HPI: Matthew Sexton is a 45 y.o. male was referred to electrophysiology for evaluation of new NICM, CHF, observed to have significant PVC burden thouught to possibly be contributing to his decreased EF in the absence of any significant CAD and planned for EPS/ablation procedure.  Risks, benefits and alternatives discussed with the patient by Dr. Elberta Fortis and the patient wished to proceed.  Hospital Course:  The patient was admitted and underwent EPS/ablation procedure with details noted above.    He was monitored on telemetry overnight which demonstrated SR, occ PVC's.  Procedure sites stable.  Activity restrictions were reviewed with the patient.  The patient was examined by Dr. Elberta Fortis and considered stable for discharge to home, no changes to his current medications, f/u has been arranged.    Physical Exam: Vitals:   09/12/15 2000 09/12/15 2300 09/13/15 0040 09/13/15 0610  BP: 117/66 131/63 (!) 99/44 (!) 105/46  Pulse: (!) 57   (!) 53  Resp: 19 20 18 20   Temp: 98.2 F (36.8 C)   97.7 F (36.5 C)  TempSrc: Oral   Oral  SpO2: 96%  96% 99%  Weight:    (!) 334 lb 14.1 oz (151.9 kg)  Height:        GEN- The patient is well appearing, alert and oriented x 3 today.    HEENT: normocephalic, atraumatic; sclera clear, conjunctiva pink; hearing intact; oropharynx clear; neck supple, no JVP Lungs- Clear to ausculation bilaterally, normal work of breathing.  No wheezes, rales, rhonchi Heart- Regular rate and rhythm, no murmurs, rubs or gallops, PMI not laterally displaced GI- soft, non-tender, non-distended Extremities- no clubbing, cyanosis, or edema; DP/PT/radial pulses 2+ bilaterally MS- no significant deformity or atrophy Skin- warm and dry, no rash or lesion, left chest without hematoma/ecchymosis Psych- euthymic mood, full affect Neuro- no gross deficits   Labs:   Lab Results  Component Value Date   WBC 7.9 08/30/2015   HGB 12.9 (L) 08/30/2015   HCT 39.2 08/30/2015   MCV 90.1 08/30/2015   PLT 249 08/30/2015   No results for input(s): NA, K, CL, CO2, BUN, CREATININE, CALCIUM, PROT, BILITOT, ALKPHOS, ALT, AST, GLUCOSE in the last 168 hours.  Invalid input(s): LABALBU  Discharge Medications:    Medication List    TAKE these medications   aspirin 81 MG EC tablet Take 1 tablet (81 mg total) by mouth daily.   atorvastatin 40 MG tablet Commonly known as:  LIPITOR Take 1 tablet (40 mg total) by mouth daily at 6 PM.   furosemide 40 MG tablet Commonly known as:  LASIX Take 1 tab by mouth as needed for shortness of breath or edema in legs, feet or ankles What changed:  how much to take  how to take this  when to take this  reasons to take this  additional  instructions   lisinopril 20 MG tablet Commonly known as:  PRINIVIL,ZESTRIL Take 1 tablet (20 mg total) by mouth daily.   metFORMIN 1000 MG tablet Commonly known as:  GLUCOPHAGE Take 1 tablet (1,000 mg total) by mouth daily after supper.   metoprolol succinate 25 MG 24 hr tablet Commonly known as:  TOPROL-XL Take 0.5 tablets (12.5 mg total) by mouth daily. Take with or immediately following a meal.   nitroGLYCERIN 0.4 MG SL tablet Commonly known as:  NITROSTAT Place 0.4 mg  under the tongue every 5 (five) minutes as needed for chest pain (3 DOSES MAX).   omega-3 acid ethyl esters 1 g capsule Commonly known as:  LOVAZA Take 2 capsules (2 g total) by mouth 2 (two) times daily.       Disposition:  Home Discharge Instructions    Diet - low sodium heart healthy    Complete by:  As directed   Increase activity slowly    Complete by:  As directed     Follow-up Information    Vartan Kerins Jorja Loa, MD Follow up on 10/11/2015.   Specialty:  Cardiology Why:  11:00AM Contact information: [address] [place] [address] [phone]           Duration of Discharge Encounter: Greater than 30 minutes including physician time.  Norma Fredrickson, PA-C 09/13/2015 8:36 AM   I have seen and examined this patient with Francis Dowse.  Agree with above, note added to reflect my findings.  On exam, regular rhythm,no murmurs, lungs clear.  Had PVC ablation yesterday with resolution of PVCs with ablation.  Had less PVCs overnight.  Tamana Hatfield plan for discharge today with follow up in clinic in 4 weeks.    Ariyan Brisendine M. Elaine Roanhorse MD 09/13/2015 10:30 AM

## 2015-09-13 NOTE — Discharge Instructions (Signed)
No driving for 4 days. No lifting over 5 lbs for 1 week. No vigorous or sexual activity for 1 week. You may return to work on 09/19/15. Keep procedure site clean & dry. If you notice increased pain, swelling, bleeding or pus, call/return!  You may shower, but no soaking baths/hot tubs/pools for 1 week.

## 2015-09-17 ENCOUNTER — Emergency Department (HOSPITAL_COMMUNITY)
Admission: EM | Admit: 2015-09-17 | Discharge: 2015-09-17 | Disposition: A | Discharge status: Home / Self Care | Payer: Self-pay | Attending: Dermatology | Admitting: Dermatology

## 2015-09-17 ENCOUNTER — Inpatient Hospital Stay (HOSPITAL_COMMUNITY)
Admission: EM | Admit: 2015-09-17 | Discharge: 2015-09-21 | DRG: 243 | Disposition: A | Discharge status: Home / Self Care | Payer: Self-pay | Attending: Cardiovascular Disease | Admitting: Cardiovascular Disease

## 2015-09-17 ENCOUNTER — Encounter (HOSPITAL_COMMUNITY): Payer: Self-pay | Admitting: *Deleted

## 2015-09-17 ENCOUNTER — Encounter (HOSPITAL_COMMUNITY): Payer: Self-pay | Admitting: Emergency Medicine

## 2015-09-17 ENCOUNTER — Emergency Department (HOSPITAL_COMMUNITY): Payer: Self-pay

## 2015-09-17 DIAGNOSIS — G4733 Obstructive sleep apnea (adult) (pediatric): Secondary | ICD-10-CM | POA: Diagnosis present

## 2015-09-17 DIAGNOSIS — R002 Palpitations: Secondary | ICD-10-CM | POA: Insufficient documentation

## 2015-09-17 DIAGNOSIS — Z6841 Body Mass Index (BMI) 40.0 and over, adult: Secondary | ICD-10-CM

## 2015-09-17 DIAGNOSIS — E119 Type 2 diabetes mellitus without complications: Secondary | ICD-10-CM | POA: Insufficient documentation

## 2015-09-17 DIAGNOSIS — E669 Obesity, unspecified: Secondary | ICD-10-CM | POA: Diagnosis present

## 2015-09-17 DIAGNOSIS — Z7982 Long term (current) use of aspirin: Secondary | ICD-10-CM | POA: Insufficient documentation

## 2015-09-17 DIAGNOSIS — Z87891 Personal history of nicotine dependence: Secondary | ICD-10-CM

## 2015-09-17 DIAGNOSIS — I5042 Chronic combined systolic (congestive) and diastolic (congestive) heart failure: Secondary | ICD-10-CM | POA: Insufficient documentation

## 2015-09-17 DIAGNOSIS — I493 Ventricular premature depolarization: Secondary | ICD-10-CM | POA: Diagnosis present

## 2015-09-17 DIAGNOSIS — R079 Chest pain, unspecified: Secondary | ICD-10-CM | POA: Diagnosis present

## 2015-09-17 DIAGNOSIS — I25119 Atherosclerotic heart disease of native coronary artery with unspecified angina pectoris: Secondary | ICD-10-CM

## 2015-09-17 DIAGNOSIS — R7989 Other specified abnormal findings of blood chemistry: Secondary | ICD-10-CM

## 2015-09-17 DIAGNOSIS — Z95818 Presence of other cardiac implants and grafts: Secondary | ICD-10-CM

## 2015-09-17 DIAGNOSIS — Z5321 Procedure and treatment not carried out due to patient leaving prior to being seen by health care provider: Secondary | ICD-10-CM | POA: Insufficient documentation

## 2015-09-17 DIAGNOSIS — R778 Other specified abnormalities of plasma proteins: Secondary | ICD-10-CM | POA: Insufficient documentation

## 2015-09-17 DIAGNOSIS — I495 Sick sinus syndrome: Principal | ICD-10-CM | POA: Diagnosis present

## 2015-09-17 DIAGNOSIS — I2510 Atherosclerotic heart disease of native coronary artery without angina pectoris: Secondary | ICD-10-CM | POA: Insufficient documentation

## 2015-09-17 DIAGNOSIS — I429 Cardiomyopathy, unspecified: Secondary | ICD-10-CM

## 2015-09-17 DIAGNOSIS — E785 Hyperlipidemia, unspecified: Secondary | ICD-10-CM | POA: Diagnosis present

## 2015-09-17 DIAGNOSIS — Z7984 Long term (current) use of oral hypoglycemic drugs: Secondary | ICD-10-CM

## 2015-09-17 DIAGNOSIS — I428 Other cardiomyopathies: Secondary | ICD-10-CM | POA: Diagnosis present

## 2015-09-17 HISTORY — DX: Ventricular premature depolarization: I49.3

## 2015-09-17 HISTORY — DX: Atherosclerotic heart disease of native coronary artery without angina pectoris: I25.10

## 2015-09-17 LAB — CBC
HEMATOCRIT: 42.1 % (ref 39.0–52.0)
Hemoglobin: 14.1 g/dL (ref 13.0–17.0)
MCH: 30.1 pg (ref 26.0–34.0)
MCHC: 33.5 g/dL (ref 30.0–36.0)
MCV: 89.8 fL (ref 78.0–100.0)
Platelets: 287 10*3/uL (ref 150–400)
RBC: 4.69 MIL/uL (ref 4.22–5.81)
RDW: 12.7 % (ref 11.5–15.5)
WBC: 9.7 10*3/uL (ref 4.0–10.5)

## 2015-09-17 LAB — BASIC METABOLIC PANEL
ANION GAP: 11 (ref 5–15)
BUN: 30 mg/dL — AB (ref 6–20)
CO2: 23 mmol/L (ref 22–32)
Calcium: 9.9 mg/dL (ref 8.9–10.3)
Chloride: 102 mmol/L (ref 101–111)
Creatinine, Ser: 1.22 mg/dL (ref 0.61–1.24)
Glucose, Bld: 176 mg/dL — ABNORMAL HIGH (ref 65–99)
POTASSIUM: 4.2 mmol/L (ref 3.5–5.1)
SODIUM: 136 mmol/L (ref 135–145)

## 2015-09-17 LAB — I-STAT TROPONIN, ED: Troponin i, poc: 0.12 ng/mL (ref 0.00–0.08)

## 2015-09-17 LAB — TROPONIN I: TROPONIN I: 0.22 ng/mL — AB (ref <0.03)

## 2015-09-17 MED ORDER — ATORVASTATIN CALCIUM 40 MG PO TABS
40.0000 mg | ORAL_TABLET | Freq: Every day | ORAL | Status: DC
  Administered 2015-09-17 – 2015-09-20 (×4): 40 mg via ORAL
  Filled 2015-09-17 (×4): qty 1

## 2015-09-17 MED ORDER — METFORMIN HCL 500 MG PO TABS
1000.0000 mg | ORAL_TABLET | Freq: Every day | ORAL | Status: DC
  Administered 2015-09-17 – 2015-09-20 (×4): 1000 mg via ORAL
  Filled 2015-09-17 (×4): qty 2

## 2015-09-17 MED ORDER — ONDANSETRON HCL 4 MG/2ML IJ SOLN: 4.0000 mg | Freq: Four times a day (QID) | INTRAMUSCULAR | Status: DC | PRN

## 2015-09-17 MED ORDER — LISINOPRIL 10 MG PO TABS
20.0000 mg | ORAL_TABLET | Freq: Every day | ORAL | Status: DC
  Administered 2015-09-18 – 2015-09-21 (×3): 20 mg via ORAL
  Filled 2015-09-17 (×3): qty 2

## 2015-09-17 MED ORDER — ASPIRIN 81 MG PO CHEW
324.0000 mg | CHEWABLE_TABLET | Freq: Once | ORAL | Status: AC
  Administered 2015-09-17: 324 mg via ORAL
  Filled 2015-09-17: qty 4

## 2015-09-17 MED ORDER — METOPROLOL SUCCINATE ER 25 MG PO TB24
12.5000 mg | ORAL_TABLET | Freq: Every day | ORAL | Status: DC
  Filled 2015-09-17: qty 1

## 2015-09-17 MED ORDER — ASPIRIN EC 81 MG PO TBEC
81.0000 mg | DELAYED_RELEASE_TABLET | Freq: Every day | ORAL | Status: DC
  Administered 2015-09-18 – 2015-09-21 (×4): 81 mg via ORAL
  Filled 2015-09-17 (×4): qty 1

## 2015-09-17 MED ORDER — OMEGA-3-ACID ETHYL ESTERS 1 G PO CAPS
2.0000 g | ORAL_CAPSULE | Freq: Two times a day (BID) | ORAL | Status: DC
  Administered 2015-09-18 – 2015-09-21 (×6): 2 g via ORAL
  Filled 2015-09-17 (×6): qty 2

## 2015-09-17 MED ORDER — ACETAMINOPHEN 325 MG PO TABS
650.0000 mg | ORAL_TABLET | ORAL | Status: DC | PRN
  Administered 2015-09-19 – 2015-09-21 (×4): 650 mg via ORAL
  Filled 2015-09-17 (×4): qty 2

## 2015-09-17 MED ORDER — NITROGLYCERIN 0.4 MG SL SUBL: 0.4000 mg | SUBLINGUAL_TABLET | SUBLINGUAL | Status: DC | PRN

## 2015-09-17 MED ORDER — HEPARIN SODIUM (PORCINE) 5000 UNIT/ML IJ SOLN
5000.0000 [IU] | Freq: Three times a day (TID) | INTRAMUSCULAR | Status: DC
  Administered 2015-09-17 – 2015-09-19 (×7): 5000 [IU] via SUBCUTANEOUS
  Filled 2015-09-17 (×7): qty 1

## 2015-09-17 MED ORDER — NITROGLYCERIN 0.4 MG SL SUBL
0.4000 mg | SUBLINGUAL_TABLET | SUBLINGUAL | Status: DC | PRN
  Administered 2015-09-17 (×2): 0.4 mg via SUBLINGUAL
  Filled 2015-09-17: qty 1

## 2015-09-17 NOTE — ED Triage Notes (Signed)
In ED last night for CP, left from waiting room after EKG, CXR, and labs without being seen because symptoms subsided. Had ablation on Tuesday. At 1030 this morning had CP, radiating to left arm and felt palpitations. Took three nitro PTA, no relief.

## 2015-09-17 NOTE — ED Notes (Signed)
Meal tray ordered for patient.

## 2015-09-17 NOTE — ED Notes (Signed)
Pt states he has decided to leave he is feeling better. Pt is tried to get to stay, but he refuses.

## 2015-09-17 NOTE — ED Triage Notes (Signed)
Pt c/o palpitations and shortness starting around 1900 tonight. Pt had an ablation done on Tuesday at [facility]. Pt denies CP, denies n/v. Pt endorses weakness. Since the yesterday. Pt was sitting at his office when the onset occurred.

## 2015-09-17 NOTE — ED Provider Notes (Signed)
[facility]-EMERGENCY DEPT Provider Note   CSN: [id number] Arrival date & time: 09/17/15  1318  First Provider Contact:  None       History   Chief Complaint Chief Complaint  Patient presents with  . Chest Pain  . Palpitations    HPI Matthew Sexton is a 45 y.o. male.  The history is provided by the patient. No language interpreter was used.  Chest Pain   Associated symptoms include palpitations.  Palpitations   Associated symptoms include chest pain.    Matthew Sexton is a 45 y.o. male who presents to the Emergency Department complaining of chest pain.  He has a history of frequent PVCs and had an ablation performed 5 days ago. Yesterday he developed severe shortness of breath and took a Lasix and presented to the emergency department earlier today and had a CXR performed but LWBS. 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 Pain is described as a constant and intense pressure-like sensation. He has associated intermittent diaphoresis and shortness of breath. His pain was partially improved with nitroglycerin 3 prior to ED arrival but it has returned. He denies any fevers, cough. He does have some mild left sided abdominal pain for the last 2 days.  Past Medical History:  Diagnosis Date  . Arthritis    "hands, knees" (09/12/2015)  . Bradycardia   . Chronic combined systolic and diastolic CHF (congestive heart failure) (HCC)   . Chronic lower back pain   . HLD (hyperlipidemia)   . Non-ischemic cardiomyopathy (HCC)    a. Adm 07/2015 with CP - Echo 08/01/15 - EF of 30-35%, global hypokinesis, grade 2 DD. LHC 07/31/15: 35% mRCA, otherwise normal cors, LVEF 35-45% with severely elevated LVEDP - it was suspected his CP was related to his CHF.  Marland Kitchen NSVT (nonsustained ventricular tachycardia) (HCC)   . Obesity   . Snoring   . Suspected sleep apnea    "sleep study scheduled in September" (09/12/2015)  . Type II diabetes mellitus [facility])      Patient Active Problem List   Diagnosis Date Noted  . PVC (premature ventricular contraction) 08/23/2015  . Bradycardia 08/20/2015  . Frequent PVCs 08/17/2015  . NSVT (nonsustained ventricular tachycardia) (HCC) 08/17/2015  . AKI (acute kidney injury) (HCC) 08/16/2015  . NICM (nonischemic cardiomyopathy) (HCC) 08/16/2015  . Chronic combined systolic (congestive) and diastolic (congestive) heart failure (HCC) 08/16/2015  . Numbness of left hand 08/16/2015  . Pain in the chest   . Mixed dyslipidemia 07/31/2015  . DM type 2 with diabetic dyslipidemia (HCC) 07/31/2015  . Unstable angina (HCC) 07/31/2015  . Chest pain 07/30/2015  . Morbid obesity (HCC) 07/30/2015    Past Surgical History:  Procedure Laterality Date  . APPENDECTOMY  1990s  . AV NODE ABLATION  09/12/2015  . CARDIAC CATHETERIZATION N/A 07/31/2015   Procedure: Left Heart Cath and Coronary Angiography;  Surgeon: Marykay Lex, MD;  [facility]: [facility]AS[facility] CV LAB;  Service: Cardiovascular;  Laterality: N/A;  . ELECTROPHYSIOLOGIC STUDY N/A 09/12/2015   Procedure: PVC Ablation;  Surgeon: Will Jorja Loa, MD;  Location: [facility] INVASIVE CV LAB;  Service: Cardiovascular;  Laterality: N/A;       Home Medications    Prior to Admission medications   Medication Sig Start Date End Date Taking? Authorizing Provider  aspirin EC 81 MG EC tablet Take 1 tablet (81 mg total) by mouth daily. 08/03/15  Yes Bhavinkumar Bhagat, PA  atorvastatin (LIPITOR) 40  MG tablet Take 1 tablet (40 mg total) by mouth daily at 6 PM. 08/03/15  Yes Bhavinkumar Bhagat, PA  furosemide (LASIX) 40 MG tablet Take 1 tab by mouth as needed for shortness of breath or edema in legs, feet or ankles Patient taking differently: Take 40 mg by mouth daily as needed for fluid.  08/16/15  Yes Azalee Course, PA  lisinopril (PRINIVIL,ZESTRIL) 20 MG tablet Take 1 tablet (20 mg total) by mouth daily. 08/30/15  Yes Will Jorja Loa, MD  metFORMIN (GLUCOPHAGE) 1000 MG tablet Take 1  tablet (1,000 mg total) by mouth daily after supper. 08/03/15  Yes Bhavinkumar Bhagat, PA  metoprolol succinate (TOPROL-XL) 25 MG 24 hr tablet Take 0.5 tablets (12.5 mg total) by mouth daily. Take with or immediately following a meal. 08/30/15 08/24/16 Yes Will Jorja Loa, MD  nitroGLYCERIN (NITROSTAT) 0.4 MG SL tablet Place 0.4 mg under the tongue every 5 (five) minutes as needed for chest pain (3 DOSES MAX).   Yes Historical Provider, MD  omega-3 acid ethyl esters (LOVAZA) 1 g capsule Take 2 capsules (2 g total) by mouth 2 (two) times daily. 08/03/15  Yes Manson Passey, PA    Family History Family History  Problem Relation Age of Onset  . Heart attack Father 34    Died early 59's   . Coronary artery disease Brother 49    STENTS  . Angina Sister 70    Chronic    Social History Social History  Substance Use Topics  . Smoking status: Former Smoker    Packs/day: 0.75    Years: 5.00    Types: Cigarettes  . Smokeless tobacco: Never Used     Comment: QUIT SMOKING AT AGE 67  . Alcohol use Yes     Comment: 09/12/2015 "none since I was 45 years old"     Allergies   Review of patient's allergies indicates no known allergies.   Review of Systems Review of Systems  Cardiovascular: Positive for chest pain and palpitations.  All other systems reviewed and are negative.    Physical Exam Updated Vital Signs BP 112/62   Pulse (!) 59   Temp 97.4 F (36.3 C) (Oral)   Resp 17   SpO2 98%   Physical Exam  Constitutional: He is oriented to person, place, and time. He appears well-developed and well-nourished.  HENT:  Head: Normocephalic and atraumatic.  Cardiovascular: Normal rate and regular rhythm.   No murmur heard. Pulmonary/Chest: Effort normal and breath sounds normal. No respiratory distress. He exhibits no tenderness.  Abdominal: Soft. There is no tenderness. There is no rebound and no guarding.  Musculoskeletal: He exhibits no edema or tenderness.  Neurological: He  is alert and oriented to person, place, and time.  Skin: Skin is warm and dry.  Psychiatric: He has a normal mood and affect. His behavior is normal.  Nursing note and vitals reviewed.    ED Treatments / Results  Labs (all labs ordered are listed, but only abnormal results are displayed) Labs Reviewed  BASIC METABOLIC PANEL - Abnormal; Notable for the following:       Result Value   Glucose, Bld 176 (*)    BUN 30 (*)    All other components within normal limits  I-STAT TROPOININ, ED - Abnormal; Notable for the following:    Troponin i, poc 0.12 (*)    All other components within normal limits  CBC    EKG  EKG Interpretation None       Radiology  Dg Chest 2 View  Result Date: 09/17/2015 CLINICAL DATA:  Acute onset of palpitations and shortness of breath. Generalized weakness. Initial encounter. EXAM: CHEST  2 VIEW COMPARISON:  Chest radiograph performed 08/16/2015 FINDINGS: The lungs are well-aerated. Mild peribronchial thickening is noted. There is no evidence of focal opacification, pleural effusion or pneumothorax. The heart is normal in size; the mediastinal contour is within normal limits. No acute osseous abnormalities are seen. IMPRESSION: Mild peribronchial thickening noted.  Lungs remain otherwise clear. Electronically Signed   By: Roanna Raider M.D.   On: 09/17/2015 03:34    Procedures Procedures (including critical care time)  Medications Ordered in ED Medications  nitroGLYCERIN (NITROSTAT) SL tablet 0.4 mg (0.4 mg Sublingual Given 09/17/15 1544)  aspirin chewable tablet 324 mg (324 mg Oral Given 09/17/15 1531)     Initial Impression / Assessment and Plan / ED Course  I have reviewed the triage vital signs and the nursing notes.  Pertinent labs & imaging results that were available during my care of the patient were reviewed by me and considered in my medical decision making (see chart for details).  Clinical Course    Patient with history of recent  cardiac ablation here with chest pain, diaphoresis, shortness of breath. His chest pain completely resolved following 2 nitroglycerin in the emergency department. EKG with no acute ischemic changes. Chest x-ray from earlier today was reviewed with no acute changes. Troponin is mildly elevated, this is of unclear significance given recent ablation. Cardiology consulted for further recommendations.  Final Clinical Impressions(s) / ED Diagnoses   Final diagnoses:  Chest pain, unspecified chest pain type    New Prescriptions New Prescriptions   No medications on file     Tilden Fossa, MD 09/17/15 [phone]

## 2015-09-17 NOTE — H&P (Signed)
History & Physical    Patient ID: TEIGAN SAHLI MRN: [id number], DOB/AGE: [birth year]   Admit date: 09/17/2015   Primary Physician: [name], MD Primary Cardiologist: Bishop Limbo, MD / Carleene Mains, MD   Patient Profile    45 y/o ? with a h/o NICM and PVC's s/p PVC ablation, who presented to the ED today with chest pain.  Past Medical History    Past Medical History:  Diagnosis Date  . Arthritis    "hands, knees" (09/12/2015)  . Bradycardia   . Chronic combined systolic and diastolic CHF (congestive heart failure) (HCC)    A. 07/2015 Echo: EF 30-35%, diff HK, gr2 DD.  Marland Kitchen Chronic lower back pain   . HLD (hyperlipidemia)   . Non-ischemic cardiomyopathy (HCC)    a. Adm 07/2015 with CP - Echo 08/01/15 - EF of 30-35%, global hypokinesis, grade 2 DD;  b. LHC 07/31/15: 35% mRCA, otherwise normal cors, LVEF 35-45% with severely elevated LVEDP - it was suspected his CP was related to his CHF.  . Non-obstructive CAD (coronary artery disease)    a. 07/2015 Cath: RCA 35%, otw nl cors.  Marland Kitchen NSVT (nonsustained ventricular tachycardia) (HCC)   . Obesity   . PVC's (premature ventricular contractions)    a. 08/2015 48hr holter: PVC's accounted for 19.1% of beats - right bundle superior axis likely coming from psterior LV around MV; b. 09/2015 s/p PVC RFCA (transeptal). No longer on Amio.  Marland Kitchen Snoring   . Suspected sleep apnea    "sleep study scheduled in September" (09/12/2015)  . Type II diabetes mellitus (HCC)     Past Surgical History:  Procedure Laterality Date  . APPENDECTOMY  1990s  . AV NODE ABLATION  09/12/2015  . CARDIAC CATHETERIZATION N/A 07/31/2015   Procedure: Left Heart Cath and Coronary Angiography;  Surgeon: Marykay Lex, MD;  Location: [facility] INVASIVE CV LAB;  Service: Cardiovascular;  Laterality: N/A;  . ELECTROPHYSIOLOGIC STUDY N/A 09/12/2015   Procedure: PVC Ablation;  Surgeon: Will Jorja Loa, MD;  Location: [facility] INVASIVE CV LAB;  Service: Cardiovascular;  Laterality: N/A;      Allergies  No Known Allergies  History of Present Illness    45 y/o ? with a h/o DM, HL, OSA, and obesity who was recently admitted in 07/2015 with chest pain and mild troponin elevation.  Echo showed LV dysfxn with an EF of 30-35% and global HK, grade 2 DD.  Cath revealed nonobs RCA dzs and elevated filling pressures.  He was noted to have bradycardia during hospitalization, thus limiting his  blocker dose.  He was readmitted 7/17 with chest pain and frequent PVC's.  He was bradycardic with rates in the 30's.  Secondary to bradycardia,  blocker was d/c'd and he was placed on amiodarone.  He was subsequently d/c'd, and wore a 48 hr holter monitor that showed PVC's accounting for 19.1% of all beats.  He f/u with Dr. Elberta Fortis and decision was made to pursue RFCA.  Amio was d/c'd and he underwent successful PVC ablation on 8/8.  There were no immediate complications.  Since then, he has had some fatigue and has continued to note intermittent palpitations.  On the evening of 8/12, he developed fluttering in his chest and general unease, prompting him to present to the ED overnight.  Once here, symptoms resolved and he decided to leave.  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  This persisted despite sl NTG, and he decided to present to the ED.  Here, he was given additional ntg with some improvement in symptoms and we've been asked to eval.  Trop is mildly elevated @ 0.12.  CXR without cardiomegaly or chf.  ECG non-acute.  Frequent pvc's on tele.  Home Medications    Prior to Admission medications   Medication Sig Start Date End Date Taking? Authorizing Provider  aspirin EC 81 MG EC tablet Take 1 tablet (81 mg total) by mouth daily. 08/03/15  Yes Bhavinkumar Bhagat, PA  atorvastatin (LIPITOR) 40 MG tablet Take 1 tablet (40 mg total) by mouth daily at 6 PM. 08/03/15  Yes Bhavinkumar  Bhagat, PA  furosemide (LASIX) 40 MG tablet Take 1 tab by mouth as needed for shortness of breath or edema in legs, feet or ankles Patient taking differently: Take 40 mg by mouth daily as needed for fluid.  08/16/15  Yes Azalee Course, PA  lisinopril (PRINIVIL,ZESTRIL) 20 MG tablet Take 1 tablet (20 mg total) by mouth daily. 08/30/15  Yes Will Jorja Loa, MD  metFORMIN (GLUCOPHAGE) 1000 MG tablet Take 1 tablet (1,000 mg total) by mouth daily after supper. 08/03/15  Yes Bhavinkumar Bhagat, PA  metoprolol succinate (TOPROL-XL) 25 MG 24 hr tablet Take 0.5 tablets (12.5 mg total) by mouth daily. Take with or immediately following a meal. 08/30/15 08/24/16 Yes Will Jorja Loa, MD  nitroGLYCERIN (NITROSTAT) 0.4 MG SL tablet Place 0.4 mg under the tongue every 5 (five) minutes as needed for chest pain (3 DOSES MAX).   Yes Historical Provider, MD  omega-3 acid ethyl esters (LOVAZA) 1 g capsule Take 2 capsules (2 g total) by mouth 2 (two) times daily. 08/03/15  Yes Manson Passey, PA    Family History    Family History  Problem Relation Age of Onset  . Heart attack Father 93    Died early 11's   . Coronary artery disease Brother 61    STENTS  . Angina Sister 108    Chronic    Social History    Social History   Social History  . Marital status: Married    Spouse name: N/A  . Number of children: N/A  . Years of education: N/A   Occupational History  . Pastor    Social History Main Topics  . Smoking status: Former Smoker    Packs/day: 0.75    Years: 5.00    Types: Cigarettes  . Smokeless tobacco: Never Used     Comment: QUIT SMOKING AT AGE 14  . Alcohol use Yes     Comment: 09/12/2015 "none since I was 45 years old"  . Drug use: No  . Sexual activity: Yes   Other Topics Concern  . Not on file   Social History Narrative  . No narrative on file     Review of Systems    General:  +++ fatigue/malaise, no chills, fever, night sweats or weight changes.  Cardiovascular:  +++ chest  pain, no dyspnea on exertion, edema, orthopnea, palpitations, paroxysmal nocturnal dyspnea. Dermatological: No rash, lesions/masses Respiratory: No cough, dyspnea Urologic: No hematuria, dysuria Abdominal:   No nausea, vomiting, diarrhea, bright red blood per rectum, melena, or hematemesis Neurologic:  No visual changes, wkns, changes in mental status. All other systems reviewed and are otherwise negative except as noted above.  Physical Exam    Blood pressure 112/62, pulse (!) 59, temperature 97.4 F (36.3 C), temperature source Oral, resp. rate 17,  SpO2 98 %.  General: Pleasant, NAD Psych: Normal affect. Neuro: Alert and oriented X 3. Moves all extremities spontaneously. HEENT: Normal  Neck: Supple without bruits or JVD. Lungs:  Resp regular and unlabored, CTA. Heart: RRR no s3, s4, or murmurs. Abdomen: Soft, non-tender, non-distended, BS + x 4.  Extremities: No clubbing, cyanosis or edema. DP/PT/Radials 2+ and equal bilaterally.  Labs    Troponin [facility] of Care Test)  Recent Labs  09/17/15 1346  TROPIPOC 0.12*   Lab Results  Component Value Date   WBC 9.7 09/17/2015   HGB 14.1 09/17/2015   HCT 42.1 09/17/2015   MCV 89.8 09/17/2015   PLT 287 09/17/2015    Recent Labs Lab 09/17/15 1335  NA 136  K 4.2  CL 102  CO2 23  BUN 30*  CREATININE 1.22  CALCIUM 9.9  GLUCOSE 176*   Lab Results  Component Value Date   CHOL 104 08/17/2015   HDL 35 (L) 08/17/2015   LDLCALC 49 08/17/2015   TRIG 100 08/17/2015   Radiology Studies    Dg Chest 2 View  Result Date: 09/17/2015 CLINICAL DATA:  Acute onset of palpitations and shortness of breath. Generalized weakness. Initial encounter. EXAM: CHEST  2 VIEW COMPARISON:  Chest radiograph performed 08/16/2015 FINDINGS: The lungs are well-aerated. Mild peribronchial thickening is noted. There is no evidence of focal opacification, pleural effusion or pneumothorax. The heart is normal in size; the mediastinal contour is within  normal limits. No acute osseous abnormalities are seen. IMPRESSION: Mild peribronchial thickening noted.  Lungs remain otherwise clear. Electronically Signed   By: Roanna Raider M.D.   On: 09/17/2015 03:34    ECG & Cardiac Imaging    RSR, 70, PVC's, no acute st/t changes.  Assessment & Plan    1.  Chest Pain:  Pt presented to the ED this AM with left sided chest discomfort associated with sharp and shooting pain into the left axillary area.  Ss are somewhat nitrate responsive.  ECG is nonacute however, trop is mildly elevated @ 0.12 - though he did undergo recent catheter ablation.  No change in symptoms with deep breathing or position changes.  No rub on exam or evidence of cardiomegaly/effusion by cxr.  Recent ischemic eval/cath revealed only a 35% stenosis in the RCA, thus ACS is unlikely.  With recent PVC ablation req transeptal ablation, we will observe and cycle CE, along with repeating an echo.  2.  PVCs:  S/p catheter ablation.  Still having some symptomatic pvc's, he's not sure that he has noted a drop in burden @ home.  He is not currently on  blocker 2/2 bradycardia and amio was previously d/c'd 2/2 pending RFCA.  3.  Sleep Apnea:  Pending outpt sleep eval.  Signed, Nicolasa Ducking, NP 09/17/2015, 4:51 PM  Patient seen and examined. Agree with assessment and plan. 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  Cardiac catheterization in June 2017 showed mild nonobstructive CAD with 35% mid RCA stenoses.  His LVEDP was significantly elevated.  He was initiated on medical therapy for CHF.  The patient also was found to have occasional to frequent PVCs and was suspected of having significant obstructive sleep apnea.  He subsequently has had recurrent issues with PVCs and significant bradycardia leading to discontinuance of beta blocker therapy.  He was tried on  amiodarone therapy and ultimately was seen by Dr. commits  and underwent recent PVC ablation requiring a transseptal technique.  This past week.  The patient continues to note occasional skipped beats.  Experience more palpitations last evening, leading to emergency room presentation and he subsequently went home.  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  He took nitroglycerin with some mild benefit.  He denies pleuritic type symptomatology.  Presently he is in sinus rhythm with occasional unifocal PVC.  His ECG does not reveal ischemic ECG changes.  Cardiac troponin was minimally positive at 0.12.  He will be admitted overnight for observation, with serial troponin and ECG determinations.  A 2-D echo Doppler study will be done in follow-up of his transseptal catheter PVC ablation.    [name], MD, [facility] 09/17/2015 6:04 PM

## 2015-09-18 ENCOUNTER — Observation Stay (HOSPITAL_BASED_OUTPATIENT_CLINIC_OR_DEPARTMENT_OTHER): Payer: Self-pay

## 2015-09-18 DIAGNOSIS — I493 Ventricular premature depolarization: Secondary | ICD-10-CM

## 2015-09-18 DIAGNOSIS — I509 Heart failure, unspecified: Secondary | ICD-10-CM

## 2015-09-18 DIAGNOSIS — R072 Precordial pain: Secondary | ICD-10-CM

## 2015-09-18 LAB — TROPONIN I
TROPONIN I: 0.14 ng/mL — AB (ref <0.03)
Troponin I: 0.19 ng/mL (ref <0.03)

## 2015-09-18 LAB — ECHOCARDIOGRAM COMPLETE
Height: 74 in
Weight: 5267.2 oz

## 2015-09-18 MED ORDER — ISOSORBIDE MONONITRATE ER 30 MG PO TB24
30.0000 mg | ORAL_TABLET | Freq: Every day | ORAL | Status: DC
  Administered 2015-09-18 – 2015-09-21 (×3): 30 mg via ORAL
  Filled 2015-09-18 (×3): qty 1

## 2015-09-18 MED ORDER — METOPROLOL SUCCINATE ER 25 MG PO TB24
25.0000 mg | ORAL_TABLET | Freq: Every day | ORAL | Status: DC
  Administered 2015-09-19: 25 mg via ORAL
  Filled 2015-09-18: qty 1

## 2015-09-18 NOTE — Progress Notes (Signed)
  Echocardiogram 2D Echocardiogram has been performed.  Arvil Chaco 09/18/2015, 4:47 PM

## 2015-09-18 NOTE — Discharge Summary (Signed)
DISCHARGE SUMMARY    Patient ID: Matthew Sexton,  MRN: [id number], DOB/AGE: [birth year] 45 y.o.  Admit date: 09/17/2015 Discharge date: 09/21/15  Primary Care Physician: [name], MD Primary Cardiologist: Dr. Tresa Endo Electrophysiologist: Dr. Elberta Fortis  Primary Discharge Diagnosis:  1. CP 2. Palpitations, PVC's 3. Bradycardia  Secondary Discharge Diagnosis:  1. DM 2. HLD 3. Obesity 4. NICM     Suspect secondary to PVC's  No Known Allergies   Procedures This Admission:  1. 09/20/15: PPM implant, Dr. Elberta Fortis     The patient received MDT dual chamber PPM, Medtronic Advisa DR MRI model A2DR01 (serial number [id number]) pacemaker, with Medtronic model 5076 (serial number [id number]) right atrial lead and a Medtronic model 5076 (serial number [id number]) right ventricular lead      There were no immediate procedural complications      CXR 09/21/15 was negative for pneumothorax   09/18/15: Echocardiogram Study Conclusions - Left ventricle: Wall thickness was increased in a pattern of   moderate LVH. Systolic function was moderately reduced. The   estimated ejection fraction was in the range of 35% to 40%.   Features are consistent with a pseudonormal left ventricular   filling pattern, with concomitant abnormal relaxation and   increased filling pressure (grade 2 diastolic dysfunction). - Left atrium: The atrium was mildly dilated.  Brief HPI Matthew Sexton is a 45 y.o. male with h/o nonischemic cardiomyopathy and PVCs presented to the hospital with weakness, left sided chest pain radiating to the left axilla, and palpitations.  He presented to the ER the night before with 4 hours of palpitations. He presented to 30 and during workup in the emergency room, the palpitations went away and he checked himself out. 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 This discomfort persisted  despite sublingual nitroglycerin. EKG at the time was not acute. Telemetry showed PVCs. He recently had a PVC ablation done 09/12/15, given this was admitted for observation.  Hospital Course:  Today, he denied symptoms of palpitations, chest pain, shortness of breath, orthopnea, PND, lower extremity edema, dizziness, presyncope, syncope, or neurologic sequela. The patient is tolerating medications without difficulties and is otherwise without complaint today. He says that when he is lying flat, he does not have significant palpitations and feels well.  He was ordered for an echocardiogram with plans to discharge if no evidence of procedural complications/pericardial effusion.  The echo did not disclose any pericardial effussion, reviewed and discussed with Dr. Elease Hashimoto (reading the echo).  The patient had abnormal troponins felt likely secondary to his recent ablation procedure with no acute EKG changes and recent cath procedure without obstructive CAD. The patient planned for discharge to home 09/18/15 if his echo remained unchanged and no pericardial effusion post procedure, his echo was stable without pericardial effusion however the patient and his wife felt like he was not ready for discharge without any more definitive intervention into his symptomatology.   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  After much discussion, given his bradycardia with HR dipping to 40's at times limiting the ability to more aggressively treat his PVC burden and cardiomyopathy, it was decided to pursue PPM implantation.  He underwent PPM implant  09/20/15 with Dr. Elberta Fortis, details as outline above.   He was monitored throughout his stay remaining in SB/SR with frequent PVCs.   This morning SR/A pacing, PVC's.  PPM site is stable without hematoma, CXR on  09/21/15 was without pneumothorax,  device check 09/21/15 noted normal device function.  His BB was up-titrated post PPM implant in effort to reduce his PVC burden.  The patient feels well this morning without complaints, reports implant discomfort well controlled with Tylenol.  Activity restrictions, wound care were reviewed with the patient, follow has been arranged.  Out patient evaluation with CHF clinic was also arranged for the patient.  The patient was advised to see his PMD to re-evaluate his DM management, counseled on importance of diet, DM management/control.  The patient was seen and examined by Dr. Elberta Fortis the today and felt stable for discharge.     Physical Exam: Vitals:   09/20/15 1329 09/20/15 1404 09/20/15 2023 09/21/15 0536  BP:  110/67 (!) 109/58 133/77  Pulse:  (!) 55 65 66  Resp:  16 18 20   Temp:  97.7 F (36.5 C) 98.7 F (37.1 C) 97.6 F (36.4 C)  TempSrc:  Oral Oral Oral  SpO2: 96% 95% 98% 99%  Weight:    (!) 330 lb 11.2 oz (150 kg)  Height:       GEN- The patient is well appearing, alert and oriented x 3 today.   Head- normocephalic, atraumatic Eyes-  Sclera clear, conjunctiva pink Ears- hearing intact Oropharynx- clear Neck- supple, no JVP Lungs- Clear to ausculation bilaterally, normal work of breathing Heart- Regular rate and rhythm, extrasystoles are appreciated, no significant murmurs, no rubs or gallops GI- soft, NT, ND Extremities- no clubbing, cyanosis, or edema Skin- no rash or lesion Psych- euthymic mood, full affect Neuro- no gross deficits appreciated  PPM implant site is stable, no hematoma or ecchymosis  Labs:   Lab Results  Component Value Date   WBC 9.7 09/17/2015   HGB 14.1 09/17/2015   HCT 42.1 09/17/2015   MCV 89.8 09/17/2015   PLT 287 09/17/2015     Recent Labs Lab 09/17/15 1335  NA 136  K 4.2  CL 102  CO2 23  BUN 30*  CREATININE 1.22  CALCIUM 9.9  GLUCOSE 176*    Discharge Medications:    Medication List    TAKE  these medications   aspirin 81 MG EC tablet Take 1 tablet (81 mg total) by mouth daily.   atorvastatin 40 MG tablet Commonly known as:  LIPITOR Take 1 tablet (40 mg total) by mouth daily at 6 PM.   furosemide 40 MG tablet Commonly known as:  LASIX Take 1 tablet (40 mg total) by mouth daily as needed for fluid.   isosorbide mononitrate 30 MG 24 hr tablet Commonly known as:  IMDUR Take 1 tablet (30 mg total) by mouth daily.   lisinopril 20 MG tablet Commonly known as:  PRINIVIL,ZESTRIL Take 1 tablet (20 mg total) by mouth daily.   metFORMIN 1000 MG tablet Commonly known as:  GLUCOPHAGE Take 1 tablet (1,000 mg total) by mouth daily after supper.   metoprolol succinate 50 MG 24 hr tablet Commonly known as:  TOPROL-XL Take 1 tablet (50 mg total) by mouth daily. Take with or immediately following a meal. What changed:  medication strength  how much to take   nitroGLYCERIN 0.4 MG SL tablet Commonly known as:  NITROSTAT Place 0.4 mg under the tongue every 5 (five) minutes as needed for  chest pain (3 DOSES MAX).   omega-3 acid ethyl esters 1 g capsule Commonly known as:  LOVAZA Take 2 capsules (2 g total) by mouth 2 (two) times daily.       Disposition: Home Discharge Instructions    Diet - low sodium heart healthy    Complete by:  As directed   Increase activity slowly    Complete by:  As directed     Follow-up Information    [facility] [address] [facility] Follow up on 10/04/2015.   Specialty:  Cardiology Why:  9:30AM, wound check Contact information: [address], [address] [place] [id number] 330 [phone]       Marca Anconaalton McLean, MD Follow up on 10/18/2015.   Specialty:  Cardiology Why:  11:20AM Contact information: [address] [place] [address] [phone]        Kaaliyah Kita Jorja LoaMartin Dosha Broshears, MD Follow up on 12/19/2015.   Specialty:  Cardiology Why:  10:30AM  Contact information: [address] [place] [address] [phone]           Duration of Discharge Encounter: Greater than 30 minutes including physician time.  SignedFrancis Dowse, Renee Ursuy, PA-C 09/21/2015 8:02 AM   I have seen and examined this patient with Francis Dowseenee Ursuy.  Agree with above, note added to reflect my findings.  On exam, regular rhythm, no murmurs, lungs clear.  Admitted for HF and weakness.  Found to have further issues with bradycardia.  TTE done showing an EF of 35-40% and thus had an pacemaker placed for sinus node dysfunction.  Geraldene Eisel plan to increase beta blocker now that his HR is stable.  This may also help with his PVCs.      Nahzir Pohle M. Jaqwan Wieber MD 09/21/2015 2:03 PM

## 2015-09-18 NOTE — Progress Notes (Signed)
Came to discharge the patient as per plan with Dr. Elberta Fortis post echo. The patient expresses his frustration though that while happy the echo showed no procedural complications, feels like nothing has been done to try to prevent recurrent symptoms. The patient's wife expresses concern that the degree of his symptoms  May not be completely understood.   He has not had recurrent CP here, but had what he reports as significant CP as part of his symptoms coming in, and persistent lack of energy and poor exertional capacity.  (these all pre-date his ablation, unchanged pre or post procedure, dating back to June) Discussed with Dr. Elberta Fortis, Baljit Liebert hold is discharge, start Imdur 30mg  tonight, and tomorrow up-titrate his metoprolol.  We may have him see CHF team in consult outpatient  His BP has been stable, Telemetry has been SR, 50-70's, 40's overnight last PM, PVC's, no arrhythmias The patient inquires weather or not he could have a blood clot somewhere, mentions some occ R calf discomfort at rest, he has no calf tenderness, no edema, no skin changes, offered to get LE venous US but he declines.  He denies any SOB outside of exertional DOE, saturating 97-100% on RA.  And again, the symptoms not acute, dating back months. Part of the patient's c/o tonight was that he had been in bed all day and the first time up was to discharge, he is encouraged to ambulate to assess symptoms as well.  Francis Dowse, PA-C  Loman Brooklyn, MD

## 2015-09-18 NOTE — Progress Notes (Signed)
Received call from echo lab requesting administration of definity. Per Francis Dowse (EP PA) do not give definity r/t ablation last week. Made echo lab aware.   Leonidas Romberg, RN

## 2015-09-18 NOTE — Consult Note (Signed)
CARDIOLOGY CONSULT NOTE     Primary Care Physician: [name], MD Referring Physician:  Admit Date: 09/17/2015  Reason for consultation:  Matthew Sexton is a 45 y.o. male with a h/o nonischemic cardiomyopathy and PVCs presented to the hospital with weakness, left sided chest pain radiating to the left axilla, and palpitations.  He presented to the ER the night before with 4 hours of palpitations. He presented to 30 and during workup in the emergency room, the palpitations went away and he checked himself out. 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 This discomfort persisted despite sublingual nitroglycerin. EKG at the time was not acute. Telemetry showed PVCs. He recently had a PVC ablation.  Today, he denies symptoms of palpitations, chest pain, shortness of breath, orthopnea, PND, lower extremity edema, dizziness, presyncope, syncope, or neurologic sequela. The patient is tolerating medications without difficulties and is otherwise without complaint today. He says that when he is lying flat, he does not have significant palpitations and feels well.  Past Medical History:  Diagnosis Date  . Arthritis    "hands, knees" (09/12/2015)  . Bradycardia   . Chronic combined systolic and diastolic CHF (congestive heart failure) (HCC)    A. 07/2015 Echo: EF 30-35%, diff HK, gr2 DD.  Marland Kitchen Chronic lower back pain   . HLD (hyperlipidemia)   . Non-ischemic cardiomyopathy (HCC)    a. Adm 07/2015 with CP - Echo 08/01/15 - EF of 30-35%, global hypokinesis, grade 2 DD;  b. LHC 07/31/15: 35% mRCA, otherwise normal cors, LVEF 35-45% with severely elevated LVEDP - it was suspected his CP was related to his CHF.  . Non-obstructive CAD (coronary artery disease)    a. 07/2015 Cath: RCA 35%, otw nl cors.  Marland Kitchen NSVT (nonsustained ventricular tachycardia) (HCC)   . Obesity   . PVC's (premature ventricular contractions)      a. 08/2015 48hr holter: PVC's accounted for 19.1% of beats - right bundle superior axis likely coming from psterior LV around MV; b. 09/2015 s/p PVC RFCA (transeptal). No longer on Amio.  Marland Kitchen Snoring   . Suspected sleep apnea    "sleep study scheduled in September" (09/12/2015)  . Type II diabetes mellitus (HCC)    Past Surgical History:  Procedure Laterality Date  . APPENDECTOMY  1990s  . AV NODE ABLATION  09/12/2015  . CARDIAC CATHETERIZATION N/A 07/31/2015   Procedure: Left Heart Cath and Coronary Angiography;  Surgeon: Marykay Lex, MD;  Location: [facility] INVASIVE CV LAB;  Service: Cardiovascular;  Laterality: N/A;  . ELECTROPHYSIOLOGIC STUDY N/A 09/12/2015   Procedure: PVC Ablation;  Surgeon: Marri Mcneff Jorja Loa, MD;  Location: [facility] INVASIVE CV LAB;  Service: Cardiovascular;  Laterality: N/A;    . aspirin EC  81 mg Oral Daily  . atorvastatin  40 mg Oral q1800  . heparin  5,000 Units Subcutaneous Q8H  . lisinopril  20 mg Oral Daily  . metFORMIN  1,000 mg Oral QPC supper  . metoprolol succinate  12.5 mg Oral Daily  . omega-3 acid ethyl esters  2 g Oral BID      No Known Allergies  Social History   Social History  . Marital status: Married    Spouse name: N/A  . Number of children: N/A  . Years of education: N/A   Occupational History  . Pastor    Social History Main Topics  . Smoking status: Former Smoker  Packs/day: 0.75    Years: 5.00    Types: Cigarettes  . Smokeless tobacco: Never Used     Comment: QUIT SMOKING AT AGE 33  . Alcohol use Yes     Comment: 09/12/2015 "none since I was 45 years old"  . Drug use: No  . Sexual activity: Yes   Other Topics Concern  . Not on file   Social History Narrative  . No narrative on file    Family History  Problem Relation Age of Onset  . Heart attack Father 1845    Died early 550's   . Coronary artery disease Brother 345    STENTS  . Angina Sister 342    Chronic    ROS- All systems are reviewed and negative except as per  the HPI above  Physical Exam: Telemetry: Vitals:   09/17/15 1845 09/17/15 1904 09/18/15 0100 09/18/15 0358  BP: 111/66 (!) 127/92  113/64  Pulse: (!) 51 (!) 50  (!) 51  Resp: 15 16  20   Temp:  97.8 F (36.6 C)  97.7 F (36.5 C)  TempSrc:  Oral  Oral  SpO2: 96% 100%  97%  Weight:    (!) 329 lb 3.2 oz (149.3 kg)  Height:   6\' 2"  (1.88 m)     GEN- The patient is well appearing, alert and oriented x 3 today.   Head- normocephalic, atraumatic Eyes-  Sclera clear, conjunctiva pink Ears- hearing intact Oropharynx- clear Neck- supple, no JVP Lymph- no cervical lymphadenopathy Lungs- Clear to ausculation bilaterally, normal work of breathing Heart- Regular rate and rhythm, no murmurs, rubs or gallops, PMI not laterally displaced GI- soft, NT, ND, + BS Extremities- no clubbing, cyanosis, or edema MS- no significant deformity or atrophy Skin- no rash or lesion Psych- euthymic mood, full affect Neuro- strength and sensation are intact  EKG:  Labs:   Lab Results  Component Value Date   WBC 9.7 09/17/2015   HGB 14.1 09/17/2015   HCT 42.1 09/17/2015   MCV 89.8 09/17/2015   PLT 287 09/17/2015    Recent Labs Lab 09/17/15 1335  NA 136  K 4.2  CL 102  CO2 23  BUN 30*  CREATININE 1.22  CALCIUM 9.9  GLUCOSE 176*   Lab Results  Component Value Date   TROPONINI 0.19 (HH) 09/18/2015    Lab Results  Component Value Date   CHOL 104 08/17/2015   CHOL 157 07/31/2015   Lab Results  Component Value Date   HDL 35 (L) 08/17/2015   HDL 30 (L) 07/31/2015   Lab Results  Component Value Date   LDLCALC 49 08/17/2015   LDLCALC UNABLE TO CALCULATE IF TRIGLYCERIDE OVER 400 mg/dL 16/10/960406/26/2017   Lab Results  Component Value Date   TRIG 100 08/17/2015   TRIG 415 (H) 07/31/2015   Lab Results  Component Value Date   CHOLHDL 3.0 08/17/2015   CHOLHDL 5.2 07/31/2015   No results found for: LDLDIRECT    Radiology: Mild peribronchial thickening noted.  Lungs remain otherwise  clear.  Echo: - Technically difficult study. Definity contrast given. LVEF   30-35%, global hypokinesis, grade 2 DD with markedly elevated LV   filling pressures, normal LA size, normal IVC.  ASSESSMENT AND PLAN:   1. Chest pain: Unclear at this time the cause of his chest pain. His EKG is nonacute, and it is possible that his troponin elevation is due to his recent ablation. He has no signs or symptoms of tamponade on exam. His  chest x-ray shows no evidence of cardiomegaly.  It is possible that there is some sort of complication from his PVC ablation. We'll get an echocardiogram to make a further determination. Should the echo being normal, Joelyn Lover plan for discharge today.  2. PVCs: Status post catheter ablation. PVCs have returned, although he did have a long. After ablation without PVCs. Unfortunately he does have significant bradycardia which does limit treatment options.  3. Chronic nonischemic cardiomyopathy: Possibly due to high burden of PVCs, and is status post an attempt at [facility] ablation with return of PVCs. I did discuss with him the option of a defibrillator down the road. This may help to both prevent sudden death and allow [place] to start medications for his PVCs.   Neenah Canter Jorja Loa, MD 09/18/2015  7:44 AM

## 2015-09-19 ENCOUNTER — Encounter (HOSPITAL_COMMUNITY): Payer: Self-pay

## 2015-09-19 ENCOUNTER — Observation Stay (HOSPITAL_BASED_OUTPATIENT_CLINIC_OR_DEPARTMENT_OTHER): Payer: Self-pay

## 2015-09-19 DIAGNOSIS — R079 Chest pain, unspecified: Secondary | ICD-10-CM

## 2015-09-19 LAB — ECHOCARDIOGRAM LIMITED
Height: 74 in
Weight: 5316.8 oz

## 2015-09-19 MED ORDER — PERFLUTREN LIPID MICROSPHERE
INTRAVENOUS | Status: AC
  Administered 2015-09-19: 6 mL via INTRAVENOUS
  Filled 2015-09-19: qty 10

## 2015-09-19 MED ORDER — PERFLUTREN LIPID MICROSPHERE
1.0000 mL | INTRAVENOUS | Status: AC | PRN
  Administered 2015-09-19: 6 mL via INTRAVENOUS
  Filled 2015-09-19: qty 10

## 2015-09-19 NOTE — Progress Notes (Signed)
Spoke with Dr. Tresa Endo. Per Dr. Tresa Endo he is okay with pt remaining on home dose of Metformin rather than accuchecks.   Leonidas Romberg, RN

## 2015-09-19 NOTE — Progress Notes (Signed)
  Echocardiogram 2D Echocardiogram Limited with Definity has been performed.  Leta Jungling M 09/19/2015, 3:43 PM

## 2015-09-19 NOTE — Progress Notes (Signed)
Inpatient Diabetes Program Recommendations  AACE/ADA: New Consensus Statement on Inpatient Glycemic Control (2015)  Target Ranges:  Prepandial:   less than 140 mg/dL      Peak postprandial:   less than 180 mg/dL (1-2 hours)      Critically ill patients:  140 - 180 mg/dL   Lab Results  Component Value Date   GLUCAP 190 (H) 09/13/2015   HGBA1C 8.0 (H) 08/17/2015    Review of Glycemic Control  Diabetes history: DM2 Outpatient Diabetes medications: metformin 1000 mg after supper Current orders for Inpatient glycemic control: same as above HgbA1C of 8% indicates suboptimal outpatient control. Needs close f/u with PCP to achieve better glycemic control.   Inpatient Diabetes Program Recommendations:    Add Novolog sensitive tidwc and hs  Would benefit from tighter glucose control at home.    Continue to follow. Thank you. Ailene Ards, RD, LDN, CDE Inpatient Diabetes Coordinator [phone]

## 2015-09-19 NOTE — Progress Notes (Signed)
SUBJECTIVE: The patient is  he denies chest pain, shortness of breath, or any new concerns.  Remains concerned about his overall wellbeing and long term outlook.  Marland Kitchen. aspirin EC  81 mg Oral Daily  . atorvastatin  40 mg Oral q1800  . heparin  5,000 Units Subcutaneous Q8H  . isosorbide mononitrate  30 mg Oral Daily  . lisinopril  20 mg Oral Daily  . metFORMIN  1,000 mg Oral QPC supper  . metoprolol succinate  25 mg Oral Daily  . omega-3 acid ethyl esters  2 g Oral BID      OBJECTIVE: Physical Exam: Vitals:   09/19/15 0620 09/19/15 0955 09/19/15 0957 09/19/15 1213  BP: 114/82 111/61  (!) 116/56  Pulse: (!) 54 (!) 56 71   Resp: 18     Temp: 97.6 F (36.4 C)     TempSrc: Oral     SpO2: 99%     Weight: (!) 332 lb 4.8 oz (150.7 kg)     Height:        Intake/Output Summary (Last 24 hours) at 09/19/15 1308 Last data filed at 09/19/15 [id number]  Gross per 24 hour  Intake              240 ml  Output              950 ml  Net             -710 ml    Telemetry reveals SB/SR, generally 50-70's, bradycardia is brief transient slowing in daytime, PVCs continue  GEN- The patient is well appearing, alert and oriented x 3 today.   Head- normocephalic, atraumatic Eyes-  Sclera clear, conjunctiva pink Ears- hearing intact Oropharynx- clear Neck- supple, no JVP Lungs- Clear to ausculation bilaterally, normal work of breathing Heart- Regular rate and rhythm, extrasystoles are appreciated, no significant murmurs, no rubs or gallops GI- soft, NT, ND Extremities- no clubbing, cyanosis, or edema Skin- no rash or lesion Psych- euthymic mood, full affect Neuro- no gross deficits appreciated  LABS: Basic Metabolic Panel:  Recent Labs  62/13/0808/13/17 1335  NA 136  K 4.2  CL 102  CO2 23  GLUCOSE 176*  BUN 30*  CREATININE 1.22  CALCIUM 9.9   CBC:  Recent Labs  09/17/15 1335  WBC 9.7  HGB 14.1  HCT 42.1  MCV 89.8  PLT 287   Cardiac Enzymes:  Recent Labs  09/17/15 1940  09/18/15 0126 09/18/15 0746  TROPONINI 0.22* 0.19* 0.14*   RADIOLOGY: Dg Chest 2 View Result Date: 09/17/2015 CLINICAL DATA:  Acute onset of palpitations and shortness of breath. Generalized weakness. Initial encounter. EXAM: CHEST  2 VIEW COMPARISON:  Chest radiograph performed 08/16/2015 FINDINGS: The lungs are well-aerated. Mild peribronchial thickening is noted. There is no evidence of focal opacification, pleural effusion or pneumothorax. The heart is normal in size; the mediastinal contour is within normal limits. No acute osseous abnormalities are seen. IMPRESSION: Mild peribronchial thickening noted.  Lungs remain otherwise clear. Electronically Signed   By: Roanna RaiderJeffery  Chang M.D.   On: 09/17/2015 03:34    ASSESSMENT AND PLAN:   1. NICM     Exam is euvolemic     Patient very frustrated about his current exertional capacity     VSS  2. PVC's     S/p PVC ablation 09/12/15 with Dr. Elberta Fortisamnitz      Recurrent PVC's post ablation  3. Bradycardia     This has been limiting the ability to  effectively treat his PVCs as well as his CM     HR has been 50's with brief/transient slowing into 40's at times   Given his bradycardia is limiting the ability to treat his CM and PVC's effectively, after lengthy discussion with the patient and Dr. Elberta Fortis, has been decided to proceed with device implant for tomorrow, Lorimer Tiberio plan for ICD   4. Obesity  5. DM     Pualani Borah ask diabetes RN to evaluate the patient  6. CP     No obstructive CAD by cath only a couple months ago     Mild abnormal troponin felt secondary to recent ablation procedure     Not recurrent since admission     Imdur was added yesterday  Francis Dowse, PA-C 09/19/2015 1:08 PM  I have seen and examined this patient with Francis Dowse.  Agree with above, note added to reflect my findings.  On exam, regular rhythm, no murmurs, lungs clear. Continues to feel weak with bradycardia.  Sedric Guia plan for pacing tomorrow and [place] Zaragoza then be able to better  treat his HF.  Discussed risks and benefits of pacemaker/ICD placement.  Risks include but are not limited to bleeding, tamponade, heart block, and pneumothorax.  The patient understands these risks and has agreed to the procedure.  It appears that his LVEF may have improved and he thus does not qualify for an ICD.  Baudelio Karnes get a limited TTE to further assess for LV function.      Gentry Pilson M. Brizeyda Holtmeyer MD 09/19/2015 1:26 PM

## 2015-09-20 ENCOUNTER — Encounter (HOSPITAL_COMMUNITY): Payer: Self-pay | Admitting: Cardiology

## 2015-09-20 ENCOUNTER — Encounter (HOSPITAL_COMMUNITY)
Admission: EM | Disposition: A | Discharge status: Home / Self Care | Payer: Self-pay | Source: Home / Self Care | Attending: Cardiovascular Disease

## 2015-09-20 DIAGNOSIS — I495 Sick sinus syndrome: Principal | ICD-10-CM

## 2015-09-20 HISTORY — PX: EP IMPLANTABLE DEVICE: SHX172B

## 2015-09-20 SURGERY — PACEMAKER IMPLANT

## 2015-09-20 MED ORDER — MIDAZOLAM HCL 5 MG/5ML IJ SOLN
INTRAMUSCULAR | Status: AC
  Filled 2015-09-20: qty 5

## 2015-09-20 MED ORDER — MIDAZOLAM HCL 5 MG/5ML IJ SOLN
INTRAMUSCULAR | Status: DC | PRN
  Administered 2015-09-20 (×3): 1 mg via INTRAVENOUS
  Administered 2015-09-20: 2 mg via INTRAVENOUS

## 2015-09-20 MED ORDER — LIDOCAINE HCL (PF) 1 % IJ SOLN
INTRAMUSCULAR | Status: DC | PRN
  Administered 2015-09-20: 80 mL via INTRADERMAL

## 2015-09-20 MED ORDER — SODIUM CHLORIDE 0.9% FLUSH: 3.0000 mL | Freq: Two times a day (BID) | INTRAVENOUS | Status: DC

## 2015-09-20 MED ORDER — HEPARIN (PORCINE) IN NACL 2-0.9 UNIT/ML-% IJ SOLN
INTRAMUSCULAR | Status: AC
  Filled 2015-09-20: qty 500

## 2015-09-20 MED ORDER — CEFAZOLIN SODIUM-DEXTROSE 2-4 GM/100ML-% IV SOLN
2.0000 g | Freq: Four times a day (QID) | INTRAVENOUS | Status: AC
  Administered 2015-09-20 – 2015-09-21 (×3): 2 g via INTRAVENOUS
  Filled 2015-09-20 (×4): qty 100

## 2015-09-20 MED ORDER — HEPARIN (PORCINE) IN NACL 2-0.9 UNIT/ML-% IJ SOLN
INTRAMUSCULAR | Status: DC | PRN
  Administered 2015-09-20: 13:00:00

## 2015-09-20 MED ORDER — SODIUM CHLORIDE 0.9 % IR SOLN
Status: AC
  Filled 2015-09-20: qty 2

## 2015-09-20 MED ORDER — LIDOCAINE HCL (PF) 1 % IJ SOLN
INTRAMUSCULAR | Status: AC
  Filled 2015-09-20: qty 30

## 2015-09-20 MED ORDER — ONDANSETRON HCL 4 MG/2ML IJ SOLN: 4.0000 mg | Freq: Four times a day (QID) | INTRAMUSCULAR | Status: DC | PRN

## 2015-09-20 MED ORDER — FENTANYL CITRATE (PF) 100 MCG/2ML IJ SOLN
INTRAMUSCULAR | Status: DC | PRN
  Administered 2015-09-20 (×4): 25 ug via INTRAVENOUS

## 2015-09-20 MED ORDER — SODIUM CHLORIDE 0.9% FLUSH: 3.0000 mL | INTRAVENOUS | Status: DC | PRN

## 2015-09-20 MED ORDER — SODIUM CHLORIDE 0.9 % IV SOLN
INTRAVENOUS | Status: DC
  Administered 2015-09-20: 10:00:00 via INTRAVENOUS

## 2015-09-20 MED ORDER — SODIUM CHLORIDE 0.9 % IR SOLN
80.0000 mg | Status: AC
  Administered 2015-09-20: 80 mg
  Filled 2015-09-20: qty 2

## 2015-09-20 MED ORDER — ACETAMINOPHEN 325 MG PO TABS: 325.0000 mg | ORAL_TABLET | ORAL | Status: DC | PRN

## 2015-09-20 MED ORDER — CHLORHEXIDINE GLUCONATE 4 % EX LIQD
60.0000 mL | Freq: Once | CUTANEOUS | Status: DC
  Filled 2015-09-20: qty 15

## 2015-09-20 MED ORDER — METOPROLOL SUCCINATE ER 100 MG PO TB24: 100.0000 mg | ORAL_TABLET | Freq: Every day | ORAL | Status: DC

## 2015-09-20 MED ORDER — FENTANYL CITRATE (PF) 100 MCG/2ML IJ SOLN
INTRAMUSCULAR | Status: AC
  Filled 2015-09-20: qty 2

## 2015-09-20 MED ORDER — CEFAZOLIN SODIUM 10 G IJ SOLR
3.0000 g | INTRAMUSCULAR | Status: AC
  Administered 2015-09-20: 3 g via INTRAVENOUS
  Filled 2015-09-20 (×2): qty 3000

## 2015-09-20 MED ORDER — SODIUM CHLORIDE 0.9 % IV SOLN: 250.0000 mL | INTRAVENOUS | Status: DC

## 2015-09-20 MED ORDER — METOPROLOL SUCCINATE ER 50 MG PO TB24
50.0000 mg | ORAL_TABLET | Freq: Every day | ORAL | Status: DC
  Administered 2015-09-21: 50 mg via ORAL
  Filled 2015-09-20: qty 1

## 2015-09-20 SURGICAL SUPPLY — 8 items
CABLE SURGICAL S-101-97-12 (CABLE) ×3 IMPLANT
HOVERMATT SINGLE USE (MISCELLANEOUS) ×3 IMPLANT
LEAD CAPSURE NOVUS 5076-52CM (Lead) ×3 IMPLANT
LEAD CAPSURE NOVUS 5076-58CM (Lead) ×3 IMPLANT
PAD DEFIB LIFELINK (PAD) ×3 IMPLANT
PPM ADVISA MRI DR A2DR01 (Pacemaker) ×3 IMPLANT
SHEATH CLASSIC 7F (SHEATH) ×6 IMPLANT
TRAY PACEMAKER INSERTION (PACKS) ×3 IMPLANT

## 2015-09-20 NOTE — Progress Notes (Signed)
SUBJECTIVE: The patient is  he denies chest pain, shortness of breath, or any new concerns.     Matthew Sexton. aspirin EC  81 mg Oral Daily  . atorvastatin  40 mg Oral q1800  .  ceFAZolin (ANCEF) IV  3 g Intravenous To Cath  . chlorhexidine  60 mL Topical Once  . gentamicin irrigation  80 mg Irrigation On Call  . isosorbide mononitrate  30 mg Oral Daily  . lisinopril  20 mg Oral Daily  . metFORMIN  1,000 mg Oral QPC supper  . metoprolol succinate  25 mg Oral Daily  . omega-3 acid ethyl esters  2 g Oral BID  . sodium chloride flush  3 mL Intravenous Q12H   . sodium chloride 50 mL/hr at 09/20/15 0932  . sodium chloride      OBJECTIVE: Physical Exam: Vitals:   09/19/15 1213 09/19/15 1326 09/19/15 1951 09/20/15 0545  BP: (!) 116/56 (!) 110/53 112/75 111/61  Pulse:  (!) 51 (!) 41 (!) 50  Resp:  18 18 18   Temp:  98.2 F (36.8 C) 97.8 F (36.6 C) 98.3 F (36.8 C)  TempSrc:  Oral Oral Oral  SpO2:  94% 98% 96%  Weight:    (!) 332 lb 4.8 oz (150.7 kg)  Height:        Intake/Output Summary (Last 24 hours) at 09/20/15 0934 Last data filed at 09/20/15 0419  Gross per 24 hour  Intake              240 ml  Output             1850 ml  Net            -1610 ml    Telemetry reveals SB/SR, generally 50-70's, rates occ to 40's, PVCs continue at times bigmenal  GEN- The patient is well appearing, alert and oriented x 3 today.   Head- normocephalic, atraumatic Eyes-  Sclera clear, conjunctiva pink Ears- hearing intact Oropharynx- clear Neck- supple, no JVP Lungs- Clear to ausculation bilaterally, normal work of breathing Heart- Regular rate and rhythm, extrasystoles are appreciated, no significant murmurs, no rubs or gallops GI- soft, NT, ND Extremities- no clubbing, cyanosis, or edema Skin- no rash or lesion Psych- euthymic mood, full affect Neuro- no gross deficits appreciated  LABS: Basic Metabolic Panel:  Recent Labs  16/11/9606/13/17 1335  NA 136  K 4.2  CL 102  CO2 23  GLUCOSE  176*  BUN 30*  CREATININE 1.22  CALCIUM 9.9   CBC:  Recent Labs  09/17/15 1335  WBC 9.7  HGB 14.1  HCT 42.1  MCV 89.8  PLT 287   Cardiac Enzymes:  Recent Labs  09/17/15 1940 09/18/15 0126 09/18/15 0746  TROPONINI 0.22* 0.19* 0.14*   RADIOLOGY: Dg Chest 2 View Result Date: 09/17/2015 CLINICAL DATA:  Acute onset of palpitations and shortness of breath. Generalized weakness. Initial encounter. EXAM: CHEST  2 VIEW COMPARISON:  Chest radiograph performed 08/16/2015 FINDINGS: The lungs are well-aerated. Mild peribronchial thickening is noted. There is no evidence of focal opacification, pleural effusion or pneumothorax. The heart is normal in size; the mediastinal contour is within normal limits. No acute osseous abnormalities are seen. IMPRESSION: Mild peribronchial thickening noted.  Lungs remain otherwise clear. Electronically Signed   By: Roanna RaiderJeffery  Chang M.D.   On: 09/17/2015 03:34    ASSESSMENT AND PLAN:   1. NICM     Exam is euvolemic     Patient very frustrated about his current  exertional capacity     VSS  2. PVC's     S/p PVC ablation 09/12/15 with Dr. Elberta Fortis      Recurrent PVC's post ablation  3. Bradycardia     This has been limiting the ability to effectively treat his PVCs as well as his CM     HR has been 50's with brief/transient slowing into 40's at times   Given his bradycardia is limiting the ability to treat his CM and PVC's effectively, after lengthy discussion with the patient and Dr. Elberta Fortis, has been decided to proceed with device implant for tomorrow, Maisa Bedingfield plan for PPM, planned for today.  Dr. Elberta Fortis has discussed risk./benefits of procedure, the patient wishes to proceed.  4. Obesity  5. DM     Roxene Alviar ask diabetes RN to evaluate the patient  6. CP     No obstructive CAD by cath only a couple months ago     Mild abnormal troponin felt secondary to recent ablation procedure     Not recurrent since admission     Imdur was added   Francis Dowse,  PA-C 09/20/2015 9:34 AM   I have seen and examined this patient with Francis Dowse.  Agree with above, note added to reflect my findings.  On exam, regular rhythm, no murmurs, lungs clear.  Plan for dual chamber pacemaker today for sinus node dysfunction.  Once that device is in, Andriel Omalley plan for increasing beta blockers to better treat his HF.  Risks and benefits of the procedure were discussed.  Risks include but are not limited to bleeding, tamponade, infection, and pneumothorax.  Patient understands these risks and has agreed to the procedure.    Alec Mcphee M. Milee Qualls MD 09/20/2015 9:53 AM

## 2015-09-20 NOTE — Discharge Instructions (Signed)
° ° °  Supplemental Discharge Instructions for  °Pacemaker/Defibrillator Patients ° °Activity °No heavy lifting or vigorous activity with your left/right arm for 6 to 8 weeks.  Do not raise your left/right arm above your head for one week.  Gradually raise your affected arm as drawn below. ° °        °   09/24/15                    09/25/15                     09/26/15                  09/27/15 °__ ° °NO DRIVING for  1week  ; you may begin driving on  09/27/15    . ° °WOUND CARE °- Keep the wound area clean and dry.  Do not get this area wet for one week. No showers for one week; you may shower on  09/27/15   . °- The tape/steri-strips on your wound will fall off; do not pull them off.  No bandage is needed on the site.  DO  NOT apply any creams, oils, or ointments to the wound area. °- If you notice any drainage or discharge from the wound, any swelling or bruising at the site, or you develop a fever > 101? F after you are discharged home, call the office at once. ° °Special Instructions °- You are still able to use cellular telephones; use the ear opposite the side where you have your pacemaker/defibrillator.  Avoid carrying your cellular phone near your device. °- When traveling through airports, show security personnel your identification card to avoid being screened in the metal detectors.  Ask the security personnel to use the hand wand. °- Avoid arc welding equipment, MRI testing (magnetic resonance imaging), TENS units (transcutaneous nerve stimulators).  Call the office for questions about other devices. °- Avoid electrical appliances that are in poor condition or are not properly grounded. °- Microwave ovens are safe to be near or to operate. ° °Additional information for defibrillator patients should your device go off: °- If your device goes off ONCE and you feel fine afterward, notify the device clinic nurses. °- If your device goes off ONCE and you do not feel well afterward, call 911. °- If your device goes  off TWICE, call 911. °- If your device goes off THREE times in one day, call 911. ° °DO NOT DRIVE YOURSELF OR A FAMILY MEMBER °WITH A DEFIBRILLATOR TO THE HOSPITAL--CALL 911. ° °

## 2015-09-21 ENCOUNTER — Inpatient Hospital Stay (HOSPITAL_COMMUNITY): Payer: Self-pay

## 2015-09-21 MED ORDER — ISOSORBIDE MONONITRATE ER 30 MG PO TB24: 30.0000 mg | ORAL_TABLET | Freq: Every day | ORAL | 3 refills | Status: DC

## 2015-09-21 MED ORDER — METOPROLOL SUCCINATE ER 50 MG PO TB24: 50.0000 mg | ORAL_TABLET | Freq: Every day | ORAL | 3 refills | Status: DC

## 2015-09-21 MED ORDER — FUROSEMIDE 40 MG PO TABS: 40.0000 mg | ORAL_TABLET | Freq: Every day | ORAL | Status: AC | PRN

## 2015-09-21 NOTE — Care Management Note (Signed)
Case Management Note Donn Pierini RN, BSN Unit 2W-Case Manager [phone]  Patient Details  Name: HOSSEIN ETRIS MRN: [id number] Date of Birth: 15-Feb-1970  Subjective/Objective:     Pt admitted with chest pain s/p PPM on 8/16   Action/Plan: PTA pt lived at home- patient with no insurance , may need med ast.  Patient states he has a pcp Dr. Neita Carp,   Patient wants to keep his pcp Dr. Neita Carp for now,has info on [facility]  From previous admission- pt started on low cost meds that have been sent to [facility]- no medication assistance needed at this time.                  Expected Discharge Date:     09/21/15             Expected Discharge Plan:  Home/Self Care  In-House Referral:     Discharge planning Services  CM Consult, Medication Assistance  Post Acute Care Choice:    Choice offered to:     DME Arranged:    DME Agency:     HH Arranged:    HH Agency:     Status of Service:  Completed, signed off  If discussed at [employer] of Stay Meetings, dates discussed:    Additional Comments:  Darrold Span, RN 09/21/2015, 11:36 AM [phone]

## 2015-10-04 ENCOUNTER — Ambulatory Visit (INDEPENDENT_AMBULATORY_CARE_PROVIDER_SITE_OTHER): Payer: Self-pay | Admitting: *Deleted

## 2015-10-04 ENCOUNTER — Encounter: Payer: Self-pay | Admitting: Cardiology

## 2015-10-04 DIAGNOSIS — Z950 Presence of cardiac pacemaker: Secondary | ICD-10-CM

## 2015-10-04 NOTE — Progress Notes (Addendum)
Wound check appointment. Steri-strips removed. Wound without redness or edema. Incision edges approximated, stitch removed from left lateral incision, antibiotic ointment and bandaid applied. Wound otherwise well healed. Patient aware to call if he notices additional stitch, or with any signs/symptoms of infection, including drainage, fever, or chills.   Normal device function. Thresholds, sensing, and impedances consistent with implant measurements. Device programmed at 3.5V with auto capture programmed on for extra safety margin until 3 month visit. Histogram distribution appropriate for patient and level of activity. No mode switches. 1 NSVT episode, ~8bts. Patient educated about wound care, arm mobility, lifting restrictions. ROV with WC on 12/19/15.

## 2015-10-05 LAB — CUP PACEART INCLINIC DEVICE CHECK
Battery Voltage: 3.11 V
Brady Statistic AS VP Percent: 0.55 %
Brady Statistic RA Percent Paced: 60.48 %
Brady Statistic RV Percent Paced: 3.73 %
Implantable Lead Implant Date: 20170816
Implantable Lead Implant Date: 20170816
Implantable Lead Model: 5076
Implantable Lead Model: 5076
Lead Channel Impedance Value: 494 Ohm
Lead Channel Pacing Threshold Amplitude: 0.5 V
Lead Channel Pacing Threshold Pulse Width: 0.4 ms
Lead Channel Pacing Threshold Pulse Width: 0.4 ms
Lead Channel Sensing Intrinsic Amplitude: 6 mV
Lead Channel Setting Pacing Amplitude: 3.5 V
Lead Channel Setting Sensing Sensitivity: 0.9 mV
MDC IDC LEAD LOCATION: 753859
MDC IDC LEAD LOCATION: 753860
MDC IDC MSMT BATTERY REMAINING LONGEVITY: 124 mo
MDC IDC MSMT LEADCHNL RA IMPEDANCE VALUE: 380 Ohm
MDC IDC MSMT LEADCHNL RA IMPEDANCE VALUE: 513 Ohm
MDC IDC MSMT LEADCHNL RV IMPEDANCE VALUE: 532 Ohm
MDC IDC MSMT LEADCHNL RV PACING THRESHOLD AMPLITUDE: 0.75 V
MDC IDC MSMT LEADCHNL RV SENSING INTR AMPL: 14 mV
MDC IDC SESS DTM: 20170830133805
MDC IDC SET LEADCHNL RA PACING AMPLITUDE: 3.5 V
MDC IDC SET LEADCHNL RV PACING PULSEWIDTH: 0.4 ms
MDC IDC STAT BRADY AP VP PERCENT: 3.18 %
MDC IDC STAT BRADY AP VS PERCENT: 57.3 %
MDC IDC STAT BRADY AS VS PERCENT: 38.97 %

## 2015-10-11 ENCOUNTER — Ambulatory Visit: Payer: Self-pay | Admitting: Cardiology

## 2015-10-16 ENCOUNTER — Encounter (HOSPITAL_BASED_OUTPATIENT_CLINIC_OR_DEPARTMENT_OTHER): Payer: Self-pay

## 2015-10-18 ENCOUNTER — Encounter (HOSPITAL_COMMUNITY): Payer: Self-pay

## 2015-10-26 ENCOUNTER — Encounter: Payer: Self-pay | Admitting: *Deleted

## 2015-11-15 ENCOUNTER — Ambulatory Visit (HOSPITAL_COMMUNITY)
Admission: RE | Admit: 2015-11-15 | Discharge: 2015-11-15 | Disposition: A | Discharge status: Home / Self Care | Payer: Self-pay | Source: Ambulatory Visit | Attending: Cardiology | Admitting: Cardiology

## 2015-11-15 ENCOUNTER — Telehealth (HOSPITAL_COMMUNITY): Payer: Self-pay | Admitting: Vascular Surgery

## 2015-11-15 ENCOUNTER — Encounter (HOSPITAL_COMMUNITY): Payer: Self-pay

## 2015-11-15 ENCOUNTER — Encounter: Payer: Self-pay | Admitting: Cardiology

## 2015-11-15 VITALS — BP 123/71 | HR 85 | Wt 341.8 lb

## 2015-11-15 DIAGNOSIS — Z7984 Long term (current) use of oral hypoglycemic drugs: Secondary | ICD-10-CM | POA: Insufficient documentation

## 2015-11-15 DIAGNOSIS — R001 Bradycardia, unspecified: Secondary | ICD-10-CM | POA: Insufficient documentation

## 2015-11-15 DIAGNOSIS — E781 Pure hyperglyceridemia: Secondary | ICD-10-CM | POA: Insufficient documentation

## 2015-11-15 DIAGNOSIS — Z6841 Body Mass Index (BMI) 40.0 and over, adult: Secondary | ICD-10-CM | POA: Insufficient documentation

## 2015-11-15 DIAGNOSIS — Z7982 Long term (current) use of aspirin: Secondary | ICD-10-CM | POA: Insufficient documentation

## 2015-11-15 DIAGNOSIS — Z79899 Other long term (current) drug therapy: Secondary | ICD-10-CM | POA: Insufficient documentation

## 2015-11-15 DIAGNOSIS — I5042 Chronic combined systolic (congestive) and diastolic (congestive) heart failure: Secondary | ICD-10-CM

## 2015-11-15 DIAGNOSIS — I428 Other cardiomyopathies: Secondary | ICD-10-CM | POA: Insufficient documentation

## 2015-11-15 DIAGNOSIS — Z950 Presence of cardiac pacemaker: Secondary | ICD-10-CM | POA: Insufficient documentation

## 2015-11-15 DIAGNOSIS — I493 Ventricular premature depolarization: Secondary | ICD-10-CM | POA: Insufficient documentation

## 2015-11-15 DIAGNOSIS — I5022 Chronic systolic (congestive) heart failure: Secondary | ICD-10-CM | POA: Insufficient documentation

## 2015-11-15 DIAGNOSIS — Z8249 Family history of ischemic heart disease and other diseases of the circulatory system: Secondary | ICD-10-CM | POA: Insufficient documentation

## 2015-11-15 DIAGNOSIS — E119 Type 2 diabetes mellitus without complications: Secondary | ICD-10-CM | POA: Insufficient documentation

## 2015-11-15 MED ORDER — SACUBITRIL-VALSARTAN 24-26 MG PO TABS: 1.0000 | ORAL_TABLET | Freq: Two times a day (BID) | ORAL | 11 refills | Status: DC

## 2015-11-15 MED ORDER — SACUBITRIL-VALSARTAN 24-26 MG PO TABS: 1.0000 | ORAL_TABLET | Freq: Two times a day (BID) | ORAL | 3 refills | Status: DC

## 2015-11-15 NOTE — Telephone Encounter (Signed)
Left pt message to call back to get f/u appt schedule and lab work

## 2015-11-15 NOTE — Progress Notes (Signed)
PCP: Dr. Neita Carp EP: Dr. Elberta Fortis HF Cardiology: Dr. Shirlee Latch  45 yo with history of chronic systolic CHF/nonischemic cardiomyopathy, frequent PVCs/NSVT, and sinus node dysfunction s/p pacemaker presents for initial heart failure clinic evaluation.  He says that for about 6 months, he has had exertional dyspnea . 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   In 6/17, he was admitted with chest pain and exertional dyspnea.  There was concern for unstable angina.  LHC showed no obstructive CAD, but echo showed EF 30-35%.  He was diuresed.  He was noted to be bradycardic with HR in 40s and Coreg was held.    He was admitted again in 7/17 with chest pain and PVCs. HR in 30s at times, Coreg again stopped.  PVCs were noted to be very frequent, 19% on total beats on holter from 7/17.   In 8/17, he had a PVC ablation but PVCs recurred afterwards.  He was still bradycardic and had a Medtronic dual chamber PPM placed.   Initially after PPM, he says that he felt better.  However, over the last few weeks he has worsened again.  Of note, he was off metoprolol for about 10 days a couple of weeks ago.  He felt lots of PVCs (palpitations) off metoprolol.  He has been back on metoprolol for a week or so and feels better.  Palpitations are lessened but as still there.  Currently, he is still short of breath and weak with exertion.  He is short of breath after walking 100 feet or climbing stairs.  He continues to have occasional atypical (nonexertional) chest pain.  He wears out very easily.  Legs feel heavy. No orthopnea/PND. He snores per his wife and has daytime sleepiness.   PPM interrogation today: 45% a-pacing, 3% RV pacing.    ECG (8/17): a-paced, narrow QRS  Labs (8/17): K 4.2, creatinine 1.22  PMH: 1. Chronic systolic CHF: Nonischemic cardiomyopathy, ?due to PVCs versus prior myocarditis.  No  ETOH/drugs.  Has strong family history of CAD but not of cardiomyopathy.   - LHC (6/17): 30% mid RCA, no other significant disease.  - Echo (6/17): EF 30-35%.  - Holter (7/17): 19% PVCs.  2. Morbid obesity 3. Type II diabetes 4. Hypertriglyceridemia 5. NSVT/PVCs:  - 48 hour holter (7/17) with 19% PVCs. - PVC ablation in 8/17 but PVCs recurred.  6. Bradycardia: Sinus node dysfunction.  Dual chamber pacemaker placed (Medtronic, MRI compatible).   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   FH: Father with CHF (?cause, history of valve surgery), brother with CAD, uncles with MIs.  ROS: All systems reviewed and negative except as per HPI.  Current Outpatient Prescriptions  Medication Sig Dispense Refill  . aspirin EC 81 MG EC tablet Take 1 tablet (81 mg total) by mouth daily.    Marland Kitchen atorvastatin (LIPITOR) 40 MG tablet Take 1 tablet (40 mg total) by mouth daily at 6 PM. 30 tablet 6  . isosorbide mononitrate (IMDUR) 30 MG 24 hr tablet Take 1 tablet (30 mg total) by mouth daily. 30 tablet 3  . metFORMIN (GLUCOPHAGE) 1000 MG tablet Take 1 tablet (1,000 mg total) by mouth daily after supper. 30 tablet 6  . metoprolol succinate (TOPROL-XL) 50 MG 24 hr tablet Take 1 tablet (50 mg total) by mouth daily.  Take with or immediately following a meal. 30 tablet 3  . omega-3 acid ethyl esters (LOVAZA) 1 g capsule Take 2 capsules (2 g total) by mouth 2 (two) times daily. 120 capsule 6  . furosemide (LASIX) 40 MG tablet Take 1 tablet (40 mg total) by mouth daily as needed for fluid. (Patient not taking: Reported on 11/15/2015)    . nitroGLYCERIN (NITROSTAT) 0.4 MG SL tablet Place 0.4 mg under the tongue every 5 (five) minutes as needed for chest pain (3 DOSES MAX).    Marland Kitchen. sacubitril-valsartan (ENTRESTO) 24-26 MG Take 1 tablet by mouth 2 (two) times daily. 60 tablet 11   No current facility-administered medications for this encounter.    BP  123/71   Pulse 85   Wt (!) 341 lb 12 oz (155 kg)   SpO2 98%   BMI 43.88 kg/m  General: NAD, obese Neck: No JVD, no thyromegaly or thyroid nodule.  Lungs: Clear to auscultation bilaterally with normal respiratory effort. CV: Nondisplaced PMI.  Heart regular S1/S2, no S3/S4, no murmur.  1+ ankle edema.  No carotid bruit.  Normal pedal pulses.  Abdomen: Soft, nontender, no hepatosplenomegaly, no distention.  Skin: Intact without lesions or rashes.  Neurologic: Alert and oriented x 3.  Psych: Normal affect. Extremities: No clubbing or cyanosis.  HEENT: Normal.   Assessment/Plan: 1. Chronic systolic CHF: Nonischemic cardiomyopathy.  EF 30-35% on 6/17 echo.  LHC (6/17) with no significant CAD.  Holter 7/17 with 19% PVCs, ?PVC cardiomyopathy.  Also could be related to myocarditis.  No heavy ETOH/drugs.  Has strong family history of CAD but not nonischemic cardiomyopathy.  I interrogated his PPM today, he only rarely paces the RV.   - Continue Toprol XL 50 daily.  Titrate up over time.  - Stop lisinopril, after 36 hours, start Entresto 24/26 bid.  BMET in 10 days. - He has only been using lasix prn.  He does not appear to need daily diuretic dosing.  - Narrow QRS, CRT upgrade would be unlikely to be helpful. Given young age, if EF does not improve over 6 months or so, would favor ICD.  - Will need to check SPEP/UPEP.   - Continue medication titration, repeat echo in 6 months (12/17).  2. PVCs/NSVT: 19% PVCs on 7/17 holter.  ?PVC-mediated CMP.  When he does not take Toprol XL, he feels more palpitations.  He did have a PVC ablation in 8/17.  - Continue Toprol XL, titrate up next appt.   - Unable to fully quantify PVC percentage by device interrogation.  Therefore, I am going to arrange for 24 holter to see if PVC burden was affected by ablation and Toprol XL.  - Followup with Dr Elberta Fortisamnitz.  3. Suspect OSA: I will arrange for a sleep study.  4. Chest pain: Atypical, LHC 6/17 without significant  disease.  Doubt ischemic pain.  5. Sinus bradycardia: S/p dual chamber Medtronic PPM.  Interrogated today.   Followup in 3 wks.   Matthew Sexton 11/15/2015

## 2015-11-15 NOTE — Patient Instructions (Addendum)
Stop Lisinopril  Start Entresto 24/26 mg Twice daily STARTING Friday 10/13 AM  Labs in 10 days  Your physician has recommended that you wear a holter monitor. Holter monitors are medical devices that record the heart's electrical activity. Doctors most often use these monitors to diagnose arrhythmias. Arrhythmias are problems with the speed or rhythm of the heartbeat. The monitor is a small, portable device. You can wear one while you do your normal daily activities. This is usually used to diagnose what is causing palpitations/syncope (passing out).  We will reschedule your sleep study  Your physician recommends that you schedule a follow-up appointment in: 3 weeks

## 2015-11-21 ENCOUNTER — Telehealth: Payer: Self-pay | Admitting: Licensed Clinical Social Worker

## 2015-11-21 NOTE — Telephone Encounter (Signed)
CSW referred to assist pt with insurance. CSW attempted to contact pt message left. Lasandra Beech, LCSW [phone]

## 2015-11-23 ENCOUNTER — Telehealth: Payer: Self-pay | Admitting: Licensed Clinical Social Worker

## 2015-11-23 NOTE — Telephone Encounter (Signed)
SW was referred to pt for insurance assistance. SW attempted to contact pt, SW left message and callback number. Rolland Porter, SW Intern [place] [phone]

## 2015-11-27 ENCOUNTER — Inpatient Hospital Stay (HOSPITAL_COMMUNITY): Admission: RE | Admit: 2015-11-27 | Payer: Self-pay | Source: Ambulatory Visit

## 2015-12-01 ENCOUNTER — Ambulatory Visit (HOSPITAL_COMMUNITY)
Admission: RE | Admit: 2015-12-01 | Discharge: 2015-12-01 | Disposition: A | Discharge status: Home / Self Care | Payer: Self-pay | Source: Ambulatory Visit | Attending: Cardiology | Admitting: Cardiology

## 2015-12-01 DIAGNOSIS — G478 Other sleep disorders: Secondary | ICD-10-CM

## 2015-12-01 DIAGNOSIS — I5042 Chronic combined systolic (congestive) and diastolic (congestive) heart failure: Secondary | ICD-10-CM | POA: Insufficient documentation

## 2015-12-01 DIAGNOSIS — I5023 Acute on chronic systolic (congestive) heart failure: Secondary | ICD-10-CM

## 2015-12-05 ENCOUNTER — Ambulatory Visit (INDEPENDENT_AMBULATORY_CARE_PROVIDER_SITE_OTHER): Payer: Self-pay

## 2015-12-05 DIAGNOSIS — I493 Ventricular premature depolarization: Secondary | ICD-10-CM

## 2015-12-08 ENCOUNTER — Encounter: Payer: Self-pay | Admitting: Cardiology

## 2015-12-08 ENCOUNTER — Other Ambulatory Visit (HOSPITAL_COMMUNITY): Payer: Self-pay

## 2015-12-11 ENCOUNTER — Encounter (HOSPITAL_COMMUNITY): Payer: Self-pay

## 2015-12-11 ENCOUNTER — Ambulatory Visit (HOSPITAL_COMMUNITY)
Admission: RE | Admit: 2015-12-11 | Discharge: 2015-12-11 | Disposition: A | Discharge status: Home / Self Care | Payer: Self-pay | Source: Ambulatory Visit | Attending: Cardiology | Admitting: Cardiology

## 2015-12-11 VITALS — BP 134/90 | HR 84 | Wt 341.8 lb

## 2015-12-11 DIAGNOSIS — I5022 Chronic systolic (congestive) heart failure: Secondary | ICD-10-CM | POA: Insufficient documentation

## 2015-12-11 DIAGNOSIS — I493 Ventricular premature depolarization: Secondary | ICD-10-CM | POA: Insufficient documentation

## 2015-12-11 DIAGNOSIS — Z7982 Long term (current) use of aspirin: Secondary | ICD-10-CM | POA: Insufficient documentation

## 2015-12-11 DIAGNOSIS — I5042 Chronic combined systolic (congestive) and diastolic (congestive) heart failure: Secondary | ICD-10-CM

## 2015-12-11 DIAGNOSIS — Z8249 Family history of ischemic heart disease and other diseases of the circulatory system: Secondary | ICD-10-CM | POA: Insufficient documentation

## 2015-12-11 DIAGNOSIS — Z6841 Body Mass Index (BMI) 40.0 and over, adult: Secondary | ICD-10-CM | POA: Insufficient documentation

## 2015-12-11 DIAGNOSIS — E781 Pure hyperglyceridemia: Secondary | ICD-10-CM | POA: Insufficient documentation

## 2015-12-11 DIAGNOSIS — I429 Cardiomyopathy, unspecified: Secondary | ICD-10-CM | POA: Insufficient documentation

## 2015-12-11 DIAGNOSIS — Z7984 Long term (current) use of oral hypoglycemic drugs: Secondary | ICD-10-CM | POA: Insufficient documentation

## 2015-12-11 LAB — BASIC METABOLIC PANEL
Anion gap: 8 (ref 5–15)
BUN: 17 mg/dL (ref 6–20)
CALCIUM: 9.5 mg/dL (ref 8.9–10.3)
CO2: 25 mmol/L (ref 22–32)
CREATININE: 1.06 mg/dL (ref 0.61–1.24)
Chloride: 104 mmol/L (ref 101–111)
GFR calc Af Amer: >60 mL/min (ref >60)
GLUCOSE: 362 mg/dL — AB (ref 65–99)
Potassium: 4.2 mmol/L (ref 3.5–5.1)
Sodium: 137 mmol/L (ref 135–145)

## 2015-12-11 LAB — BRAIN NATRIURETIC PEPTIDE: B Natriuretic Peptide: 31.8 pg/mL (ref 0.0–100.0)

## 2015-12-11 MED ORDER — SACUBITRIL-VALSARTAN 49-51 MG PO TABS: 1.0000 | ORAL_TABLET | Freq: Two times a day (BID) | ORAL | 11 refills | Status: DC

## 2015-12-11 NOTE — Progress Notes (Signed)
PCP: Dr. Neita Carp EP: Dr. Elberta Fortis HF Cardiology: Dr. Shirlee Latch  45 yo with history of chronic systolic CHF/nonischemic cardiomyopathy, frequent PVCs/NSVT, and sinus node dysfunction s/p pacemaker presents for followup.  At initial appt, he said that for about 6 months, he had had exertional dyspnea . 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   In 6/17, he was admitted with chest pain and exertional dyspnea.  There was concern for unstable angina.  LHC showed no obstructive CAD, but echo showed EF 30-35%.  He was diuresed.  He was noted to be bradycardic with HR in 40s and Coreg was held.    He was admitted again in 7/17 with chest pain and PVCs. HR in 30s at times, Coreg again stopped.  PVCs were noted to be very frequent, 19% on total beats on holter from 7/17.   In 8/17, he had a PVC ablation but PVCs recurred afterwards.  He was still bradycardic and had a Medtronic dual chamber PPM placed.   At last appointment, I started him on Entresto.  This seems to have helped.  He is now fatigued after walking about 100 yards (better) and is short of breath with a flight of steps.  No chest pain.  He does not feel palpitations now (improved).  No orthopnea/PND. He snores per his wife and has daytime sleepiness.  Weight is stable.  He wore a holter monitor recently and just turned it in.   ECG (8/17): a-paced, narrow QRS  Labs (8/17): K 4.2, creatinine 1.22  PMH: 1. Chronic systolic CHF: Nonischemic cardiomyopathy, ?due to PVCs versus prior myocarditis.  No ETOH/drugs.  Has strong family history of CAD but not of cardiomyopathy.   - LHC (6/17): 30% mid RCA, no other significant disease.  - Echo (6/17): EF 30-35%.  - Holter (7/17): 19% PVCs.  2. Morbid obesity 3. Type II diabetes 4. Hypertriglyceridemia 5. NSVT/PVCs:  - 48 hour holter (7/17) with 19% PVCs. - PVC ablation in 8/17 but  PVCs recurred.  6. Bradycardia: Sinus node dysfunction.  Dual chamber pacemaker placed (Medtronic, MRI compatible).   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   FH: Father with CHF (?cause, history of valve surgery), brother with CAD, uncles with MIs.  ROS: All systems reviewed and negative except as per HPI.  Current Outpatient Prescriptions  Medication Sig Dispense Refill  . aspirin EC 81 MG EC tablet Take 1 tablet (81 mg total) by mouth daily.    Marland Kitchen atorvastatin (LIPITOR) 40 MG tablet Take 1 tablet (40 mg total) by mouth daily at 6 PM. 30 tablet 6  . isosorbide mononitrate (IMDUR) 30 MG 24 hr tablet Take 1 tablet (30 mg total) by mouth daily. 30 tablet 3  . metFORMIN (GLUCOPHAGE) 1000 MG tablet Take 1 tablet (1,000 mg total) by mouth daily after supper. 30 tablet 6  . metoprolol succinate (TOPROL-XL) 50 MG 24 hr tablet Take 1 tablet (50 mg total) by mouth daily. Take with or immediately following a meal. 30 tablet 3  . omega-3 acid ethyl esters (LOVAZA) 1 g capsule Take 2 capsules (2 g total) by mouth 2 (two) times daily. 120 capsule 6  . furosemide (LASIX) 40 MG tablet Take 1 tablet (40 mg total) by mouth daily as needed for fluid. (Patient not taking: Reported on 12/11/2015)    .  nitroGLYCERIN (NITROSTAT) 0.4 MG SL tablet Place 0.4 mg under the tongue every 5 (five) minutes as needed for chest pain (3 DOSES MAX).    Marland Kitchen. sacubitril-valsartan (ENTRESTO) 49-51 MG Take 1 tablet by mouth 2 (two) times daily. 60 tablet 11   No current facility-administered medications for this encounter.    BP 134/90   Pulse 84   Wt (!) 341 lb 12 oz (155 kg)   SpO2 98%   BMI 43.88 kg/m  General: NAD, obese Neck: No JVD, no thyromegaly or thyroid nodule.  Lungs: Clear to auscultation bilaterally with normal respiratory effort. CV: Nondisplaced PMI.  Heart regular S1/S2, no S3/S4, no murmur.  1+ ankle edema.  No carotid bruit.  Normal  pedal pulses.  Abdomen: Soft, nontender, no hepatosplenomegaly, no distention.  Skin: Intact without lesions or rashes.  Neurologic: Alert and oriented x 3.  Psych: Normal affect. Extremities: No clubbing or cyanosis.  HEENT: Normal.   Assessment/Plan: 1. Chronic systolic CHF: Nonischemic cardiomyopathy.  EF 30-35% on 6/17 echo.  LHC (6/17) with no significant CAD.  Holter 7/17 with 19% PVCs, ?PVC cardiomyopathy.  Also could be related to myocarditis.  No heavy ETOH/drugs.  Has strong family history of CAD but not nonischemic cardiomyopathy.  At last appointment, I interrogated his PPM today, he only rarely paces the RV.   - Continue Toprol XL 50 daily.  Titrate up over time.  - Increase Entresto to 49/51 bid, BMET/BNP today and repeat BMET in 10 days. - He has only been using lasix prn.  He does not appear to need daily diuretic dosing.  - Narrow QRS, CRT upgrade would be unlikely to be helpful. Given young age, if EF does not improve over 6 months or so, would favor ICD.  - Will need to check SPEP/UPEP today.   - At next appt, will add spironolactone.  - Continue medication titration, repeat echo once medications optimized. .  2. PVCs/NSVT: 19% PVCs on 7/17 holter.  ?PVC-mediated CMP.  When he does not take Toprol XL, he feels more palpitations.  He did have a PVC ablation in 8/17.  Less palpitations recently.  - Continue Toprol XL.   - Unable to fully quantify PVC percentage by device interrogation.  He recently wore a 24 hour holter, awaiting the results. - Followup with Dr Elberta Fortisamnitz.  3. Suspect OSA: Sleep study in 1/18.  4. Chest pain: Atypical, LHC 6/17 without significant disease.  Doubt ischemic pain.  5. Sinus bradycardia: S/p dual chamber Medtronic PPM.     Followup in 1 month.   Matthew Sexton 12/11/2015

## 2015-12-11 NOTE — Patient Instructions (Signed)
INCREASE Entresto to 49/51mg  twice daily.  Routine lab work today. Will notify you of abnormal results  Labs in 1 week at your PCP please have them fax results to [phone]  Follow up with Dr.McLean in 1 month

## 2015-12-12 ENCOUNTER — Telehealth (HOSPITAL_COMMUNITY): Payer: Self-pay | Admitting: *Deleted

## 2015-12-12 LAB — PROTEIN ELECTRO, RANDOM URINE
ALPHA-1-GLOBULIN, U: 4.6 %
ALPHA-2-GLOBULIN, U: 18.4 %
Albumin ELP, Urine: 23.7 %
BETA GLOBULIN, U: 20 %
Gamma Globulin, U: 33.3 %
PDF: 0
TOTAL PROTEIN, URINE-UPE24: 8.9 mg/dL

## 2015-12-12 NOTE — Telephone Encounter (Signed)
Notes Recorded by Modesta Messing, CMA on 12/12/2015 at 1:33 PM EST Left detailed message for patient.   ------  Notes Recorded by Laurey Morale, MD on 12/11/2015 at 4:00 PM EST OK except glucose is high, see PCP for followup of glucose.    Ref Range & Units 1d ago 71mo ago 25mo ago   Sodium 135 - 145 mmol/L 137  136  139R    Potassium 3.5 - 5.1 mmol/L 4.2  4.2  4.7R    Chloride 101 - 111 mmol/L 104  102  105R    CO2 22 - 32 mmol/L 25  23  23R    Glucose, Bld 65 - 99 mg/dL 409   811   914     BUN 6 - 20 mg/dL 17  30   78G    Creatinine, Ser 0.61 - 1.24 mg/dL 9.56  2.13  1.18R    Calcium 8.9 - 10.3 mg/dL 9.5  9.9  0.8M    GFR calc non Af Amer >60 mL/min >60  >60     GFR calc Af Amer >60 mL/min >60  >60CM    Comments: (NOTE)

## 2015-12-13 LAB — PROTEIN ELECTROPHORESIS, SERUM
A/G RATIO SPE: 1.3 (ref 0.7–1.7)
Albumin ELP: 3.7 g/dL (ref 2.9–4.4)
Alpha-1-Globulin: 0.2 g/dL (ref 0.0–0.4)
Alpha-2-Globulin: 0.7 g/dL (ref 0.4–1.0)
BETA GLOBULIN: 1 g/dL (ref 0.7–1.3)
GLOBULIN, TOTAL: 2.8 g/dL (ref 2.2–3.9)
Gamma Globulin: 1 g/dL (ref 0.4–1.8)
TOTAL PROTEIN ELP: 6.5 g/dL (ref 6.0–8.5)

## 2015-12-15 ENCOUNTER — Telehealth (HOSPITAL_COMMUNITY): Payer: Self-pay | Admitting: Pharmacist

## 2015-12-15 NOTE — Telephone Encounter (Signed)
[employer] patient assistance approved for Entresto 49-51 mg BID at no cost to patient through 12/12/16.   Matthew Sexton. Bonnye Fava, PharmD, BCPS, CPP Clinical Pharmacist Pager: [phone] Phone: [phone] 12/15/2015 10:24 AM

## 2015-12-19 ENCOUNTER — Encounter: Payer: Self-pay | Admitting: Cardiology

## 2016-01-02 ENCOUNTER — Encounter: Payer: Self-pay | Admitting: Cardiology

## 2016-01-02 ENCOUNTER — Encounter (HOSPITAL_BASED_OUTPATIENT_CLINIC_OR_DEPARTMENT_OTHER): Payer: Self-pay

## 2016-01-08 ENCOUNTER — Encounter (HOSPITAL_COMMUNITY): Payer: Self-pay

## 2016-01-15 ENCOUNTER — Encounter (HOSPITAL_COMMUNITY): Payer: Self-pay

## 2016-01-31 ENCOUNTER — Encounter (HOSPITAL_COMMUNITY): Payer: Self-pay | Admitting: *Deleted

## 2016-02-27 ENCOUNTER — Ambulatory Visit (HOSPITAL_BASED_OUTPATIENT_CLINIC_OR_DEPARTMENT_OTHER): Payer: BLUE CROSS/BLUE SHIELD | Attending: Cardiovascular Disease | Admitting: Cardiovascular Disease

## 2016-02-27 VITALS — Ht 74.0 in | Wt 329.0 lb

## 2016-02-27 DIAGNOSIS — E119 Type 2 diabetes mellitus without complications: Secondary | ICD-10-CM | POA: Diagnosis not present

## 2016-02-27 DIAGNOSIS — G4733 Obstructive sleep apnea (adult) (pediatric): Secondary | ICD-10-CM | POA: Diagnosis present

## 2016-02-27 DIAGNOSIS — R5383 Other fatigue: Secondary | ICD-10-CM | POA: Diagnosis not present

## 2016-02-27 DIAGNOSIS — E669 Obesity, unspecified: Secondary | ICD-10-CM | POA: Insufficient documentation

## 2016-02-27 DIAGNOSIS — R0683 Snoring: Secondary | ICD-10-CM | POA: Insufficient documentation

## 2016-03-15 NOTE — Procedures (Signed)
Patient Name: Matthew Sexton, Matthew Sexton Date: 02/27/2016 Gender: Male D.O.B: April 07, 1970 Age (years): 78 Referring Provider: Nicki Guadalajara MD, ABSM Height (inches): 74 Interpreting Physician: Nicki Guadalajara MD, ABSM Weight (lbs): 329 RPSGT: Ulyess Mort BMI: 42 MRN: [id number] Neck Size: 18.50  CLINICAL INFORMATION Sleep Study Type: NPSG  Indication for sleep study: Diabetes, Fatigue, Obesity, Snoring  Epworth Sleepiness Score: 17  SLEEP STUDY TECHNIQUE As per the AASM Manual for the Scoring of Sleep and Associated Events v2.3 (April 2016) with a hypopnea requiring 4% desaturations.  The channels recorded and monitored were frontal, central and occipital EEG, electrooculogram (EOG), submentalis EMG (chin), nasal and oral airflow, thoracic and abdominal wall motion, anterior tibialis EMG, snore microphone, electrocardiogram, and pulse oximetry.  MEDICATIONS aspirin EC 81 MG EC tablet atorvastatin (LIPITOR) 40 MG tablet furosemide (LASIX) 40 MG tablet isosorbide mononitrate (IMDUR) 30 MG 24 hr tablet metFORMIN (GLUCOPHAGE) 1000 MG tablet metoprolol succinate (TOPROL-XL) 50 MG 24 hr tablet nitroGLYCERIN (NITROSTAT) 0.4 MG SL tablet omega-3 acid ethyl esters (LOVAZA) 1 g capsule sacubitril-valsartan (ENTRESTO) 49-51 MG  Medications self-administered by patient taken the night of the study : N/A  SLEEP ARCHITECTURE The study was initiated at 9:35:51 PM and ended at 4:29:27 AM.  Sleep onset time was 13.2 minutes and the sleep efficiency was 91.9%. The total sleep time was 380.0 minutes.  Stage REM latency was 80.0 minutes.  The patient spent 16.58% of the night in stage N1 sleep, 65.79% in stage N2 sleep, 0.00% in stage N3 and 17.63% in REM.  Alpha intrusion was absent.  Supine sleep was 20.52%.  RESPIRATORY PARAMETERS The overall apnea/hypopnea index (AHI) was 5.2 per hour. There were 1 total apneas, including 1 obstructive, 0 central and 0 mixed apneas. There were 32  hypopneas and 14 RERAs.  The AHI during Stage REM sleep was 9.0 per hour.  AHI while supine was 13.8 per hour.  The mean oxygen saturation was 94.32%. The minimum SpO2 during sleep was 86.00%.  Moderate snoring was noted during this study.  CARDIAC DATA The 2 lead EKG demonstrated sinus rhythm. The mean heart rate was 61.43 beats per minute. Other EKG findings include: PVCs. The patient also had an episode of nonsustained wide complex rhythm  LEG MOVEMENT DATA The total PLMS were 0 with a resulting PLMS index of 0.00. Associated arousal with leg movement index was 0.0 .  IMPRESSIONS - Mild obstructive sleep apnea occurred during this study (AHI 5.2/h). Events were worse during during REM sleep (AHI 9.0/h) and with supine sleep (AHI 13.8/h). - No significant central sleep apnea occurred during this study (CAI = 0.0/h). - Mild oxygen desaturation to a nadir of 86.00%. - The patient snored with Moderate snoring volume. - Excessive daytime sleepiness - EKG findings include PVCs. The patient developed an episode of  nonsustained wide complex tachycardia. - Clinically significant periodic limb movements did not occur during sleep. No significant associated arousals.  DIAGNOSIS - Obstructive Sleep Apnea (327.23 [G47.33 ICD-10])  RECOMMENDATIONS - In this patient with significant cardiovascular comorbidities including a nonischemic cardiomyopathy with CHF, s/p ICD, NSVT and significant sleepiness (ESS 17) recommend CPAP titration study for optimal treatment of his sleep disordered breathing. If patient is against CPAP, then consider a customized oral appliance since his sleep apnea is mild. - Positional therapy avoiding supine position during sleep. - Avoid alcohol, sedatives and other CNS depressants that may worsen sleep apnea and disrupt normal sleep architecture. - Sleep hygiene should be reviewed to assess factors that  may improve sleep quality. - Weight management (BMI 42) and regular  exercise should be initiated or continued if appropriate.  [Electronically signed] 03/15/2016 07:13 PM  Nicki Guadalajara MD, [facility], ABSM Diplomate, American Board of Sleep Medicine   NPI: [id number] [facility] PH: [phone]   FX: [phone] ACCREDITED BY THE AMERICAN ACADEMY OF SLEEP MEDICINE

## 2016-03-26 ENCOUNTER — Telehealth: Payer: Self-pay | Admitting: *Deleted

## 2016-03-26 ENCOUNTER — Telehealth: Payer: Self-pay | Admitting: Cardiovascular Disease

## 2016-03-26 NOTE — Telephone Encounter (Signed)
Left message to return a call to discuss sleep study results and recommendations. 

## 2016-03-26 NOTE — Progress Notes (Signed)
03/26/16 left message to return a call.

## 2016-03-26 NOTE — Telephone Encounter (Signed)
New message      Returning nurses call to get sleep study results

## 2016-03-26 NOTE — Telephone Encounter (Signed)
-----   Message from Thomas A Kelly, MD sent at 03/15/2016  7:20 PM EST ----- Ndrew Creason please notify pt of results and set up for CPAP titration;   

## 2016-03-29 NOTE — Telephone Encounter (Signed)
Left message to return a call to discuss sleep study. Requested that he inform the operator when calling back to get me to the phone if he calls back today.

## 2016-03-29 NOTE — Progress Notes (Signed)
03/29/16 left message to return a call

## 2016-04-03 ENCOUNTER — Other Ambulatory Visit (HOSPITAL_COMMUNITY): Payer: Self-pay | Admitting: Cardiology

## 2016-04-03 MED ORDER — ISOSORBIDE MONONITRATE ER 30 MG PO TB24: 30.0000 mg | ORAL_TABLET | Freq: Every day | ORAL | 3 refills | Status: DC

## 2016-04-12 ENCOUNTER — Ambulatory Visit (HOSPITAL_COMMUNITY)
Admission: RE | Admit: 2016-04-12 | Discharge: 2016-04-12 | Disposition: A | Discharge status: Home / Self Care | Payer: BLUE CROSS/BLUE SHIELD | Source: Ambulatory Visit | Attending: Cardiology | Admitting: Cardiology

## 2016-04-12 VITALS — BP 120/80 | HR 84 | Wt 333.8 lb

## 2016-04-12 DIAGNOSIS — R001 Bradycardia, unspecified: Secondary | ICD-10-CM | POA: Insufficient documentation

## 2016-04-12 DIAGNOSIS — I493 Ventricular premature depolarization: Secondary | ICD-10-CM | POA: Diagnosis not present

## 2016-04-12 DIAGNOSIS — I472 Ventricular tachycardia: Secondary | ICD-10-CM | POA: Insufficient documentation

## 2016-04-12 DIAGNOSIS — Z7982 Long term (current) use of aspirin: Secondary | ICD-10-CM | POA: Diagnosis not present

## 2016-04-12 DIAGNOSIS — E119 Type 2 diabetes mellitus without complications: Secondary | ICD-10-CM | POA: Diagnosis not present

## 2016-04-12 DIAGNOSIS — Z7984 Long term (current) use of oral hypoglycemic drugs: Secondary | ICD-10-CM | POA: Insufficient documentation

## 2016-04-12 DIAGNOSIS — I25119 Atherosclerotic heart disease of native coronary artery with unspecified angina pectoris: Secondary | ICD-10-CM

## 2016-04-12 DIAGNOSIS — I5022 Chronic systolic (congestive) heart failure: Secondary | ICD-10-CM | POA: Diagnosis not present

## 2016-04-12 DIAGNOSIS — E781 Pure hyperglyceridemia: Secondary | ICD-10-CM | POA: Insufficient documentation

## 2016-04-12 DIAGNOSIS — Z950 Presence of cardiac pacemaker: Secondary | ICD-10-CM | POA: Diagnosis not present

## 2016-04-12 DIAGNOSIS — I429 Cardiomyopathy, unspecified: Secondary | ICD-10-CM | POA: Insufficient documentation

## 2016-04-12 DIAGNOSIS — I739 Peripheral vascular disease, unspecified: Secondary | ICD-10-CM

## 2016-04-12 DIAGNOSIS — I5042 Chronic combined systolic (congestive) and diastolic (congestive) heart failure: Secondary | ICD-10-CM | POA: Diagnosis not present

## 2016-04-12 DIAGNOSIS — E7800 Pure hypercholesterolemia, unspecified: Secondary | ICD-10-CM

## 2016-04-12 DIAGNOSIS — Z8249 Family history of ischemic heart disease and other diseases of the circulatory system: Secondary | ICD-10-CM | POA: Diagnosis not present

## 2016-04-12 LAB — BASIC METABOLIC PANEL
ANION GAP: 6 (ref 5–15)
BUN: 18 mg/dL (ref 6–20)
CHLORIDE: 104 mmol/L (ref 101–111)
CO2: 26 mmol/L (ref 22–32)
Calcium: 9.5 mg/dL (ref 8.9–10.3)
Creatinine, Ser: 1.2 mg/dL (ref 0.61–1.24)
GFR calc Af Amer: >60 mL/min (ref >60)
GLUCOSE: 273 mg/dL — AB (ref 65–99)
POTASSIUM: 4.5 mmol/L (ref 3.5–5.1)
Sodium: 136 mmol/L (ref 135–145)

## 2016-04-12 MED ORDER — SPIRONOLACTONE 25 MG PO TABS: 12.5000 mg | ORAL_TABLET | Freq: Every day | ORAL | 3 refills | Status: AC

## 2016-04-12 NOTE — Progress Notes (Signed)
Advanced Heart Failure Medication Review by a Pharmacist  Does the patient  feel that his/her medications are working for him/her?  yes  Has the patient been experiencing any side effects to the medications prescribed?  no  Does the patient measure his/her own blood pressure or blood glucose at home?  yes   Does the patient have any problems obtaining medications due to transportation or finances?   no  Understanding of regimen: good Understanding of indications: good Potential of compliance: good Patient understands to avoid NSAIDs. Patient understands to avoid decongestants.  Issues to address at subsequent visits: None   Pharmacist comments: Matthew Sexton is a pleasant 46 yo WM who presents to advanced HF clinic with his wife. He endorses compliance with his medications, only needing 2 doses of Lasix and 0 doses of NTG in the past 2 weeks. He asked about refilling Entresto through the PAF, instructions were left on his AVS for today. No questions or concerns.   Allie Bossier, PharmD PGY1 Pharmacy Resident [phone] (Pager) 04/12/2016 3:00 PM  Time with patient: 10 min Preparation and documentation time: 2 min Total time: 12 min

## 2016-04-12 NOTE — Patient Instructions (Addendum)
START Spironolactone 12.5 mg (1/2 tablet) once daily.  STOP Atorvastatin for 2 weeks.  Let our office know if this made an improvement in leg weakness/discomfort or not.  Please call [employer] for [place] refills at [phone]. They will ship it to your home.  Will refer you to [facility].  Will schedule you for Cardiopulmonary Exercise Test at [facility]. This test is done at our Heart Failure Clinic. Please wear comfortable clothes and shoes for this test. Avoid heavy meal before the test (light snack/meal recommended). Avoid caffeine, alcohol, tobacco products 12 hrs before test. Please give 24 hr notice for cancellations/rescheduling: [phone].  Will schedule you for echocardiogram and ABI study at [facility]. Same check in as today.  Routine lab work today. Will notify you of abnormal results, otherwise no news is good news!  Return in 2 weeks for lab work.  Follow up with Dr. Shirlee Latch in 6-8 weeks.  Do the following things EVERYDAY: 1) Weigh yourself in the morning before breakfast. Write it down and keep it in a log. 2) Take your medicines as prescribed 3) Eat low salt foods-Limit salt (sodium) to 2000 mg per day.  4) Stay as active as you can everyday 5) Limit all fluids for the day to less than 2 liters

## 2016-04-14 NOTE — Progress Notes (Signed)
PCP: Dr. Neita Carp EP: Dr. Elberta Fortis HF Cardiology: Dr. Shirlee Latch  46 yo with history of chronic systolic CHF/nonischemic cardiomyopathy, frequent PVCs/NSVT, and sinus node dysfunction s/p pacemaker presents for followup.  At initial appt, he said that for about 6 months, he had had exertional dyspnea . 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   In 6/17, he was admitted with chest pain and exertional dyspnea.  There was concern for unstable angina.  LHC showed no obstructive CAD, but echo showed EF 30-35%.  He was diuresed.  He was noted to be bradycardic with HR in 40s and Coreg was held.    He was admitted again in 7/17 with chest pain and PVCs. HR in 30s at times, Coreg again stopped.  PVCs were noted to be very frequent, 19% on total beats on holter from 7/17.   In 8/17, he had a PVC ablation but PVCs recurred afterwards.  He was still bradycardic and had a Medtronic dual chamber PPM placed.  Repeat holter in 11/17 showed improvement in PVCs, <1% of total beats.   He is doing better overall but is still limited.  He is working as a Optician, dispensing and for a Financial trader.  Rare palpitations now. Less short of breath, gets short of breath now with lifting or walking up stairs.  No dyspnea on flat ground.  He still fatigues very easily.  Legs get very tired after walking 100 yards.  Weight is down 7 lbs.    ECG (personally reviewed): a-paced, narrow QRS  Labs (8/17): K 4.2, creatinine 1.22 Labs (11/17): K 4.2, creatinine 1.06, SPEP negative, BNP 32  PMH: 1. Chronic systolic CHF: Nonischemic cardiomyopathy, ?due to PVCs versus prior myocarditis.  No ETOH/drugs.  Has strong family history of CAD but not of cardiomyopathy.   - LHC (6/17): 30% mid RCA, no other significant disease.  - Echo (6/17): EF 30-35%.  - Holter (7/17): 19% PVCs.  2. Morbid obesity 3. Type II  diabetes 4. Hypertriglyceridemia 5. NSVT/PVCs:  - 48 hour holter (7/17) with 19% PVCs. - PVC ablation in 8/17 but PVCs recurred.  - Holter (11/17) with rare PVCs, < 1%.  6. Bradycardia: Sinus node dysfunction.  Dual chamber pacemaker placed (Medtronic, MRI compatible).  7. 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   FH: Father with CHF (?cause, history of valve surgery), brother with CAD, uncles with MIs.  ROS: All systems reviewed and negative except as per HPI.  Current Outpatient Prescriptions  Medication Sig Dispense Refill  . aspirin EC 81 MG EC tablet Take 1 tablet (81 mg total) by mouth daily.    Marland Kitchen FLUoxetine (PROZAC) 20 MG tablet Take 20 mg by mouth daily.    . furosemide (LASIX) 40 MG tablet Take 1 tablet (40 mg total) by mouth daily as needed for fluid.    . isosorbide mononitrate (IMDUR) 30 MG 24 hr tablet Take 1 tablet (30 mg total) by mouth daily. 30 tablet 3  . metFORMIN (GLUCOPHAGE) 1000 MG tablet Take 1 tablet (1,000 mg total) by mouth daily after supper. 30 tablet 6  . metoprolol succinate (TOPROL-XL) 50 MG 24 hr tablet Take 1 tablet (50 mg total) by mouth daily. Take with or immediately following a meal. 30 tablet 3  .  omega-3 acid ethyl esters (LOVAZA) 1 g capsule Take 2 capsules (2 g total) by mouth 2 (two) times daily. 120 capsule 6  . sacubitril-valsartan (ENTRESTO) 49-51 MG Take 1 tablet by mouth 2 (two) times daily. 60 tablet 11  . nitroGLYCERIN (NITROSTAT) 0.4 MG SL tablet Place 0.4 mg under the tongue every 5 (five) minutes as needed for chest pain (3 DOSES MAX).    Marland Kitchen spironolactone (ALDACTONE) 25 MG tablet Take 0.5 tablets (12.5 mg total) by mouth daily. 45 tablet 3   No current facility-administered medications for this encounter.    BP 120/80 (BP Location: Right Arm, Patient Position: Sitting, Cuff Size: Large)   Pulse 84   Wt (!) 333 lb 12.8 oz (151.4 kg)    SpO2 94%   BMI 42.86 kg/m  General: NAD, obese Neck: Thick, no JVD, no thyromegaly or thyroid nodule.  Lungs: Clear to auscultation bilaterally with normal respiratory effort. CV: Nondisplaced PMI.  Heart regular S1/S2, no S3/S4, no murmur.  Trace ankle edema.  No carotid bruit.  Unable to palpate pedal pulses.  Abdomen: Soft, nontender, no hepatosplenomegaly, no distention.  Skin: Intact without lesions or rashes.  Neurologic: Alert and oriented x 3.  Psych: Normal affect. Extremities: No clubbing or cyanosis.  HEENT: Normal.   Assessment/Plan: 1. Chronic systolic CHF: Nonischemic cardiomyopathy.  EF 30-35% on 6/17 echo.  LHC (6/17) with no significant CAD.  Holter 7/17 with 19% PVCs, ?PVC cardiomyopathy => improved to < 1% on 11/17 holter.  Also could be related to myocarditis.  No heavy ETOH/drugs.  Has strong family history of CAD but not nonischemic cardiomyopathy.  He only rarely paces the RV.  NYHA class II-III symptoms.  Not volume overloaded on exam.  - I do not think that he needs Lasix.  - Continue Toprol XL 50 daily.  Titrate up over time.  - Continue Entresto 49/51 bid.  BMET today.  - Start spironolactone 12.5 mg daily. BMET 2 wks.  - Narrow QRS, CRT upgrade would be unlikely to be helpful. Given young age, if EF remains low, would favor ICD. I will arrange for repeat echo.  - I will arrange for CPX. - I am going to arrange for cardiac rehab at [facility].  2. PVCs/NSVT: 19% PVCs on 7/17 holter.  ?PVC-mediated CMP.  When he does not take Toprol XL, he feels more palpitations.  He did have a PVC ablation in 8/17.  Most recent holter in 11/17 showed PVCs down to <1%.    - Continue Toprol XL.   - Followup with Dr Elberta Fortis.  3. Chest pain: Atypical, LHC 6/17 without significant disease.  Doubt ischemic pain.  4. Sinus bradycardia: S/p dual chamber Medtronic PPM, he a-paces but only rarely paces RV.    5. Leg fatigue/weakness: Difficult to palpate pedal pulses, I will arrange  for ABIs.  I will also have him hold atorvastatin for 2 wks. If this helps leg symptoms, he can stay off atorvastatin.  If not, restart it after 2 wks.  6. Obesity: Patient asks about bariatric surgery.  We will give him the information on the bariatric program at [facility].   Marca Ancona 04/14/2016

## 2016-04-16 ENCOUNTER — Telehealth: Payer: Self-pay | Admitting: Cardiovascular Disease

## 2016-04-16 ENCOUNTER — Telehealth: Payer: Self-pay | Admitting: *Deleted

## 2016-04-16 ENCOUNTER — Encounter: Payer: Self-pay | Admitting: *Deleted

## 2016-04-16 NOTE — Telephone Encounter (Signed)
-----   Message from Thomas A Kelly, MD sent at 03/15/2016  7:20 PM EST ----- Matthew Sexton please notify pt of results and set up for CPAP titration;   

## 2016-04-16 NOTE — Telephone Encounter (Signed)
Patient notified of sleep study results and recommendations. He opted not to do the CPAP titration.( per sleep study) He prefers to try oral appliance. Referral and notes faxed to Dr Myrtis Ser.

## 2016-04-16 NOTE — Telephone Encounter (Signed)
-----   Message from [name], MD sent at 03/15/2016  7:20 PM EST ----- Matthew Sexton please notify pt of results and set up for CPAP titration;

## 2016-04-16 NOTE — Progress Notes (Signed)
04/16/16 left message to return a call.

## 2016-04-16 NOTE — Telephone Encounter (Signed)
Records and order faxed to D Althea Grimmer for oral appliance consultation to treat OSA.

## 2016-04-16 NOTE — Telephone Encounter (Signed)
Left message to return a call. This is the 3rd message left. Will not be calling back. Will wait for a return call. If no call received a letter will be mailed to patient with results and recommendations.

## 2016-04-16 NOTE — Telephone Encounter (Signed)
Matthew Sexton is calling to speak about his sleep study . Please call

## 2016-04-17 ENCOUNTER — Encounter (HOSPITAL_COMMUNITY): Payer: Self-pay

## 2016-04-17 NOTE — Progress Notes (Signed)
Received another VM from patient's wife stating patient only took 1st dose of spironolactone today and dizziness has been since McLean's apt. Attempted to return call, again left another VM with details to come to clinic in am for evaluation of vitals and to still STOP the spiro until further evaluation. Advised to return call to CHF clinic and push option to speak to scheduler to either be added on the nurse schedule for vitals or on Dr. Alford Highland schedule to be seen. Also advised if s/s worsen overnight to call afterhours line or go to ED.  Ave Filter, RN

## 2016-04-17 NOTE — Progress Notes (Signed)
Patient's wife left VM concerning patient's dizziness since seeing Dr. Shirlee Latch and starting Spironolactone 12.5 mg once daily. Attempted to return call, no answer. Left detailed VM stating to STOP spironolactone, drink extra fluids today, and check BP either at home, nearby BP station, PCP, or here per Dr. Alford Highland recommendations and to return call to let us know message was received as this sounds BP related. Will attempt to call again later today.  Ave Filter, RN

## 2016-04-19 NOTE — Telephone Encounter (Signed)
See other phone message  

## 2016-04-19 NOTE — Progress Notes (Signed)
Patient decided to get oral appliance vs having a CPAP titration study. ( see sleep study recommendations) . Referral sent to Dr Althea Grimmer, DDS.

## 2016-04-25 ENCOUNTER — Ambulatory Visit (HOSPITAL_COMMUNITY)
Admission: RE | Admit: 2016-04-25 | Discharge: 2016-04-25 | Disposition: A | Discharge status: Home / Self Care | Payer: BLUE CROSS/BLUE SHIELD | Source: Ambulatory Visit | Attending: Family Medicine | Admitting: Family Medicine

## 2016-04-25 ENCOUNTER — Ambulatory Visit (HOSPITAL_BASED_OUTPATIENT_CLINIC_OR_DEPARTMENT_OTHER)
Admission: RE | Admit: 2016-04-25 | Discharge: 2016-04-25 | Disposition: A | Discharge status: Home / Self Care | Payer: BLUE CROSS/BLUE SHIELD | Source: Ambulatory Visit

## 2016-04-25 ENCOUNTER — Ambulatory Visit (HOSPITAL_COMMUNITY): Payer: BLUE CROSS/BLUE SHIELD

## 2016-04-25 ENCOUNTER — Ambulatory Visit (HOSPITAL_COMMUNITY)
Admission: RE | Admit: 2016-04-25 | Discharge: 2016-04-25 | Disposition: A | Discharge status: Home / Self Care | Payer: BLUE CROSS/BLUE SHIELD | Source: Ambulatory Visit | Attending: Internal Medicine | Admitting: Internal Medicine

## 2016-04-25 DIAGNOSIS — E785 Hyperlipidemia, unspecified: Secondary | ICD-10-CM | POA: Insufficient documentation

## 2016-04-25 DIAGNOSIS — I429 Cardiomyopathy, unspecified: Secondary | ICD-10-CM | POA: Diagnosis not present

## 2016-04-25 DIAGNOSIS — I209 Angina pectoris, unspecified: Secondary | ICD-10-CM | POA: Insufficient documentation

## 2016-04-25 DIAGNOSIS — I5042 Chronic combined systolic (congestive) and diastolic (congestive) heart failure: Secondary | ICD-10-CM | POA: Diagnosis not present

## 2016-04-25 DIAGNOSIS — I739 Peripheral vascular disease, unspecified: Secondary | ICD-10-CM

## 2016-04-25 DIAGNOSIS — E1151 Type 2 diabetes mellitus with diabetic peripheral angiopathy without gangrene: Secondary | ICD-10-CM | POA: Diagnosis not present

## 2016-04-25 DIAGNOSIS — I071 Rheumatic tricuspid insufficiency: Secondary | ICD-10-CM | POA: Diagnosis not present

## 2016-04-25 DIAGNOSIS — I472 Ventricular tachycardia: Secondary | ICD-10-CM | POA: Diagnosis not present

## 2016-04-25 DIAGNOSIS — I371 Nonrheumatic pulmonary valve insufficiency: Secondary | ICD-10-CM | POA: Diagnosis not present

## 2016-04-25 LAB — BASIC METABOLIC PANEL
Anion gap: 7 (ref 5–15)
BUN: 16 mg/dL (ref 6–20)
CALCIUM: 8.7 mg/dL — AB (ref 8.9–10.3)
CO2: 24 mmol/L (ref 22–32)
CREATININE: 0.94 mg/dL (ref 0.61–1.24)
Chloride: 102 mmol/L (ref 101–111)
Glucose, Bld: 332 mg/dL — ABNORMAL HIGH (ref 65–99)
Potassium: 5.1 mmol/L (ref 3.5–5.1)
SODIUM: 133 mmol/L — AB (ref 135–145)

## 2016-04-25 LAB — ECHOCARDIOGRAM COMPLETE
Ao-asc: 30 cm
Area-P 1/2: 2.27 cm2
CHL CUP DOP CALC LVOT VTI: 20.1 cm
CHL CUP MV DEC (S): 285
CHL CUP RV SYS PRESS: 23 mmHg
CHL CUP STROKE VOLUME: 47 mL
CHL CUP TV REG PEAK VELOCITY: 221 cm/s
E decel time: 285 msec
E/e' ratio: 10.86
FS: 30 % (ref 28–44)
IVS/LV PW RATIO, ED: 1.89
LA ID, A-P, ES: 34 mm
LA diam index: 1.26 cm/m2
LA vol A4C: 37.8 ml
LA vol index: 20.7 mL/m2
LAVOL: 55.9 mL
LDCA: 4.52 cm2
LEFT ATRIUM END SYS DIAM: 34 mm
LV SIMPSON'S DISK: 55
LV TDI E'LATERAL: 8.81
LV TDI E'MEDIAL: 6.09
LV sys vol index: 14 mL/m2
LVDIAVOL: 85 mL (ref 62–150)
LVDIAVOLIN: 32 mL/m2
LVEEAVG: 10.86
LVEEMED: 10.86
LVELAT: 8.81 cm/s
LVOT peak grad rest: 4 mmHg
LVOTD: 24 mm
LVOTPV: 94.2 cm/s
LVOTSV: 91 mL
LVSYSVOL: 39 mL (ref 21–61)
Lateral S' vel: 13.4 cm/s
MV Peak grad: 4 mmHg
MV pk E vel: 95.7 m/s
MVPKAVEL: 64 m/s
P 1/2 time: 97 ms
PW: 10.5 mm — AB (ref 0.6–1.1)
RV TAPSE: 23.4 mm
S' Lateral: 9.46 cm/s
TR max vel: 221 cm/s

## 2016-04-25 LAB — BRAIN NATRIURETIC PEPTIDE: B NATRIURETIC PEPTIDE 5: 28.1 pg/mL (ref 0.0–100.0)

## 2016-04-25 MED ORDER — PERFLUTREN LIPID MICROSPHERE
1.0000 mL | INTRAVENOUS | Status: AC | PRN
  Administered 2016-04-25: 4 mL via INTRAVENOUS
  Filled 2016-04-25: qty 10

## 2016-04-25 NOTE — Progress Notes (Signed)
VASCULAR LAB PRELIMINARY  ARTERIAL  ABI completed:    RIGHT    LEFT    PRESSURE WAVEFORM  PRESSURE WAVEFORM  BRACHIAL 130 Tri BRACHIAL 128 Tri  DP 149 Tri DP 139 Tri  PT 158 Tri PT 151 Tri  GREAT TOE  NA GREAT TOE  NA    RIGHT LEFT  ABI 1.2 1.1     Christmas Faraci C Jone Panebianco, RVT 04/25/2016, 10:00 AM

## 2016-04-25 NOTE — Progress Notes (Signed)
  Echocardiogram 2D Echocardiogram has been performed.  Matthew Sexton 04/25/2016, 9:24 AM

## 2016-05-03 ENCOUNTER — Telehealth (HOSPITAL_COMMUNITY): Payer: Self-pay | Admitting: *Deleted

## 2016-05-03 DIAGNOSIS — I5042 Chronic combined systolic (congestive) and diastolic (congestive) heart failure: Secondary | ICD-10-CM

## 2016-05-03 NOTE — Telephone Encounter (Signed)
  ECHOCARDIOGRAM COMPLETE  Order: [id number]  Status:  Final result Visible to patient:  Yes (MyChart) Dx:  Chronic combined systolic and diastol...  Notes recorded by Suezanne Cheshire, RN on 05/03/2016 at 1:26 PM EDT Called and patient aware, no further questions at this time. ------  Notes recorded by Suezanne Cheshire, RN on 05/03/2016 at 8:13 AM EDT Called and left message to call us back as soon as possible regarding lab and test results. ------  Notes recorded by Laurey Morale, MD on 04/25/2016 at 9:31 PM EDT EF improved to 50-55%.   VAS [place] ABI WITH/WO TBI  Order: [id number]  Status:  Final result Visible to patient:  Yes (MyChart) Dx:  Claudication in peripheral vascular d...  Notes recorded by Suezanne Cheshire, RN on 05/03/2016 at 1:26 PM EDT Called and patient aware, no further questions at this time. ------  Notes recorded by Suezanne Cheshire, RN on 05/03/2016 at 8:13 AM EDT Called and left message to call us back as soon as possible regarding lab and test results. ------  Notes recorded by Laurey Morale, MD on 04/25/2016 at 9:31 PM EDT No significant vascular disease.          Basic metabolic panel  Order: [id number]  Status:  Final result Visible to patient:  Yes (MyChart) Dx:  Chronic combined systolic and diastol...  Notes recorded by Suezanne Cheshire, RN on 05/03/2016 at 1:13 PM EDT Spoke with patient and he is aware and agreeable. Lab appt scheduled and lab order placed. ------  Notes recorded by Suezanne Cheshire, RN on 05/03/2016 at 8:14 AM EDT Called and left message to call us back as soon as possible regarding lab and test results. ------  Notes recorded by Modesta Messing, CMA on 04/26/2016 at 2:55 PM EDT Left VM for pt to call back for lab results. ------  Notes recorded by Laurey Morale, MD on 04/25/2016 at 9:32 PM EDT Follow low K diet. Repeat K in 1 month.

## 2016-05-15 ENCOUNTER — Other Ambulatory Visit (HOSPITAL_COMMUNITY): Payer: Self-pay | Admitting: Cardiology

## 2016-05-15 MED ORDER — METOPROLOL SUCCINATE ER 50 MG PO TB24: 50.0000 mg | ORAL_TABLET | Freq: Every day | ORAL | 3 refills | Status: DC

## 2016-05-29 ENCOUNTER — Ambulatory Visit (HOSPITAL_COMMUNITY)
Admission: RE | Admit: 2016-05-29 | Discharge: 2016-05-29 | Disposition: A | Discharge status: Home / Self Care | Payer: BLUE CROSS/BLUE SHIELD | Source: Ambulatory Visit | Attending: Internal Medicine | Admitting: Internal Medicine

## 2016-05-29 DIAGNOSIS — I5042 Chronic combined systolic (congestive) and diastolic (congestive) heart failure: Secondary | ICD-10-CM | POA: Insufficient documentation

## 2016-05-29 LAB — BASIC METABOLIC PANEL
Anion gap: 8 (ref 5–15)
BUN: 15 mg/dL (ref 6–20)
CALCIUM: 9.3 mg/dL (ref 8.9–10.3)
CHLORIDE: 102 mmol/L (ref 101–111)
CO2: 25 mmol/L (ref 22–32)
CREATININE: 0.94 mg/dL (ref 0.61–1.24)
GFR calc non Af Amer: >60 mL/min (ref >60)
Glucose, Bld: 312 mg/dL — ABNORMAL HIGH (ref 65–99)
Potassium: 4.5 mmol/L (ref 3.5–5.1)
SODIUM: 135 mmol/L (ref 135–145)

## 2016-06-07 ENCOUNTER — Encounter (HOSPITAL_COMMUNITY): Payer: BLUE CROSS/BLUE SHIELD

## 2016-06-21 ENCOUNTER — Telehealth (HOSPITAL_COMMUNITY): Payer: Self-pay | Admitting: *Deleted

## 2016-06-21 NOTE — Telephone Encounter (Signed)
Pts wife called stating patient has been sweating profusely at rest. No other symptoms/ no fever.  Per Dr.McLean as long as no other signs/symptoms are occurring he wouldn't be too concerned just monitor patient. pts wife aware and agreeable.

## 2016-07-04 ENCOUNTER — Encounter (HOSPITAL_COMMUNITY): Payer: Self-pay

## 2016-07-04 ENCOUNTER — Ambulatory Visit (HOSPITAL_COMMUNITY)
Admission: RE | Admit: 2016-07-04 | Discharge: 2016-07-04 | Disposition: A | Discharge status: Home / Self Care | Payer: BLUE CROSS/BLUE SHIELD | Source: Ambulatory Visit | Attending: Cardiology | Admitting: Cardiology

## 2016-07-04 VITALS — BP 108/71 | HR 84 | Wt 328.8 lb

## 2016-07-04 DIAGNOSIS — Z8249 Family history of ischemic heart disease and other diseases of the circulatory system: Secondary | ICD-10-CM | POA: Insufficient documentation

## 2016-07-04 DIAGNOSIS — Z79899 Other long term (current) drug therapy: Secondary | ICD-10-CM | POA: Diagnosis not present

## 2016-07-04 DIAGNOSIS — Z950 Presence of cardiac pacemaker: Secondary | ICD-10-CM | POA: Insufficient documentation

## 2016-07-04 DIAGNOSIS — I428 Other cardiomyopathies: Secondary | ICD-10-CM | POA: Insufficient documentation

## 2016-07-04 DIAGNOSIS — E119 Type 2 diabetes mellitus without complications: Secondary | ICD-10-CM | POA: Diagnosis not present

## 2016-07-04 DIAGNOSIS — Z7982 Long term (current) use of aspirin: Secondary | ICD-10-CM | POA: Insufficient documentation

## 2016-07-04 DIAGNOSIS — R001 Bradycardia, unspecified: Secondary | ICD-10-CM | POA: Insufficient documentation

## 2016-07-04 DIAGNOSIS — I493 Ventricular premature depolarization: Secondary | ICD-10-CM | POA: Diagnosis not present

## 2016-07-04 DIAGNOSIS — Z6841 Body Mass Index (BMI) 40.0 and over, adult: Secondary | ICD-10-CM | POA: Insufficient documentation

## 2016-07-04 DIAGNOSIS — G4733 Obstructive sleep apnea (adult) (pediatric): Secondary | ICD-10-CM | POA: Insufficient documentation

## 2016-07-04 DIAGNOSIS — E781 Pure hyperglyceridemia: Secondary | ICD-10-CM | POA: Diagnosis not present

## 2016-07-04 DIAGNOSIS — I5042 Chronic combined systolic (congestive) and diastolic (congestive) heart failure: Secondary | ICD-10-CM

## 2016-07-04 DIAGNOSIS — Z7984 Long term (current) use of oral hypoglycemic drugs: Secondary | ICD-10-CM | POA: Insufficient documentation

## 2016-07-04 DIAGNOSIS — I5022 Chronic systolic (congestive) heart failure: Secondary | ICD-10-CM | POA: Diagnosis not present

## 2016-07-04 LAB — BASIC METABOLIC PANEL
Anion gap: 8 (ref 5–15)
BUN: 21 mg/dL — ABNORMAL HIGH (ref 6–20)
CALCIUM: 9.7 mg/dL (ref 8.9–10.3)
CO2: 26 mmol/L (ref 22–32)
CREATININE: 1.02 mg/dL (ref 0.61–1.24)
Chloride: 101 mmol/L (ref 101–111)
GFR calc non Af Amer: >60 mL/min (ref >60)
Glucose, Bld: 328 mg/dL — ABNORMAL HIGH (ref 65–99)
Potassium: 4.4 mmol/L (ref 3.5–5.1)
Sodium: 135 mmol/L (ref 135–145)

## 2016-07-04 NOTE — Patient Instructions (Signed)
Stop Isosorbide  Lab today  Labs in 3 months  We will contact you in 6 months to schedule your next appointment.

## 2016-07-06 NOTE — Progress Notes (Signed)
PCP: Dr. Neita Carp EP: Dr. Elberta Fortis HF Cardiology: Dr. Shirlee Latch  46 yo with history of chronic systolic CHF/nonischemic cardiomyopathy, frequent PVCs/NSVT, and sinus node dysfunction s/p pacemaker presents for followup.  At initial appt, he said that for about 6 months, he had had exertional dyspnea . 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   In 6/17, he was admitted with chest pain and exertional dyspnea.  There was concern for unstable angina.  LHC showed no obstructive CAD, but echo showed EF 30-35%.  He was diuresed.  He was noted to be bradycardic with HR in 40s and Coreg was held.    He was admitted again in 7/17 with chest pain and PVCs. HR in 30s at times, Coreg again stopped.  PVCs were noted to be very frequent, 19% on total beats on holter from 7/17.   In 8/17, he had a PVC ablation but PVCs recurred afterwards.  He was still bradycardic and had a Medtronic dual chamber PPM placed.  Repeat holter in 11/17 showed improvement in PVCs, <1% of total beats.   He is doing better.  Weight down 5 lbs.  Repeat echo in 3/18 showed EF up to 50-55%, and CPX showed no significant HF limitation.  No dyspnea walking on flat ground.  Mild dyspnea with stairs.  No chest pain. He stopped atorvastatin and leg weakness improved.     Labs (8/17): K 4.2, creatinine 1.22 Labs (11/17): K 4.2, creatinine 1.06, SPEP negative, BNP 32 Labs (4/18): K 4.5, creatinine 0.94  PMH: 1. Chronic systolic CHF: Nonischemic cardiomyopathy, ?due to PVCs versus prior myocarditis.  No ETOH/drugs.  Has strong family history of CAD but not of cardiomyopathy.   - LHC (6/17): 30% mid RCA, no other significant disease.  - Echo (6/17): EF 30-35%.  - Holter (7/17): 19% PVCs.  - Echo (3/18): EF 50-55%, moderate LVH, grade II diastolic dysfunction.  - CPX (3/18): submaximal, RER 0.99, VO2 20.8 (89% predicted), VE/VCO2 29  => no significant HF limitation.  2. Morbid obesity 3. Type II diabetes 4. Hypertriglyceridemia 5. NSVT/PVCs:  - 48 hour holter (7/17) with 19% PVCs. - PVC ablation in 8/17 but PVCs recurred.  - Holter (11/17) with rare PVCs, < 1%.  6. Bradycardia: Sinus node dysfunction.  Dual chamber pacemaker placed (Medtronic, MRI compatible).  7. 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  8. ABIs (3/18): Normal 9. Leg weakness with atorvastatin.   SH: Married, lives in 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   FH: Father with CHF (?cause, history of valve surgery), brother with CAD, uncles with MIs.  ROS: All systems reviewed and negative except as per HPI.  Current Outpatient Prescriptions  Medication Sig Dispense Refill  . aspirin EC 81 MG EC tablet Take 1 tablet (81 mg total) by mouth daily.    Marland Kitchen FLUoxetine (PROZAC) 20 MG tablet Take 20 mg by mouth daily.    . metFORMIN (GLUCOPHAGE) 1000 MG tablet Take 1 tablet (1,000 mg total) by mouth daily after supper. 30 tablet 6  . metoprolol succinate (TOPROL-XL) 50 MG 24 hr tablet Take 1 tablet (50 mg total) by mouth daily. Take with or immediately following a meal. 30 tablet 3  . omega-3 acid ethyl esters (LOVAZA) 1 g capsule Take 2 capsules (2 g total) by mouth 2 (two) times daily. 120 capsule 6  .  sacubitril-valsartan (ENTRESTO) 49-51 MG Take 1 tablet by mouth 2 (two) times daily. 60 tablet 11  . spironolactone (ALDACTONE) 25 MG tablet Take 0.5 tablets (12.5 mg total) by mouth daily. 45 tablet 3  . furosemide (LASIX) 40 MG tablet Take 1 tablet (40 mg total) by mouth daily as needed for fluid. (Patient not taking: Reported on 07/04/2016)    . nitroGLYCERIN (NITROSTAT) 0.4 MG SL tablet Place 0.4 mg under the tongue every 5 (five) minutes as needed for chest pain (3 DOSES MAX).     No current facility-administered medications for this encounter.    BP 108/71   Pulse 84   Wt (!) 328 lb 12 oz (149.1 kg)   SpO2 98%    BMI 42.21 kg/m  General: NAD, obese Neck: Thick, JVP not elevated, no thyromegaly or thyroid nodule.  Lungs: CTAB CV: Nondisplaced PMI.  Heart regular S1/S2, no S3/S4, no murmur.  No edema.  No carotid bruit.  Unable to palpate pedal pulses.  Abdomen: Soft, nontender, no hepatosplenomegaly, no distention.  Skin: Intact without lesions or rashes.  Neurologic: Alert and oriented x 3.  Psych: Normal affect. Extremities: No clubbing or cyanosis.  HEENT: Normal.   Assessment/Plan: 1. Chronic systolic CHF: Nonischemic cardiomyopathy.  EF 30-35% on 6/17 echo.  LHC (6/17) with no significant CAD.  Holter 7/17 with 19% PVCs, ?PVC cardiomyopathy => improved to < 1% on 11/17 holter.  Also could be related to myocarditis.  No heavy ETOH/drugs.  Has strong family history of CAD but not nonischemic cardiomyopathy.  He only rarely paces the RV.  Repeat echo in 3/18 with EF improved to 50-55%, CPX in 3/18 without significant HF limitation.  NYHA class II symptoms.  Not volume overloaded on exam.  - I do not think that he needs Lasix and think that he can stop Imdur.  - Continue Toprol XL 50 daily.   - Continue Entresto 49/51 bid and spironolactone 12.5 mg daily, check BMET today.  - EF improved, no ICD.   2. PVCs/NSVT: 19% PVCs on 7/17 holter.  ?PVC-mediated CMP.  When he does not take Toprol XL, he feels more palpitations.  He did have a PVC ablation in 8/17.  Most recent holter in 11/17 showed PVCs down to <1%.  It is possible that decrease in PVCs may have contributed to improved EF.   - Continue Toprol XL.   - Followup with Dr Elberta Fortis.  3. Chest pain: Atypical, LHC 6/17 without significant disease.  Doubt ischemic pain.  4. Sinus bradycardia: S/p dual chamber Medtronic PPM, he a-paces but only rarely paces RV.    5. Leg fatigue/weakness: Normal ABIs, improved off atorvastatin.   6. Obesity: He is losing weight, continue efforts.   Repeat BMET in 3 months, return to clinic in 6 months.   Marca Ancona 07/06/2016

## 2016-08-14 ENCOUNTER — Telehealth (HOSPITAL_COMMUNITY): Payer: Self-pay

## 2016-08-14 NOTE — Telephone Encounter (Signed)
Patient c/o pinpoint pain under left lower rib for past couple of weeks.  Constant, sharp, does not get better or worse related to anything. Weight stable, not SOB, no swelling or any other CHF related s.s. Advised to call PCP to advise.  Ave Filter, RN

## 2016-09-12 ENCOUNTER — Telehealth (HOSPITAL_COMMUNITY): Payer: Self-pay

## 2016-09-12 ENCOUNTER — Encounter (HOSPITAL_COMMUNITY): Payer: BLUE CROSS/BLUE SHIELD

## 2016-09-12 NOTE — Telephone Encounter (Signed)
Spoke with wife to schedule apt with CHF clinic at 12 noon today.  Wife states husband is at work but will take apt and call us back if they cannot make apt and need to reschedule.  Advised again if symptoms worsen before he is seen here to go to ED. Aware and agreeable.  Ave Filter, RN

## 2016-09-12 NOTE — Telephone Encounter (Signed)
Patient's wife called CHF clinic during epic charting downtime to report husband feeling bad "has been great from last apt up until last week, now all of his bad symptoms are back". Reports weight pretty stable, however very fatigued, SOB, sweating, and gives out easy. Would like to be seen before the weekend as they fear he will end up in hospital. Will discuss with Otilio Saber PA-C to advise. Advised if patient becomes worse before call back to report to ED.  Ave Filter, RN

## 2016-09-12 NOTE — Telephone Encounter (Signed)
Can come at 12, vs in for labs/nursing visit with vitals and EKG.    Casimiro Needle [address]" [place], PA-C 09/12/2016 9:12 AM

## 2016-09-16 ENCOUNTER — Ambulatory Visit (HOSPITAL_COMMUNITY)
Admission: RE | Admit: 2016-09-16 | Discharge: 2016-09-16 | Disposition: A | Discharge status: Home / Self Care | Payer: BLUE CROSS/BLUE SHIELD | Source: Ambulatory Visit | Attending: Internal Medicine | Admitting: Internal Medicine

## 2016-09-16 ENCOUNTER — Telehealth (HOSPITAL_COMMUNITY): Payer: Self-pay | Admitting: Pharmacist

## 2016-09-16 ENCOUNTER — Encounter (HOSPITAL_COMMUNITY): Payer: Self-pay

## 2016-09-16 VITALS — Wt 331.0 lb

## 2016-09-16 DIAGNOSIS — E119 Type 2 diabetes mellitus without complications: Secondary | ICD-10-CM | POA: Insufficient documentation

## 2016-09-16 DIAGNOSIS — G4733 Obstructive sleep apnea (adult) (pediatric): Secondary | ICD-10-CM | POA: Diagnosis not present

## 2016-09-16 DIAGNOSIS — Z7982 Long term (current) use of aspirin: Secondary | ICD-10-CM | POA: Diagnosis not present

## 2016-09-16 DIAGNOSIS — I5022 Chronic systolic (congestive) heart failure: Secondary | ICD-10-CM | POA: Diagnosis not present

## 2016-09-16 DIAGNOSIS — Z8249 Family history of ischemic heart disease and other diseases of the circulatory system: Secondary | ICD-10-CM | POA: Insufficient documentation

## 2016-09-16 DIAGNOSIS — Z6841 Body Mass Index (BMI) 40.0 and over, adult: Secondary | ICD-10-CM | POA: Diagnosis not present

## 2016-09-16 DIAGNOSIS — R001 Bradycardia, unspecified: Secondary | ICD-10-CM | POA: Insufficient documentation

## 2016-09-16 DIAGNOSIS — Z7984 Long term (current) use of oral hypoglycemic drugs: Secondary | ICD-10-CM | POA: Diagnosis not present

## 2016-09-16 DIAGNOSIS — Z950 Presence of cardiac pacemaker: Secondary | ICD-10-CM | POA: Insufficient documentation

## 2016-09-16 DIAGNOSIS — I5042 Chronic combined systolic (congestive) and diastolic (congestive) heart failure: Secondary | ICD-10-CM

## 2016-09-16 DIAGNOSIS — F419 Anxiety disorder, unspecified: Secondary | ICD-10-CM

## 2016-09-16 DIAGNOSIS — I429 Cardiomyopathy, unspecified: Secondary | ICD-10-CM | POA: Diagnosis not present

## 2016-09-16 DIAGNOSIS — I428 Other cardiomyopathies: Secondary | ICD-10-CM

## 2016-09-16 DIAGNOSIS — I493 Ventricular premature depolarization: Secondary | ICD-10-CM | POA: Diagnosis not present

## 2016-09-16 DIAGNOSIS — Z79899 Other long term (current) drug therapy: Secondary | ICD-10-CM | POA: Diagnosis not present

## 2016-09-16 DIAGNOSIS — I25119 Atherosclerotic heart disease of native coronary artery with unspecified angina pectoris: Secondary | ICD-10-CM | POA: Diagnosis not present

## 2016-09-16 LAB — BASIC METABOLIC PANEL
Anion gap: 8 (ref 5–15)
BUN: 15 mg/dL (ref 6–20)
CHLORIDE: 101 mmol/L (ref 101–111)
CO2: 26 mmol/L (ref 22–32)
CREATININE: 0.87 mg/dL (ref 0.61–1.24)
Calcium: 9.4 mg/dL (ref 8.9–10.3)
Glucose, Bld: 292 mg/dL — ABNORMAL HIGH (ref 65–99)
Potassium: 4.7 mmol/L (ref 3.5–5.1)
SODIUM: 135 mmol/L (ref 135–145)

## 2016-09-16 LAB — BRAIN NATRIURETIC PEPTIDE: B NATRIURETIC PEPTIDE 5: 58.9 pg/mL (ref 0.0–100.0)

## 2016-09-16 MED ORDER — SACUBITRIL-VALSARTAN 24-26 MG PO TABS: 1.0000 | ORAL_TABLET | Freq: Two times a day (BID) | ORAL | 3 refills | Status: DC

## 2016-09-16 MED ORDER — SACUBITRIL-VALSARTAN 24-26 MG PO TABS: 1.0000 | ORAL_TABLET | Freq: Two times a day (BID) | ORAL | 3 refills | Status: AC

## 2016-09-16 NOTE — Progress Notes (Signed)
PCP: Dr. Quintin Alto EP: Dr. Curt Bears HF Cardiology: Dr. Aundra Dubin  46 yo with history of chronic systolic CHF/nonischemic cardiomyopathy, frequent PVCs/NSVT, and sinus node dysfunction s/p pacemaker presents for followup.  At initial appt, he said that for about 6 months, he had had exertional dyspnea . 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  Instead, he now does estimations for a Forensic scientist.   In 6/17, he was admitted with chest pain and exertional dyspnea.  There was concern for unstable angina.  LHC showed no obstructive CAD, but echo showed EF 30-35%.  He was diuresed.  He was noted to be bradycardic with HR in 40s and Coreg was held.    He was admitted again in 7/17 with chest pain and PVCs. HR in 30s at times, Coreg again stopped.  PVCs were noted to be very frequent, 19% on total beats on holter from 7/17.   In 8/17, he had a PVC ablation but PVCs recurred afterwards.  He was still bradycardic and had a Medtronic dual chamber PPM placed.  Repeat holter in 11/17 showed improvement in PVCs, <1% of total beats.   Returns today for HF follow up. Just as we started the visit, he got a phone call that his sister died. He was visibly distraught. He was originally scheduled for a nurse visit for increased SOB and fatigue. He says that he feel more tired than usual. EKG shows NSR with atrial pacing. Orthostatic vital signs were positive with a systolic drop of 26 points. His weights at home are stable at 229-231 pounds. He is eating well, drinking less than 2L a day. He says that he has noticed increased palpitations in the past 2-3 weeks, feels like PVC's.     ECG - A paced, no acute ST abnormalities. Personally reviewed.   Labs (8/17): K 4.2, creatinine 1.22 Labs (11/17): K 4.2, creatinine 1.06, SPEP negative, BNP 32  PMH: 1. Chronic systolic CHF: Nonischemic cardiomyopathy, ?due to PVCs versus prior myocarditis.  No ETOH/drugs.  Has  strong family history of CAD but not of cardiomyopathy.   - LHC (6/17): 30% mid RCA, no other significant disease.  - Echo (6/17): EF 30-35%.  - Holter (7/17): 19% PVCs.  2. Morbid obesity 3. Type II diabetes 4. Hypertriglyceridemia 5. NSVT/PVCs:  - 48 hour holter (7/17) with 19% PVCs. - PVC ablation in 8/17 but PVCs recurred.  - Holter (11/17) with rare PVCs, < 1%.  6. Bradycardia: Sinus node dysfunction.  Dual chamber pacemaker placed (Medtronic, MRI compatible).  7. 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   FH: Father with CHF (?cause, history of valve surgery), brother with CAD, uncles with MIs.  ROS: All systems reviewed and negative except as per HPI.  Current Outpatient Prescriptions  Medication Sig Dispense Refill  . aspirin EC 81 MG EC tablet Take 1 tablet (81 mg total) by mouth daily.    Marland Kitchen FLUoxetine (PROZAC) 20 MG tablet Take 20 mg by mouth daily.    . furosemide (LASIX) 40 MG tablet Take 1 tablet (40 mg total) by mouth daily as needed for fluid.    . metFORMIN (GLUCOPHAGE) 1000 MG tablet Take 1 tablet (1,000 mg total) by mouth daily after supper. 30 tablet 6  . metoprolol succinate (TOPROL-XL) 50 MG 24 hr tablet Take 1 tablet (50 mg  total) by mouth daily. Take with or immediately following a meal. 30 tablet 3  . nitroGLYCERIN (NITROSTAT) 0.4 MG SL tablet Place 0.4 mg under the tongue every 5 (five) minutes as needed for chest pain (3 DOSES MAX).    Marland Kitchen omega-3 acid ethyl esters (LOVAZA) 1 g capsule Take 2 capsules (2 g total) by mouth 2 (two) times daily. 120 capsule 6  . sacubitril-valsartan (ENTRESTO) 49-51 MG Take 1 tablet by mouth 2 (two) times daily. 60 tablet 11  . spironolactone (ALDACTONE) 25 MG tablet Take 0.5 tablets (12.5 mg total) by mouth daily. 45 tablet 3   No current facility-administered medications for this encounter.    Wt (!) 331 lb (150.1 kg) Comment:  Has work boots on  SpO2 98%   BMI 42.50 kg/m  General: Well appearing. No resp difficulty. HEENT: Normal Neck: Supple. JVP 5-6. Carotids 2+ bilat; no bruits. No thyromegaly or nodule noted. Cor: PMI nondisplaced. RRR, No M/G/R noted Lungs: CTAB, normal effort. Abdomen: Soft, non-tender, non-distended, no HSM. No bruits or masses. +BS  Extremities: No cyanosis, clubbing, rash, R and LLE no edema.  Neuro: Alert & orientedx3, cranial nerves grossly intact. moves all 4 extremities w/o difficulty. Affect pleasant   Assessment/Plan: 1. Chronic systolic CHF: Nonischemic cardiomyopathy.  EF 30-35% on 6/17 echo.  LHC (6/17) with no significant CAD.  Holter 7/17 with 19% PVCs, ?PVC cardiomyopathy => improved to < 1% on 11/17 holter.  Also could be related to myocarditis.  No heavy ETOH/drugs.  Has strong family history of CAD but not nonischemic cardiomyopathy.  - Volume status stable on exam.  - Since he is orthostatic, will hold Entresto for 2 days, then restart at lower dose 24/26 mg BID. Will check BMET today and in 10 days.  - Continue Continue Toprol XL 50 mg daily - Contine Spiro 12.5 mg hs.   - With worsening symptoms, will order repeat Echo asap.  2. PVCs/NSVT: 19% PVCs on 7/17 holter.  ?PVC-mediated CMP. He did have a PVC ablation in 8/17.  Most recent holter in 11/17 showed PVCs down to <1%.    - Will place 48  Hour monitor in case PVC's are now increased causing his increased HF symptoms.   3. Chest pain: LHC in 07/2015 without significant CAD.   4. Sinus bradycardia:  - s/p Medtronic dual chamber PPM.   5. Anxiety - Received some disturbing news that his sister died just prior to our visit. SW met with patient and offered emotional support.  Follow up with Dr. Haroldine Laws in 2 months. Will follow up on monitor and Echo. I have asked him to call our office if his symptoms do not improve.   Arbutus Leas 09/16/2016

## 2016-09-16 NOTE — Telephone Encounter (Signed)
Entresto 24-26 mg BID PA approved by [employer] commercial through 02/03/38.   Tyler Deis. Bonnye Fava, PharmD, BCPS, CPP Clinical Pharmacist Pager: [phone] Phone: [phone] 09/16/2016 11:33 AM

## 2016-09-16 NOTE — Patient Instructions (Addendum)
STOP Entresto for 2 days.  Then start new prescription for 24/26 mg tablets twice daily. You CANNOT half your current tablets.  Will schedule you for a holter monitor (heart monitor) to be placed at [facility] office. Address: [address] ([address]), [place], [place] [address]  Phone: [phone]  Will schedule you for an echocardiogram at [facility]. Address: [address]), [place], [place] [address]  Phone: [phone]  Follow up November. We will call you to schedule this.  Routine lab work today. Will notify you of abnormal results, otherwise no news is good news!  Take all medication as prescribed the day of your appointment. Bring all medications with you to your appointment.  Do the following things EVERYDAY: 1) Weigh yourself in the morning before breakfast. Write it down and keep it in a log. 2) Take your medicines as prescribed 3) Eat low salt foods-Limit salt (sodium) to 2000 mg per day.  4) Stay as active as you can everyday 5) Limit all fluids for the day to less than 2 liters

## 2016-09-25 ENCOUNTER — Other Ambulatory Visit: Payer: Self-pay | Admitting: Internal Medicine

## 2016-09-26 ENCOUNTER — Other Ambulatory Visit (HOSPITAL_COMMUNITY): Payer: BLUE CROSS/BLUE SHIELD

## 2016-10-14 ENCOUNTER — Telehealth (HOSPITAL_COMMUNITY): Payer: Self-pay | Admitting: *Deleted

## 2016-10-14 NOTE — Telephone Encounter (Signed)
Pts wife called pt is c/o rapid heart rate, "twinges" in chest, and overall color pale. Pt added to app clinic in the morning at 8:30 and advised if he started feeling worse to report to the emergency room. Per Denny Peon

## 2016-10-15 ENCOUNTER — Encounter (HOSPITAL_COMMUNITY): Payer: Self-pay

## 2016-10-15 ENCOUNTER — Telehealth (HOSPITAL_COMMUNITY): Payer: Self-pay | Admitting: Pharmacist

## 2016-10-15 ENCOUNTER — Ambulatory Visit (HOSPITAL_COMMUNITY)
Admission: RE | Admit: 2016-10-15 | Discharge: 2016-10-15 | Disposition: A | Discharge status: Home / Self Care | Payer: BLUE CROSS/BLUE SHIELD | Source: Ambulatory Visit | Attending: Cardiology | Admitting: Cardiology

## 2016-10-15 ENCOUNTER — Other Ambulatory Visit (HOSPITAL_COMMUNITY): Payer: Self-pay | Admitting: Pharmacist

## 2016-10-15 VITALS — BP 128/96 | HR 86 | Wt 323.0 lb

## 2016-10-15 DIAGNOSIS — Z950 Presence of cardiac pacemaker: Secondary | ICD-10-CM

## 2016-10-15 DIAGNOSIS — Z8249 Family history of ischemic heart disease and other diseases of the circulatory system: Secondary | ICD-10-CM | POA: Insufficient documentation

## 2016-10-15 DIAGNOSIS — I5022 Chronic systolic (congestive) heart failure: Secondary | ICD-10-CM | POA: Insufficient documentation

## 2016-10-15 DIAGNOSIS — Z7984 Long term (current) use of oral hypoglycemic drugs: Secondary | ICD-10-CM | POA: Insufficient documentation

## 2016-10-15 DIAGNOSIS — E781 Pure hyperglyceridemia: Secondary | ICD-10-CM | POA: Diagnosis not present

## 2016-10-15 DIAGNOSIS — G4733 Obstructive sleep apnea (adult) (pediatric): Secondary | ICD-10-CM | POA: Insufficient documentation

## 2016-10-15 DIAGNOSIS — I472 Ventricular tachycardia: Secondary | ICD-10-CM | POA: Diagnosis not present

## 2016-10-15 DIAGNOSIS — Z79899 Other long term (current) drug therapy: Secondary | ICD-10-CM | POA: Insufficient documentation

## 2016-10-15 DIAGNOSIS — Z7982 Long term (current) use of aspirin: Secondary | ICD-10-CM | POA: Diagnosis not present

## 2016-10-15 DIAGNOSIS — R072 Precordial pain: Secondary | ICD-10-CM | POA: Diagnosis not present

## 2016-10-15 DIAGNOSIS — E119 Type 2 diabetes mellitus without complications: Secondary | ICD-10-CM | POA: Diagnosis not present

## 2016-10-15 DIAGNOSIS — R079 Chest pain, unspecified: Secondary | ICD-10-CM | POA: Insufficient documentation

## 2016-10-15 DIAGNOSIS — I5042 Chronic combined systolic (congestive) and diastolic (congestive) heart failure: Secondary | ICD-10-CM | POA: Diagnosis not present

## 2016-10-15 DIAGNOSIS — I429 Cardiomyopathy, unspecified: Secondary | ICD-10-CM | POA: Insufficient documentation

## 2016-10-15 DIAGNOSIS — I428 Other cardiomyopathies: Secondary | ICD-10-CM

## 2016-10-15 DIAGNOSIS — I493 Ventricular premature depolarization: Secondary | ICD-10-CM | POA: Diagnosis not present

## 2016-10-15 LAB — BASIC METABOLIC PANEL
Anion gap: 6 (ref 5–15)
BUN: 21 mg/dL — AB (ref 6–20)
CO2: 25 mmol/L (ref 22–32)
CREATININE: 0.99 mg/dL (ref 0.61–1.24)
Calcium: 9.2 mg/dL (ref 8.9–10.3)
Chloride: 102 mmol/L (ref 101–111)
GFR calc Af Amer: >60 mL/min (ref >60)
Glucose, Bld: 293 mg/dL — ABNORMAL HIGH (ref 65–99)
Potassium: 4.1 mmol/L (ref 3.5–5.1)
SODIUM: 133 mmol/L — AB (ref 135–145)

## 2016-10-15 LAB — BRAIN NATRIURETIC PEPTIDE: B NATRIURETIC PEPTIDE 5: 17 pg/mL (ref 0.0–100.0)

## 2016-10-15 NOTE — Patient Instructions (Signed)
Labs today We will only contact you if something comes back abnormal or we need to make some changes. Otherwise no news is good news!  Your physician has requested that you have an echocardiogram. Echocardiography is a painless test that uses sound waves to create images of your heart. It provides your doctor with information about the size and shape of your heart and how well your heart's chambers and valves are working. This procedure takes approximately one hour. There are no restrictions for this procedure.  Your physician has recommended that you wear a holter monitor. Holter monitors are medical devices that record the heart's electrical activity. Doctors most often use these monitors to diagnose arrhythmias. Arrhythmias are problems with the speed or rhythm of the heartbeat. The monitor is a small, portable device. You can wear one while you do your normal daily activities. This is usually used to diagnose what is causing palpitations/syncope (passing out).  Your physician recommends that you schedule a follow-up appointment in: 2 months with Dr.McLean   Do the following things EVERYDAY: 1) Weigh yourself in the morning before breakfast. Write it down and keep it in a log. 2) Take your medicines as prescribed 3) Eat low salt foods-Limit salt (sodium) to 2000 mg per day.  4) Stay as active as you can everyday 5) Limit all fluids for the day to less than 2 liters

## 2016-10-15 NOTE — Telephone Encounter (Signed)
Rx for Entresto 24-26 mg BID was sent to [facility] on 09/16/16 so he needs to call them ([phone]) to see when they will be sent to him if he has not received his latest shipment yet. Also, once his approval has expired, he will no longer be eligible for patient assistance since he now has Nurse, learning disability.    Tyler Deis. Bonnye Fava, PharmD, BCPS, CPP Clinical Pharmacist Pager: [phone] Phone: [phone] 10/15/2016 1:17 PM

## 2016-10-15 NOTE — Progress Notes (Signed)
PCP: Dr. Neita Carp EP: Dr. Elberta Fortis HF Cardiology: Dr. Shirlee Latch  46 yo male with history of chronic systolic CHF/nonischemic cardiomyopathy, frequent PVCs/NSVT, and sinus node dysfunction s/p pacemaker presents for followup.  At initial appt, he said that for about 6 months, he had had exertional dyspnea . 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   In 6/17, he was admitted with chest pain and exertional dyspnea.  There was concern for unstable angina.  LHC showed no obstructive CAD, but echo showed EF 30-35%.  He was diuresed.  He was noted to be bradycardic with HR in 40s and Coreg was held.    He was admitted again in 7/17 with chest pain and PVCs. HR in 30s at times, Coreg again stopped.  PVCs were noted to be very frequent, 19% on total beats on holter from 7/17.   In 8/17, he had a PVC ablation but PVCs recurred afterwards.  He was still bradycardic and had a Medtronic dual chamber PPM placed.  Repeat holter in 11/17 showed improvement in PVCs, <1% of total beats.   He returns today for follow up. He called and requested to be seen sooner as he has not felt well for 2 days. Yesterday, he felt weak and more tired. His watch noted that his heart rate was in the 65-70 at rest and 95-100 when he got up and walked or did things around the house. I asked him why this concerned him and he said because he felt weak and slightly diaphoretic at the time. He denies chest pain, palpitations. Says that he used to be able to work 60-70 hours a week without fatigue, but now feels fatigued after working that much. Denies SOB with activity. His weight is stable and trending down at home.     ECG: Atrial paced, No acute ST/T wave changes.  Medtronic Optivol: Activity > 8 hours a day. No AT/AF.   Labs (8/17): K 4.2, creatinine 1.22 Labs (11/17): K 4.2, creatinine 1.06, SPEP negative, BNP  32  PMH: 1. Chronic systolic CHF: Nonischemic cardiomyopathy, ?due to PVCs versus prior myocarditis.  No ETOH/drugs.  Has strong family history of CAD but not of cardiomyopathy.   - LHC (6/17): 30% mid RCA, no other significant disease.  - Echo (6/17): EF 30-35%.  - Holter (7/17): 19% PVCs.  2. Morbid obesity 3. Type II diabetes 4. Hypertriglyceridemia 5. NSVT/PVCs:  - 48 hour holter (7/17) with 19% PVCs. - PVC ablation in 8/17 but PVCs recurred.  - Holter (11/17) with rare PVCs, < 1%.  6. Bradycardia: Sinus node dysfunction.  Dual chamber pacemaker placed (Medtronic, MRI compatible).  7. 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   FH: Father with CHF (?cause, history of valve surgery), brother with CAD, uncles with MIs.  ROS: All systems reviewed and negative except as per HPI.  Current Outpatient Prescriptions  Medication Sig Dispense Refill  . aspirin EC 81 MG EC tablet Take 1 tablet (81 mg total) by mouth daily.    Marland Kitchen FLUoxetine (PROZAC) 20 MG tablet Take 20 mg by mouth daily.    . furosemide (LASIX) 40 MG tablet Take 1 tablet (40 mg total) by mouth daily as needed for fluid.    . metFORMIN (GLUCOPHAGE)  1000 MG tablet Take 1 tablet (1,000 mg total) by mouth daily after supper. 30 tablet 6  . metoprolol succinate (TOPROL-XL) 50 MG 24 hr tablet Take 1 tablet (50 mg total) by mouth daily. Take with or immediately following a meal. 30 tablet 3  . omega-3 acid ethyl esters (LOVAZA) 1 g capsule Take 2 capsules (2 g total) by mouth 2 (two) times daily. 120 capsule 6  . sacubitril-valsartan (ENTRESTO) 24-26 MG Take 1 tablet by mouth 2 (two) times daily. 180 tablet 3  . spironolactone (ALDACTONE) 25 MG tablet Take 0.5 tablets (12.5 mg total) by mouth daily. 45 tablet 3  . nitroGLYCERIN (NITROSTAT) 0.4 MG SL tablet Place 0.4 mg under the tongue every 5 (five) minutes as needed for chest pain  (3 DOSES MAX).     No current facility-administered medications for this encounter.    BP (!) 128/96 (BP Location: Right Arm, Patient Position: Sitting, Cuff Size: Large)   Pulse 86   Wt (!) 323 lb (146.5 kg)   SpO2 98%   BMI 41.47 kg/m   General: Well appearing. No resp difficulty. HEENT: Normal Neck: Supple. JVP 5-6. Carotids 2+ bilat; no bruits. No thyromegaly or nodule noted. Cor: PMI nondisplaced. RRR, No M/G/R noted Lungs: CTAB, normal effort. Abdomen: Obese, soft, non-tender, non-distended, no HSM. No bruits or masses. +BS  Extremities: No cyanosis, clubbing, rash, R and LLE no edema.  Neuro: Alert & orientedx3, cranial nerves grossly intact. moves all 4 extremities w/o difficulty. Affect pleasant    Assessment/Plan: 1. Chronic systolic CHF: Nonischemic cardiomyopathy.  EF 30-35% on 6/17 echo.  LHC (6/17) with no significant CAD.  Holter 7/17 with 19% PVCs, ?PVC cardiomyopathy => improved to < 1% on 11/17 holter.  Also could be related to myocarditis.  No heavy ETOH/drugs.  Has strong family history of CAD but not nonischemic cardiomyopathy.  - NYHA II - Volume stable on exam - Orthostatic vitals are negative. Continue Entresto 24/26 mg BID.  - Only taking lasix prn, continue as needed. He has not needed it in months.  - Continue Spiro 12.5 mg hs.  - With worsening symptoms, will order Echo to see if EF is down. Discussed with Dr. Shirlee Latch, will order 48 hour monitor to see if PVC burden in high again.   2. PVCs/NSVT: 19% PVCs on 7/17 holter.  ?PVC-mediated CMP. He did have a PVC ablation in 8/17.  Most recent holter in 11/17 showed PVCs down to <1%.    - As above, will place monitor to see if PVC burden is high again.   3. Chest pain:  - Denies chest pain. Has family history of CAD. LHC in 07/2015 without significant CAD.   4. Sinus bradycardia:  - s/p Medtronic PPM.   BMET, BNP today. I discussed with Dr. Shirlee Latch, the patient's fatigue may be from working 60-70 hours a  week. I advised him to rest, call office if he develops SOB, chest pain, palpitations. Follow up with Dr. Shirlee Latch in 2 months.    Matthew Sexton 10/15/2016

## 2016-10-23 ENCOUNTER — Other Ambulatory Visit: Payer: Self-pay | Admitting: Cardiology

## 2016-10-29 ENCOUNTER — Other Ambulatory Visit (HOSPITAL_COMMUNITY): Payer: Self-pay | Admitting: Cardiology

## 2016-10-29 ENCOUNTER — Ambulatory Visit (INDEPENDENT_AMBULATORY_CARE_PROVIDER_SITE_OTHER): Payer: BLUE CROSS/BLUE SHIELD

## 2016-10-29 ENCOUNTER — Other Ambulatory Visit: Payer: Self-pay

## 2016-10-29 ENCOUNTER — Ambulatory Visit (HOSPITAL_COMMUNITY): Payer: BLUE CROSS/BLUE SHIELD | Attending: Cardiology

## 2016-10-29 DIAGNOSIS — I5042 Chronic combined systolic (congestive) and diastolic (congestive) heart failure: Secondary | ICD-10-CM

## 2016-10-29 DIAGNOSIS — R5383 Other fatigue: Secondary | ICD-10-CM

## 2016-10-29 DIAGNOSIS — I493 Ventricular premature depolarization: Secondary | ICD-10-CM | POA: Diagnosis not present

## 2016-10-29 DIAGNOSIS — R072 Precordial pain: Secondary | ICD-10-CM

## 2016-10-29 DIAGNOSIS — I429 Cardiomyopathy, unspecified: Secondary | ICD-10-CM | POA: Insufficient documentation

## 2016-10-29 DIAGNOSIS — E119 Type 2 diabetes mellitus without complications: Secondary | ICD-10-CM | POA: Insufficient documentation

## 2016-10-29 MED ORDER — PERFLUTREN LIPID MICROSPHERE
1.0000 mL | INTRAVENOUS | Status: AC | PRN
  Administered 2016-10-29: 1 mL via INTRAVENOUS

## 2016-11-15 ENCOUNTER — Telehealth: Payer: Self-pay | Admitting: *Deleted

## 2016-11-15 NOTE — Telephone Encounter (Signed)
Received notification from Dr. Althea Grimmer that patient has declined an oral appliance for snoring/OSA.

## 2016-12-16 ENCOUNTER — Encounter (HOSPITAL_COMMUNITY): Payer: Self-pay | Admitting: Cardiology

## 2016-12-16 ENCOUNTER — Ambulatory Visit (HOSPITAL_COMMUNITY)
Admission: RE | Admit: 2016-12-16 | Discharge: 2016-12-16 | Disposition: A | Discharge status: Home / Self Care | Payer: BLUE CROSS/BLUE SHIELD | Source: Ambulatory Visit | Attending: Cardiology | Admitting: Cardiology

## 2016-12-16 VITALS — BP 144/78 | HR 84 | Wt 325.0 lb

## 2016-12-16 DIAGNOSIS — R001 Bradycardia, unspecified: Secondary | ICD-10-CM | POA: Diagnosis not present

## 2016-12-16 DIAGNOSIS — Z7984 Long term (current) use of oral hypoglycemic drugs: Secondary | ICD-10-CM | POA: Insufficient documentation

## 2016-12-16 DIAGNOSIS — G4733 Obstructive sleep apnea (adult) (pediatric): Secondary | ICD-10-CM | POA: Insufficient documentation

## 2016-12-16 DIAGNOSIS — E119 Type 2 diabetes mellitus without complications: Secondary | ICD-10-CM | POA: Insufficient documentation

## 2016-12-16 DIAGNOSIS — I493 Ventricular premature depolarization: Secondary | ICD-10-CM

## 2016-12-16 DIAGNOSIS — Z7982 Long term (current) use of aspirin: Secondary | ICD-10-CM | POA: Diagnosis not present

## 2016-12-16 DIAGNOSIS — Z8249 Family history of ischemic heart disease and other diseases of the circulatory system: Secondary | ICD-10-CM | POA: Diagnosis not present

## 2016-12-16 DIAGNOSIS — Z79899 Other long term (current) drug therapy: Secondary | ICD-10-CM | POA: Insufficient documentation

## 2016-12-16 DIAGNOSIS — I110 Hypertensive heart disease with heart failure: Secondary | ICD-10-CM | POA: Diagnosis not present

## 2016-12-16 DIAGNOSIS — I5042 Chronic combined systolic (congestive) and diastolic (congestive) heart failure: Secondary | ICD-10-CM

## 2016-12-16 LAB — BASIC METABOLIC PANEL
Anion gap: 11 (ref 5–15)
BUN: 18 mg/dL (ref 6–20)
CHLORIDE: 98 mmol/L — AB (ref 101–111)
CO2: 25 mmol/L (ref 22–32)
Calcium: 9.6 mg/dL (ref 8.9–10.3)
Creatinine, Ser: 1.11 mg/dL (ref 0.61–1.24)
GFR calc Af Amer: >60 mL/min (ref >60)
GFR calc non Af Amer: >60 mL/min (ref >60)
GLUCOSE: 419 mg/dL — AB (ref 65–99)
POTASSIUM: 4.4 mmol/L (ref 3.5–5.1)
SODIUM: 134 mmol/L — AB (ref 135–145)

## 2016-12-16 MED ORDER — METOPROLOL SUCCINATE ER 50 MG PO TB24: 50.0000 mg | ORAL_TABLET | Freq: Every day | ORAL | 11 refills | Status: AC

## 2016-12-16 NOTE — Patient Instructions (Signed)
Start Toprol XL 50mg  daily.  Routine lab work today. Will notify you of abnormal results  Follow up in 4 months with Dr.McLean

## 2016-12-16 NOTE — Progress Notes (Signed)
PCP: Dr. Neita CarpSasser EP: Dr. Elberta Fortisamnitz HF Cardiology: Dr. Shirlee LatchMcLean  46 y.o. with history of chronic systolic CHF/nonischemic cardiomyopathy, frequent PVCs/NSVT, and sinus node dysfunction s/p pacemaker presents for followup of CHF and PVCs.  At initial appt, he said that for about 6 months, he had had exertional dyspnea . 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   In 6/17, he was admitted with chest pain and exertional dyspnea.  There was concern for unstable angina.  LHC showed no obstructive CAD, but echo showed EF 30-35%.  He was diuresed.  He was noted to be bradycardic with HR in 40s and Coreg was held.    He was admitted again in 7/17 with chest pain and PVCs. HR in 30s at times, Coreg again stopped.  PVCs were noted to be very frequent, 19% on total beats on holter from 7/17.   In 8/17, he had a PVC ablation but PVCs recurred afterwards.  He was still bradycardic and had a Medtronic dual chamber PPM placed.  Repeat holter in 11/17 showed improvement in PVCs, <1% of total beats.   Repeat echo in 3/18 and again in 9/18 showed EF up to 50-55%, and CPX showed no significant HF limitation.  Holter in 9/18 showed only rare PVCs.  Patient is doing well symptomatically.  He has changed jobs and is now working for [employer] organization and will do a fair amount of foreign travel.  No dyspnea on flat ground, mild dyspnea with stairs.  Weight is up 2 lbs.  No chest pain.  No orthopnea/PND.  Does not feel palpitations.  He is off Toprol XL but not sure why.   Labs (8/17): K 4.2, creatinine 1.22 Labs (11/17): K 4.2, creatinine 1.06, SPEP negative, BNP 32 Labs (4/18): K 4.5, creatinine 0.94 Labs (9/18): K 4.1, creatinine 0.99, BNP 17  PMH: 1. Chronic systolic CHF: Nonischemic cardiomyopathy, ?due to PVCs versus prior myocarditis.  No ETOH/drugs.  Has strong family history of CAD but not of  cardiomyopathy.   - LHC (6/17): 30% mid RCA, no other significant disease.  - Echo (6/17): EF 30-35%.  - Holter (7/17): 19% PVCs.  - Echo (3/18): EF 50-55%, moderate LVH, grade II diastolic dysfunction.  - CPX (3/18): submaximal, RER 0.99, VO2 20.8 (89% predicted), VE/VCO2 29 => no significant HF limitation.  - Echo (9/18): EF 50-55% 2. Morbid obesity 3. Type II diabetes 4. Hypertriglyceridemia 5. NSVT/PVCs:  - 48 hour holter (7/17) with 19% PVCs. - PVC ablation in 8/17 but PVCs recurred.  - Holter (11/17) with rare PVCs, < 1%.  - Holter (9/18) with rare PVCs only 6. Bradycardia: Sinus node dysfunction.  Dual chamber pacemaker placed (Medtronic, MRI compatible).  7. 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  8. ABIs (3/18): Normal 9. Leg weakness with atorvastatin.   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   FH: Father with CHF (?cause, history of valve surgery), brother with CAD, uncles with MIs.  ROS: All systems reviewed and negative except as per HPI.  Current Outpatient Medications  Medication Sig Dispense Refill  . aspirin EC 81 MG EC tablet Take 1 tablet (81 mg total) by mouth daily.    Marland Kitchen. FLUoxetine (PROZAC) 20 MG tablet Take 20 mg by mouth daily.    . metFORMIN (GLUCOPHAGE) 1000 MG tablet Take 1 tablet (1,000 mg total)  by mouth daily after supper. 30 tablet 6  . omega-3 acid ethyl esters (LOVAZA) 1 g capsule Take 2 capsules (2 g total) by mouth 2 (two) times daily. 120 capsule 6  . sacubitril-valsartan (ENTRESTO) 24-26 MG Take 1 tablet by mouth 2 (two) times daily. 180 tablet 3  . spironolactone (ALDACTONE) 25 MG tablet Take 0.5 tablets (12.5 mg total) by mouth daily. 45 tablet 3  . furosemide (LASIX) 40 MG tablet Take 1 tablet (40 mg total) by mouth daily as needed for fluid. (Patient not taking: Reported on 12/16/2016)    . metoprolol succinate (TOPROL XL) 50 MG 24 hr tablet Take 1 tablet (50 mg total) daily by mouth. Take with or  immediately following a meal. 30 tablet 11  . nitroGLYCERIN (NITROSTAT) 0.4 MG SL tablet Place 0.4 mg under the tongue every 5 (five) minutes as needed for chest pain (3 DOSES MAX).     No current facility-administered medications for this encounter.    BP (!) 144/78   Pulse 84   Wt (!) 325 lb (147.4 kg)   SpO2 96%   BMI 41.73 kg/m  General: NAD, obese Neck: Thick, no JVD, no thyromegaly or thyroid nodule.  Lungs: Clear to auscultation bilaterally with normal respiratory effort. CV: Nondisplaced PMI.  Heart regular S1/S2, no S3/S4, no murmur.  No peripheral edema.  No carotid bruit.  Normal pedal pulses.  Abdomen: Soft, nontender, no hepatosplenomegaly, no distention.  Skin: Intact without lesions or rashes.  Neurologic: Alert and oriented x 3.  Psych: Normal affect. Extremities: No clubbing or cyanosis.  HEENT: Normal.   Assessment/Plan: 1. Chronic systolic CHF: Nonischemic cardiomyopathy.  EF 30-35% on 6/17 echo.  LHC (6/17) with no significant CAD.  Holter 7/17 with 19% PVCs, ?PVC cardiomyopathy => improved to < 1% on 11/17 holter.  Also could be related to myocarditis.  No heavy ETOH/drugs.  Has strong family history of CAD but not nonischemic cardiomyopathy.  Repeat echo in 3/18 and again in 9/18 with EF improved to 50-55%, CPX in 3/18 without significant HF limitation.  NYHA class II symptoms.  Not volume overloaded on exam.   - Continue to use Lasix only prn.   - Restart Toprol XL 50 mg daily with cardiomyopathy and h/o frequent PVCs.   - Continue Entresto 24/26 bid and spironolactone 12.5 mg daily, check BMET today.  - EF improved, no ICD.   2. PVCs/NSVT: 19% PVCs on 7/17 holter.  ?PVC-mediated CMP.  He did have a PVC ablation in 8/17.  Most recent holter in 9/18 showed only few PVCs.  It is possible that decrease in PVCs may have contributed to improved EF.   - Restart Toprol XL.   - Followup with Dr Elberta Fortis.  3. Sinus bradycardia: S/p dual chamber Medtronic PPM, he a-paces  but only rarely paces RV.    4. Obesity: He needs to continue to work on weight loss => diet/exercise.  5. HTN: Restart Toprol XL.    Followup 4 months.   Marca Ancona 12/16/2016

## 2016-12-18 ENCOUNTER — Telehealth (HOSPITAL_COMMUNITY): Payer: Self-pay | Admitting: *Deleted

## 2016-12-18 NOTE — Telephone Encounter (Signed)
Result Notes for Basic metabolic panel   Notes recorded by Georgina Peer, RN on 12/18/2016 at 1:31 PM EST Patient called back and he is aware and verbalized he would contact his PCP. ------  Notes recorded by Teresa Coombs, RN on 12/18/2016 at 1:05 PM EST Left VM  ------  Notes recorded by Laurey Morale, MD on 12/17/2016 at 9:35 AM EST Labs ok except glucose high. Needs to see PCP, needs better control.

## 2017-01-07 ENCOUNTER — Other Ambulatory Visit: Payer: Self-pay | Admitting: Internal Medicine

## 2017-02-18 IMAGING — MR MR HEAD W/O CM
9 of 10 series · 36 of 48 positions shown · non-contrast
Comparison: None.

CLINICAL DATA: Left hand numbness and weakness.

EXAM:
MRI HEAD WITHOUT CONTRAST
TECHNIQUE: Multiplanar, multiecho pulse sequences of the brain and surrounding
structures were obtained without intravenous contrast.

[Series 3: DWI · axial · 3.0mm · 0.94mm/px · z∈[-76,+60]mm · 9 of 94 slices shown (1 of 2)]
[im 1/94]
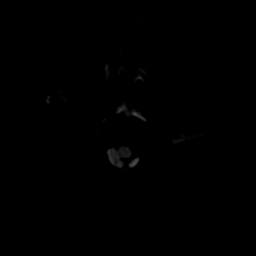
[im 12/94]
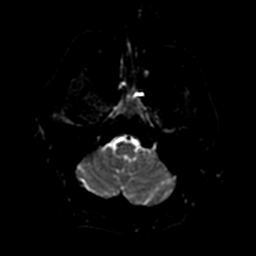
[im 24/94]
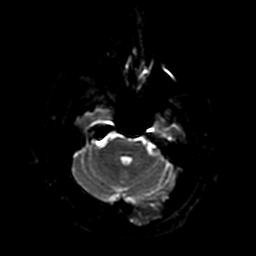
[im 35/94]
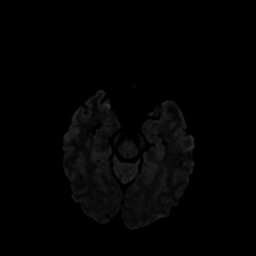
[im 47/94]
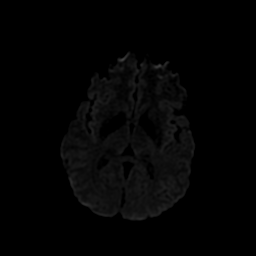
[im 59/94]
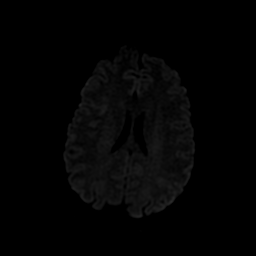
[im 70/94]
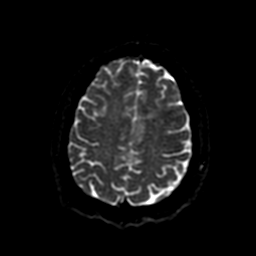
[im 82/94]
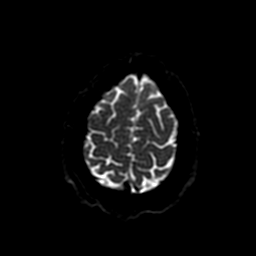
[im 94/94]
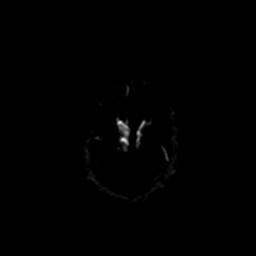

[Series 4: FLAIR · sagittal · 5.0mm · 0.47mm/px · 2 of 23 slices shown (1 of 2)]
[im 1/23]
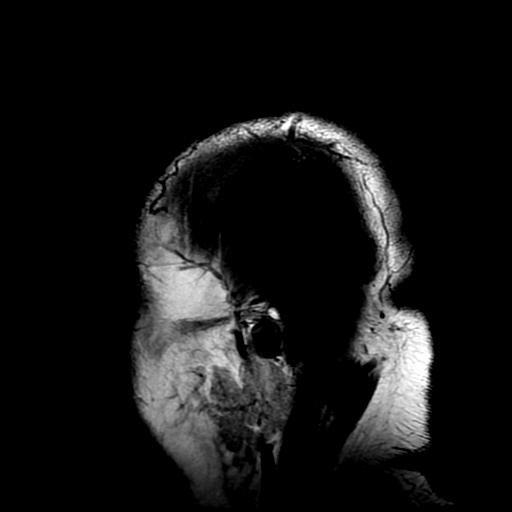
[im 23/23]
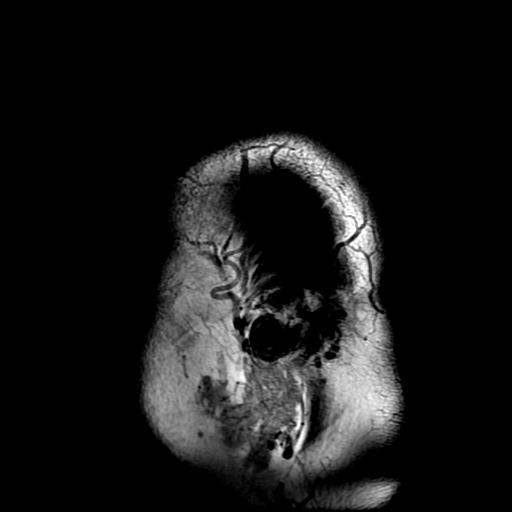

[Series 6: T2 · axial · 5.0mm · 0.47mm/px · z∈[-76,+59]mm · 2 of 24 slices shown (1 of 2)]
[im 1/24]
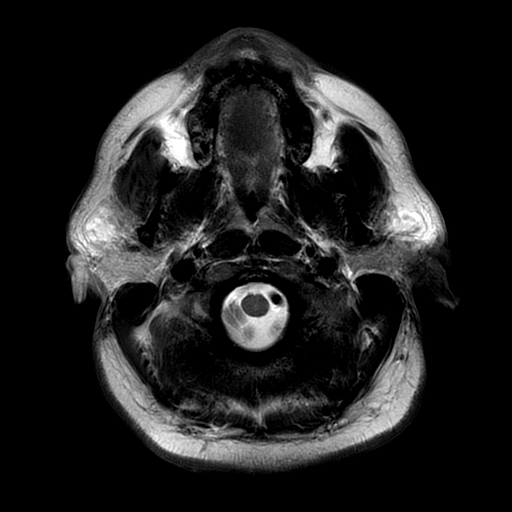
[im 24/24]
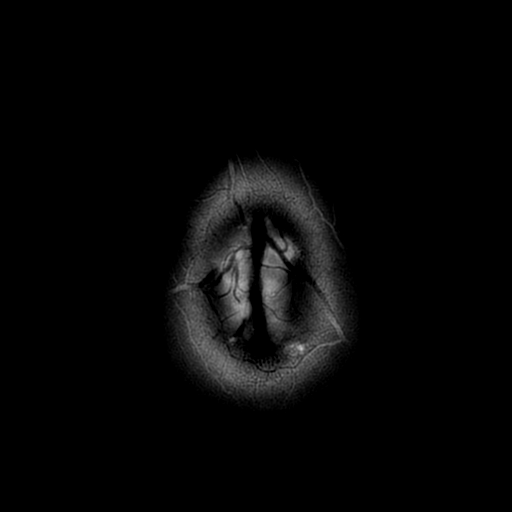

[Series 7: FLAIR · axial · 5.0mm · 0.47mm/px · z∈[-76,+59]mm · 2 of 24 slices shown (2 of 2)]
[im 1/24]
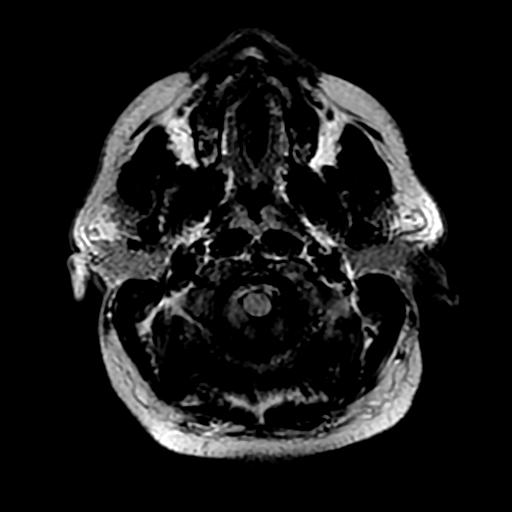
[im 24/24]
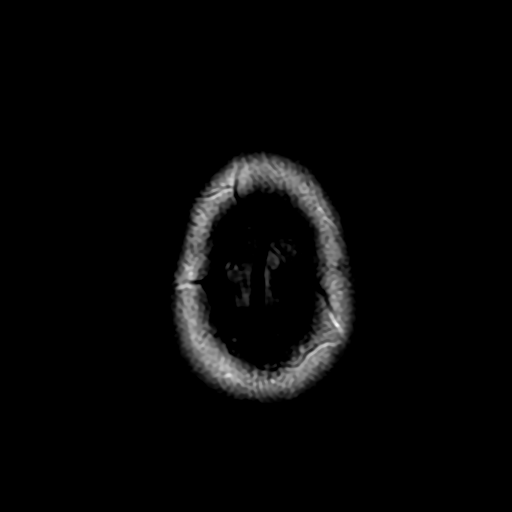

[Series 8: DWI · coronal · 4.0mm · 0.94mm/px · 7 of 67 slices shown (2 of 2)]
[im 1/67]
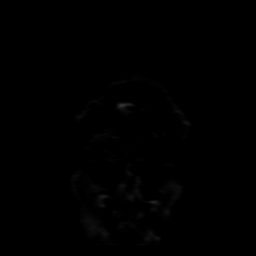
[im 12/67]
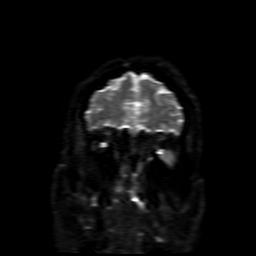
[im 23/67]
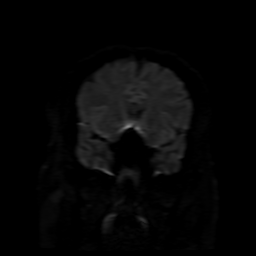
[im 34/67]
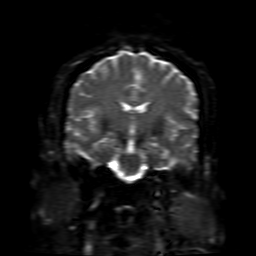
[im 45/67]
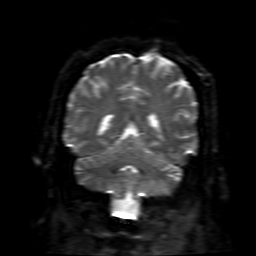
[im 56/67]
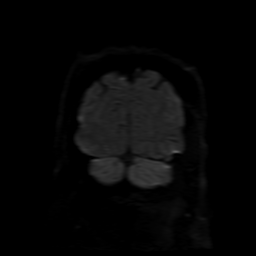
[im 67/67]
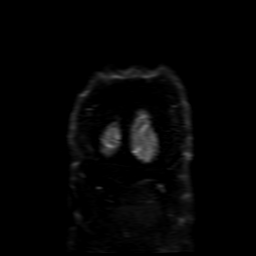

[Series 9: (person_name) · axial · 3.0mm · 0.47mm/px · z∈[-78,-32]mm · 3 of 96 slices shown]
[im 1/96]
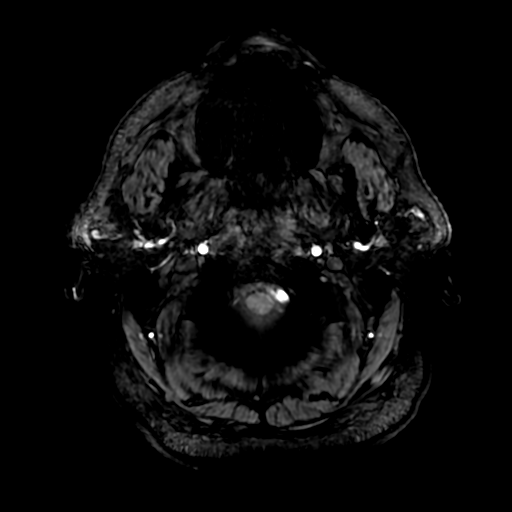
[im 11/96]
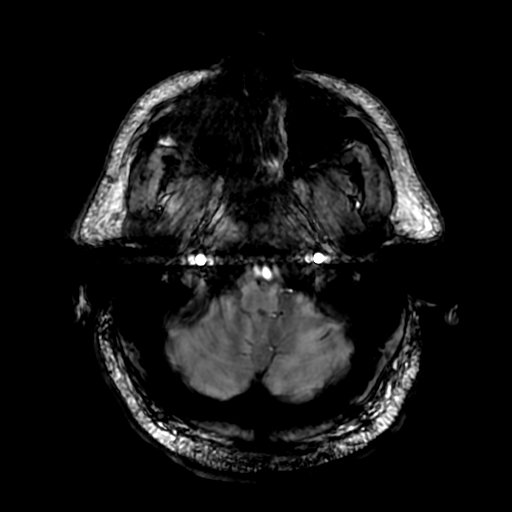
[im 32/96]
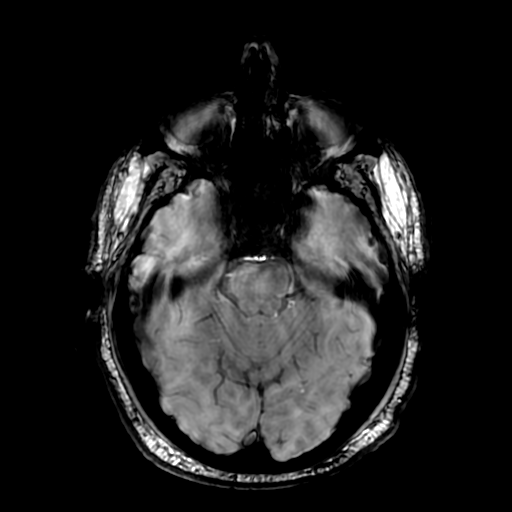

[Series 11: T2 · coronal · 5.0mm · 0.39mm/px · 3 of 28 slices shown (2 of 2)]
[im 1/28]
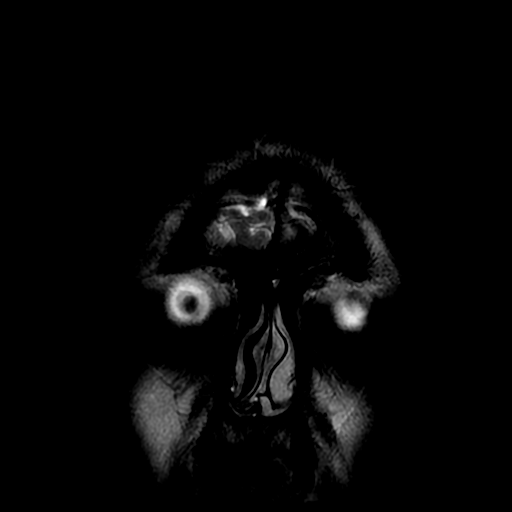
[im 14/28]
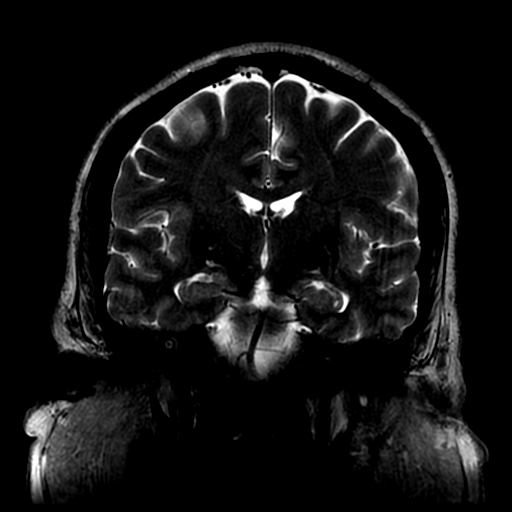
[im 28/28]
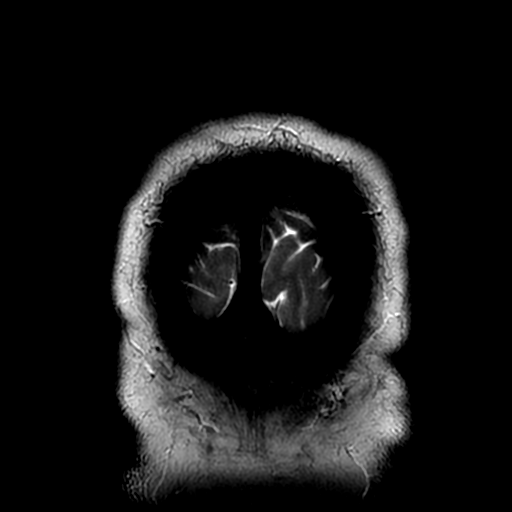

[Series 350: ADC · axial · 3.0mm · 0.94mm/px · z∈[-76,+60]mm · 5 of 47 slices shown (1 of 2)]
[im 1/47]
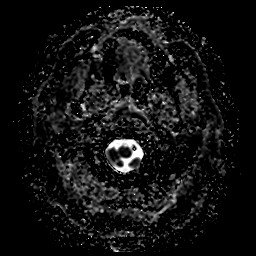
[im 12/47]
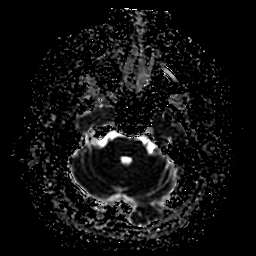
[im 24/47]
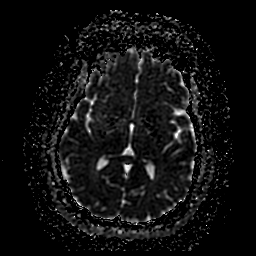
[im 35/47]
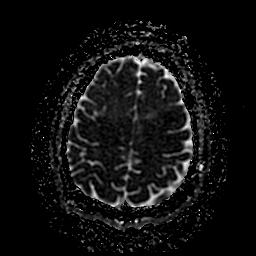
[im 47/47]
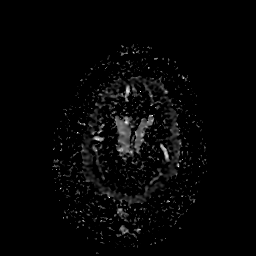

[Series 850: ADC · coronal · 4.0mm · 0.94mm/px · 3 of 34 slices shown (2 of 2)]
[im 1/34]
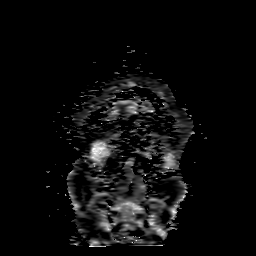
[im 17/34]
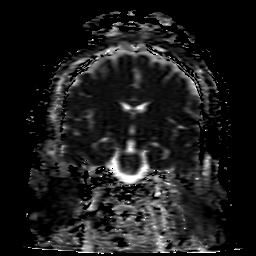
[im 34/34]
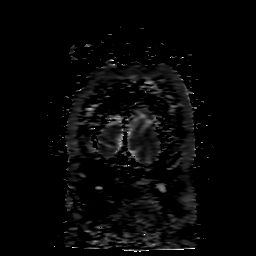

[36 of 48 positions shown; findings below may reference images not displayed]

FINDINGS: Brain Parenchyma: No acute infarct or intraparenchymal hemorrhage.
No focal parenchymal signal abnormality. No mass lesion or midline
shift. The major intracranial flow voids are preserved. The midline
structures are normal.

Ventricles, Sulci and Extra-axial Spaces: Normal for age. No
extra-axial collection.

Paranasal Sinuses and Mastoids: No fluid levels or advanced mucosal
thickening.

Orbits: Normal.

Bones and Soft Tissues: The visualized skull base, calvarium and
extracranial soft tissues are normal.
IMPRESSION: Normal brain MRI for age. No findings to explain reported left hand
weakness.

## 2017-03-28 ENCOUNTER — Emergency Department: Payer: 59

## 2017-03-28 ENCOUNTER — Observation Stay
Admission: EM | Admit: 2017-03-28 | Discharge: 2017-03-29 | Disposition: A | Discharge status: Home / Self Care | Payer: 59 | Attending: Internal Medicine | Admitting: Internal Medicine

## 2017-03-28 DIAGNOSIS — E860 Dehydration: Secondary | ICD-10-CM | POA: Insufficient documentation

## 2017-03-28 DIAGNOSIS — Z7984 Long term (current) use of oral hypoglycemic drugs: Secondary | ICD-10-CM | POA: Insufficient documentation

## 2017-03-28 DIAGNOSIS — I509 Heart failure, unspecified: Secondary | ICD-10-CM | POA: Insufficient documentation

## 2017-03-28 DIAGNOSIS — Z950 Presence of cardiac pacemaker: Secondary | ICD-10-CM | POA: Insufficient documentation

## 2017-03-28 DIAGNOSIS — I110 Hypertensive heart disease with heart failure: Secondary | ICD-10-CM | POA: Insufficient documentation

## 2017-03-28 DIAGNOSIS — I10 Essential (primary) hypertension: Secondary | ICD-10-CM | POA: Diagnosis present

## 2017-03-28 DIAGNOSIS — D649 Anemia, unspecified: Secondary | ICD-10-CM

## 2017-03-28 DIAGNOSIS — R55 Syncope and collapse: Principal | ICD-10-CM | POA: Insufficient documentation

## 2017-03-28 DIAGNOSIS — E1165 Type 2 diabetes mellitus with hyperglycemia: Secondary | ICD-10-CM | POA: Insufficient documentation

## 2017-03-28 DIAGNOSIS — R739 Hyperglycemia, unspecified: Secondary | ICD-10-CM

## 2017-03-28 DIAGNOSIS — N289 Disorder of kidney and ureter, unspecified: Secondary | ICD-10-CM | POA: Insufficient documentation

## 2017-03-28 DIAGNOSIS — Z9119 Patient's noncompliance with other medical treatment and regimen: Secondary | ICD-10-CM | POA: Insufficient documentation

## 2017-03-28 HISTORY — DX: Type 2 diabetes mellitus without complications: E11.9

## 2017-03-28 HISTORY — DX: Heart failure, unspecified: I50.9

## 2017-03-28 HISTORY — DX: Essential (primary) hypertension: I10

## 2017-03-28 LAB — CBC AND DIFFERENTIAL
Absolute NRBC: 0 10*3/uL
Basophils Absolute Automated: 0.05 10*3/uL (ref 0.00–0.20)
Basophils Automated: 0.7 %
Eosinophils Absolute Automated: 0.05 10*3/uL (ref 0.00–0.70)
Eosinophils Automated: 0.7 %
Hematocrit: 42.9 % (ref 42.0–52.0)
Hgb: 14.1 g/dL (ref 13.0–17.0)
Immature Granulocytes Absolute: 0.03 10*3/uL
Immature Granulocytes: 0.4 %
Lymphocytes Absolute Automated: 2.13 10*3/uL (ref 0.50–4.40)
Lymphocytes Automated: 28 %
MCH: 28.6 pg (ref 28.0–32.0)
MCHC: 32.9 g/dL (ref 32.0–36.0)
MCV: 87 fL (ref 80.0–100.0)
MPV: 11.2 fL (ref 9.4–12.3)
Monocytes Absolute Automated: 0.54 10*3/uL (ref 0.00–1.20)
Monocytes: 7.1 %
Neutrophils Absolute: 4.81 10*3/uL (ref 1.80–8.10)
Neutrophils: 63.1 %
Nucleated RBC: 0 /100 WBC (ref 0.0–1.0)
Platelets: 233 10*3/uL (ref 140–400)
RBC: 4.93 10*6/uL (ref 4.70–6.00)
RDW: 12 % (ref 12–15)
WBC: 7.61 10*3/uL (ref 3.50–10.80)

## 2017-03-28 LAB — COMPREHENSIVE METABOLIC PANEL
ALT: 16 U/L (ref 0–55)
AST (SGOT): 10 U/L (ref 5–34)
Albumin/Globulin Ratio: 1.4 (ref 0.9–2.2)
Albumin: 3.6 g/dL (ref 3.5–5.0)
Alkaline Phosphatase: 77 U/L (ref 38–106)
Anion Gap: 10 (ref 5.0–15.0)
BUN: 19 mg/dL (ref 9–28)
Bilirubin, Total: 0.5 mg/dL (ref 0.2–1.2)
CO2: 25 mEq/L (ref 22–29)
Calcium: 8.9 mg/dL (ref 8.5–10.5)
Chloride: 101 mEq/L (ref 100–111)
Creatinine: 1.5 mg/dL — ABNORMAL HIGH (ref 0.7–1.3)
Globulin: 2.5 g/dL (ref 2.0–3.6)
Glucose: 481 mg/dL — ABNORMAL HIGH (ref 70–100)
Potassium: 4.1 mEq/L (ref 3.5–5.1)
Protein, Total: 6.1 g/dL (ref 6.0–8.3)
Sodium: 136 mEq/L (ref 136–145)

## 2017-03-28 LAB — PT AND APTT
PT INR: 0.9 (ref 0.9–1.1)
PT: 12.2 s — ABNORMAL LOW (ref 12.6–15.0)
PTT: 25 s (ref 23–37)

## 2017-03-28 LAB — TROPONIN I: Troponin I: 0.02 ng/mL (ref 0.00–0.09)

## 2017-03-28 LAB — GFR: EGFR: 49.8

## 2017-03-28 LAB — GLUCOSE WHOLE BLOOD - POCT
Whole Blood Glucose POCT: 441 mg/dL — ABNORMAL HIGH (ref 70–100)
Whole Blood Glucose POCT: 380 mg/dL — ABNORMAL HIGH (ref 70–100)
Whole Blood Glucose POCT: 297 mg/dL — ABNORMAL HIGH (ref 70–100)

## 2017-03-28 MED ORDER — INSULIN LISPRO 100 UNIT/ML SC SOLN
1.00 [IU] | Freq: Three times a day (TID) | SUBCUTANEOUS | Status: DC
Start: 2017-03-29 — End: 2017-03-29
  Administered 2017-03-29 (×2): 5 [IU] via SUBCUTANEOUS
  Filled 2017-03-28 (×2): qty 15

## 2017-03-28 MED ORDER — INSULIN LISPRO 100 UNIT/ML SC SOLN
1.00 [IU] | Freq: Every evening | SUBCUTANEOUS | Status: DC
Start: 2017-03-28 — End: 2017-03-29
  Administered 2017-03-28: 22:00:00 3 [IU] via SUBCUTANEOUS
  Filled 2017-03-28: qty 9

## 2017-03-28 MED ORDER — INSULIN REGULAR HUMAN 100 UNIT/ML IJ SOLN
10.00 [IU] | Freq: Once | INTRAMUSCULAR | Status: AC
Start: 2017-03-28 — End: 2017-03-28
  Administered 2017-03-28: 19:00:00 10 [IU] via SUBCUTANEOUS

## 2017-03-28 MED ORDER — DEXTROSE 50 % IV SOLN
12.50 g | INTRAVENOUS | Status: DC | PRN
Start: 2017-03-28 — End: 2017-03-29

## 2017-03-28 MED ORDER — GLUCAGON 1 MG IJ SOLR (WRAP)
1.00 mg | INTRAMUSCULAR | Status: DC | PRN
Start: 2017-03-28 — End: 2017-03-29

## 2017-03-28 MED ORDER — GLUCOSE 40 % PO GEL
15.00 g | ORAL | Status: DC | PRN
Start: 2017-03-28 — End: 2017-03-29

## 2017-03-28 MED ORDER — SODIUM CHLORIDE 0.9 % IV BOLUS
1000.00 mL | Freq: Once | INTRAVENOUS | Status: AC
Start: 2017-03-28 — End: 2017-03-28
  Administered 2017-03-28: 17:00:00 1000 mL via INTRAVENOUS

## 2017-03-28 NOTE — Progress Notes (Signed)
Patient arrived to room 632-1 from ED via stretcher. Vitals and blood glucose taken upon patient's arrival. Patient states he came to the hospital due to palpitations after falling down. Patient is alert and oriented, and ambulates with no gait disturbances. Patient oriented to room/call bell and safety precautions. Patient's blood glucose is elevated, and there are no medication orders in; Dr.Amoah notified. Currently awaiting orders.

## 2017-03-28 NOTE — H&P (Signed)
ADMISSION HISTORY AND PHYSICAL EXAM    Date Time: 03/29/17 1:44 AM  Patient Name: Samuel Koch  Attending Physician: Mosie Epstein, MD  Primary Care Physician: Patsy Lager, MD      CC:   Koch/p Fall   Generalized weakness   High Sugar Level   Palpitations    History of Presenting Illness:   GREGORY BARRICK is a 47 y.o. male who presents to the hospital with after episode of a mechanical fall. Patient claimed to be unduly weak. Patient stated that prior to coming to ER had near syncope symptoms with associated palpitations. On arrival to the patient found to be lethargic, biochemical work up revealed sugar level of 490, patient non-compliant of mediation regimen for DM control. He denies chest pain or dyspnea.  He has a history of CHF, has a pacemaker, but no defibrillator.  He also reports waxing and waning LUQ pain for the past 2 weeks.  Pain is non-radiating, 4/10 in intensity. Patient given insulin and hydration for hyperglycemic dehydration due to medication non-compliant.         Past Medical History:     Past Medical History:   Diagnosis Date   . Congestive heart failure    . Diabetes mellitus    . Hypertension        Available old records reviewed .    Past Surgical History:     Past Surgical History:   Procedure Laterality Date   . APPENDECTOMY     . CARDIAC PACEMAKER PLACEMENT         Family History:     Family History   Problem Relation Age of Onset   . Heart disease Father    . Hypertension Father    . Heart disease Sister    . Heart attack Sister    . Heart disease Brother    . Diabetes Brother    . Diabetes Paternal Grandmother        Social History:     Social History     Social History   . Marital status: Married     Spouse name: N/A   . Number of children: N/A   . Years of education: N/A     Social History Main Topics   . Smoking status: Never Smoker   . Smokeless tobacco: Never Used   . Alcohol use No   . Drug use: No   . Sexual activity: Not on file     Other Topics Concern   . Not on file      Social History Narrative   . No narrative on file       Allergies:   No Known Allergies    Medications:     Prescriptions Prior to Admission   Medication Sig   . metFORMIN (GLUCOPHAGE) 1000 MG tablet Take 1,000 mg by mouth 2 (two) times daily with meals.           Review of Systems:   General ROS: negative for - chills,  fever  Psychological ROS: negative for - anxiety , depression  Ophthalmic ROS: negative for - blurry vision or eye pain  ENT ROS: negative for - nasal congestion or oral lesions  Allergy and Immunology ROS: negative for - hives  Hematological and Lymphatic ROS: negative for - bleeding problems  Endocrine ROS: negative for - temperature intolerance  Respiratory ROS: no cough, shortness of breath, or wheezing  Cardiovascular ROS: no chest pain or dyspnea on exertion  Gastrointestinal ROS:  no abdominal pain, change in bowel habits, or black or bloody stools  Genito-Urinary ROS: no dysuria, trouble voiding, or hematuria  Musculoskeletal ROS: negative for - joint pain or joint swelling  Neurological ROS: negative for - numbness/tingling or weakness  Dermatological ROS: negative for rash      Physical Exam:   Blood pressure (!) 143/92, pulse 62, temperature 97.5 F (36.4 C), temperature source Oral, resp. rate 20, height 1.88 m (6\' 2" ), weight 146.4 kg (322 lb 11.2 oz), SpO2 97 %.    Intake and Output Summary (Last 24 hours) at Date Time    Intake/Output Summary (Last 24 hours) at 03/29/17 0144  Last data filed at 03/28/17 1803   Gross per 24 hour   Intake             1000 ml   Output                0 ml   Net             1000 ml       General:  no acute distress.  HEENT: perrla, eomi, mucous membranes moist  Neck: supple  Cardiovascular: regular rate and rhythm, no murmurs.  Lungs: clear to auscultation bilaterally, without wheezing  Abdomen: soft, non-tender, non-distended; no palpable masses, normoactive bowel sounds  Extremities: no clubbing, cyanosis, or edema  Neuro: alert, awake, oriented ,  motor strength grossly intact.   Skin: warm and dry.  Heme: no bleeding.  Psych: moods stable.      Labs:   Labs reviewed .  Results     Procedure Component Value Units Date/Time    Glucose Whole Blood - POCT [[id number]]  (Abnormal) Collected:  03/28/17 2112     Updated:  03/28/17 2116     POCT - Glucose Whole blood 297 (H) mg/dL     Glucose Whole Blood - POCT [[id number]]  (Abnormal) Collected:  03/28/17 1851     Updated:  03/28/17 1855     POCT - Glucose Whole blood 380 (H) mg/dL     Troponin I [[id number]] Collected:  03/28/17 1702    Specimen:  Blood Updated:  03/28/17 1736     Troponin I 0.02 ng/mL     Comprehensive metabolic panel [[id number]]  (Abnormal) Collected:  03/28/17 1702    Specimen:  Blood Updated:  03/28/17 1730     Glucose 481 (H) mg/dL      BUN 19 mg/dL      Creatinine 1.5 (H) mg/dL      Sodium 536 mEq/L      Potassium 4.1 mEq/L      Chloride 101 mEq/L      CO2 25 mEq/L      Calcium 8.9 mg/dL      Protein, Total 6.1 g/dL      Albumin 3.6 g/dL      AST (SGOT) 10 U/L      ALT 16 U/L      Alkaline Phosphatase 77 U/L      Bilirubin, Total 0.5 mg/dL      Globulin 2.5 g/dL      Albumin/Globulin Ratio 1.4     Anion Gap 10.0    GFR [[id number]] Collected:  03/28/17 1702     Updated:  03/28/17 1730     EGFR 49.8    PT/APTT [[id number]]  (Abnormal) Collected:  03/28/17 1702     Updated:  03/28/17 1718     PT 12.2 (L) sec  PT INR 0.9     PT Anticoag. Given Within 48 hrs. None     PTT 25 sec     CBC with differential [[id number]] Collected:  03/28/17 1702    Specimen:  Blood from Blood Updated:  03/28/17 1710     WBC 7.61 x10 3/uL      Hgb 14.1 g/dL      Hematocrit 28.4 %      Platelets 233 x10 3/uL      RBC 4.93 x10 6/uL      MCV 87.0 fL      MCH 28.6 pg      MCHC 32.9 g/dL      RDW 12 %      MPV 11.2 fL      Neutrophils 63.1 %      Lymphocytes Automated 28.0 %      Monocytes 7.1 %      Eosinophils Automated 0.7 %      Basophils Automated 0.7 %      Immature Granulocyte 0.4 %      Nucleated RBC 0.0 /100 WBC       Neutrophils Absolute 4.81 x10 3/uL      Abs Lymph Automated 2.13 x10 3/uL      Abs Mono Automated 0.54 x10 3/uL      Abs Eos Automated 0.05 x10 3/uL      Absolute Baso Automated 0.05 x10 3/uL      Absolute Immature Granulocyte 0.03 x10 3/uL      Absolute NRBC 0.00 x10 3/uL         Imaging personally reviewed .  Radiology Results (24 Hour)     Procedure Component Value Units Date/Time    CT Abd/Pelvis without Contrast [[id number]] Collected:  03/28/17 1757    Order Status:  Completed Updated:  03/28/17 1805    Narrative:       CT ABDOMEN AND PELVIS WITHOUT CONTRAST    CLINICAL STATEMENT: Left lower quadrant pain.     COMPARISON: No prior studies are available for comparison.     TECHNIQUE: Helical axial imaging from above the domes of diaphragm  through the pubic symphysis was performed without the administration of  intravenous contrast and without  intraluminal bowel contrast. Sagittal  and coronal reconstructions were obtained.     Note that CT scanning at this site utilizes multiple dose reduction  techniques including automatic exposure control, adjustment of the MAS  and/or KVP according to patient size, and use of iterative  reconstruction technique.    FINDINGS: The lung bases are clear. Pacemaker leads are present within  the right heart.    ABDOMEN AND PELVIS:    The study is performed without intravenous contrast therefore evaluation  of the solid parenchymal organs and vascular structures is limited.     Hepatobiliary: The unenhanced liver grossly unremarkable appearance.     Gallbladder: Unremarkable.    Spleen: Unremarkable.     Pancreas: Limited evaluation of the unenhanced pancreas is unremarkable.    Adrenal glands: Unremarkable.     Kidneys: No acute abnormality can be visualized of the unenhanced  kidneys. There is no hydronephrosis. No significant perinephric  stranding.     Lymph Nodes: No abdominal or pelvic lymphadenopathy.    Hollow viscus: The large and small bowel are normal in  caliber.  Scattered diverticula are identified throughout the descending and  sigmoid colon.     Peritoneal cavity: No ascites is present. There is no free air.  There is a small fat-containing left inguinal hernia.    Vasculature: No significant abnormalities.    Miscellaneous pelvis: No significant findings.    Musculoskeletal: Severe endplate degenerative changes are noted of the  L2-3, L3-4, L4-5 and L5-S1 levels. Sclerotic degenerative changes are  noted of the facet joints of the mid and lower lumbar spine. There is a  mild lumbar sclerotic curvature with convexity to the left.       Impression:         No evidence of acute intra-abdominal pathology on this limited study  performed without oral or intravenous contrast.     Fonnie Mu, MD   03/28/2017 6:01 PM    Chest AP Portable [[id number]] Collected:  03/28/17 1719    Order Status:  Completed Updated:  03/28/17 1723    Narrative:       INDICATION: Chest pain    COMPARISON: No prior    FINDINGS:  A single radiograph of the chest performed. Portable film.  Dual pacemaker appears intact.  The heart is normal in size.   The mediastinal and hilar structures are within normal limits.  The lung fields are clear. No pleural effusion.  No pneumothorax.    The  visualized osseous structures demonstrates no acute abnormality.      Impression:        No active disease    Kinnie Feil, MD   03/28/2017 5:19 PM            Assessment:     Patient Active Problem List   Diagnosis   . Near syncope   . Type 2 diabetes mellitus with hyperglycemia   . Essential hypertension   . Combined systolic and diastolic congestive heart failure   . Dehydration, moderate           Plan:   Admit to telemetry   Fall precaution   Gentle hydration  CT abdomen/Pelvix with contrast for further evaluation of abd pain    Glycemic control with insulin regimen   Blood pressure control   Monitor electrolytes   DVT and GI prophylaxis  Fasting lipid panel   A1c Level           Signed by: Mosie Epstein, MD   cc:Pcp, Kathreen Cosier, MD

## 2017-03-28 NOTE — ED Notes (Signed)
Transfer to IMCU delayed due to staff being unable to take report

## 2017-03-28 NOTE — ED Notes (Signed)
Bed: E12  Expected date:   Expected time:   Means of arrival:   Comments:  Held 424  Diabetic

## 2017-03-28 NOTE — ED Provider Notes (Signed)
Physician/Midlevel provider first contact with patient: 03/28/17 1636         History     Chief Complaint   Patient presents with   . Fall     mechanical fall (trip)   . Generalized weakness     Samuel Koch presents after an episode of palpitations associated with near syncope this afternoon.  He was noted to be hyperglycemic at 490, and he admits that he has not taken his metformin in over 2 months since he moved from [facility].  He denies chest pain or dyspnea.  He has a history of CHF, has a pacemaker, but no defibrillator.  He also reports waxing and waning LUQ pain for the past 2 weeks.  Pain is non-radiating, 4/10 in intensity.               Nursing (triage) note reviewed for the following pertinent information:  pt BIBA for evaluation after a mechanical fall. Pt states he tripped and fell in a parking lot, landed on his knees and hands. Denies hitting his head. Pt states he hasn't been feeling right since. Dexi by medics was 490.     Past Medical History:   Diagnosis Date   . Congestive heart failure    . Diabetes mellitus    . Hypertension        Past Surgical History:   Procedure Laterality Date   . APPENDECTOMY     . CARDIAC PACEMAKER PLACEMENT         Family History   Problem Relation Age of Onset   . Heart disease Father    . Hypertension Father    . Heart disease Sister    . Heart attack Sister    . Heart disease Brother    . Diabetes Brother    . Diabetes Paternal Grandmother        Social  Social History   Substance Use Topics   . Smoking status: Not on file   . Smokeless tobacco: Never Used   . Alcohol use No       .     No Known Allergies    Home Medications     Med List Status:  In Progress Set By: Jethro Poling, RN at 03/28/2017  4:58 PM                metFORMIN (GLUCOPHAGE) 1000 MG tablet     Take 1,000 mg by mouth 2 (two) times daily with meals.           Review of Systems   Constitutional: Negative for chills, fever and unexpected weight change.   HENT: Negative for trouble swallowing.     Respiratory: Negative for shortness of breath.    Cardiovascular: Negative for chest pain and leg swelling.   Gastrointestinal: Positive for abdominal pain.   Endocrine: Positive for polydipsia and polyuria.   Genitourinary: Negative for dysuria.   Musculoskeletal: Negative for arthralgias, back pain and neck pain.   Neurological: Positive for dizziness and syncope ( near). Negative for numbness and headaches.   All other systems reviewed and are negative.      Physical Exam    BP: 138/76, Heart Rate: 70, Temp: 99.1 F (37.3 C), Resp Rate: 18, SpO2: 96 %, Weight: 151.9 kg    Physical Exam   Constitutional: He is oriented to person, place, and time. He appears well-developed and well-nourished.   HENT:   Head: Normocephalic and atraumatic.   Cardiovascular: Normal rate and regular  rhythm.    No murmur heard.  Pulmonary/Chest: Effort normal and breath sounds normal. No respiratory distress. He has no wheezes. He has no rales.   Abdominal: Soft. Bowel sounds are normal. He exhibits no distension. There is tenderness in the left upper quadrant.   Musculoskeletal: He exhibits edema ( 1+ BLE).   Neurological: He is alert and oriented to person, place, and time. No cranial nerve deficit.   Skin: Skin is warm and dry.   Psychiatric: He has a normal mood and affect.   Nursing note and vitals reviewed.        MDM and ED Course     ED Medication Orders     Start Ordered     Status Ordering Provider    03/28/17 1806 03/28/17 1805  insulin regular (HumuLIN R,NovoLIN R) injection 10 Units  Once     Route: Subcutaneous  Ordered Dose: 10 Units     Ordered Nayellie Sanseverino O    03/28/17 1655 03/28/17 1654  sodium chloride 0.9 % bolus 1,000 mL  Once     Route: Intravenous  Ordered Dose: 1,000 mL     Last MAR action:  New Bag Roshini Fulwider O         EKG: NSR @ 63 bpm, no ST/T changes, normal ekg.     MDM  Number of Diagnoses or Management Options  Anemia, unspecified type:   Near syncope:   Renal insufficiency:   Diagnosis  management comments: Syncope and abdominal pain workup. Admit to tele given history of CHF           Results     Procedure Component Value Units Date/Time    Troponin I [[id number]] Collected:  03/28/17 1702    Specimen:  Blood Updated:  03/28/17 1736     Troponin I 0.02 ng/mL     Comprehensive metabolic panel [[id number]]  (Abnormal) Collected:  03/28/17 1702    Specimen:  Blood Updated:  03/28/17 1730     Glucose 481 (H) mg/dL      BUN 19 mg/dL      Creatinine 1.5 (H) mg/dL      Sodium 782 mEq/L      Potassium 4.1 mEq/L      Chloride 101 mEq/L      CO2 25 mEq/L      Calcium 8.9 mg/dL      Protein, Total 6.1 g/dL      Albumin 3.6 g/dL      AST (SGOT) 10 U/L      ALT 16 U/L      Alkaline Phosphatase 77 U/L      Bilirubin, Total 0.5 mg/dL      Globulin 2.5 g/dL      Albumin/Globulin Ratio 1.4     Anion Gap 10.0    GFR [[id number]] Collected:  03/28/17 1702     Updated:  03/28/17 1730     EGFR 49.8    PT/APTT [[id number]]  (Abnormal) Collected:  03/28/17 1702     Updated:  03/28/17 1718     PT 12.2 (L) sec      PT INR 0.9     PT Anticoag. Given Within 48 hrs. None     PTT 25 sec     CBC with differential [[id number]] Collected:  03/28/17 1702    Specimen:  Blood from Blood Updated:  03/28/17 1710     WBC 7.61 x10 3/uL      Hgb 14.1 g/dL      Hematocrit  42.9 %      Platelets 233 x10 3/uL      RBC 4.93 x10 6/uL      MCV 87.0 fL      MCH 28.6 pg      MCHC 32.9 g/dL      RDW 12 %      MPV 11.2 fL      Neutrophils 63.1 %      Lymphocytes Automated 28.0 %      Monocytes 7.1 %      Eosinophils Automated 0.7 %      Basophils Automated 0.7 %      Immature Granulocyte 0.4 %      Nucleated RBC 0.0 /100 WBC      Neutrophils Absolute 4.81 x10 3/uL      Abs Lymph Automated 2.13 x10 3/uL      Abs Mono Automated 0.54 x10 3/uL      Abs Eos Automated 0.05 x10 3/uL      Absolute Baso Automated 0.05 x10 3/uL      Absolute Immature Granulocyte 0.03 x10 3/uL      Absolute NRBC 0.00 x10 3/uL           Radiology Results (24 Hour)     Procedure  Component Value Units Date/Time    CT Abd/Pelvis without Contrast [[id number]] Collected:  03/28/17 1757    Order Status:  Completed Updated:  03/28/17 1805    Narrative:       CT ABDOMEN AND PELVIS WITHOUT CONTRAST    CLINICAL STATEMENT: Left lower quadrant pain.     COMPARISON: No prior studies are available for comparison.     TECHNIQUE: Helical axial imaging from above the domes of diaphragm  through the pubic symphysis was performed without the administration of  intravenous contrast and without  intraluminal bowel contrast. Sagittal  and coronal reconstructions were obtained.     Note that CT scanning at this site utilizes multiple dose reduction  techniques including automatic exposure control, adjustment of the MAS  and/or KVP according to patient size, and use of iterative  reconstruction technique.    FINDINGS: The lung bases are clear. Pacemaker leads are present within  the right heart.    ABDOMEN AND PELVIS:    The study is performed without intravenous contrast therefore evaluation  of the solid parenchymal organs and vascular structures is limited.     Hepatobiliary: The unenhanced liver grossly unremarkable appearance.     Gallbladder: Unremarkable.    Spleen: Unremarkable.     Pancreas: Limited evaluation of the unenhanced pancreas is unremarkable.    Adrenal glands: Unremarkable.     Kidneys: No acute abnormality can be visualized of the unenhanced  kidneys. There is no hydronephrosis. No significant perinephric  stranding.     Lymph Nodes: No abdominal or pelvic lymphadenopathy.    Hollow viscus: The large and small bowel are normal in caliber.  Scattered diverticula are identified throughout the descending and  sigmoid colon.     Peritoneal cavity: No ascites is present. There is no free air.    There is a small fat-containing left inguinal hernia.    Vasculature: No significant abnormalities.    Miscellaneous pelvis: No significant findings.    Musculoskeletal: Severe endplate degenerative  changes are noted of the  L2-3, L3-4, L4-5 and L5-S1 levels. Sclerotic degenerative changes are  noted of the facet joints of the mid and lower lumbar spine. There is a  mild lumbar sclerotic curvature with convexity to  the left.       Impression:         No evidence of acute intra-abdominal pathology on this limited study  performed without oral or intravenous contrast.     Fonnie Mu, MD   03/28/2017 6:01 PM    Chest AP Portable [[id number]] Collected:  03/28/17 1719    Order Status:  Completed Updated:  03/28/17 1723    Narrative:       INDICATION: Chest pain    COMPARISON: No prior    FINDINGS:  A single radiograph of the chest performed. Portable film.  Dual pacemaker appears intact.  The heart is normal in size.   The mediastinal and hilar structures are within normal limits.  The lung fields are clear. No pleural effusion.  No pneumothorax.    The  visualized osseous structures demonstrates no acute abnormality.      Impression:        No active disease    Kinnie Feil, MD   03/28/2017 5:19 PM                Procedures    Clinical Impression & Disposition     Clinical Impression  Final diagnoses:   Anemia, unspecified type   Near syncope   Renal insufficiency   Hyperglycemia        ED Disposition     ED Disposition Condition Date/Time Comment    Observation  Fri Mar 28, 2017  6:21 PM Admitting Physician: Mosie Epstein [[id number]]   Diagnosis: Near syncope [573220]   Estimated Length of Stay: < 2 midnights   Tentative Discharge Plan?: Home or Self Care [1]   Patient Class: Observation [104]             New Prescriptions    No medications on file                 Genia Hotter, MD  03/28/17 Rickey Primus

## 2017-03-29 DIAGNOSIS — I10 Essential (primary) hypertension: Secondary | ICD-10-CM | POA: Diagnosis present

## 2017-03-29 DIAGNOSIS — E860 Dehydration: Secondary | ICD-10-CM | POA: Diagnosis present

## 2017-03-29 DIAGNOSIS — D649 Anemia, unspecified: Secondary | ICD-10-CM | POA: Diagnosis present

## 2017-03-29 DIAGNOSIS — N289 Disorder of kidney and ureter, unspecified: Secondary | ICD-10-CM | POA: Diagnosis present

## 2017-03-29 DIAGNOSIS — R739 Hyperglycemia, unspecified: Secondary | ICD-10-CM | POA: Diagnosis present

## 2017-03-29 LAB — GLUCOSE WHOLE BLOOD - POCT
Whole Blood Glucose POCT: 288 mg/dL — ABNORMAL HIGH (ref 70–100)
Whole Blood Glucose POCT: 265 mg/dL — ABNORMAL HIGH (ref 70–100)

## 2017-03-29 MED ORDER — METFORMIN HCL ER (MOD) 1000 MG PO TB24
1000.00 mg | ORAL_TABLET | Freq: Every morning | ORAL | 0 refills | Status: DC
Start: 2017-03-29 — End: 2017-03-29

## 2017-03-29 MED ORDER — METFORMIN HCL 1000 MG PO TABS
1000.00 mg | ORAL_TABLET | Freq: Every morning | ORAL | 0 refills | Status: DC
Start: 2017-03-29 — End: 2017-10-08

## 2017-03-29 MED ORDER — INSULIN GLARGINE 100 UNIT/ML SC SOLN
25.00 [IU] | Freq: Every evening | SUBCUTANEOUS | 0 refills | Status: DC
Start: 2017-03-29 — End: 2017-10-08

## 2017-03-29 NOTE — Progress Notes (Signed)
The patient and/or caregiver's learning abilities assessed. This shift's individualized plan of care discussed, and includes blood glucose monitoring, chest pain monitoring. (accurate recording of fluid intake and output, fluid and diet restriction, daily weights, and when to call doctor. The patient or caregiver states the following personal goal related to the patient's deficit(s): "By discharge I want to be able to not have chest pains." The plan of care was discussed with the patient and/or caregiver, who agrees to it and demonstrates understanding of the disease process, treatment plan, medications and consequences of noncompliance. All questions and concerns were addressed.

## 2017-03-29 NOTE — Plan of Care (Signed)
Discharge  Information/instructions conveyed to patient, patient demonstrated understanding of information. Care plan and education resolved. Patient confirmed all personal belongings intact and correct. Patient left the unit on a steady gait, accompanied by clin tech and daughters.

## 2017-03-29 NOTE — UM Notes (Signed)
Observation Status Review    CC: Fall(mechanical fall(trip)) and Generalized Weakness    47 y.o male presents after an episode of palpitations associated with near syncope this afternoon.  He was noted to be hyperglycemic at 490, and he admits that he has not taken his metformin in over 2 months since he moved from [facility].  He denies chest pain or dyspnea.  He has a history of CHF, has a pacemaker, but no defibrillator.  He also reports waxing and waning LUQ pain for the past 2 weeks.  Pain is non-radiating, 4/10 in intensity.      VS: BP: 138/76, Heart Rate: 70, Temp: 99.1 F (37.3 C), Resp Rate: 18, SpO2: 96 %    Labs:  PT 12.2 (L)     Glucose 481 (H)     Creatinine 1.5 (H)     POCT - Glucose Whole blood 380 (H)     POCT - Glucose Whole blood 297 (H)     POCT - Glucose Whole blood 288 (H)     Imaging:  CT Abd/Pelvis without Contrast 03/28/17  IMPRESSION:   No evidence of acute intra-abdominal pathology on this limited study performed without oral or intravenous contrast.     Chest AP Portable 03/28/17  IMPRESSION:    No active disease    ED Medication Orders     Start Ordered     Status Ordering Provider    03/28/17 1806 03/28/17 1805  insulin regular (HumuLIN R,NovoLIN R) injection 10 Units  Once     Route: Subcutaneous  Ordered Dose: 10 Units     Ordered Genia Hotter    03/28/17 1655 03/28/17 1654  sodium chloride 0.9 % bolus 1,000 mL  Once     Route: Intravenous  Ordered Dose: 1,000 mL     Last MAR action:  New Bag OROGBEMI, BABATUNDE O     Clinical Impression  Final diagnoses:   Anemia, unspecified type   Near syncope   Renal insufficiency   Hyperglycemia               ED Disposition     ED Disposition Condition Date/Time Comment    Observation  Fri Mar 28, 2017  6:21 PM Admitting Physician: Mosie Epstein [[id number]]   Diagnosis: Near syncope [[id number]]   Estimated Length of Stay: < 2 midnights   Tentative Discharge Plan?: Home or Self Care [1]   Patient Class: Observation [104]

## 2017-03-29 NOTE — Plan of Care (Signed)
Problem: Safety  Goal: Patient will be free from injury during hospitalization  Outcome: Progressing   03/29/17 0018   Goal/Interventions addressed this shift   Patient will be free from injury during hospitalization  Assess patient's risk for falls and implement fall prevention plan of care per policy;Provide and maintain safe environment;Use appropriate transfer methods;Ensure appropriate safety devices are available at the bedside       Problem: Pain  Goal: Pain at adequate level as identified by patient  Outcome: Progressing   03/29/17 0018   Goal/Interventions addressed this shift   Pain at adequate level as identified by patient Identify patient comfort function goal;Assess for risk of opioid induced respiratory depression, including snoring/sleep apnea. Alert healthcare team of risk factors identified.;Assess pain on admission, during daily assessment and/or before any "as needed" intervention(s);Reassess pain within 30-60 minutes of any procedure/intervention, per Pain Assessment, Intervention, Reassessment (AIR) Cycle

## 2017-03-29 NOTE — Plan of Care (Signed)
Problem: Safety  Goal: Patient will be free from infection during hospitalization  Outcome: Progressing   03/29/17 1205   Goal/Interventions addressed this shift   Free from Infection during hospitalization Assess and monitor for signs and symptoms of infection       Problem: Pain  Goal: Pain at adequate level as identified by patient  Outcome: Progressing   03/29/17 1205   Goal/Interventions addressed this shift   Pain at adequate level as identified by patient Offer non-pharmacological pain management interventions       Problem: Fluid and Electrolyte Imbalance/ Endocrine  Goal: Fluid and electrolyte balance are achieved/maintained  Outcome: Progressing   03/29/17 1205   Goal/Interventions addressed this shift   Fluid and electrolyte balance are achieved/maintained Monitor/assess lab values and report abnormal values;Provide adequate hydration;Observe for cardiac arrhythmias       Problem: Diabetes: Glucose Imbalance  Goal: Blood glucose stable at established goal   03/29/17 1205   Goal/Interventions addressed this shift   Blood glucose stable at established goal  Assess for hypoglycemia /hyperglycemia;Monitor/assess vital signs;Ensure adequate hydration;Ensure appropriate consults are obtained (Nutrition, Diabetes Education, and Case Management)

## 2017-04-01 LAB — ECG 12-LEAD
Atrial Rate: 63 {beats}/min
P Axis: 38 degrees
P-R Interval: 180 ms
Q-T Interval: 426 ms
QRS Duration: 86 ms
QTC Calculation (Bezet): 435 ms
R Axis: 45 degrees
T Axis: 36 degrees
Ventricular Rate: 63 {beats}/min

## 2017-04-02 NOTE — Discharge Summary (Signed)
DISCHARGE SUMMARY      Date Time: 04/02/17 11:11 PM  Patient Name: Samuel Koch, Samuel Koch  Attending Physician: No att. providers found  Primary Care Physician: Patsy Lager, MD    Date of Admission:   03/28/2017    Date of Discharge:   03/29/2017    Discharge Dx:   Active Problems:    Near syncope    Hyperglycemia    Essential hypertension    Combined systolic and diastolic congestive heart failure    Dehydration, moderate    Anemia, unspecified type    Renal insufficiency  Resolved Problems:    * No resolved hospital problems. *      Consultations:   Treatment Team: Consulting Physician: Mosie Epstein, MD     Procedures/Radiology performed:      CBC    Recent Labs  Lab 03/28/17  1702   WBC 7.61   Hgb 14.1   Hematocrit 42.9   Platelets 233       CMP    Recent Labs  Lab 03/28/17  1702   Sodium 136   Potassium 4.1   Chloride 101   CO2 25   BUN 19   Creatinine 1.5*   Glucose 481*   Calcium 8.9   Protein, Total 6.1   Albumin 3.6   AST (SGOT) 10   ALT 16   Alkaline Phosphatase 77   Bilirubin, Total 0.5       Lipid panel              Cardiac enzymes    Recent Labs  Lab 03/28/17  1702   Troponin I 0.02       Ct Abd/pelvis Without Contrast    Result Date: 03/28/2017  CT ABDOMEN AND PELVIS WITHOUT CONTRAST CLINICAL STATEMENT: Left lower quadrant pain. COMPARISON: No prior studies are available for comparison. TECHNIQUE: Helical axial imaging from above the domes of diaphragm through the pubic symphysis was performed without the administration of intravenous contrast and without  intraluminal bowel contrast. Sagittal and coronal reconstructions were obtained. Note that CT scanning at this site utilizes multiple dose reduction techniques including automatic exposure control, adjustment of the MAS and/or KVP according to patient size, and use of iterative reconstruction technique. FINDINGS: The lung bases are clear. Pacemaker leads are present within the right heart. ABDOMEN AND PELVIS: The study is performed without intravenous contrast  therefore evaluation of the solid parenchymal organs and vascular structures is limited. Hepatobiliary: The unenhanced liver grossly unremarkable appearance. Gallbladder: Unremarkable. Spleen: Unremarkable. Pancreas: Limited evaluation of the unenhanced pancreas is unremarkable. Adrenal glands: Unremarkable. Kidneys: No acute abnormality can be visualized of the unenhanced kidneys. There is no hydronephrosis. No significant perinephric stranding. Lymph Nodes: No abdominal or pelvic lymphadenopathy. Hollow viscus: The large and small bowel are normal in caliber. Scattered diverticula are identified throughout the descending and sigmoid colon. Peritoneal cavity: No ascites is present. There is no free air. There is a small fat-containing left inguinal hernia. Vasculature: No significant abnormalities. Miscellaneous pelvis: No significant findings. Musculoskeletal: Severe endplate degenerative changes are noted of the L2-3, L3-4, L4-5 and L5-S1 levels. Sclerotic degenerative changes are noted of the facet joints of the mid and lower lumbar spine. There is a mild lumbar sclerotic curvature with convexity to the left.     No evidence of acute intra-abdominal pathology on this limited study performed without oral or intravenous contrast. Fonnie Mu, MD 03/28/2017 6:01 PM    Chest Ap Portable    Result Date: 03/28/2017  INDICATION: Chest pain COMPARISON: No prior FINDINGS:  A single radiograph of the chest performed. Portable film. Dual pacemaker appears intact. The heart is normal in size. The mediastinal and hilar structures are within normal limits. The lung fields are clear. No pleural effusion. No pneumothorax.  The  visualized osseous structures demonstrates no acute abnormality.      No active disease Kinnie Feil, MD 03/28/2017 5:19 PM       Hospital Course:   Samuel Koch is a 47 y.o. male who presents to the hospital with after episode of a mechanical fall. Patient claimed to be unduly weak. Patient stated  that prior to coming to ER had near syncope symptoms with associated palpitations. On arrival to the patient found to be lethargic, biochemical work up revealed sugar level of 490, patient non-compliant of mediation regimen for DM control.He denies chest pain or dyspnea. He has a history of CHF, has a pacemaker, but no defibrillator. He also reports waxing and waning LUQ pain for the past 2 weeks. Pain is non-radiating, 4/10 in intensity. Patient given insulin and hydration for hyperglycemic dehydration due to medication non-compliant. Tight glycemic control was initiated with sliding scale insulin. Patient condition improved with hydration and tight glycemic control and was subsequently discharged in stable conditio to follow up with PCP for DM monitoring and control.     Discharge Medications:        Medication List      START taking these medications    insulin glargine 100 UNIT/ML injection  Commonly known as:  LANTUS  Inject 25 Units into the skin nightly.        CHANGE how you take these medications    metFORMIN 1000 MG tablet  Commonly known as:  GLUCOPHAGE  Take 1 tablet (1,000 mg total) by mouth every morning with breakfast.  What changed:  when to take this           Where to Get Your Medications      You can get these medications from any pharmacy    Bring a paper prescription for each of these medications   insulin glargine 100 UNIT/ML injection   metFORMIN 1000 MG tablet           Discharge Instructions:       Disposition:  Home      Patient was instructed to follow up with Primary Care Doctor Pcp, [place], MD in 1 month     Minutes spent coordinating discharge and reviewing discharge plan:35 minutes      Signed by: Mosie Epstein, MD    CC: Patsy Lager, MD

## 2017-08-15 ENCOUNTER — Encounter: Payer: Self-pay | Admitting: Cardiology

## 2017-09-26 ENCOUNTER — Telehealth: Payer: Self-pay

## 2017-09-26 NOTE — Telephone Encounter (Signed)
Received call from [facility], Huntley Dec, stating that patient left a voicemail asking to be seen by the heart failure clinic he had heard about recently. He was seen in a [facility] in [place] but moved to [place] and needed to establish here. Per Huntley Dec the patient stated in the voicemail he was very symptomatic. She was advised to send patient to the ED as he would need a work up and possible IV diuresis. The patient was scheduled for a new patient appt on the 4th of Sept. She stated she would contact patient and let him know that he was to go to the ED for evaluation and that she had contacted the [facility] and had an appt set up for him as a new patient.

## 2017-10-06 ENCOUNTER — Observation Stay
Admission: EM | Admit: 2017-10-06 | Discharge: 2017-10-07 | Disposition: A | Discharge status: Home / Self Care | Payer: 59 | Attending: Emergency Medicine | Admitting: Emergency Medicine

## 2017-10-06 ENCOUNTER — Emergency Department: Payer: 59

## 2017-10-06 DIAGNOSIS — I429 Cardiomyopathy, unspecified: Secondary | ICD-10-CM | POA: Insufficient documentation

## 2017-10-06 DIAGNOSIS — E1165 Type 2 diabetes mellitus with hyperglycemia: Secondary | ICD-10-CM | POA: Insufficient documentation

## 2017-10-06 DIAGNOSIS — Z794 Long term (current) use of insulin: Secondary | ICD-10-CM | POA: Insufficient documentation

## 2017-10-06 DIAGNOSIS — Z9049 Acquired absence of other specified parts of digestive tract: Secondary | ICD-10-CM | POA: Insufficient documentation

## 2017-10-06 DIAGNOSIS — Z8249 Family history of ischemic heart disease and other diseases of the circulatory system: Secondary | ICD-10-CM | POA: Insufficient documentation

## 2017-10-06 DIAGNOSIS — Z833 Family history of diabetes mellitus: Secondary | ICD-10-CM | POA: Insufficient documentation

## 2017-10-06 DIAGNOSIS — I110 Hypertensive heart disease with heart failure: Principal | ICD-10-CM | POA: Insufficient documentation

## 2017-10-06 DIAGNOSIS — Z950 Presence of cardiac pacemaker: Secondary | ICD-10-CM | POA: Insufficient documentation

## 2017-10-06 DIAGNOSIS — R0602 Shortness of breath: Secondary | ICD-10-CM

## 2017-10-06 DIAGNOSIS — I5042 Chronic combined systolic (congestive) and diastolic (congestive) heart failure: Secondary | ICD-10-CM | POA: Insufficient documentation

## 2017-10-06 DIAGNOSIS — R0609 Other forms of dyspnea: Secondary | ICD-10-CM | POA: Diagnosis present

## 2017-10-06 DIAGNOSIS — I509 Heart failure, unspecified: Secondary | ICD-10-CM

## 2017-10-06 LAB — CBC AND DIFFERENTIAL
Basophils %: 1.2 % (ref 0.0–3.0)
Basophils Absolute: 0.1 10*3/uL (ref 0.0–0.3)
Eosinophils %: 1.6 % (ref 0.0–7.0)
Eosinophils Absolute: 0.1 10*3/uL (ref 0.0–0.8)
Hematocrit: 41.6 % (ref 39.0–52.5)
Hemoglobin: 14.4 gm/dL (ref 13.0–17.5)
Lymphocytes Absolute: 2.5 10*3/uL (ref 0.6–5.1)
Lymphocytes: 34 % (ref 15.0–46.0)
MCH: 31 pg (ref 28–35)
MCHC: 35 gm/dL (ref 32–36)
MCV: 90 fL (ref 80–100)
MPV: 8.9 fL (ref 6.0–10.0)
Monocytes Absolute: 0.5 10*3/uL (ref 0.1–1.7)
Monocytes: 7.1 % (ref 3.0–15.0)
Neutrophils %: 56.1 % (ref 42.0–78.0)
Neutrophils Absolute: 4.1 10*3/uL (ref 1.7–8.6)
PLT CT: 238 10*3/uL (ref 130–440)
RBC: 4.61 10*6/uL (ref 4.00–5.70)
RDW: 11 % (ref 11.0–14.0)
WBC: 7.3 10*3/uL (ref 4.0–11.0)

## 2017-10-06 LAB — COMPREHENSIVE METABOLIC PANEL
ALT: 19 U/L (ref 0–55)
AST (SGOT): 13 U/L (ref 10–42)
Albumin/Globulin Ratio: 1.06 Ratio (ref 0.80–2.00)
Albumin: 3.7 gm/dL (ref 3.5–5.0)
Alkaline Phosphatase: 89 U/L (ref 40–145)
Anion Gap: 19.6 mMol/L — ABNORMAL HIGH (ref 7.0–18.0)
BUN / Creatinine Ratio: 21.4 Ratio (ref 10.0–30.0)
BUN: 25 mg/dL — ABNORMAL HIGH (ref 7–22)
Bilirubin, Total: 0.2 mg/dL (ref 0.1–1.2)
CO2: 18.6 mMol/L — ABNORMAL LOW (ref 20.0–30.0)
Calcium: 9.5 mg/dL (ref 8.5–10.5)
Chloride: 102 mMol/L (ref 98–110)
Creatinine: 1.17 mg/dL (ref 0.80–1.30)
EGFR: 73 mL/min/{1.73_m2} (ref 60–150)
Globulin: 3.5 gm/dL (ref 2.0–4.0)
Glucose: 304 mg/dL — ABNORMAL HIGH (ref 71–99)
Osmolality Calc: 288 mOsm/kg (ref 275–300)
Potassium: 4.2 mMol/L (ref 3.5–5.3)
Protein, Total: 7.2 gm/dL (ref 6.0–8.3)
Sodium: 136 mMol/L (ref 136–147)

## 2017-10-06 LAB — VH DEXTROSE STICK GLUCOSE: Glucose POCT: 264 mg/dL — ABNORMAL HIGH (ref 71–99)

## 2017-10-06 LAB — D-DIMER, QUANTITATIVE: D-Dimer: 0.26 mg/L FEU (ref 0.19–0.52)

## 2017-10-06 LAB — HEMOGLOBIN A1C: Hgb A1C, %: 10.7 %

## 2017-10-06 LAB — TROPONIN I: Troponin I: 0.02 ng/mL (ref 0.00–0.02)

## 2017-10-06 LAB — B-TYPE NATRIURETIC PEPTIDE: B-Natriuretic Peptide: 43.5 pg/mL (ref 0.0–100.0)

## 2017-10-06 MED ORDER — ENOXAPARIN SODIUM 40 MG/0.4ML SC SOLN
40.00 mg | SUBCUTANEOUS | Status: DC
Start: 2017-10-06 — End: 2017-10-07
  Administered 2017-10-06: 23:00:00 40 mg via SUBCUTANEOUS
  Filled 2017-10-06: qty 0.4

## 2017-10-06 MED ORDER — INSULIN LISPRO 100 UNIT/ML SC SOPN
1.00 [IU] | PEN_INJECTOR | Freq: Every day | SUBCUTANEOUS | Status: DC | PRN
Start: 2017-10-06 — End: 2017-10-07
  Administered 2017-10-07: 03:00:00 4 [IU] via SUBCUTANEOUS

## 2017-10-06 MED ORDER — LANTUS SOLOSTAR 100 UNIT/ML SC SOPN
25.00 [IU] | PEN_INJECTOR | Freq: Every evening | SUBCUTANEOUS | Status: DC
Start: 2017-10-06 — End: 2017-10-07
  Administered 2017-10-06: 23:00:00 25 [IU] via SUBCUTANEOUS
  Filled 2017-10-06: qty 3

## 2017-10-06 MED ORDER — SODIUM CHLORIDE 0.9 % IJ SOLN
0.40 mg | INTRAMUSCULAR | Status: DC | PRN
Start: 2017-10-06 — End: 2017-10-07

## 2017-10-06 MED ORDER — ACETAMINOPHEN 650 MG RE SUPP
650.00 mg | RECTAL | Status: DC | PRN
Start: 2017-10-06 — End: 2017-10-07

## 2017-10-06 MED ORDER — ACETAMINOPHEN 325 MG PO TABS
650.00 mg | ORAL_TABLET | ORAL | Status: DC | PRN
Start: 2017-10-06 — End: 2017-10-07

## 2017-10-06 MED ORDER — HYDROCODONE-ACETAMINOPHEN 5-325 MG PO TABS
1.00 | ORAL_TABLET | ORAL | Status: DC | PRN
Start: 2017-10-06 — End: 2017-10-07

## 2017-10-06 MED ORDER — DEXTROSE 10 % IV BOLUS
125.00 mL | INTRAVENOUS | Status: DC | PRN
Start: 2017-10-06 — End: 2017-10-07

## 2017-10-06 MED ORDER — ACETAMINOPHEN 160 MG/5ML PO SOLN
650.00 mg | ORAL | Status: DC | PRN
Start: 2017-10-06 — End: 2017-10-07

## 2017-10-06 MED ORDER — ONDANSETRON HCL 4 MG/2ML IJ SOLN
4.00 mg | Freq: Four times a day (QID) | INTRAMUSCULAR | Status: DC | PRN
Start: 2017-10-06 — End: 2017-10-07

## 2017-10-06 MED ORDER — ONDANSETRON 4 MG PO TBDP
4.00 mg | ORAL_TABLET | Freq: Four times a day (QID) | ORAL | Status: DC | PRN
Start: 2017-10-06 — End: 2017-10-07

## 2017-10-06 MED ORDER — INSULIN LISPRO 100 UNIT/ML SC SOPN
2.00 [IU] | PEN_INJECTOR | Freq: Three times a day (TID) | SUBCUTANEOUS | Status: DC
Start: 2017-10-07 — End: 2017-10-07
  Administered 2017-10-07: 10:00:00 3 [IU] via SUBCUTANEOUS

## 2017-10-06 MED ORDER — INSULIN LISPRO 100 UNIT/ML SC SOPN
1.00 [IU] | PEN_INJECTOR | Freq: Every evening | SUBCUTANEOUS | Status: DC
Start: 2017-10-06 — End: 2017-10-07
  Administered 2017-10-06: 23:00:00 3 [IU] via SUBCUTANEOUS
  Filled 2017-10-06: qty 3

## 2017-10-06 MED ORDER — GLUCAGON 1 MG IJ SOLR (WRAP)
1.00 mg | INTRAMUSCULAR | Status: DC | PRN
Start: 2017-10-06 — End: 2017-10-07

## 2017-10-06 NOTE — ED Provider Notes (Addendum)
[facility] EMERGENCY DEPARTMENT History and Physical Exam      Patient Name: Samuel Koch, Samuel Koch  Encounter Date:  10/06/2017  Attending Physician: Joesphine Bare, MD  PCP: Patsy Lager, MD  Patient DOB:  11-23-1970  MRN:  [id number]  Room:  RAUG/RAU-G      History of Presenting Illness     Chief complaint: Shortness of Breath    HPI/ROS is limited by: none  HPI/ROS given by: patient and family    Location: General  Duration: Several days with similar s symptoms in the past severity: moderate    Samuel Koch is a 47 y.o. male who presents with increasing dyspnea on exertion.  The patient has history of CHF before.  He has had previous history of ejection fractions in the 30s.  He moved from [place].  He does mission work.  He notes he had recent travel.  He denies any shortness of breath currently.  He notes increasing dyspnea on exertion orthopnea and leg swelling.Marland Kitchen  He has been taking Lasix at home.  He denies any chest pain.  He has history of idiopathic cardiomyopathy.  Patient does have a pacemaker which is been placed.  He is attempt to get into [facility] cardiology was told that if he needed to have further evaluation he may require an echocardiogram and was recommended that he come here to expedite his audiology evaluation.  Patient denies any history of any vomiting denies any diarrhea.  Denies any unintended weight loss.      Review of Systems   Review of Systems   Constitutional: Positive for malaise/fatigue. Negative for chills and fever.   HENT: Negative.    Eyes: Negative for blurred vision and double vision.   Respiratory: Positive for shortness of breath. Negative for cough and hemoptysis.    Cardiovascular: Positive for orthopnea and leg swelling. Negative for chest pain and claudication.   Gastrointestinal: Negative.  Negative for abdominal pain, nausea and vomiting.   Genitourinary: Negative.    Musculoskeletal: Negative.  Negative for joint pain and myalgias.   Skin: Negative.  Negative for rash.    Neurological: Positive for weakness. Negative for dizziness, sensory change, focal weakness, loss of consciousness and headaches.   Endo/Heme/Allergies: Negative.    Psychiatric/Behavioral: Negative.    All other systems reviewed and are negative.          Allergies     Pt has No Known Allergies.    Medications     No current facility-administered medications for this encounter.     Current Outpatient Prescriptions:   .  insulin glargine (LANTUS) 100 UNIT/ML injection, Inject 25 Units into the skin nightly., Disp: 10 mL, Rfl: 0  .  metFORMIN (GLUCOPHAGE) 1000 MG tablet, Take 1 tablet (1,000 mg total) by mouth every morning with breakfast., Disp: 90 tablet, Rfl: 0   Medications were reviewed by md  Past Medical History     Pt  Past Medical History:   Diagnosis Date   . Congestive heart failure    . Diabetes mellitus    . Hypertension      Past medical history was reviewed by md  Past Surgical History     Pt  Past Surgical History:   Procedure Laterality Date   . APPENDECTOMY     . CARDIAC PACEMAKER PLACEMENT       Past surgical history was reviewed by md  Family History     Family History   Problem Relation Age of Onset   .  Heart disease Father    . Hypertension Father    . Heart disease Sister    . Heart attack Sister    . Heart disease Brother    . Diabetes Brother    . Diabetes Paternal Grandmother      The family history was reviewed by md  Social History     Pt  Social History     Social History   . Marital status: Married     Spouse name: N/A   . Number of children: N/A   . Years of education: N/A     Social History Main Topics   . Smoking status: Never Smoker   . Smokeless tobacco: Never Used   . Alcohol use No   . Drug use: No   . Sexual activity: Not Asked     Other Topics Concern   . None     Social History Narrative   . None     Social history reviewed by md  Physical Exam     Blood pressure (!) 152/105, pulse 88, temperature 98.8 F (37.1 C), temperature source Oral, resp. rate 20, height 1.88 m,  weight 144 kg, SpO2 97 %.    Physical Exam   Constitutional: He is oriented to person, place, and time and well-developed, well-nourished, and in no distress. No distress.   Patient is sitting upright. They make good eye contact. They are interactive. They are conversing without difficulty and in no acute apparent distress.     HENT:   Head: Normocephalic and atraumatic.   Right Ear: External ear normal.   Left Ear: External ear normal.   Nose: Nose normal.   Mouth/Throat: Oropharynx is clear and moist.   Eyes: Pupils are equal, round, and reactive to light. Conjunctivae and EOM are normal.   Neck: Normal range of motion. Neck supple. No JVD present.   Cardiovascular: Normal rate, regular rhythm, normal heart sounds and intact distal pulses.  Exam reveals no gallop and no friction rub.    No murmur heard.  Regular rhythm without murmur rubs or gallops. No bruits. Good peripheral pulses. Good cap refill. Symmetrical pulses. No abdominal bruits     Pulmonary/Chest: Effort normal and breath sounds normal. No stridor. No respiratory distress. He has no wheezes. He has no rales.   Abdominal: Soft. Bowel sounds are normal. He exhibits no distension. There is no tenderness. There is no rebound and no guarding.   Musculoskeletal: Normal range of motion. He exhibits edema. He exhibits no tenderness.   Trace edema nonpitting   Neurological: He is alert and oriented to person, place, and time. He has normal reflexes. No cranial nerve deficit. He exhibits normal muscle tone. Gait normal. Coordination normal. GCS score is 15.   Skin: Skin is warm and dry. No rash noted. He is not diaphoretic. No erythema. No pallor.   Psychiatric: Mood, memory, affect and judgment normal.   Nursing note and vitals reviewed.          Orders Placed     Orders Placed This Encounter   Procedures   . XR Chest 2 Views   . CMP   . CBC   . D-dimer, quantitative   . Troponin I (Stat)   . BNP   . ECG 12 lead (Stat)   . ECG 12 lead (Specify reason for  exam)   . ED Admission Request       Diagnostic Results       The results of the  diagnostic studies below have been reviewed by myself:    Labs  Results     Procedure Component Value Units Date/Time    Troponin I (Stat) [[id number]] Collected:  10/06/17 2018    Specimen:  Plasma Updated:  10/06/17 2110     Troponin I 0.02 ng/mL     CMP [[id number]]  (Abnormal) Collected:  10/06/17 2018    Specimen:  Plasma Updated:  10/06/17 2109     Sodium 136 mMol/L      Potassium 4.2 mMol/L      Chloride 102 mMol/L      CO2 18.6 (L) mMol/L      Calcium 9.5 mg/dL      Glucose 295 (H) mg/dL      Creatinine 2.84 mg/dL      BUN 25 (H) mg/dL      Protein, Total 7.2 gm/dL      Albumin 3.7 gm/dL      Alkaline Phosphatase 89 U/L      ALT 19 U/L      AST (SGOT) 13 U/L      Bilirubin, Total 0.2 mg/dL      Albumin/Globulin Ratio 1.06 Ratio      Anion Gap 19.6 (H) mMol/L      BUN/Creatinine Ratio 21.4 Ratio      EGFR 73 mL/min/1.22m2      Osmolality Calc 288 mOsm/kg      Globulin 3.5 gm/dL     D-dimer, quantitative [[id number]] Collected:  10/06/17 2018    Specimen:  Blood Updated:  10/06/17 2059     D-Dimer 0.26 mg/L FEU     CBC [[id number]] Collected:  10/06/17 2018    Specimen:  Blood from Blood Updated:  10/06/17 2057     WBC 7.3 K/cmm      RBC 4.61 M/cmm      Hemoglobin 14.4 gm/dL      Hematocrit 34.7 %      MCV 90 fL      MCH 31 pg      MCHC 35 gm/dL      RDW 42.5 %      PLT CT 238 K/cmm      MPV 8.9 fL      NEUTROPHIL % 56.1 %      Lymphocytes 34.0 %      Monocytes 7.1 %      Eosinophils % 1.6 %      Basophils % 1.2 %      Neutrophils Absolute 4.1 K/cmm      Lymphocytes Absolute 2.5 K/cmm      Monocytes Absolute 0.5 K/cmm      Eosinophils Absolute 0.1 K/cmm      BASO Absolute 0.1 K/cmm           Radiologic Studies  Radiology Results (24 Hour)     Procedure Component Value Units Date/Time    XR Chest 2 Views [[id number]] Collected:  10/06/17 2033    Order Status:  Completed Updated:  10/06/17 2034    Narrative:       CLINICAL  HISTORY:  Shortness of Breath   47 years  old Male     PROCEDURE: XR CHEST 2 VIEWS    COMPARISON: None    FINDINGS:  No pleuroparenchymal disease identified.  Cardiac and mediastinal silhouette are normal.        Impression:       IMPRESSION:   Normal study.    ReadingStation:[id number]          EKG:  See Epiphany for formal interpretation.      MDM / Critical Care     Blood pressure (!) 152/105, pulse 88, temperature 98.8 F (37.1 C), temperature source Oral, resp. rate 20, height 1.88 m, weight 144 kg, SpO2 97 %.    Patient's presentation is concerning for acute congestive heart failure. It is necessary to consider ischemic etiology. There is no strong evidence to support pulmonary embolism, dissection, endocarditis, myocarditis, pericarditis. In light of the patient's presentation they will be treated aggressively. Serial enzymes will be considered in initial enzymes will be obtained here in the emergency department. Patient was given IV medications for potential diuresis and consideration will be given for nitrates. There is no sign or evidence to strongly support pending airway compromise or emergent need for intubation at this juncture. They'll be aggressively treated here number department. Consideration be given for admission. It is necessary to consider dysrhythmia. Electrolytes will be reviewed.    The patient did have improvement while here in the emergency department. Options including admission were discussed with the patient. The plan was also discussed and they were agreeable to pursue that plan and disposition.        Procedures             Diagnosis / Disposition     Clinical Impression  1. CHF with unknown LVEF        Disposition  ED Disposition     ED Disposition Condition Date/Time Comment    Admit  Mon Oct 06, 2017  9:27 PM           Prescriptions  New Prescriptions    No medications on file                  Joesphine Bare, MD  10/06/17 2143       Joesphine Bare, MD  10/06/17 [phone]

## 2017-10-06 NOTE — H&P (Signed)
[facility]  OBSERVATION UNIT  HISTORY AND PHYSICAL       Patient: Samuel Koch  Admission Date: 10/06/2017    DOB: [birth year]  Age: 47 y.o.    MRN: [id number]  Sex: male    PCP: Patsy Lager, MD  Attending: Joesphine Bare, MD         HISTORY OF PRESENT ILLNESS     Samuel Koch is a 47 y.o. male who presented with shortness of breath and dyspnea on exertion which has been progressively worsening over the past 2 months.  His legs have been swelling.  Patient has extensive cardiac history including CHF, cardiomyopathy he has a permanent pacemaker.  States he was attempting to make lifestyle modifications by eating better and going to the gym.  He has lost over 20 pounds in 6 weeks.  He was up to running 1.5 miles on the treadmill.  The past 2 weeks has noticed increased generalized fatigue as well as fatigue in his legs while he runs.  He has not been able to go to the gym over the past week due to his shortness of breath. Has been prescribed PRN Lasix, states, which he has not taken in over 6 months.  However he has taken it twice this week.  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  Denies fever, chills, cough, HA, dizziness, abdominal pain, N/V/D,rash.     [place] established with a cardiologist at [facility].  His wife states he attempted to establish care at a local cardiologist.  She called for an appointment but was told there was extensive wait time for new patients-although I see in Epic he has a new patient appointment with the CHF clinic on 10/08/2017.  She was instructed to bring the patient to the ED for evaluation.  Also has not established a primary care provider in this area.    Workup in the ED including ECG, d-dimer, and initial Trop-I has been unremarkable.  The patient has agreed to transfer to the Observation Unit for completion of cardiac workup with serial  cardiac enzymes, echocardiogram in the a.m.    PAST MEDICAL HISTORY     Code Status: Prior    Past Medical History:   Diagnosis Date   . Congestive heart failure    . Diabetes mellitus    . Hypertension        Past Surgical History:   Procedure Laterality Date   . APPENDECTOMY     . CARDIAC PACEMAKER PLACEMENT         Current/Home Medications    INSULIN GLARGINE (LANTUS) 100 UNIT/ML INJECTION    Inject 25 Units into the skin nightly.    METFORMIN (GLUCOPHAGE) 1000 MG TABLET    Take 1 tablet (1,000 mg total) by mouth every morning with breakfast.       Allergies: No Known Allergies    Family History   Problem Relation Age of Onset   . Heart disease Father    . Hypertension Father    . Heart disease Sister    . Heart attack Sister    . Heart disease Brother    . Diabetes Brother    . Diabetes Paternal Grandmother        SHx:  reports that he has never smoked. He has never used smokeless tobacco. He reports that he does not  drink alcohol or use drugs.    REVIEW OF SYSTEMS     A complete 12 point review of systems was completed and was negative except as noted in the HPI.     PHYSICAL EXAM     Vital Signs: Blood pressure (!) 152/105, pulse 88, temperature 98.8 F (37.1 C), temperature source Oral, resp. rate 20, height 1.88 m (6\' 2" ), weight 144 kg (317 lb 7.4 oz), SpO2 97 %.    Physical Exam   Constitutional: He is oriented to person, place, and time. He appears well-developed and well-nourished. No distress.   Patient sitting upright in chair in ED bed RAU-G.  NAD.  He speaks in complete sentences without difficulty.  Wife is at bedside.   HENT:   Head: Normocephalic and atraumatic.   Eyes: Pupils are equal, round, and reactive to light. Conjunctivae and EOM are normal.   Neck: Normal range of motion. Neck supple. No JVD present. No tracheal deviation present.   Cardiovascular: Normal rate, regular rhythm and intact distal pulses.    Pulmonary/Chest: Effort normal and breath sounds normal. No respiratory distress. He  exhibits no tenderness.   Abdominal: Soft. Bowel sounds are normal. He exhibits no distension. There is no tenderness.   Musculoskeletal: Normal range of motion. He exhibits edema. He exhibits no tenderness.   nonpitting edema in lower extremities bilaterally.   Neurological: He is alert and oriented to person, place, and time.   Skin: Skin is warm and dry. No rash noted.   Psychiatric: He has a normal mood and affect. His behavior is normal.       LABS & IMAGING     Labs:  Results for orders placed or performed during the hospital encounter of 10/06/17   CMP   Result Value Ref Range    Sodium 136 136 - 147 mMol/L    Potassium 4.2 3.5 - 5.3 mMol/L    Chloride 102 98 - 110 mMol/L    CO2 18.6 (L) 20.0 - 30.0 mMol/L    Calcium 9.5 8.5 - 10.5 mg/dL    Glucose 119 (H) 71 - 99 mg/dL    Creatinine 1.47 8.29 - 1.30 mg/dL    BUN 25 (H) 7 - 22 mg/dL    Protein, Total 7.2 6.0 - 8.3 gm/dL    Albumin 3.7 3.5 - 5.0 gm/dL    Alkaline Phosphatase 89 40 - 145 U/L    ALT 19 0 - 55 U/L    AST (SGOT) 13 10 - 42 U/L    Bilirubin, Total 0.2 0.1 - 1.2 mg/dL    Albumin/Globulin Ratio 1.06 0.80 - 2.00 Ratio    Anion Gap 19.6 (H) 7.0 - 18.0 mMol/L    BUN/Creatinine Ratio 21.4 10.0 - 30.0 Ratio    EGFR 73 60 - 150 mL/min/1.53m2    Osmolality Calc 288 275 - 300 mOsm/kg    Globulin 3.5 2.0 - 4.0 gm/dL   CBC   Result Value Ref Range    WBC 7.3 4.0 - 11.0 K/cmm    RBC 4.61 4.00 - 5.70 M/cmm    Hemoglobin 14.4 13.0 - 17.5 gm/dL    Hematocrit 56.2 13.0 - 52.5 %    MCV 90 80 - 100 fL    MCH 31 28 - 35 pg    MCHC 35 32 - 36 gm/dL    RDW 86.5 78.4 - 69.6 %    PLT CT 238 130 - 440 K/cmm    MPV 8.9 6.0 - 10.0 fL  NEUTROPHIL % 56.1 42.0 - 78.0 %    Lymphocytes 34.0 15.0 - 46.0 %    Monocytes 7.1 3.0 - 15.0 %    Eosinophils % 1.6 0.0 - 7.0 %    Basophils % 1.2 0.0 - 3.0 %    Neutrophils Absolute 4.1 1.7 - 8.6 K/cmm    Lymphocytes Absolute 2.5 0.6 - 5.1 K/cmm    Monocytes Absolute 0.5 0.1 - 1.7 K/cmm    Eosinophils Absolute 0.1 0.0 - 0.8 K/cmm    BASO  Absolute 0.1 0.0 - 0.3 K/cmm   D-dimer, quantitative   Result Value Ref Range    D-Dimer 0.26 0.19 - 0.52 mg/L FEU   Troponin I (Stat)   Result Value Ref Range    Troponin I 0.02 0.00 - 0.02 ng/mL       Imaging:  XR Chest 2 Views   Final Result   IMPRESSION:    Normal study.      ReadingStation:[id number]              EMERGENCY DEPARTMENT COURSE     ED Medication Orders     None          ED documentation including nurses notes were reviewed by myself.  The case was discussed with the ED Attending.    ASSESSMENT & PLAN     Samuel Koch is a 46 y.o. male admitted under OBSERVATION with shortness of breath, dyspnea on exertion.    Assessment & Plan:       Admit to Observation Unit for cardiac work up and treatment of CHF/cardiomyopathy.   -Add on BNP  -Telemetry  -Serial cardiac enzymes.    -Repeat ECG  -Diruetics  -AM Labs  -2D Echo In AM  -Cardiac Consultation PRN  -Referred to CHF clinic upon discharge per epic patient has appointment 10/08/2017  -Patient will need to establish primary care provider upon discharge-transition clinic        I certify the need for admission to the Observation Unit based on the patient's history and the information above.    Jacklynn Bue, [place]   10/06/2017 9:55 PM

## 2017-10-07 ENCOUNTER — Telehealth: Payer: Self-pay

## 2017-10-07 ENCOUNTER — Observation Stay: Payer: 59

## 2017-10-07 LAB — CBC AND DIFFERENTIAL
Basophils %: 1 % (ref 0.0–3.0)
Basophils Absolute: 0.1 10*3/uL (ref 0.0–0.3)
Eosinophils %: 1.8 % (ref 0.0–7.0)
Eosinophils Absolute: 0.1 10*3/uL (ref 0.0–0.8)
Hematocrit: 40.4 % (ref 39.0–52.5)
Hemoglobin: 14 gm/dL (ref 13.0–17.5)
Lymphocytes Absolute: 2.8 10*3/uL (ref 0.6–5.1)
Lymphocytes: 43.7 % (ref 15.0–46.0)
MCH: 31 pg (ref 28–35)
MCHC: 35 gm/dL (ref 32–36)
MCV: 90 fL (ref 80–100)
MPV: 8.6 fL (ref 6.0–10.0)
Monocytes Absolute: 0.5 10*3/uL (ref 0.1–1.7)
Monocytes: 7.2 % (ref 3.0–15.0)
Neutrophils %: 46.3 % (ref 42.0–78.0)
Neutrophils Absolute: 3 10*3/uL (ref 1.7–8.6)
PLT CT: 225 10*3/uL (ref 130–440)
RBC: 4.49 10*6/uL (ref 4.00–5.70)
RDW: 11 % (ref 11.0–14.0)
WBC: 6.4 10*3/uL (ref 4.0–11.0)

## 2017-10-07 LAB — BASIC METABOLIC PANEL
Anion Gap: 15.3 mMol/L (ref 7.0–18.0)
BUN / Creatinine Ratio: 23.2 Ratio (ref 10.0–30.0)
BUN: 26 mg/dL — ABNORMAL HIGH (ref 7–22)
CO2: 21.7 mMol/L (ref 20.0–30.0)
Calcium: 9.1 mg/dL (ref 8.5–10.5)
Chloride: 104 mMol/L (ref 98–110)
Creatinine: 1.12 mg/dL (ref 0.80–1.30)
EGFR: 77 mL/min/{1.73_m2} (ref 60–150)
Glucose: 312 mg/dL — ABNORMAL HIGH (ref 71–99)
Osmolality Calc: 290 mOsm/kg (ref 275–300)
Potassium: 4 mMol/L (ref 3.5–5.3)
Sodium: 137 mMol/L (ref 136–147)

## 2017-10-07 LAB — VH DEXTROSE STICK GLUCOSE
Glucose POCT: 317 mg/dL — ABNORMAL HIGH (ref 71–99)
Glucose POCT: 217 mg/dL — ABNORMAL HIGH (ref 71–99)

## 2017-10-07 LAB — TROPONIN I
Troponin I: 0.01 ng/mL (ref 0.00–0.02)
Troponin I: 0.01 ng/mL (ref 0.00–0.02)

## 2017-10-07 MED ORDER — INSULIN LISPRO 100 UNIT/ML SC SOPN
8.00 [IU] | PEN_INJECTOR | Freq: Three times a day (TID) | SUBCUTANEOUS | 10 refills | Status: DC
Start: 2017-10-07 — End: 2018-03-30

## 2017-10-07 MED ORDER — INSULIN GLARGINE 100 UNIT/ML SC SOPN
25.00 [IU] | PEN_INJECTOR | Freq: Every evening | SUBCUTANEOUS | 10 refills | Status: DC
Start: 2017-10-07 — End: 2018-03-30

## 2017-10-07 MED ORDER — VH PERFLUTREN LIPID MICROSPHERE 6.52 MG/ML IV SUSP
INTRAVENOUS | Status: AC
Start: 2017-10-07 — End: ?
  Filled 2017-10-07: qty 2

## 2017-10-07 MED ORDER — VH PERFLUTREN LIPID MICROSPHERE 6.52 MG/ML IV SUSP
Freq: Once | INTRAVENOUS | Status: AC
Start: 2017-10-07 — End: 2017-10-07
  Administered 2017-10-07: 09:00:00 2 mL via INTRAVENOUS

## 2017-10-07 MED ORDER — INSULIN PEN NEEDLE 30G X 5 MM MISC
2 refills | Status: DC
Start: 2017-10-07 — End: 2018-03-26

## 2017-10-07 NOTE — UM Notes (Signed)
[facility] Utilization Management Review Sheet    Facility :  [facility]    NAME: Samuel Koch  MR#: [id number]    CSN#: [id number]    ROOM: 2512/2512-A AGE: 47 y.o.    ADMIT DATE AND TIME: 10/06/2017  8:00 PM    PATIENT CLASS: OBSERVATION  10/06/17 @ 2149 ADMIT OBSERVATION OBS UNIT    ATTENDING PHYSICIAN: Joesphine Bare, MD  PAYOR:Payor: Cherlyn Labella / Plan: Jackolyn Confer EXCHANGE / Product Type: BCBS /     AUTH #: NPR    DIAGNOSIS:     ICD-10-CM    1. CHF with unknown LVEF I50.9      HISTORY:   Past Medical History:   Diagnosis Date   . Congestive heart failure    . Diabetes mellitus    . Hypertension      DATE OF REVIEW: 10/07/2017    Active Hospital Problems    Diagnosis   . Obesity, morbid, BMI 40.0-49.9   . DOE (dyspnea on exertion)     DATE OF ED TREATMENT- 10/06/17     OBSERVATION REVIEW    HPI: Presents with shortness of breath and dyspnea on exertion which has been progressively worsening over the past 2 months.  His legs have been swelling.  Patient has extensive cardiac history including CHF, cardiomyopathy he has a permanent pacemaker.  States he was attempting to make lifestyle modifications by eating better and going to the gym.  He has lost over 20 pounds in 6 weeks.  He was up to running 1.5 miles on the treadmill.  The past 2 weeks has noticed increased generalized fatigue as well as fatigue in his legs while he runs.  He has not been able to go to the gym over the past week due to his shortness of breath.      ABNORMAL LABS: CO2 18.6 BUN 25  EKG ATRIAL PACED    DIAGNOSTIC TESTING:XR CHEST - Normal study    VS: Blood pressure (!) 152/105, pulse 88, temperature 98.8 F (37.1 C), temperature source Oral, resp. rate 20, height 1.88 m, weight 144 kg, SpO2 97 %.    ABNORMAL FINDINGS: Trace edema nonpitting     ASSESSMENT AND PLAN:  SOB/DOE  PLAN: TELEMETRY, SERIAL CE'S, ECHO, DIURETICS     OBSERVATION ORDERS   VS Q 4 HRS, LOVENOX SQ QD, CONTINUE HOME MEDS, GLUC POCT AC AND HS    DAY 2  OBSERVATION FOLLOW-UP  10/07/17  LABS: GLUC 312 TROPS NEG  DIAGNOSTIC TESTING: ECHO- The left ventricular cavity size is mildly increased. 2: The left ventricular ejection fraction is low normal, estimated at 50%.  3: Mild hypokinesis of the distal septum and apex which may be due to paced rhythm. 4: A pacer wire is present in the right ventricle. 5: The estimated pulmonary arterial pressure is normal.   CURRENT ORDERS AS ABOVE  [place] [facility]    Wende Mott, RN  Utilization Management  Case Management Department    [facility]  [address]  [place], [place] [address]  T [phone]    F [phone]   [email or web address]

## 2017-10-07 NOTE — Telephone Encounter (Signed)
Reminder call to patient of upcoming appointment on 9/4. Left message

## 2017-10-07 NOTE — Progress Notes (Signed)
Nutrition Note:      Height:  1.88 m (6\' 2" )  Weight:  144 kg (317 lb 7.4 oz)  BMI:  Body mass index is 40.76 kg/m.    Patient meets criteria for Morbid Obesity with BMI > [address] [place], [place]  10/07/2017 6:22 AM

## 2017-10-07 NOTE — Plan of Care (Signed)
Problem: Moderate/High Fall Risk Score >5  Goal: Patient will remain free of falls  Outcome: Progressing   10/06/17 2220   High Risk Falls Interventions (Greater than 13)   VH High Risk (Greater than 13) ALL REQUIRED LOW INTERVENTIONS;ALL REQUIRED MODERATE INTERVENTIONS;RED "HIGH FALL RISK" SIGNAGE;PATIENT IS TO BE SUPERVISED FOR ALL TOILETING ACTIVITIES       Problem: Hemodynamic Status: Cardiac  Goal: Stable vital signs and fluid balance  Outcome: Progressing   10/07/17 0013   Goal/Interventions addressed this shift   Stable vital signs and fluid balance Monitor/assess vital signs and telemetry per unit protocol;Assess signs and symptoms associated with cardiac rhythm changes;Monitor lab values;Monitor for leg swelling/edema and report to LIP if abnormal       Problem: Inadequate Tissue Perfusion  Goal: Adequate tissue perfusion will be maintained  Outcome: Progressing   10/07/17 0013   Goal/Interventions addressed this shift   Adequate tissue perfusion will be maintained Monitor/assess vital signs;Monitor/assess lab values and report abnormal values;Monitor/assess neurovascular status (pulses, capillary refill, pain, paresthesia, paralysis, presence of edema);Monitor/assess for signs of VTE (edema of calf/thigh redness, pain);Monitor for signs and symptoms of a pulmonary embolism (dyspnea, tachypnea, tachycardia, confusion);VTE Prevention: Administer anticoagulant(s) and/or apply anti-embolism stockings/devices as ordered;Encourage/assist patient as needed to turn, cough, and perform deep breathing every 2 hours;Reinforce use of ordered respiratory interventions (i.e. CPAP, BiPAP, Incentive Spirometer, Acapella, etc.);Perform active/passive ROM;Reinforce ankle pump exercises;Increase mobility as tolerated/progressive mobility;Elevate feet;Position patient for maximum circulation/cardiac output;Place shoes or other foot protection on patient;Assess and monitor skin integrity       Problem: Ineffective Gas  Exchange  Goal: Effective breathing pattern  Outcome: Progressing   10/07/17 0013   Goal/Interventions addressed this shift   Effective breathing pattern Maintain O2 saturation level per LIP order;Teach/reinforce use of ordered respiratory interventions (ie. CPAP, BiPAP, Incentive Spirometer, Acapella);Monitor for sleep apnea;Monitor for medication induced respiratory depression       Problem: Impaired Mobility  Goal: Mobility/Activity is maintained at optimal level for patient  Outcome: Progressing   10/07/17 0013   Goal/Interventions addressed this shift   Mobility/activity is maintained at optimal level for patient Increase mobility as tolerated/progressive mobility;Encourage independent activity per ability;Maintain proper body alignment;Perform active/passive ROM;Plan activities to conserve energy, plan rest periods;Reposition patient every 2 hours and as needed unless able to reposition self;Assess for changes in respiratory status, level of consciousness and/or development of fatigue

## 2017-10-07 NOTE — Discharge Instr - AVS First Page (Addendum)
Continue to work on glycemic control.    Once established with PCP, have them do PFT's in the office.     Keep Heart Failure Clinic appointment.

## 2017-10-07 NOTE — Discharge Summary (Signed)
[facility]  OBSERVATION UNIT  DISCHARGE SUMMARY      Patient: Samuel Koch  Admission Date: 10/06/2017  8:00 PM   DOB: 04/26/1970  Discharge Date: 10/07/17 10:52 AM   MRN: [id number]  Primary Care: Patsy Lager, MD         Presentation     Samuel Koch is a 47 y.o. male who presented with   shortness of breath and dyspnea on exertion which has been progressively worsening over the past 2 months.  His legs have been swelling.  Patient has extensive cardiac history including CHF, cardiomyopathy he has a permanent pacemaker.  States he was attempting to make lifestyle modifications by eating better and going to the gym.  He has lost over 20 pounds in 6 weeks.  He was up to running 1.5 miles on the treadmill.  The past 2 weeks has noticed increased generalized fatigue as well as fatigue in his legs while he runs.  He has not been able to go to the gym over the past week due to his shortness of breath. Has been prescribed PRN Lasix, states, which he has not taken in over 6 months.  However he has taken it twice this week.  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  Denies fever, chills, cough, HA, dizziness, abdominal pain, N/V/D,rash.     Patient recently relocated to this area from [place] where he was established with a cardiologist at [facility].  His wife states he attempted to establish care at a local cardiologist.  She called for an appointment but was told there was extensive wait time for new patients-although I see in Epic he has a new patient appointment with the CHF clinic on 10/08/2017.  She was instructed to bring the patient to the ED for evaluation.  Also has not established a primary care provider in this area.    Workup in the ED including ECG, d-dimer, and initial Trop-I has been unremarkable.  The patient has agreed to transfer to the Observation Unit for completion of cardiac workup with serial cardiac enzymes,  echocardiogram in the a.m.  Patient was admitted to the Observation Unit with a diagnosis of   Patient Active Problem List   Diagnosis   . Near syncope   . Hyperglycemia   . Essential hypertension   . Combined systolic and diastolic congestive heart failure   . Dehydration, moderate   . Anemia, unspecified type   . Renal insufficiency   . DOE (dyspnea on exertion)   . Obesity, morbid, BMI 40.0-49.9              Hospital Course     Vitals:    10/07/17 0755   BP: (!) 163/95   Pulse: 73   Resp: 18   Temp: 97.9 F (36.6 C)   SpO2: 98%     Patient was transferred observation and monitored on cardiac telemetry. His d-dimer was negative. All 3 cardiac troponins were all normal. He had no arrhythmias on the monitor. Hemoglobin A1c returned at 10.7. He went for echocardiogram in morning and findings were left ventricular cavity size mildly creased with low normal EF 50%. Mild hypokinesis of the distal septum and apex which may be due to paced rhythm. Patient was provided results of his echocardiogram. Diabetic educator spoke with the patient about medications and needs. He did request that generic insulins be prescribed and this was done.  This provider spoke at length about what needs the patient needs as he is new to the area. He declined primary care list as his wife states that they have just gotten her parents in to a PCP office and they may wish to go to that one. He does have a heart failure clinic appointment tomorrow and he will keep that appointment.       Physical Exam   Constitutional: He appears healthy.   Eyes: Pupils are equal, round, and reactive to light.   Neck: Normal range of motion.   Cardiovascular: Normal rate, regular rhythm, S1 normal, S2 normal, normal heart sounds, intact distal pulses and normal pulses.    Pulmonary/Chest: Effort normal and breath sounds normal.   Abdominal: Soft. Bowel sounds are normal.   Musculoskeletal: Normal range of motion.   Neurological: He is alert and oriented to  person, place, and time.   Skin: Skin is warm and dry.       Ancillary Studies     Labs:  Results for orders placed or performed during the hospital encounter of 10/06/17   CMP   Result Value Ref Range    Sodium 136 136 - 147 mMol/L    Potassium 4.2 3.5 - 5.3 mMol/L    Chloride 102 98 - 110 mMol/L    CO2 18.6 (L) 20.0 - 30.0 mMol/L    Calcium 9.5 8.5 - 10.5 mg/dL    Glucose 914 (H) 71 - 99 mg/dL    Creatinine 7.82 9.56 - 1.30 mg/dL    BUN 25 (H) 7 - 22 mg/dL    Protein, Total 7.2 6.0 - 8.3 gm/dL    Albumin 3.7 3.5 - 5.0 gm/dL    Alkaline Phosphatase 89 40 - 145 U/L    ALT 19 0 - 55 U/L    AST (SGOT) 13 10 - 42 U/L    Bilirubin, Total 0.2 0.1 - 1.2 mg/dL    Albumin/Globulin Ratio 1.06 0.80 - 2.00 Ratio    Anion Gap 19.6 (H) 7.0 - 18.0 mMol/L    BUN/Creatinine Ratio 21.4 10.0 - 30.0 Ratio    EGFR 73 60 - 150 mL/min/1.57m2    Osmolality Calc 288 275 - 300 mOsm/kg    Globulin 3.5 2.0 - 4.0 gm/dL   CBC   Result Value Ref Range    WBC 7.3 4.0 - 11.0 K/cmm    RBC 4.61 4.00 - 5.70 M/cmm    Hemoglobin 14.4 13.0 - 17.5 gm/dL    Hematocrit 21.3 08.6 - 52.5 %    MCV 90 80 - 100 fL    MCH 31 28 - 35 pg    MCHC 35 32 - 36 gm/dL    RDW 57.8 46.9 - 62.9 %    PLT CT 238 130 - 440 K/cmm    MPV 8.9 6.0 - 10.0 fL    NEUTROPHIL % 56.1 42.0 - 78.0 %    Lymphocytes 34.0 15.0 - 46.0 %    Monocytes 7.1 3.0 - 15.0 %    Eosinophils % 1.6 0.0 - 7.0 %    Basophils % 1.2 0.0 - 3.0 %    Neutrophils Absolute 4.1 1.7 - 8.6 K/cmm    Lymphocytes Absolute 2.5 0.6 - 5.1 K/cmm    Monocytes Absolute 0.5 0.1 - 1.7 K/cmm    Eosinophils Absolute 0.1 0.0 - 0.8 K/cmm    BASO Absolute 0.1 0.0 - 0.3 K/cmm   D-dimer, quantitative   Result Value Ref Range  D-Dimer 0.26 0.19 - 0.52 mg/L FEU   Troponin I (Stat)   Result Value Ref Range    Troponin I 0.02 0.00 - 0.02 ng/mL   BNP   Result Value Ref Range    B-Natriuretic Peptide 43.5 0.0 - 100.0 pg/mL   Hemoglobin A1C   Result Value Ref Range    Hgb A1C, % 10.7 %   Dextrose Stick Glucose   Result Value Ref Range     Glucose, POCT 264 (H) 71 - 99 mg/dL   Troponin I   Result Value Ref Range    Troponin I 0.01 0.00 - 0.02 ng/mL   CBC with differential   Result Value Ref Range    WBC 6.4 4.0 - 11.0 K/cmm    RBC 4.49 4.00 - 5.70 M/cmm    Hemoglobin 14.0 13.0 - 17.5 gm/dL    Hematocrit 59.5 63.8 - 52.5 %    MCV 90 80 - 100 fL    MCH 31 28 - 35 pg    MCHC 35 32 - 36 gm/dL    RDW 75.6 43.3 - 29.5 %    PLT CT 225 130 - 440 K/cmm    MPV 8.6 6.0 - 10.0 fL    NEUTROPHIL % 46.3 42.0 - 78.0 %    Lymphocytes 43.7 15.0 - 46.0 %    Monocytes 7.2 3.0 - 15.0 %    Eosinophils % 1.8 0.0 - 7.0 %    Basophils % 1.0 0.0 - 3.0 %    Neutrophils Absolute 3.0 1.7 - 8.6 K/cmm    Lymphocytes Absolute 2.8 0.6 - 5.1 K/cmm    Monocytes Absolute 0.5 0.1 - 1.7 K/cmm    Eosinophils Absolute 0.1 0.0 - 0.8 K/cmm    BASO Absolute 0.1 0.0 - 0.3 K/cmm   Basic Metabolic Panel   Result Value Ref Range    Sodium 137 136 - 147 mMol/L    Potassium 4.0 3.5 - 5.3 mMol/L    Chloride 104 98 - 110 mMol/L    CO2 21.7 20.0 - 30.0 mMol/L    Calcium 9.1 8.5 - 10.5 mg/dL    Glucose 188 (H) 71 - 99 mg/dL    Creatinine 4.16 6.06 - 1.30 mg/dL    BUN 26 (H) 7 - 22 mg/dL    Anion Gap 30.1 7.0 - 18.0 mMol/L    BUN/Creatinine Ratio 23.2 10.0 - 30.0 Ratio    EGFR 77 60 - 150 mL/min/1.16m2    Osmolality Calc 290 275 - 300 mOsm/kg   Troponin I   Result Value Ref Range    Troponin I 0.01 0.00 - 0.02 ng/mL   Dextrose Stick Glucose   Result Value Ref Range    Glucose, POCT 317 (H) 71 - 99 mg/dL   Dextrose Stick Glucose   Result Value Ref Range    Glucose, POCT 217 (H) 71 - 99 mg/dL       Radiology:  ECHOCARDIOGRAM ADULT WITH CONTRAST COMP W CLR/DOP   Final Result      XR Chest 2 Views   Final Result   IMPRESSION:    Normal study.      ReadingStation:[id number]          Stress Test Results:          Disposition     Diagnosis:   Patient Active Problem List    Diagnosis Date Noted   . Obesity, morbid, BMI 40.0-49.9 10/07/2017   . DOE (dyspnea on exertion) 10/06/2017   .  Hyperglycemia 03/29/2017   .  Essential hypertension 03/29/2017   . Combined systolic and diastolic congestive heart failure 03/29/2017   . Dehydration, moderate 03/29/2017   . Anemia, unspecified type 03/29/2017   . Renal insufficiency 03/29/2017   . Near syncope 03/28/2017       Past Medical History:   Diagnosis Date   . Congestive heart failure    . Diabetes mellitus    . Hypertension        Discharge to: Home    Condition: Stable        Medication(s):  Current Discharge Medication List      CONTINUE these medications which have NOT CHANGED    Details   furosemide (LASIX) 40 MG tablet Take 40 mg by mouth as needed      insulin glargine (LANTUS) 100 UNIT/ML injection Inject 25 Units into the skin nightly.  Qty: 10 mL, Refills: 0      metFORMIN (GLUCOPHAGE) 1000 MG tablet Take 1 tablet (1,000 mg total) by mouth every morning with breakfast.  Qty: 90 tablet, Refills: 0             Current Discharge Medication List      START taking these medications    Details   insulin glargine 100 UNIT/ML injection pen Inject 25 Units into the skin nightly  Qty: 1 pen, Refills: 10      insulin lispro (HUMALOG) 100 UNIT/ML injection pen Inject 8 Units into the skin 3 (three) times daily before meals  Qty: 1 pen, Refills: 10             Follow Up:  Follow-up Information     Pcp, [place], MD .

## 2017-10-07 NOTE — Consults (Signed)
Provided literature.  Reviewed role of insulin and Janumet, normo-, hyper- and hypoglycemia.  Identified sources and distribution of CHO.  Encouraged limiting sodium to 2000 mg/day.  Discussed options for insulin, provided phone number for [employer] assistance.  Encouraged outpt education.  Phone number given if questions arise.

## 2017-10-08 ENCOUNTER — Ambulatory Visit: Payer: 59 | Attending: Family Nurse Practitioner | Admitting: Family Nurse Practitioner

## 2017-10-08 VITALS — BP 146/92 | HR 84 | Resp 18 | Wt 314.0 lb

## 2017-10-08 DIAGNOSIS — N289 Disorder of kidney and ureter, unspecified: Secondary | ICD-10-CM

## 2017-10-08 DIAGNOSIS — I480 Paroxysmal atrial fibrillation: Secondary | ICD-10-CM

## 2017-10-08 DIAGNOSIS — I5042 Chronic combined systolic (congestive) and diastolic (congestive) heart failure: Secondary | ICD-10-CM

## 2017-10-08 MED ORDER — METFORMIN HCL 1000 MG PO TABS
1000.00 mg | ORAL_TABLET | Freq: Every morning | ORAL | 0 refills | Status: DC
Start: 2017-10-08 — End: 2017-10-08

## 2017-10-08 MED ORDER — METOPROLOL SUCCINATE ER 25 MG PO TB24
25.00 mg | ORAL_TABLET | Freq: Every evening | ORAL | 11 refills | Status: DC
Start: 2017-10-08 — End: 2018-03-26

## 2017-10-08 MED ORDER — METFORMIN HCL 500 MG PO TABS
1000.00 mg | ORAL_TABLET | Freq: Every morning | ORAL | 11 refills | Status: DC
Start: 2017-10-08 — End: 2018-04-06

## 2017-10-08 NOTE — Patient Instructions (Addendum)
Heart failure causes many symptoms.   Some of them are more serious than others.   It is important to recognize when you should call 911 for emergency help and when you should call your doctor or nurse for urgent attention.    The following lists can help you decide what to do when you experience certain symptoms. You may want to print this page and keep it in a convenient spot.    Emergency Symptoms of Heart Failure  Call 911 for emergency help, if you have:    -Chest discomfort or pain that lasts more than 15 minutes that is not relieved with rest or nitroglycerin.  -Severe, persistent shortness of breath.  -Fainted or passed out.    Urgent Symptoms of Heart Failure  Call your doctor immediately, if you have any of the following symptoms:    -Increasing shortness of breath or a new shortness of breath while resting.  -Trouble sleeping due to difficulty breathing. For example, waking up suddenly at night due to difficulty breathing.  -A need to sleep sitting up or on more pillows than usual.  -Fast or irregular heart beats, palpitations, or a "racing heart" that persists and makes you feel dizzy or lightheaded.    Or if you:  -Cough up frothy or pink sputum.  -Feel like you may faint or pass out.    To manage your heart failure, please follow the following recommendations:    Fluid restriction:  You should follow a daily fluid restriction not to exceed 2 Liters daily (equivalent to 64 fluid ounces). This includes all things liquid at room temperature such as: all beverages, soup, ice cream, popsicles, jello and ice (you will need to guess how much the ice would be when melted and add toward your daily total). Keep track of your fluid intake throughout the day and measure everything.     Daily weights:  Weigh yourself every day when you wake up, right after using the bathroom. Keep a record of your daily weights and call our office if you gain more than 2-3 pounds in 1 day or 5 pounds in less than a week.     Low  sodium diet:  Sodium can lead to fluid retention and patients with heart failure should follow the low sodium diet, do not exceed 2000 mg sodium daily. Do not cook with salt or add it at the table. This includes table salt, sea salt, Kosher salt, garlic salt, seasoned salt or other seasoning's with salt (read the nutrition label). Avoid eating out at restaurants and high sodium foods such as processed meats, pre-packaged meals, pickles, soy sauce, chips, canned soups and bacon. Ask your healthcare provider for more information.    Salt Substitutes   When you eat sodium, your body dilutes it with water. This causes water retention, high blood pressure and a tough job for your heart! Limiting salt will give your heart a well-deserved break. but you don't have to sacrifice taste! Salt substitutes enhance the flavor of food without adding sodium. Replace your salt shaker with herbs & spices to support the health of your heart.    Potassium-Salt versus Sodium Salt-Substitutes  Some salt substitutes use Potassium Chloride instead of Sodium Chloride. For some, extra potassium is a bad idea because too much may cause heart rhythm problems. Never use a salt substitute with potassium chloride without first checking with your health care provider.     Contain Potassium:  - Nu-Salt by Sweet & Low   -  NoSalt  - Morton Salt Substitute  - AlsoSalt    No Potassium or Sodium:  - Mrs. Dash (in 10+ flavors)  - McCormick's No Added Salt (in 4 flavors)  - Emeril's No Salt (only 1 flavor: Essence [place])  - Sport and exercise psychologist by [employer] (in 7 flavors)  Herbs & Spices  - lemon or lime juice  - flavored vinegar  - thyme   - basil   - oregano  - sage  - nutmeg   - black pepper  - cayenne pepper  - paprika   - ginger   - cumin  - onion powder  - mace   - celery seed  - garlic powder  - fresh garlic  - curry powder  - mustard seed  - parsley flakes  - cinnamon  - bay leaf     Other Salt-Free Ideas  Season or marinate  meat, poultry, and fish ahead of time with onion, garlic, and your favorite herbs before cooking to bring out the flavor.    Activity:  We encourage you to engage in regular physical activity, such as daily walking. Start with short distances and walk at a comfortable pace. You should be able to "walk and talk". Gradually increase the distance and time that you walk. Exercise will help you feel better and can relieve the symptom of "heavy, tired legs". Stop exercise if you have chest pain, dizziness, or get so short of breath that you can not talk.   You should not lift/push/or pull heavy objects (anything heavy enough to make you strain).     Medications:  Take all your medications as prescribed by your health care provider and bring all your medication bottles to every appointment. It is important that you know what you are taking, when, and for what reason.    Medications you should not take:  Nonsteroidal anti-inflammatory drugs (NSAIDS): such as ibuprofen, Advil, Motrin, naprosyn, Naproxen, may cause fluid retention. You should not take these medications.    Call your healthcare provider if you are not feeling well or have new problems. We will call you back within 24 hours (Monday-Friday). In case of an emergency, call 911 or go to the nearest emergency department.      Thank you for choosing the Advanced Heart Failure Center at [facility] with [facility] for your care. It is important to keep all your medical appointments, even if you feel well. To reschedule or cancel an appointment with our clinic, call [phone]. If you need to leave a message on the nurse line for the heart failure clinic, please call [phone] or [phone].      Please start toprol XL 25 mg at bedtime to help reduce your blood pressure.  I have refilled your metformin.  You will need to have this medication filled in the future by your primary care provider.      Your device check is scheduled for Monday,  September 16th at 8:40 am.  They are arranging a cardiology new patient appointment and will update you with the date and time.

## 2017-10-08 NOTE — Progress Notes (Signed)
I had the pleasure of seeing [name] today at [facility] for an advanced heart failure initial appointment.  He moved to this area about a year ago and has not established with cardiology or primary care.  His primary care provider from [place] has been working with him to help with medication refills.     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  He was recently hospitalized due to heart failure symptoms.  He was seen in the observation unit and had an echocardiogram that showed a recovered EF of 50%.  He was noted to have an elevated hemoglobin A1c of 10.7.  He was started on insulin and continued on metformin.  He notes that short acting metformin causes a lot of GI upset he is able to tolerate it.     He has not taken any diuretics for a number of months but recently had to take 2 doses of Lasix due to lower extremity edema.  He has noticed lately that he is having difficulty with his endurance when he is exercising.  He was regularly going to the gym 4 to 6 days/week and was jogging but now he feels when he is jogging he is not able to go as far or run as hard.  He denies any symptoms with his walking.    Samuel Koch is a 47 y.o. male with nonischemic cardiomyopathy,  Recovered EF 50%, NYHA Class 2, ACC/AHA Stage C    He has had hospital admissions or emergency room visits. He denies any shortness of breath, lightheadedness, palpitations, orthostasis, syncope or near-syncope, orthopnea or PND. He has had no chest pain or pressure.  His appetite has been good and he denies any early satiety, nausea, vomiting or diarrhea.  He is able to climb 1 flights of stairs and is not exercising regularly.     Review of Systems   Constitution: Negative for activity change, appetite change, fatigue, fever, and unexpected weight change  HENT: Negative for trouble swallowing, and voice change  Eyes: Negative for any visual disturbances  Respiratory: Negative for apnea and/or  awakening with sob, chest tightness, and sob  Cardiovascular: Negative for chest pain/discomfort, leg or ankle swelling, difficulty breathing lying down, and palpitations  Gastrointestinal: Negative for abdominal pain, blood in stool, nausea, and vomiting  Endocrine: Negative for cold intolerance, heat intolerance, and polydipsia  GU: Negative for flank pain, frequent urination, and hematuria  Musculoskeletal: Negative for gait problems, joint swelling, neck pain, and pain or heaviness in legs  Skin: Negative for rash, wound, and ulcers on legs/feet  Neurological: Negative for dizziness, headaches, and syncope  Hematologic: Negative for bruises  Psychiatric: Negative for anxiety, and sleep disturbance    Comprehensive review of systems performed by me. Unless otherwise noted, all systems are negative.    10/06/17  136  Potassium 4.2  Glucose 304  BUN 25  Creatinine 1.17  ALT 19  AST 13  Hemoglobin 14.4  Hematocrit 41.6  Opponent negative  BNP 43.5  Hemoglobin A1c 10.7            Most recent laboratory testing reveals:   LABORATORY DATA:  Lab Results   Component Value Date    BUN 26 (H) 10/07/2017    NA 137 10/07/2017    K 4.0 10/07/2017    CL 104 10/07/2017    CO2 21.7 10/07/2017     Lab Results   Component Value Date    WBC 6.4  10/07/2017    HGB 14.0 10/07/2017    HCT 40.4 10/07/2017    MCV 90 10/07/2017    PLT 225 10/07/2017       Medical Problems:  Patient Active Problem List    Diagnosis Date Noted   . Obesity, morbid, BMI 40.0-49.9 10/07/2017   . DOE (dyspnea on exertion) 10/06/2017   . Hyperglycemia 03/29/2017   . Essential hypertension 03/29/2017   . Combined systolic and diastolic congestive heart failure 03/29/2017   . Dehydration, moderate 03/29/2017   . Anemia, unspecified type 03/29/2017   . Renal insufficiency 03/29/2017   . Near syncope 03/28/2017     History from Cardiology at [facility]  1. 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   - LHC (6/17): 30%  mid RCA, no other significant disease.   - Echo (6/17): EF 30-35%.   - Holter (7/17): 19% PVCs.   - Echo (3/18): EF 50-55%, moderate LVH, grade II diastolic dysfunction.   - CPX (3/18): submaximal, RER 0.99, VO2 20.8 (89% predicted), VE/VCO2 29 => no significant HF limitation.   - Echo (9/18): EF 50-55%  - echo 10/2017 ef 50%    2. Morbid obesity    3. Type II diabetes, uncontrolled    4. Hypertriglyceridemia    5. NSVT/PVCs:   - 48 hour holter (7/17) with 19% PVCs.  - PVC ablation in 8/17 but PVCs recurred.   - Holter (11/17) with rare PVCs, < 1%.   - Holter (9/18) with rare PVCs only    6. 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     7. Sleep study (1/18): Mild OSA - no cpap recommended  .   8. ABIs (3/18): Normal    9. Leg weakness with atorvastatin.                 Past Medical History:   Diagnosis Date   . Congestive heart failure    . Diabetes mellitus    . Hypertension      Past Surgical History:   Procedure Laterality Date   . APPENDECTOMY     . CARDIAC PACEMAKER PLACEMENT       Family History   Problem Relation Age of Onset   . Heart disease Father    . Hypertension Father    . Heart disease Sister    . Heart attack Sister    . Heart disease Brother    . Diabetes Brother    . Diabetes Paternal Grandmother      Social History     Social History   . Marital status: Married     Spouse name: N/A   . Number of children: N/A   . Years of education: N/A     Occupational History   . Not on file.     Social History Main Topics   . Smoking status: Never Smoker   . Smokeless tobacco: Never Used   . Alcohol use No   . Drug use: No   . Sexual activity: Not on file      Comment: Deferred     Other Topics Concern   . Not on file     Social History Narrative   . 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  Their youngest daughter is Diamond Nickel -he is 29 years old and was adopted by them 2 years ago.        Current Outpatient Prescriptions   Medication Sig Dispense Refill   . insulin glargine 100 UNIT/ML injection pen Inject 25 Units into the skin nightly 1 pen 10   . insulin lispro (HUMALOG) 100 UNIT/ML injection pen Inject 8 Units into the skin 3 (three) times daily before meals 1 pen 10   . Insulin Pen Needle 30G X 5 MM Misc For use with Insulin pen; may substitute pen needle available at pharmacy and accepted by patient's insurance. 100 each 2   . furosemide (LASIX) 40 MG tablet Take 40 mg by mouth as needed     . metFORMIN (GLUCOPHAGE) 1000 MG tablet Take 1 tablet (1,000 mg total) by mouth every morning with breakfast. 90 tablet 0     No current facility-administered medications for this visit.      No Known Allergies    Vitals:  BP (!) 146/92   Pulse 84   Resp 18   Wt 142.4 kg (314 lb)   SpO2 97%   BMI 40.32 kg/m . Estimated body mass index is 40.32 kg/m as calculated from the following:    Height as of 10/06/17: 1.88 m (6\' 2" ).    Weight as of this encounter: 142.4 kg (314 lb).         Physical Exam:   General: well-developed, alert, NAD  HEENT:  Anicteric sclera,no jvd  Lungs: CTA B/L, no rhonchi, wheezes or crackles with normal rate and effort  Cardiovascular: s1s2, normal rate, regular rhythm, no m/r/g, ppm well healed  Abdominal: soft, non tender   Extremities: warm to touch, no edema,.  Neuromuscular exam: grossly non-focal, though not formally tested.    Assessment and Plan:  1. Chronic combined systolic and diastolic congestive heart failure     2. Obesity, morbid, BMI 40.0-49.9     3. Renal insufficiency     4. Paroxysmal atrial fibrillation         Cardiomyopathy: My impression is that, Mr. Miley has a stage c, nonischemic  cardiomyopathy and currently reports NYHA Class II level of symptoms.    he  is well compensated and well perfused on exam. he does not have evidence of end-organ hypoperfusion based on labs and clinical exam.     I do feel that he has a poor short term cardiac prognosis and do feel that he warrants consideration for advanced heart failure therapies including mechanical circulatory support or heart transplantation.   We will be monitoring closely for changes in heart failure symptoms, electrolyte abnormalities and arrhythmias.    In regards to OMM:    Beta-blocker: no -start Toprol XL 25 mg at bedtime  ACE-I/ARB/ARNI:no - consider starting at follow-up visit. Ef has recovered to near normal  Aldosterone antagonist:no - consider starting at follow up visit. Ef has recovered  Hydralazine/nitrate: no -   Digoxin: no -      Med Changes: Started him on Toprol XL 25 mg.  He has a pacemaker in place.  His blood pressure is moderately elevated in clinic today.  I have made him aware that his goal blood pressure with his diabetes and  heart failure is less than 130/80.    Devices - CRT-D: he  is not a candidate for cardiac resynchronization therapy.   Diuretics: Continue Lasix as needed    He and his wife received face-to-face education on heart failure signs and symptoms, dietary recommendations, and fluid restriction.  He is currently not on significant medications in regards to his heart failure.  His EF is recovered.  His blood pressure is moderately elevated.  Reasonable to begin low-dose hypertensive medications to improve his blood pressure.  Due to his recovered EF he does not qualify for [facility] any longer.  Could consider restarting low-dose ACE inhibitor for high blood pressure management.      I have arranged for patient to be seen at [facility] cardiology for a device check and a new patient appointment with a cardiologist.  He has a history of PVCs s/p ablation, sinus node dysfunction with permanent pacemaker,  nonischemic cardiomyopathy with recovered EF, HTN, and diabetes poorly controlled.    Patient has a history of minimal coronary artery disease but likely should be on statin therapy.  This needs to be addressed at a future appointment likely with cardiology.    We have also provided him names of local primary care providers to see if they accept his insurance.    We will plan to see him back in about 30 days when he returns from [place].  In the meantime I have arranged a device check on September 16 at 840.  He will receive a new patient appointment at [facility] cardiology in the near future.    Counseling:  -I have encouraged pt to continue to follow a <2gm/day Na restriction and limit fluid intake to < 2L/day.  -I have requested pt perform daily wts and contact our office for increase of >2-3lbs in 1 day or 5lbs in 1 week.   -I have encouraged pt to engage in aerobic activity as tolerated and avoid heavy weight lifting.   -Smoking/Alcohol: I have advised pt to abstain from any tobacco use and excessive alcohol intake.    -Weight: Obesity is a significant risk for both short and long-term morbidity and mortality for AHF patients.  I have stressed the importance of weight control and dietary management.         No orders of the defined types were placed in this encounter.      Our office is available to answer any questions you or the patient may have. We appreciate the opportunity to participate in the care of your patients.          Deatra James. Georg Ruddle, FNP-C  Clinical Coordinator for Advance Heart Failure and Cardiomyopathy Center  [facility]  [facility]  [address]  [place], [place] [address]  [phone]      Note: This chart was generated by the Epic EMR system/speech recognition and may contain inherent errors or omissions not intended by the user. Grammatical errors, random word insertions, deletions, pronoun errors and incomplete sentences are occasional  consequences of this technology due to software limitations. Not all errors are caught or corrected. If there are questions or concerns about the content of this note or information contained within the body of this dictation they should be addressed directly with the author for clarification.

## 2017-10-09 LAB — ECG 12-LEAD
P-R Interval: 136 ms
Patient Age: 47 years
Patient Age: 47 years
Q-T Interval(Corrected): 411 ms
Q-T Interval(Corrected): 416 ms
Q-T Interval: 334 ms
Q-T Interval: 388 ms
QRS Axis: 51 deg
QRS Axis: 6 deg
QRS Duration: 84 ms
QRS Duration: 92 ms
T Axis: 32 years
T Axis: 59 years
Ventricular Rate: 69 //min
Ventricular Rate: 91 //min

## 2017-10-10 ENCOUNTER — Telehealth: Payer: Self-pay

## 2017-10-10 NOTE — Telephone Encounter (Signed)
CALLED PT TO OFFER HIM AN APPT WITH DR [name] ON 9/16 @ 9:30

## 2017-10-15 ENCOUNTER — Telehealth: Payer: Self-pay

## 2017-10-15 NOTE — Telephone Encounter (Signed)
No current PCP at this time. Previous cardiology records are located in Care Everywhere. Updates requested.

## 2017-10-17 NOTE — Progress Notes (Signed)
Cardiology New Consult    Patient Name: Samuel Koch   Date of Birth: 21-Aug-1970    Provider: Abel Presto, DO     Patient Care Team:  Patsy Lager, MD as PCP - General  Abel Presto, DO as Consulting Physician (Cardiology)  Elizbeth Squires, NP as Nurse Practitioner (Family Nurse Practitioner)    Chief Complaint: Cardiomyopathy      History of Present Illness     Samuel Koch was kindly referred by Inez Catalina, NP, for the chief complaint of Cardiomyopathy      Samuel Koch is a 47 y.o. male being seen today for chronic combined systolic and diastolic CHF, nonischemic cardio myopathy, bradycardia and PVCs.    He has a history of combined systolic and diastolic CHF, NICM, bradycardia, and PVCs. He is s/p PVC ablation 2017 and wore a holter monitor in 2018 that recorded occasional PVCs. The patient has sinus node dysfunction and is s/p medtronic dual chamber pacemaker implantation by Dr. Elberta Fortis.    His heart catheterization from 2017 demonstrated a 30% mid RCA lesion.    Overall he states that he is doing well. He states that he felt better after having an ablation and pacemaker implanted. His echo from 10/2017 demonstrated an EF of was before these procedures and medical therapy. He denies episodes of syncope and chest pain.     The patient went to the ED on September 2nd after dyspnea upon exertion, fatigue, and general weakness.     His device was checked in-office and has normal device function. There were a few episodes of nonsustained tachycardia recorded on his device.    He states he had a sleep study in the past, but was told he did not have OSA.      His father passed away due to CHF and his sister passed away at 58 due to cardiac arrest. He reports that his brother has cardiac issues as well.     He moved to [place] October of last year from [place]. 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 Most of his mission trips take place in [place].  Past Medical History     Hx 10/15/17    1.  Chronic combined systolic and diastolic CHF: Nonischemic cardiomyopathy, possibly due to PVCs versus prior myocarditis. No ETOH/drugs. Has strong family history of CAD but not of cardiomyopathy. NYHA II in 2018  - LHC (6/17): 30% mid RCA, no other significant disease.   - Echo (6/17): EF 30-35%.   - Holter (7/17): 19% PVCs.   - s/p 2017  - Echo (3/18): EF 50-55%, moderate LVH, grade II diastolic dysfunction.   - CPX (3/18): submaximal, RER 0.99, VO2 20.8 (89% predicted), VE/VCO2 29 => no significant HF limitation.   - Echo (9/18): EF 50-55%  - Echo 10/07/17- LV cavity size is increased. The LVEF is est. at 50%. Mild hypokinesis of the distal septum and apex which may be due to paced rhythm. Pacer wire present in RV.     2. Morbid obesity  3. Type II diabetes  4. Hypertriglyceridemia  5. NSVT/PVCs:   - 48 hour holter (7/17) with 19% PVCs.  - PVC ablation in 8/17 but PVCs recurred.   - Holter (11/17) with rare PVCs, < 1%.   - Holter (9/18) with rare PVCs only    6. 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   7. 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   8.  ABIs (3/18): Normal  9. Leg weakness with atorvastatin.     10/07/17- Na 137, K 4, GLU 312, Creat 1.12, BUN 26, GFR 77, WBC 6.4, RBC 4.49, HGB 14, HCT 40.4, PLT 225  10/06/17- A1c 10.7    Past Surgical History     Past Surgical History:   Procedure Laterality Date   . APPENDECTOMY     . CARDIAC PACEMAKER PLACEMENT         Social History     Social History   Substance Use Topics   . Smoking status: Never Smoker   . Smokeless tobacco: Never Used   . Alcohol use No       Family History     Family History   Problem Relation Age of Onset   . Heart disease Father    . Hypertension Father    . Heart disease Sister    . Heart attack Sister    . Heart disease Brother    . Diabetes Brother    . Diabetes Paternal Grandmother        Review of Systems     Constitution: Negative for activity change, appetite change, fatigue, fever, and  unexpected weight change  HENT: Negative for trouble swallowing, and voice change  Eyes: Negative for any visual disturbances  Respiratory: Negative for apnea and/or awakening with sob, chest tightness, and sob  Cardiovascular: Negative for chest pain/discomfort, leg or ankle swelling, difficulty breathing lying down, and palpitations    Comprehensive review of systems performed by me. Unless otherwise noted, all systems are negative.    Allergies     has No Known Allergies.    Medications       Current Outpatient Prescriptions:   .  furosemide (LASIX) 40 MG tablet, Take 40 mg by mouth as needed, Disp: , Rfl:   .  insulin glargine 100 UNIT/ML injection pen, Inject 25 Units into the skin nightly, Disp: 1 pen, Rfl: 10  .  insulin lispro (HUMALOG) 100 UNIT/ML injection pen, Inject 8 Units into the skin 3 (three) times daily before meals, Disp: 1 pen, Rfl: 10  .  Insulin Pen Needle 30G X 5 MM Misc, For use with Insulin pen; may substitute pen needle available at pharmacy and accepted by patient's insurance., Disp: 100 each, Rfl: 2  .  metFORMIN (GLUCOPHAGE) 500 MG tablet, Take 2 tablets (1,000 mg total) by mouth every morning with breakfast, Disp: 60 tablet, Rfl: 11  .  metoprolol succinate XL (TOPROL-XL) 25 MG 24 hr tablet, Take 1 tablet (25 mg total) by mouth nightly, Disp: 30 tablet, Rfl: 11    Physical Exam     Visit Vitals  BP (!) 132/92 (BP Site: Left arm, Patient Position: Sitting)   Pulse 66   Ht 1.88 m (6\' 2" )   Wt 145 kg (319 lb 9.6 oz)   BMI 41.03 kg/m     Vitals:    10/20/17 0926   BP: (!) 132/92   BP Site: Left arm   Patient Position: Sitting   Pulse: 66   Weight: 145 kg (319 lb 9.6 oz)   Height: 1.88 m (6\' 2" )     Wt Readings from Last 3 Encounters:   10/20/17 145 kg (319 lb 9.6 oz)   10/08/17 142.4 kg (314 lb)   10/06/17 144 kg (317 lb 7.4 oz)        Constitutional -  Well appearing, and in no distress  Respiratory - Clear to auscultation bilaterally, normal respiratory effort  Cardiovascular system -     Regular rate and rhythm    Normal S1, S2    No murmurs, rubs, gallops   Jugular venous pulse is normal        Carotid upstroke normal, no carotid bruits auscultated   2+ pulses in the posterior tibial / dorsalis pedis bilaterally    Neurological - Alert, oriented, no focal neurological deficits  Extremities -  No clubbing or cyanosis. No peripheral edema    Labs     Lab Results   Component Value Date/Time    WBC 6.4 10/07/2017 03:15 AM    RBC 4.49 10/07/2017 03:15 AM    HGB 14.0 10/07/2017 03:15 AM    HCT 40.4 10/07/2017 03:15 AM    PLT 225 10/07/2017 03:15 AM       Lab Results   Component Value Date/Time    NA 137 10/07/2017 03:15 AM    K 4.0 10/07/2017 03:15 AM    CL 104 10/07/2017 03:15 AM    CO2 21.7 10/07/2017 03:15 AM    GLU 312 (H) 10/07/2017 03:15 AM    BUN 26 (H) 10/07/2017 03:15 AM    CREAT 1.12 10/07/2017 03:15 AM    PROT 7.2 10/06/2017 08:18 PM    ALKPHOS 89 10/06/2017 08:18 PM    AST 13 10/06/2017 08:18 PM    ALT 19 10/06/2017 08:18 PM       No results found for: CHOL, TRIG, HDL, LDL    Lab Results   Component Value Date/Time    HGBA1CPERCNT 10.7 10/06/2017 10:37 PM       Cardiogenics:     EKG: NSR, normal QRS, left atrial enlargment.      Impression:        1. Chronic combined systolic and diastolic HF (heart failure)    2. NICM (nonischemic cardiomyopathy)      Plan:      1. Chronic combined systolic and diastolic congestive heart failure. NYHA class II symptoms. Currently diastolic HF. Unclear etiology of HF based on investigations in 2017.     2. H/o of PVCs. S/p ablation in 2018. Certainly decrease frequency of PVCs at this point.     3. Hypertension. Still not well controlled at guideline levels. Will start him on lisinopril 10 mg qd in addition to the metoprolol that he currently takes.    4. Diabetes Mellitus. Last A1c was 10.7. We discussed lifestyle changes to improve overall wellbeing.    5. I will see him in 3 months.      Thank you Inez Catalina, NP, for referring this interesting patient.      Respectfully,   Abel Presto, DO    This note was scribed by Charlestine Massed on behalf of Abel Presto, DO    Electronically signed by:   Abel Presto, DO  10/20/2017

## 2017-10-20 ENCOUNTER — Encounter: Payer: Self-pay | Admitting: Internal Medicine

## 2017-10-20 ENCOUNTER — Ambulatory Visit: Payer: 59

## 2017-10-20 ENCOUNTER — Ambulatory Visit: Payer: 59 | Admitting: Internal Medicine

## 2017-10-20 ENCOUNTER — Ambulatory Visit (INDEPENDENT_AMBULATORY_CARE_PROVIDER_SITE_OTHER): Payer: Self-pay | Admitting: *Deleted

## 2017-10-20 VITALS — BP 132/92 | HR 66 | Ht 74.0 in | Wt 319.6 lb

## 2017-10-20 DIAGNOSIS — I428 Other cardiomyopathies: Secondary | ICD-10-CM

## 2017-10-20 DIAGNOSIS — I495 Sick sinus syndrome: Secondary | ICD-10-CM

## 2017-10-20 DIAGNOSIS — I5042 Chronic combined systolic (congestive) and diastolic (congestive) heart failure: Secondary | ICD-10-CM

## 2017-10-20 MED ORDER — LISINOPRIL 10 MG PO TABS
10.00 mg | ORAL_TABLET | Freq: Every day | ORAL | 1 refills | Status: DC
Start: 2017-10-20 — End: 2018-03-25

## 2017-10-21 NOTE — Progress Notes (Signed)
Remote pacemaker transmission.   

## 2017-11-12 LAB — CUP PACEART REMOTE DEVICE CHECK
Battery Remaining Longevity: 99 mo
Battery Voltage: 3.02 V
Brady Statistic AS VS Percent: 55.66 %
Brady Statistic RA Percent Paced: 44.3 %
Brady Statistic RV Percent Paced: 0.04 %
Date Time Interrogation Session: 20190916131938
Implantable Lead Implant Date: 20170816
Implantable Lead Location: 753860
Implantable Lead Model: 5076
Implantable Pulse Generator Implant Date: 20170816
Lead Channel Impedance Value: 532 Ohm
Lead Channel Pacing Threshold Amplitude: 0.75 V
Lead Channel Pacing Threshold Pulse Width: 0.4 ms
Lead Channel Sensing Intrinsic Amplitude: 3.75 mV
Lead Channel Sensing Intrinsic Amplitude: 3.75 mV
Lead Channel Setting Pacing Amplitude: 1.5 V
Lead Channel Setting Pacing Pulse Width: 0.4 ms
Lead Channel Setting Sensing Sensitivity: 0.9 mV
MDC IDC LEAD IMPLANT DT: 20170816
MDC IDC LEAD LOCATION: 753859
MDC IDC MSMT LEADCHNL RA IMPEDANCE VALUE: 361 Ohm
MDC IDC MSMT LEADCHNL RA IMPEDANCE VALUE: 494 Ohm
MDC IDC MSMT LEADCHNL RV IMPEDANCE VALUE: 608 Ohm
MDC IDC MSMT LEADCHNL RV PACING THRESHOLD AMPLITUDE: 0.375 V
MDC IDC MSMT LEADCHNL RV PACING THRESHOLD PULSEWIDTH: 0.4 ms
MDC IDC MSMT LEADCHNL RV SENSING INTR AMPL: 14.375 mV
MDC IDC MSMT LEADCHNL RV SENSING INTR AMPL: 14.375 mV
MDC IDC SET LEADCHNL RV PACING AMPLITUDE: 2 V
MDC IDC STAT BRADY AP VP PERCENT: 0.03 %
MDC IDC STAT BRADY AP VS PERCENT: 44.29 %
MDC IDC STAT BRADY AS VP PERCENT: 0.02 %

## 2017-12-12 ENCOUNTER — Telehealth: Payer: Self-pay

## 2017-12-12 NOTE — Telephone Encounter (Signed)
Reminder phone call to patient of upcoming CHF appointment on 11/11. Left message

## 2017-12-15 ENCOUNTER — Telehealth: Payer: Self-pay

## 2017-12-15 ENCOUNTER — Ambulatory Visit: Payer: 59 | Admitting: Family Nurse Practitioner

## 2017-12-15 NOTE — Telephone Encounter (Signed)
Patients wife just called to cancel today's appointment. Stated he just got home from being out of the country and can not come in today. Didn't want to reschedule.

## 2018-03-03 ENCOUNTER — Telehealth: Payer: Self-pay

## 2018-03-03 NOTE — Telephone Encounter (Signed)
No pcp per patient 

## 2018-03-09 ENCOUNTER — Encounter: Payer: Self-pay | Admitting: Medical

## 2018-03-09 ENCOUNTER — Ambulatory Visit: Payer: 59 | Admitting: Medical

## 2018-03-09 VITALS — BP 128/80 | HR 72 | Ht 74.0 in | Wt 298.8 lb

## 2018-03-09 DIAGNOSIS — I5042 Chronic combined systolic (congestive) and diastolic (congestive) heart failure: Secondary | ICD-10-CM

## 2018-03-09 DIAGNOSIS — I495 Sick sinus syndrome: Secondary | ICD-10-CM

## 2018-03-09 DIAGNOSIS — Z950 Presence of cardiac pacemaker: Secondary | ICD-10-CM

## 2018-03-09 DIAGNOSIS — I493 Ventricular premature depolarization: Secondary | ICD-10-CM

## 2018-03-09 DIAGNOSIS — I428 Other cardiomyopathies: Secondary | ICD-10-CM

## 2018-03-09 NOTE — Progress Notes (Signed)
Cardiology Follow-Up      Patient Name: Samuel Koch   Date of Birth: 02-23-1970    Provider: Joneen Roach, PA     Patient Care Team:  Patsy Lager, MD as PCP - General  Abel Presto, DO as Consulting Physician (Cardiology)  Elizbeth Squires, NP as Nurse Practitioner (Family Nurse Practitioner)  Joneen Roach, PA as Physician Assistant (Physician Assistant)    Chief Complaint: Cardiomyopathy; Congestive Heart Failure; and PVC'S      History of Present Illness   [name] is a 48 y.o. male being seen today for follow up.     Patient is here today for follow-up.  Regularly followed by Dr. [name].  He has a history of nonischemic cardiomyopathy which has since improved.  Last echo in September showed EF at 50%.      He has a history of frequent PVCs and is status post PVC ablation in [place], [place].  He has had good control of his PVCs since his ablation.    He has a dual-chamber pacemaker that was implanted for sick sinus syndrome.  Last device check was in September.  He has a [employer] monitor but has never set up with remote our office.  We have made arrangements to do so today.    He states that he has been doing well.  He works as a IT sales professional and has a trip coming up to [place].  He notices some lower extremity edema after long flights, but otherwise no issues.  He denies shortness of breath.  He has had rare chest discomfort on and off.  Heart cath showed a 30% RCA lesion.  He occasionally has some palpitations.  Overall improved since his ablation.  No presyncope or syncope.    He states that he just finished a 40-day fast.  He has been off of all of his medications within that timeframe.  He has lost 20 pounds since last visit.    Review of Systems     Comprehensive review of systems performed.Other than noted in HPI , all systems are negative.    Past Medical History   Past Medical History 03/09/18 LLB-    1. Chronic combined systolic and diastolic CHF  2. Nonischemic cardiomyopathy  A.  LHC (6/17): 30% mid RCA, no other significant disease.   B. Echo (6/17): EF 30-35%.   C. Holter (7/17): 19% PVCs.   D. Echo (3/18): EF 50-55%, moderate LVH, grade II diastolic dysfunction.   E. Echo (9/18): EF 50-55%  F. Echo 10/07/17- LV cavity size is increased. The LVEF is est. at 50%. Mild hypokinesis of the distal septum and apex which may be due to paced rhythm. Pacer wire present in RV.     3. Type II diabetes  4. Hypertriglyceridemia  5. PVCs  A. 48 hour holter (7/17) with 19% PVCs.  B. PVC ablation in 8/17 but PVCs recurred.   C. Holter (11/17) with rare PVCs, < 1%.   D. Holter (9/18) with rare PVCs only    6. Bradycardia/ SSS  A. Dual chamber pacemaker placed (Medtronic, MRI compatible).     7. NSVT    Past Surgical History    has a past surgical history that includes Appendectomy and Cardiac pacemaker placement.  Family History   family history includes Diabetes in his brother and paternal grandmother; Heart attack in his sister; Heart disease in his brother, father, and sister; Hypertension in his father.  Social History  reports that he has quit smoking. He has quit using smokeless tobacco. He reports that he does not drink alcohol or use drugs.  Allergies   has No Known Allergies.  Medications     Current Outpatient Medications:     furosemide (LASIX) 40 MG tablet, Take 40 mg by mouth as needed, Disp: , Rfl:     insulin glargine 100 UNIT/ML injection pen, Inject 25 Units into the skin nightly, Disp: 1 pen, Rfl: 10    insulin lispro (HUMALOG) 100 UNIT/ML injection pen, Inject 8 Units into the skin 3 (three) times daily before meals, Disp: 1 pen, Rfl: 10    Insulin Pen Needle 30G X 5 MM Misc, For use with Insulin pen; may substitute pen needle available at pharmacy and accepted by patient's insurance., Disp: 100 each, Rfl: 2    lisinopril (PRINIVIL,ZESTRIL) 10 MG tablet, Take 1 tablet (10 mg total) by mouth daily, Disp: 90 tablet, Rfl: 1    metFORMIN (GLUCOPHAGE) 500 MG tablet, Take 2 tablets  (1,000 mg total) by mouth every morning with breakfast, Disp: 60 tablet, Rfl: 11    metoprolol succinate XL (TOPROL-XL) 25 MG 24 hr tablet, Take 1 tablet (25 mg total) by mouth nightly, Disp: 30 tablet, Rfl: 11  Physical Exam     Visit Vitals  BP 128/80 (BP Site: Left arm, Patient Position: Sitting)   Pulse 72   Ht 1.88 m (6\' 2" )   Wt 135.5 kg (298 lb 12.8 oz)   BMI 38.36 kg/m     Wt Readings from Last 3 Encounters:   03/09/18 135.5 kg (298 lb 12.8 oz)   10/20/17 145 kg (319 lb 9.6 oz)   10/08/17 142.4 kg (314 lb)      General appearance - alert, well appearing, and in no distress  Neck - supple, no significant adenopathy, carotids upstroke normal bilaterally, no bruits  Chest -clear to auscultation, no wheezes, rales or rhonchi, symmetric air entry  Heart - normal rate, regular rhythm, normal S1, S2, no murmurs, rubs, clicks or gallops  Extremities - no pedal edema noted  Neuro: alert   Skin: no rashes  Psych: normal affect    Labs     Lab Results   Component Value Date/Time    WBC 6.4 10/07/2017 03:15 AM    RBC 4.49 10/07/2017 03:15 AM    HGB 14.0 10/07/2017 03:15 AM    HCT 40.4 10/07/2017 03:15 AM    PLT 225 10/07/2017 03:15 AM     Lab Results   Component Value Date/Time    NA 137 10/07/2017 03:15 AM    K 4.0 10/07/2017 03:15 AM    CL 104 10/07/2017 03:15 AM    CO2 21.7 10/07/2017 03:15 AM    GLU 312 (H) 10/07/2017 03:15 AM    BUN 26 (H) 10/07/2017 03:15 AM    CREAT 1.12 10/07/2017 03:15 AM    PROT 7.2 10/06/2017 08:18 PM    ALKPHOS 89 10/06/2017 08:18 PM    AST 13 10/06/2017 08:18 PM    ALT 19 10/06/2017 08:18 PM     No results found for: CHOL, TRIG, HDL, LDL  Lab Results   Component Value Date/Time    HGBA1CPERCNT 10.7 10/06/2017 10:37 PM     Lab Results   Component Value Date/Time    BNP 43.5 10/06/2017 08:15 PM     EKG:   (personally reviewed by myself):  n/a    Impression and Recommendations:     1. NICM (nonischemic cardiomyopathy)  Last EF 50%.  Discussed that he should continue his metoprolol and  lisinopril.  He will resume.    2. PVC's (premature ventricular contractions)  Good control, s/p PVC ablation.     3. Chronic combined systolic and diastolic HF (heart failure)  EF has since normalized.  He is using Lasix as needed, which has been very rare.    4. SSS (sick sinus syndrome)  Pacer in place    5. Presence of permanent cardiac pacemaker  Normal function.  Initiate remote monitoring.     Return in about 4 months (around 07/08/2018).      Electronically signed by: Joneen Roach, PA 03/09/2018

## 2018-03-19 ENCOUNTER — Telehealth: Payer: Self-pay

## 2018-03-19 NOTE — Telephone Encounter (Signed)
Patient called stating he was told by his cardiologist that he needed to continue here in the [facility]. He stated he has had some swelling in the last couple weeks and has been fatigued. He stated he has taken lasix twice in the last week. He was encouraged to weigh daily monitoring for 2 lbs over night or 5 lbs in a week and take a dose of lasix. He is to monitor symptoms as well if he has increased symptoms he is to call the clinic for further instruction. He was scheduled for next weds morning as he stated Tuesday's do not work for him.

## 2018-03-24 ENCOUNTER — Telehealth: Payer: Self-pay

## 2018-03-24 NOTE — Telephone Encounter (Signed)
Reminder call for appt on 2/19 completed

## 2018-03-25 ENCOUNTER — Telehealth: Payer: Self-pay

## 2018-03-25 ENCOUNTER — Encounter: Payer: Self-pay | Admitting: Family Nurse Practitioner

## 2018-03-25 ENCOUNTER — Ambulatory Visit
Admission: RE | Admit: 2018-03-25 | Discharge: 2018-03-25 | Disposition: A | Discharge status: Home / Self Care | Payer: 59 | Source: Ambulatory Visit | Attending: Family Nurse Practitioner | Admitting: Family Nurse Practitioner

## 2018-03-25 VITALS — BP 157/98 | HR 62 | Resp 17 | Wt 308.1 lb

## 2018-03-25 DIAGNOSIS — R739 Hyperglycemia, unspecified: Secondary | ICD-10-CM | POA: Insufficient documentation

## 2018-03-25 DIAGNOSIS — N289 Disorder of kidney and ureter, unspecified: Secondary | ICD-10-CM

## 2018-03-25 DIAGNOSIS — R0609 Other forms of dyspnea: Secondary | ICD-10-CM | POA: Insufficient documentation

## 2018-03-25 DIAGNOSIS — I428 Other cardiomyopathies: Secondary | ICD-10-CM | POA: Insufficient documentation

## 2018-03-25 DIAGNOSIS — I480 Paroxysmal atrial fibrillation: Secondary | ICD-10-CM | POA: Insufficient documentation

## 2018-03-25 DIAGNOSIS — E860 Dehydration: Secondary | ICD-10-CM

## 2018-03-25 DIAGNOSIS — D649 Anemia, unspecified: Secondary | ICD-10-CM

## 2018-03-25 DIAGNOSIS — I5042 Chronic combined systolic (congestive) and diastolic (congestive) heart failure: Secondary | ICD-10-CM

## 2018-03-25 DIAGNOSIS — R5383 Other fatigue: Secondary | ICD-10-CM | POA: Insufficient documentation

## 2018-03-25 DIAGNOSIS — R55 Syncope and collapse: Secondary | ICD-10-CM

## 2018-03-25 DIAGNOSIS — I10 Essential (primary) hypertension: Secondary | ICD-10-CM

## 2018-03-25 LAB — I-STAT CHEM 8 CARTRIDGE
Anion Gap I-Stat: 16 (ref 7.0–16.0)
BUN I-Stat: 23 mg/dL — ABNORMAL HIGH (ref 7–22)
Calcium Ionized I-Stat: 5.1 mg/dL (ref 4.35–5.10)
Chloride I-Stat: 102 mMol/L (ref 98–110)
Creatinine I-Stat: 0.8 mg/dL (ref 0.80–1.30)
EGFR: 105 mL/min/{1.73_m2} (ref 60–150)
Glucose I-Stat: 204 mg/dL — ABNORMAL HIGH (ref 71–99)
Hematocrit I-Stat: 46 % (ref 39.0–52.5)
Hemoglobin I-Stat: 15.6 gm/dL (ref 13.0–17.5)
Potassium I-Stat: 4.5 mMol/L (ref 3.5–5.3)
Sodium I-Stat: 136 mMol/L (ref 136–147)
TCO2 I-Stat: 25 mMol/L (ref 24–29)

## 2018-03-25 MED ORDER — FUROSEMIDE 40 MG PO TABS
40.00 mg | ORAL_TABLET | ORAL | 1 refills | Status: DC | PRN
Start: 2018-03-25 — End: 2018-05-11

## 2018-03-25 MED ORDER — FUROSEMIDE 10 MG/ML IJ SOLN
80.00 mg | Freq: Once | INTRAMUSCULAR | Status: AC
Start: 2018-03-25 — End: 2018-03-25
  Administered 2018-03-25: 11:00:00 80 mg via INTRAVENOUS

## 2018-03-25 MED ORDER — FUROSEMIDE 10 MG/ML IJ SOLN
INTRAMUSCULAR | Status: DC
Start: 2018-03-25 — End: 2018-03-25
  Filled 2018-03-25: qty 8

## 2018-03-25 MED ORDER — LISINOPRIL 10 MG PO TABS
10.00 mg | ORAL_TABLET | Freq: Every day | ORAL | 1 refills | Status: DC
Start: 2018-03-25 — End: 2018-04-21

## 2018-03-25 NOTE — Patient Instructions (Addendum)
Heart failure causes many symptoms.   Some of them are more serious than others.   It is important to recognize when you should call 911 for emergency help and when you should call your doctor or nurse for urgent attention.    The following lists can help you decide what to do when you experience certain symptoms. You may want to print this page and keep it in a convenient spot.    Emergency Symptoms of Heart Failure  Call 911 for emergency help, if you have:    -Chest discomfort or pain that lasts more than 15 minutes that is not relieved with rest or nitroglycerin.  -Severe, persistent shortness of breath.  -Fainted or passed out.    Urgent Symptoms of Heart Failure  Call your doctor immediately, if you have any of the following symptoms:    -Increasing shortness of breath or a new shortness of breath while resting.  -Trouble sleeping due to difficulty breathing. For example, waking up suddenly at night due to difficulty breathing.  -A need to sleep sitting up or on more pillows than usual.  -Fast or irregular heart beats, palpitations, or a racing heart that persists and makes you feel dizzy or lightheaded.    Or if you:  -Cough up frothy or pink sputum.  -Feel like you may faint or pass out.    To manage your heart failure, please follow the following recommendations:    Fluid restriction:  You should follow a daily fluid restriction not to exceed 2 Liters daily (equivalent to 64 fluid ounces). This includes all things liquid at room temperature such as: all beverages, soup, ice cream, popsicles, jello and ice (you will need to guess how much the ice would be when melted and add toward your daily total). Keep track of your fluid intake throughout the day and measure everything.     Daily weights:  Weigh yourself every day when you wake up, right after using the bathroom. Keep a record of your daily weights and call our office if you gain more than 2-3 pounds in 1 day or 5 pounds in less than a week.     Low  sodium diet:  Sodium can lead to fluid retention and patients with heart failure should follow the low sodium diet, do not exceed 2000 mg sodium daily. Do not cook with salt or add it at the table. This includes table salt, sea salt, Kosher salt, garlic salt, seasoned salt or other seasoning's with salt (read the nutrition label). Avoid eating out at restaurants and high sodium foods such as processed meats, pre-packaged meals, pickles, soy sauce, chips, canned soups and bacon. Ask your healthcare provider for more information.    Salt Substitutes   When you eat sodium, your body dilutes it with water. This causes water retention, high blood pressure and a tough job for your heart! Limiting salt will give your heart a well-deserved break but you dont have to sacrifice taste! Salt substitutes enhance the flavor of food without adding sodium. Replace your salt shaker with herbs & spices to support the health of your heart.    Potassium-Salt versus Sodium Salt-Substitutes  Some salt substitutes use Potassium Chloride instead of Sodium Chloride. For some, extra potassium is a bad idea because too much may cause heart rhythm problems. Never use a salt substitute with potassium chloride without first checking with your health care provider.     Contain Potassium:  - Nu-Salt by [employer] Low   -  NoSalt  - [place] Salt Substitute  - AlsoSalt    No Potassium or Sodium:  - Mrs. Dash (in 10+ flavors)  - [place] No Added Salt (in 4 flavors)  - [place] No Salt (only 1 flavor: Essence [place])  - Sport and exercise psychologist by [employer] (in 7 flavors)  Herbs & Spices  - lemon or lime juice  - flavored vinegar  - thyme   - basil   - oregano  - sage  - nutmeg   - black pepper  - cayenne pepper  - paprika   - ginger   - cumin  - onion powder  - mace   - celery seed  - garlic powder  - fresh garlic  - curry powder  - mustard seed  - parsley flakes  - cinnamon  - bay leaf     Other Salt-Free Ideas  Season or marinate  meat, poultry, and fish ahead of time with onion, garlic, and your favorite herbs before cooking to bring out the flavor.    Activity:  We encourage you to engage in regular physical activity, such as daily walking. Start with short distances and walk at a comfortable pace. You should be able to "walk and talk". Gradually increase the distance and time that you walk. Exercise will help you feel better and can relieve the symptom of "heavy, tired legs". Stop exercise if you have chest pain, dizziness, or get so short of breath that you can not talk.   You should not lift/push/or pull heavy objects (anything heavy enough to make you strain).     Medications:  Take all your medications as prescribed by your health care provider and bring all your medication bottles to every appointment. It is important that you know what you are taking, when, and for what reason.    Medications you should not take:  Nonsteroidal anti-inflammatory drugs (NSAIDS): such as ibuprofen, Advil, Motrin, naprosyn, Naproxen, may cause fluid retention. You should not take these medications.    Call your healthcare provider if you are not feeling well or have new problems. We will call you back within 24 hours (Monday-Friday). In case of an emergency, call 911 or go to the nearest emergency department.      Thank you for choosing the [facility] at [facility] with [facility] for your care. It is important to keep all your medical appointments, even if you feel well. To reschedule or cancel an appointment with our clinic, call [phone]. If you need to leave a message on the nurse line for the heart failure clinic, please call [phone] or [phone].      Continue toprol xl 25 mg every day.    Start lisinopril 10 mg daily. You need repeat lab work in 2 weeks.    Check your blood sugars and weight daily. Record these results and bring it to your visits.     I am referring you to a diabetes educator to  discuss diet.     I am referring you to an endocrinologists for management of your diabetes. She will not have an appointment available until spring.     You have to schedule yourself to see a primary care provider in the next week.  You need to have regular office visits and improvement in your blood sugar readings and blood pressure readings.     I have ordered an echocardiogram.  You will receive a call with an appointment date and  time for this test.

## 2018-03-25 NOTE — Telephone Encounter (Signed)
Called and LVM. Per Inez Catalina, patient needs to schedule an echo. Please schedule. Thank you, CLB

## 2018-03-25 NOTE — Progress Notes (Signed)
I had the pleasure of seeing [name] today at [facility] for an advanced heart failure follow up visit.    His last office visit was in September.  He canceled his follow-up appointment due to traveling to [place] for work.  He has not been seen in our clinic since that time.  He was seen by [facility] cardiology on [place] [address].  His device was checked at that time.      Samuel Koch completed a 40-day fast during November and December.  He did not eat any solid foods and primarily drink water and coffee.  He did towards the end of his fast use broth and V8 juice for consumption.  He lost 35 to 40 pounds.  He states during his fast he completely stopped all of his medications.  He stopped his insulin and his blood pressure medications.  He was worried he would bottom out.    He has started to gain weight again.  He was not completing daily weights at home.  He has had a 10 pound weight gain by the scales here and at [facility] cardiology over 16 days.  He thinks that he gained the 10 pounds in the last week.  He has noticed increased fatigue increased shortness of breath with activity and increased hacking cough with foamy white sputum.  He feels the fatigue is limiting his ability to be able to function.  He is unable to exercise due to feeling poorly.  He still does not have an appointment with a primary care provider.  He has not arranged to have one yet.  His wife is here with him at the appointment today and she states that his fatigue is extreme.    At the office visit at [facility] cardiology he was recommended to go back on his Toprol 25 mg daily and lisinopril 10 mg daily.  He has only started his Toprol.  His blood pressure in clinic is moderately elevated.  Typically he is very symptom-free in relation to his heart failure and cardiomyopathy.  His last device check was September 2019.  That device check did show some SVT and NSVT.     Samuel Koch is a 48 y.o. male with  nonischemic cardiomyopathy,  EF 50%, NYHA Class 3, ACC/AHA Stage C    He has had hospital admissions or emergency room visits. He denies any lightheadedness, palpitations, orthostasis, syncope or near-syncope, or PND. He has had no chest pain or pressure.  His appetite has been poor and he denies any early satiety, nausea, vomiting or diarrhea.  He is able to climb 1 flights of stairs and is not exercising regularly.     Review of Systems   Constitution: Negative for appetite change,  fever, Positive for activity change and fatigue and unexpected weight change  HENT: Negative for trouble swallowing, and voice change  Eyes: Negative for any visual disturbances  Respiratory: Negative for apnea and/or awakening with sob, chest tightness, Positive for sob  Cardiovascular: Negative for chest pain/discomfort, and palpitations Positive dyspnea leg or ankle swelling, difficulty breathing lying down,   Gastrointestinal: Negative for abdominal pain, blood in stool, nausea, and vomiting Positive for bloating   Endocrine: Negative for cold intolerance, heat intolerance, and polydipsia  GU: Negative for hematuria  Musculoskeletal: Negative for gait problems, joint swelling, neck pain, and pain or heaviness in legs  Skin: Negative for rash, wound, and ulcers on legs/feet  Neurological: Negative for dizziness, headaches, and syncope  Hematologic:  Negative for bruises  Psychiatric: Negative for anxiety, and sleep disturbance    Comprehensive review of systems performed by me. Unless otherwise noted, all systems are negative.    I-STAT lab work  Sodium 136  Potassium 4.5  CO2 25  Glucose 204  BUN 23  Creatinine 0.8  Hemoglobin 15.6  Hematocrit 46    Most recent laboratory testing reveals:   LABORATORY DATA:  Lab Results   Component Value Date    BUN 26 (H) 10/07/2017    NA 137 10/07/2017    K 4.0 10/07/2017    CL 104 10/07/2017    CO2 21.7 10/07/2017     Lab Results   Component Value Date    WBC 6.4 10/07/2017    HGB 14.0 10/07/2017     HCT 40.4 10/07/2017    MCV 90 10/07/2017    PLT 225 10/07/2017       Medical Problems:  Patient Active Problem List    Diagnosis Date Noted    NICM (nonischemic cardiomyopathy) 10/20/2017    Chronic combined systolic and diastolic HF (heart failure) 10/20/2017    Obesity, morbid, BMI 40.0-49.9 10/07/2017    DOE (dyspnea on exertion) 10/06/2017    Hyperglycemia 03/29/2017    Essential hypertension 03/29/2017    Combined systolic and diastolic congestive heart failure 03/29/2017    Dehydration, moderate 03/29/2017    Anemia, unspecified type 03/29/2017    Renal insufficiency 03/29/2017    Near syncope 03/28/2017       History from Cardiology at [facility]  1. 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   - LHC (6/17): 30% mid RCA, no other significant disease.   - Echo (6/17): EF 30-35%.   - Holter (7/17): 19% PVCs.   - Echo (3/18): EF 50-55%, moderate LVH, grade II diastolic dysfunction.   - CPX (3/18): submaximal, RER 0.99, VO2 20.8 (89% predicted), VE/VCO2 29 => no significant HF limitation.   - Echo (9/18): EF 50-55%  - echo 10/17/2017 ef 50%    2. Morbid obesity    3. Type II diabetes, uncontrolled    4. Hypertriglyceridemia    5. NSVT/PVCs:   - 48 hour holter (7/17) with 19% PVCs.  - PVC ablation in 8/17 but PVCs recurred.   - Holter (11/17) with rare PVCs, < 1%.   - Holter (9/18) with rare PVCs only    6. 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     7. Sleep study (1/18): Mild OSA - no cpap recommended  .   8. ABIs (3/18): Normal    9. Leg weakness with atorvastatin.     Past Medical History:   Diagnosis Date    Cardiomyopathy     Congestive heart failure     Diabetes mellitus     Hypertension     PVC's (premature ventricular contractions)      Past Surgical History:   Procedure Laterality Date    APPENDECTOMY      CARDIAC PACEMAKER PLACEMENT       Family History   Problem Relation Age  of Onset    Heart disease Father     Hypertension Father     Heart disease Sister     Heart attack Sister     Heart disease Brother     Diabetes Brother     Diabetes Paternal Grandmother      Social History     Socioeconomic History  Marital status: Married     Spouse name: Not on file    Number of children: Not on file    Years of education: Not on file    Highest education level: Not on file   Occupational History    Not on file   Social Needs    Financial resource strain: Not on file    Food insecurity:     Worry: Not on file     Inability: Not on file    Transportation needs:     Medical: Not on file     Non-medical: Not on file   Tobacco Use    Smoking status: Former Smoker    Smokeless tobacco: Former Neurosurgeon   Substance and Sexual Activity    Alcohol use: No    Drug use: No    Sexual activity: Not on file     Comment: Deferred   Lifestyle    Physical activity:     Days per week: Not on file     Minutes per session: Not on file    Stress: Not on file   Relationships    Social connections:     Talks on phone: Not on file     Gets together: Not on file     Attends religious service: Not on file     Active member of club or organization: Not on file     Attends meetings of clubs or organizations: Not on file     Relationship status: Not on file    Intimate partner violence:     Fear of current or ex partner: Not on file     Emotionally abused: Not on file     Physically abused: Not on file     Forced sexual activity: Not on file   Other Topics Concern    Not on file   Social History Narrative    Not on file     Married     Non smoker - isolated smoking before age 55  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  Their youngest daughter is  Diamond Nickel -he is 60 years old and was adopted by them 2 years ago.      Current Outpatient Medications   Medication Sig Dispense Refill    furosemide (LASIX) 40 MG tablet Take 40 mg by mouth as needed      metoprolol succinate XL (TOPROL-XL) 25 MG 24 hr tablet Take 1 tablet (25 mg total) by mouth nightly 30 tablet 11    insulin glargine 100 UNIT/ML injection pen Inject 25 Units into the skin nightly 1 pen 10    insulin lispro (HUMALOG) 100 UNIT/ML injection pen Inject 8 Units into the skin 3 (three) times daily before meals 1 pen 10    Insulin Pen Needle 30G X 5 MM Misc For use with Insulin pen; may substitute pen needle available at pharmacy and accepted by patient's insurance. 100 each 2    lisinopril (PRINIVIL,ZESTRIL) 10 MG tablet Take 1 tablet (10 mg total) by mouth daily 90 tablet 1    metFORMIN (GLUCOPHAGE) 500 MG tablet Take 2 tablets (1,000 mg total) by mouth every morning with breakfast 60 tablet 11  No current facility-administered medications for this encounter.      No Known Allergies    Vitals:  BP (!) 157/98    Pulse 62    Resp 17    Wt 139.8 kg (308 lb 2 oz)    SpO2 98%    BMI 39.56 kg/m . Estimated body mass index is 39.56 kg/m as calculated from the following:    Height as of 03/09/18: 1.88 m (6\' 2" ).    Weight as of this encounter: 139.8 kg (308 lb 2 oz).  His weight is up 6 lbs office visit - up 10 lbs per recent weights?     Physical Exam:   General:overweight male, alert, NAD  HEENT:  Anicteric sclera, supple  Lungs: CTA B/L, no rhonchi, wheezes or crackles with normal rate and effort  Cardiovascular: s1s2, normal rate, regular rhythm, no m/r/g  Abdominal: distended  Extremities: warm to touch,1-2+ edema  Neuromuscular exam: grossly non-focal, though not formally tested.    Assessment and Plan:  1. Chronic combined systolic and diastolic congestive heart failure     2. Obesity, morbid, BMI 40.0-49.9     3. Renal insufficiency     4. Paroxysmal atrial fibrillation          Cardiomyopathy: My impression is that, Samuel Koch has a stage c, nonischemic cardiomyopathy recovered EF and currently reports NYHA Class III level of symptoms.    he  is not well compensated and well perfused on exam. he does not have evidence of end-organ hypoperfusion based on labs and clinical exam.     We will be monitoring closely for changes in heart failure symptoms, electrolyte abnormalities and arrhythmias.    In regards to OMM:    Beta-blocker: yes  ACE-I/ARB/ARNI:no -   Aldosterone antagonist:no -   Hydralazine/nitrate: no -   Digoxin: no -      Patient's blood pressure is moderately elevated in clinic today.  He has not restarted his lisinopril.  I have reviewed with him the need to take both hypertensive agents continually.      Due to his elevated blood pressure and uncontrolled blood sugar it could be contributing to his fatigue.  I have reviewed with the patient and his wife the need to find a primary care provider.  I have provided him a list of local family practices that they could contact and schedule an appointment with.  I have asked them to try to schedule an appointment with a primary care provider in the next 1 to 2 weeks.  Patient needs better management of his diabetes.  While he completed his fast I still think dietary education could be helpful to him in reference to his diabetes.  I will request diabetes education.    Due to his fatigue we will check a hemoglobin A1c and thyroid panel.  His blood count on his i-STAT today is normal.  I will obtain updated lab work to include a BMP in 2 weeks with the TSH and hemoglobin A1c.  Will refer to endocrinology for treatment of diabetes.  In the meantime he needs to see a primary care provider.    For his volume overload, I have provided him 80 mg IV Lasix in clinic today.  I have requested updated echocardiogram due to increasing symptoms.  I do think he is on optimal heart failure medications.  We will see him back in 1 month with repeat  blood pressure check and repeat lab work.  Patient was provided lab  slips for repeat labs in 2 weeks due to restarting lisinopril.  I have asked him to continue lasix daily for next 7 days or until weight declines.  He needs to complete daily weights.         Counseling:  -I have encouraged pt to continue to follow a <2gm/day Na restriction and limit fluid intake to < 2L/day.  -I have requested pt perform daily wts and contact our office for increase of >2-3lbs in 1 day or 5lbs in 1 week.   -I have encouraged pt to engage in aerobic activity as tolerated and avoid heavy weight lifting.   -Smoking/Alcohol: I have advised pt to abstain from any tobacco use and excessive alcohol intake.    -Weight: Obesity is a significant risk for both short and long-term morbidity and mortality for AHF patients.  I have stressed the importance of weight control and dietary management.         No orders of the defined types were placed in this encounter.      Our office is available to answer any questions you or the patient may have. We appreciate the opportunity to participate in the care of your patients.          Deatra James. Georg Ruddle, FNP-C  Clinical Coordinator for [facility]  [facility]  [facility]  [address]  [place], [place] [address]  [phone]       Note:  This chart was generated by the Epic EMR system/speech recognition and may contain inherent errors or omissions not intended by the user. Grammatical errors, random word insertions, deletions, pronoun errors and incomplete sentences are occasional consequences of this technology due to software limitations. Not all errors are caught or corrected. If there are questions or concerns about the content of this note or information contained within the body of this dictation they should be addressed directly with the author for clarification.

## 2018-03-26 ENCOUNTER — Observation Stay: Payer: 59

## 2018-03-26 ENCOUNTER — Observation Stay
Admission: EM | Admit: 2018-03-26 | Discharge: 2018-03-30 | Disposition: A | Discharge status: Home / Self Care | Payer: 59 | Attending: Emergency Medicine | Admitting: Emergency Medicine

## 2018-03-26 ENCOUNTER — Emergency Department: Payer: 59

## 2018-03-26 DIAGNOSIS — E785 Hyperlipidemia, unspecified: Secondary | ICD-10-CM | POA: Insufficient documentation

## 2018-03-26 DIAGNOSIS — I5042 Chronic combined systolic (congestive) and diastolic (congestive) heart failure: Secondary | ICD-10-CM

## 2018-03-26 DIAGNOSIS — Z794 Long term (current) use of insulin: Secondary | ICD-10-CM | POA: Insufficient documentation

## 2018-03-26 DIAGNOSIS — M542 Cervicalgia: Secondary | ICD-10-CM

## 2018-03-26 DIAGNOSIS — Z950 Presence of cardiac pacemaker: Secondary | ICD-10-CM | POA: Insufficient documentation

## 2018-03-26 DIAGNOSIS — Z79899 Other long term (current) drug therapy: Secondary | ICD-10-CM | POA: Insufficient documentation

## 2018-03-26 DIAGNOSIS — R072 Precordial pain: Secondary | ICD-10-CM

## 2018-03-26 DIAGNOSIS — I10 Essential (primary) hypertension: Secondary | ICD-10-CM

## 2018-03-26 DIAGNOSIS — R079 Chest pain, unspecified: Secondary | ICD-10-CM | POA: Diagnosis present

## 2018-03-26 DIAGNOSIS — I428 Other cardiomyopathies: Secondary | ICD-10-CM | POA: Insufficient documentation

## 2018-03-26 DIAGNOSIS — I110 Hypertensive heart disease with heart failure: Secondary | ICD-10-CM | POA: Insufficient documentation

## 2018-03-26 DIAGNOSIS — E119 Type 2 diabetes mellitus without complications: Secondary | ICD-10-CM | POA: Insufficient documentation

## 2018-03-26 DIAGNOSIS — R0789 Other chest pain: Principal | ICD-10-CM | POA: Insufficient documentation

## 2018-03-26 LAB — BASIC METABOLIC PANEL
Anion Gap: 13.9 mMol/L (ref 7.0–18.0)
BUN / Creatinine Ratio: 21.3 Ratio (ref 10.0–30.0)
BUN: 27 mg/dL — ABNORMAL HIGH (ref 7–22)
CO2: 26 mMol/L (ref 20–30)
Calcium: 9.3 mg/dL (ref 8.5–10.5)
Chloride: 101 mMol/L (ref 98–110)
Creatinine: 1.27 mg/dL (ref 0.80–1.30)
EGFR: 66 mL/min/{1.73_m2} (ref 60–150)
Glucose: 274 mg/dL — ABNORMAL HIGH (ref 71–99)
Osmolality Calculated: 287 mOsm/kg (ref 275–300)
Potassium: 4.9 mMol/L (ref 3.5–5.3)
Sodium: 136 mMol/L (ref 136–147)

## 2018-03-26 LAB — CBC AND DIFFERENTIAL
Basophils %: 0.4 % (ref 0.0–3.0)
Basophils Absolute: 0.1 10*3/uL (ref 0.0–0.3)
Eosinophils %: 0.6 % (ref 0.0–7.0)
Eosinophils Absolute: 0.1 10*3/uL (ref 0.0–0.8)
Hematocrit: 45.6 % (ref 39.0–52.5)
Hemoglobin: 15.2 gm/dL (ref 13.0–17.5)
Lymphocytes Absolute: 0.8 10*3/uL (ref 0.6–5.1)
Lymphocytes: 6 % — ABNORMAL LOW (ref 15.0–46.0)
MCH: 30 pg (ref 28–35)
MCHC: 33 gm/dL (ref 32–36)
MCV: 91 fL (ref 80–100)
MPV: 8.7 fL (ref 6.0–10.0)
Monocytes Absolute: 0.8 10*3/uL (ref 0.1–1.7)
Monocytes: 5.6 % (ref 3.0–15.0)
Neutrophils %: 87.5 % — ABNORMAL HIGH (ref 42.0–78.0)
Neutrophils Absolute: 12.5 10*3/uL — ABNORMAL HIGH (ref 1.7–8.6)
PLT CT: 264 10*3/uL (ref 130–440)
RBC: 5.01 10*6/uL (ref 4.00–5.70)
RDW: 11.4 % (ref 11.0–14.0)
WBC: 14.2 10*3/uL — ABNORMAL HIGH (ref 4.0–11.0)

## 2018-03-26 LAB — LIPID PANEL
Cholesterol: 201 mg/dL — ABNORMAL HIGH (ref 75–199)
Coronary Heart Disease Risk: 4.28
HDL: 47 mg/dL (ref 40–55)
LDL Calculated: 108 mg/dL
Triglycerides: 228 mg/dL — ABNORMAL HIGH (ref 10–150)
VLDL: 46 — ABNORMAL HIGH (ref 0–40)

## 2018-03-26 LAB — VH INFLUENZA A/B RAPID TEST
Influenza A: NEGATIVE
Influenza B: NEGATIVE

## 2018-03-26 LAB — B-TYPE NATRIURETIC PEPTIDE: B-Natriuretic Peptide: <10 pg/mL (ref 0.0–100.0)

## 2018-03-26 LAB — TROPONIN I
Troponin I: 0.02 ng/mL (ref 0.00–0.02)
Troponin I: 0.02 ng/mL (ref 0.00–0.02)

## 2018-03-26 LAB — D-DIMER, QUANTITATIVE: D-Dimer: 0.69 mg/L FEU — ABNORMAL HIGH (ref 0.19–0.52)

## 2018-03-26 LAB — HEMOGLOBIN A1C: Hgb A1C, %: 8.2 %

## 2018-03-26 MED ORDER — METOPROLOL SUCCINATE ER 25 MG PO TB24
25.00 mg | ORAL_TABLET | Freq: Every morning | ORAL | Status: DC
Start: 2018-03-27 — End: 2018-03-30
  Administered 2018-03-28 – 2018-03-30 (×3): 25 mg via ORAL
  Filled 2018-03-26 (×3): qty 1

## 2018-03-26 MED ORDER — VALLEY PROMETHAZINE 50 MG/0.4 ML TOPICAL GEL UD (RPKG)
TOPICAL | Status: AC
Start: 2018-03-26 — End: ?
  Filled 2018-03-26: qty 0.4

## 2018-03-26 MED ORDER — VALLEY PROMETHAZINE 50 MG/0.4 ML TOPICAL GEL UD (RPKG)
25.00 mg | Freq: Once | TOPICAL | Status: AC
Start: 2018-03-26 — End: 2018-03-26
  Administered 2018-03-26: 17:00:00 25 mg via TOPICAL

## 2018-03-26 MED ORDER — SODIUM CHLORIDE (PF) 0.9 % IJ SOLN
0.40 mg | INTRAMUSCULAR | Status: DC | PRN
Start: 2018-03-26 — End: 2018-03-30

## 2018-03-26 MED ORDER — NITROGLYCERIN 0.4 MG SL SUBL
0.40 mg | SUBLINGUAL_TABLET | Freq: Once | SUBLINGUAL | Status: AC
Start: 2018-03-26 — End: 2018-03-26
  Administered 2018-03-26: 19:00:00 0.4 mg via SUBLINGUAL

## 2018-03-26 MED ORDER — MORPHINE SULFATE 4 MG/ML IJ/IV SOLN (WRAP)
2.0000 mg | Status: DC | PRN
Start: 2018-03-26 — End: 2018-03-28

## 2018-03-26 MED ORDER — NITROGLYCERIN 0.4 MG SL SUBL
SUBLINGUAL_TABLET | SUBLINGUAL | Status: AC
Start: 2018-03-26 — End: ?
  Filled 2018-03-26: qty 25

## 2018-03-26 MED ORDER — FUROSEMIDE 40 MG PO TABS
40.0000 mg | ORAL_TABLET | ORAL | Status: DC | PRN
Start: 2018-03-27 — End: 2018-03-30

## 2018-03-26 MED ORDER — ONDANSETRON 4 MG PO TBDP
4.00 mg | ORAL_TABLET | Freq: Four times a day (QID) | ORAL | Status: DC | PRN
Start: 2018-03-26 — End: 2018-03-30

## 2018-03-26 MED ORDER — ACETAMINOPHEN 160 MG/5ML PO SOLN
650.00 mg | ORAL | Status: DC | PRN
Start: 2018-03-26 — End: 2018-03-30

## 2018-03-26 MED ORDER — MORPHINE SULFATE 4 MG/ML IJ/IV SOLN (WRAP)
Status: AC
Start: 2018-03-26 — End: ?
  Filled 2018-03-26: qty 1

## 2018-03-26 MED ORDER — PROCHLORPERAZINE EDISYLATE 10 MG/2ML IJ SOLN
INTRAMUSCULAR | Status: AC
Start: 2018-03-26 — End: ?
  Filled 2018-03-26: qty 2

## 2018-03-26 MED ORDER — ASPIRIN 81 MG PO CHEW
81.00 mg | CHEWABLE_TABLET | Freq: Every day | ORAL | Status: DC
Start: 2018-03-27 — End: 2018-03-30
  Administered 2018-03-28 – 2018-03-30 (×3): 81 mg via ORAL
  Filled 2018-03-26 (×3): qty 1

## 2018-03-26 MED ORDER — ACETAMINOPHEN 325 MG PO TABS
650.0000 mg | ORAL_TABLET | ORAL | Status: DC | PRN
Start: 2018-03-26 — End: 2018-03-30
  Administered 2018-03-26: 650 mg via ORAL
  Filled 2018-03-26: qty 2

## 2018-03-26 MED ORDER — MORPHINE SULFATE 4 MG/ML IJ/IV SOLN (WRAP)
2.0000 mg | Freq: Once | Status: AC
Start: 2018-03-26 — End: 2018-03-26
  Administered 2018-03-26: 19:00:00 2 mg via INTRAVENOUS

## 2018-03-26 MED ORDER — ONDANSETRON HCL 4 MG/2ML IJ SOLN
4.00 mg | Freq: Four times a day (QID) | INTRAMUSCULAR | Status: DC | PRN
Start: 2018-03-26 — End: 2018-03-30

## 2018-03-26 MED ORDER — NITROGLYCERIN 0.4 MG SL SUBL
0.40 mg | SUBLINGUAL_TABLET | SUBLINGUAL | Status: DC | PRN
Start: 2018-03-26 — End: 2018-03-30
  Administered 2018-03-28 (×3): 0.4 mg via SUBLINGUAL
  Filled 2018-03-26: qty 25

## 2018-03-26 MED ORDER — PROCHLORPERAZINE EDISYLATE 10 MG/2ML IJ SOLN
5.00 mg | Freq: Once | INTRAMUSCULAR | Status: AC
Start: 2018-03-26 — End: 2018-03-26
  Administered 2018-03-26: 19:00:00 5 mg via INTRAVENOUS

## 2018-03-26 MED ORDER — METFORMIN HCL 500 MG PO TABS
1000.0000 mg | ORAL_TABLET | Freq: Every morning | ORAL | Status: DC
Start: 2018-03-27 — End: 2018-03-30
  Filled 2018-03-26 (×3): qty 2

## 2018-03-26 MED ORDER — IOHEXOL 350 MG/ML IV SOLN
70.00 mL | Freq: Once | INTRAVENOUS | Status: AC | PRN
Start: 2018-03-26 — End: 2018-03-26
  Administered 2018-03-26: 70 mL via INTRAVENOUS

## 2018-03-26 MED ORDER — NITROGLYCERIN 0.4 MG SL SUBL
0.40 mg | SUBLINGUAL_TABLET | Freq: Once | SUBLINGUAL | Status: AC
Start: 2018-03-26 — End: 2018-03-26
  Administered 2018-03-26: 18:00:00 0.4 mg via SUBLINGUAL

## 2018-03-26 MED ORDER — LISINOPRIL 10 MG PO TABS
10.0000 mg | ORAL_TABLET | Freq: Every day | ORAL | Status: DC
Start: 2018-03-27 — End: 2018-03-30
  Administered 2018-03-28 – 2018-03-30 (×3): 10 mg via ORAL
  Filled 2018-03-26 (×3): qty 1

## 2018-03-26 MED ORDER — ENOXAPARIN SODIUM 40 MG/0.4ML SC SOLN
40.00 mg | Freq: Every evening | SUBCUTANEOUS | Status: DC
Start: 2018-03-26 — End: 2018-03-30
  Administered 2018-03-26 – 2018-03-29 (×4): 40 mg via SUBCUTANEOUS
  Filled 2018-03-26 (×4): qty 0.4

## 2018-03-26 MED ORDER — HYDROCODONE-ACETAMINOPHEN 5-325 MG PO TABS
1.0000 | ORAL_TABLET | ORAL | Status: DC | PRN
Start: 2018-03-26 — End: 2018-03-30

## 2018-03-26 MED ORDER — SODIUM CHLORIDE 0.9 % IV SOLN
INTRAVENOUS | Status: DC
Start: 2018-03-26 — End: 2018-03-27

## 2018-03-26 MED ORDER — NITROGLYCERIN 0.4 MG SL SUBL
0.40 mg | SUBLINGUAL_TABLET | Freq: Once | SUBLINGUAL | Status: AC
Start: 2018-03-26 — End: 2018-03-26
  Administered 2018-03-26: 17:00:00 0.4 mg via SUBLINGUAL

## 2018-03-26 MED ORDER — ACETAMINOPHEN 650 MG RE SUPP
650.00 mg | RECTAL | Status: DC | PRN
Start: 2018-03-26 — End: 2018-03-30

## 2018-03-26 NOTE — Telephone Encounter (Signed)
Pt's wife called in, in regards to missed call. She states that as of right now pt is doing worse than when he was seen at the HF clinic yesterday. He is having active CP and getting worse. Advised that we can worry about ECHO later, based on sxs pt should go to the ED and she is in agreement    Ahill PN

## 2018-03-26 NOTE — H&P (Signed)
[facility]  OBSERVATION UNIT  HISTORY AND PHYSICAL       Patient: Samuel Koch  Admission Date: 03/26/2018    DOB: 04/20/1970  Age: 48 y.o.    MRN: [id number]  Sex: male    PCP: Patsy Lager, MD  Attending: Myna Hidalgo, MD         HISTORY OF PRESENT ILLNESS     Samuel Koch is a 48 y.o. male who presented with chest pain for past week, worse today.  Patient has history of cardiomyopathy, status post PPM and CHF.  He was seen at the heart failure clinic yesterday by Jacques Earthly, NP.  He did a 40-day fast in November and December.  He admittedly stopped taking all of his medication during this time.   And was complaining yesterday that he had "gained 10 pounds".  He has been experiencing extreme fatigue, worse with minimal exertion.  He has noticed intermittent chest pain which became worse today just after 1 PM.  Described as substernal pain with radiation to left arm.  Some associated nausea.  Denies shortness of breath or diaphoresis.  He has had a productive cough.  Denies fever, chills,vomiting, diarrhea, body aches, trouble swallowing, or heartburn.  He did travel to [place] in November.    Workup in the ED includes CBC with WBC 14.  BUN/creatinine: 27/1.3.  Chest x-ray is negative.  ECG,  and initial Trop-I has been unremarkable.  The patient has agreed to transfer to the Observation Unit for completion of cardiac workup with Stress Testing in the morning.      PAST MEDICAL HISTORY     Code Status: Full Code    Past Medical History:   Diagnosis Date    Cardiomyopathy     Congestive heart failure     Diabetes mellitus     Hypertension     PVC's (premature ventricular contractions)        Past Surgical History:   Procedure Laterality Date    APPENDECTOMY      CARDIAC PACEMAKER PLACEMENT         Current/Home Medications    FUROSEMIDE (LASIX) 40 MG TABLET    Take 1 tablet (40 mg total) by mouth as needed (weight gain)    INSULIN GLARGINE 100 UNIT/ML INJECTION PEN    Inject  25 Units into the skin nightly    INSULIN LISPRO (HUMALOG) 100 UNIT/ML INJECTION PEN    Inject 8 Units into the skin 3 (three) times daily before meals    LISINOPRIL (PRINIVIL,ZESTRIL) 10 MG TABLET    Take 1 tablet (10 mg total) by mouth daily    METFORMIN (GLUCOPHAGE) 500 MG TABLET    Take 2 tablets (1,000 mg total) by mouth every morning with breakfast    METOPROLOL SUCCINATE XL (TOPROL-XL) 25 MG 24 HR TABLET    Take 25 mg by mouth every morning       Allergies: No Known Allergies    Family History   Problem Relation Age of Onset    Heart disease Father     Hypertension Father     Heart disease Sister     Heart attack Sister     Heart disease Brother     Diabetes Brother     Diabetes Paternal Grandmother        SHx:  reports that he has quit smoking. He has quit using smokeless tobacco. He reports that he does not drink alcohol or use drugs.  REVIEW OF SYSTEMS     A complete 12 point review of systems was completed and was negative except as noted in the HPI.     PHYSICAL EXAM     Vital Signs: Blood pressure 119/74, pulse 75, temperature 97.7 F (36.5 C), temperature source Oral, resp. rate 17, height 1.88 m (6\' 2" ), weight 137.3 kg (302 lb 11.1 oz), SpO2 97 %.    Physical Exam   Constitutional: He is oriented to person, place, and time. He appears well-developed and well-nourished. No distress.   HENT:   Head: Normocephalic and atraumatic.   Eyes: Pupils are equal, round, and reactive to light. Conjunctivae are normal.   Neck: Normal range of motion. Neck supple. No JVD present. No tracheal deviation present.   Cardiovascular: Normal rate, regular rhythm, normal heart sounds and intact distal pulses.   Pulmonary/Chest: Effort normal and breath sounds normal. No respiratory distress. He exhibits no tenderness.   Abdominal: Soft. Bowel sounds are normal. He exhibits no distension. There is no abdominal tenderness.   Musculoskeletal: Normal range of motion.         General: No tenderness or edema.    Neurological: He is alert and oriented to person, place, and time.   Skin: Skin is warm and dry. No rash noted.   Psychiatric: He has a normal mood and affect. His behavior is normal.       LABS & IMAGING     Labs:  Results for orders placed or performed during the hospital encounter of 03/26/18   CBC and differential   Result Value Ref Range    WBC 14.2 (H) 4.0 - 11.0 K/cmm    RBC 5.01 4.00 - 5.70 M/cmm    Hemoglobin 15.2 13.0 - 17.5 gm/dL    Hematocrit 16.1 09.6 - 52.5 %    MCV 91 80 - 100 fL    MCH 30 28 - 35 pg    MCHC 33 32 - 36 gm/dL    RDW 04.5 40.9 - 81.1 %    PLT CT 264 130 - 440 K/cmm    MPV 8.7 6.0 - 10.0 fL    NEUTROPHIL % 87.5 (H) 42.0 - 78.0 %    Lymphocytes 6.0 (L) 15.0 - 46.0 %    Monocytes 5.6 3.0 - 15.0 %    Eosinophils % 0.6 0.0 - 7.0 %    Basophils % 0.4 0.0 - 3.0 %    Neutrophils Absolute 12.5 (H) 1.7 - 8.6 K/cmm    Lymphocytes Absolute 0.8 0.6 - 5.1 K/cmm    Monocytes Absolute 0.8 0.1 - 1.7 K/cmm    Eosinophils Absolute 0.1 0.0 - 0.8 K/cmm    BASO Absolute 0.1 0.0 - 0.3 K/cmm   Basic Metabolic Panel   Result Value Ref Range    Sodium 136 136 - 147 mMol/L    Potassium 4.9 3.5 - 5.3 mMol/L    Chloride 101 98 - 110 mMol/L    CO2 26 20 - 30 mMol/L    Calcium 9.3 8.5 - 10.5 mg/dL    Glucose 914 (H) 71 - 99 mg/dL    Creatinine 7.82 9.56 - 1.30 mg/dL    BUN 27 (H) 7 - 22 mg/dL    Anion Gap 21.3 7.0 - 18.0 mMol/L    BUN/Creatinine Ratio 21.3 10.0 - 30.0 Ratio    EGFR 66 60 - 150 mL/min/1.64m2    Osmolality Calculated 287 275 - 300 mOsm/kg   Troponin I   Result Value Ref  Range    Troponin I 0.02 0.00 - 0.02 ng/mL   B-type Natriuretic Peptide   Result Value Ref Range    B-Natriuretic Peptide <10.0 0.0 - 100.0 pg/mL   Lipid panel   Result Value Ref Range    Cholesterol 201 (H) 75 - 199 mg/dL    Triglycerides 161 (H) 10 - 150 mg/dL    HDL 47 40 - 55 mg/dL    LDL Calculated 096 mg/dL    Coronary Heart Disease Risk 4.28     VLDL 46 (H) 0 - 40       Imaging:  XR Chest 2 Views   Final Result   No acute  cardiopulmonary disease.   Pacemaker is unchanged.      ReadingStation:[id number]      Treadmill    (Results Pending)   Echocardiogram Adult Complete W Clr/ Dopp Waveform    (Results Pending)         EMERGENCY DEPARTMENT COURSE     ED Medication Orders (From admission, onward)    Start Ordered     Status Ordering Provider    03/27/18 0900 03/26/18 1920  aspirin chewable tablet 81 mg  Daily     Route: Oral  Ordered Dose: 81 mg     Ordered Iana Buzan R    03/26/18 1919 03/26/18 1920  nitroglycerin (NITROSTAT) SL tablet 0.4 mg  Every 5 min PRN     Route: Sublingual  Ordered Dose: 0.4 mg     Ordered Sakeena Teall R    03/26/18 1837 03/26/18 1836  nitroglycerin (NITROSTAT) SL tablet 0.4 mg  Once in ED     Route: Sublingual  Ordered Dose: 0.4 mg     Last MAR action:  Given LESKOVEC, Kristine Garbe    03/26/18 1826 03/26/18 1825  morphine injection 2 mg  Once in ED     Route: Intravenous  Ordered Dose: 2 mg     Last MAR action:  Given LESKOVEC, WILLIAM C    03/26/18 1825 03/26/18 1825  prochlorperazine (COMPAZINE) injection 5 mg  Once in ED     Route: Intravenous  Ordered Dose: 5 mg     Last MAR action:  Given LESKOVEC, WILLIAM C    03/26/18 1756 03/26/18 1755  nitroglycerin (NITROSTAT) SL tablet 0.4 mg  Once in ED     Route: Sublingual  Ordered Dose: 0.4 mg     Last MAR action:  Given LESKOVEC, WILLIAM C    03/26/18 1727 03/26/18 1726  promethazine (PHENERGAN) 50 mg/0.4 mL topical gel 25 mg  Once in ED     Route: Topical  Ordered Dose: 25 mg     Last MAR action:  Given LESKOVEC, WILLIAM C    03/26/18 1707 03/26/18 1706  nitroglycerin (NITROSTAT) SL tablet 0.4 mg  Once in ED     Route: Sublingual  Ordered Dose: 0.4 mg     Last MAR action:  Given LESKOVEC, Kristine Garbe          ED documentation including nurses notes were reviewed by myself.  The case was discussed with the ED Attending.    ASSESSMENT & PLAN     Samuel Koch is a 48 y.o. male admitted under OBSERVATION with chest pain.    Assessment & Plan:  Transfer  to Observation Unit for cardiac work up.      Chest Pain  -Telemetry  -Serial Trop-I  -Add on d-dimer  -Repeat ECG PRN  -Lipid Panel  -ASA q day  -  Nitrates for pain as needed  -Echocardiogram AM  -Stress test a.m.  -Cardiac Consultation PRN:                I certify the need for admission to the Observation Unit based on the patient's history and the information above.    Glade Stanford [place], [place]   03/26/2018 8:33 PM

## 2018-03-26 NOTE — ED Triage Notes (Signed)
Have been cp for the last week on and off, got worse today roughly around 1300. Took aspirin at that time. Pain going down left arm. Nausea, light headed, dizzy, fatigue, and productive cough.Marland Kitchen

## 2018-03-26 NOTE — Plan of Care (Signed)
Problem: Chest Pain  Goal: Vital signs and cardiac rhythm stable  Outcome: Progressing  Flowsheets (Taken 03/26/2018 2343)  Vital signs and cardiac rhythm stable: Monitor Samuel Koch vital signs/cardiac rhythms; Monitor labs; Assess the need for oxygen therapy and administer as ordered  Goal: Cardiac pain management  Outcome: Progressing  Flowsheets (Taken 03/26/2018 2343)  Cardiac pain management : Assess/report chest pain/or related discomfort to LIP immediately; Instruct patient to report any change in pain status; Assess pain/or related discomfort on admission, during daily assessment, before and after any intervention; Include patient and patient care companion in decisions related to pain management  Goal: Patient/Patient Care Companion demonstrates understanding of disease process, treatment plan, medications, and discharge plan  Outcome: Progressing  Flowsheets (Taken 03/26/2018 2343)  Patient/Patient Care Companion demonstrates understanding of disease process, treatment plan, medications and discharge plan     : Educate patient to immediately report any chest pain/equivalent to RN; Reinforce with patient their activity level and to avoid Valsalva; Plan for discharge     Problem: Moderate/High Fall Risk Score >5  Goal: Patient will remain free of falls  Outcome: Progressing

## 2018-03-26 NOTE — Progress Notes (Signed)
NURSE NOTE SUMMARY  [facility] SHORT STAY OBS 2B   Patient Name: Samuel Koch   Attending Physician: Harriette Bouillon, MD   Today's date:   03/26/2018 LOS: 0 days   Shift Summary:                                                              2120-Pt resting in bed. Admission assessment completed, evening medications given. Pt has no questions at this time. Call bell in reach, will continue to monitor.   2140- Pt tested for flu  2230- Pt left floor for CT   Provider Notifications:      Rapid Response Notifications:  Mobility:          Weight tracking:  Family Dynamic:   Last 3 Weights for the past 72 hrs (Last 3 readings):   Weight   03/26/18 1601 137.3 kg (302 lb 11.1 oz)             Recent Vitals Last Bowel Movement   BP: 117/65 (03/26/2018 11:11 PM)  Heart Rate: 63 (03/26/2018 11:11 PM)  Temp: 98.6 F (37 C) (03/26/2018 11:11 PM)  Resp Rate: 20 (03/26/2018 11:11 PM)  Height: 1.88 m (6\' 2" ) (03/26/2018  4:01 PM)  Weight: 137.3 kg (302 lb 11.1 oz) (03/26/2018  4:01 PM)  SpO2: 97 % (03/26/2018 11:11 PM)   Last BM Date: 03/26/18

## 2018-03-26 NOTE — ED Provider Notes (Signed)
Physician/Midlevel provider first contact with patient: 03/26/18 1712         History     Chief Complaint   Patient presents with    Chest Pain    Shortness of Breath     He presents for evaluation of his intermittent gradual onset left to midsternal chest pain which has gradually worsened over the past week. He reports that his pain has radiated to his left upper arm. He reports associated nausea, upper abdominal pain, cough, and fatigue. He reports that he took a full dose aspirin at 1330 and notes that his L arm pain improved after a dose of NTG here. He has chronic neck pain which worsens with movement of his neck. He denies vomiting, diarrhea, body aches, trouble swallowing, or heartburn. He gives a history of systolic CHF 3 years ago. He is unsure of the etiology of his cardiomyopathy. He also notes that his father and brothers had CHF.  He follows with Inez Catalina, NP. He also has a history of diabetes mellitus. 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     Inez Catalina, NP, evaluated the patient yesterday. He notes that he was given IV lasix at his appointment yesterday. He notes that he was retaining fluid prior to his appointment yesterday. Her CHF assessment is below.    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   - LHC (6/17): 30% mid RCA, no other significant disease.   - Echo (6/17): EF 30-35%.   - Holter (7/17): 19% PVCs.   - Echo (3/18): EF 50-55%, moderate LVH, grade II diastolic dysfunction.   - CPX (3/18): submaximal, RER 0.99, VO2 20.8 (89% predicted), VE/VCO2 29 =>no significant HF limitation.   - Echo (9/18): EF 50-55%  - Echo 10/17/2017 ef 50%"               Past Medical History:   Diagnosis Date    Cardiomyopathy     Congestive heart failure     Diabetes mellitus     Hypertension     PVC's (premature ventricular contractions)        Past Surgical History:   Procedure Laterality Date    APPENDECTOMY      CARDIAC  PACEMAKER PLACEMENT         Family History   Problem Relation Age of Onset    Heart disease Father     Hypertension Father     Heart disease Sister     Heart attack Sister     Heart disease Brother     Diabetes Brother     Diabetes Paternal Grandmother        Social  Social History     Tobacco Use    Smoking status: Former Smoker    Smokeless tobacco: Former Neurosurgeon   Substance Use Topics    Alcohol use: No    Drug use: No       .     No Known Allergies    Home Medications     Med List Status:  Pharmacy Completed Set By: Claudie Revering, PharmD at 03/26/2018  7:39 PM        Status Comment        03/26/2018  7:40 PM    Pt admittedly noncompliant with meds as noted                furosemide (LASIX) 40 MG tablet     Take 1 tablet (40  mg total) by mouth as needed (weight gain)     insulin glargine 100 UNIT/ML injection pen     Inject 25 Units into the skin nightly     insulin lispro (HUMALOG) 100 UNIT/ML injection pen     Inject 8 Units into the skin 3 (three) times daily before meals     lisinopril (PRINIVIL,ZESTRIL) 10 MG tablet     Take 1 tablet (10 mg total) by mouth daily     metFORMIN (GLUCOPHAGE) 500 MG tablet     Take 2 tablets (1,000 mg total) by mouth every morning with breakfast     metoprolol succinate XL (TOPROL-XL) 25 MG 24 hr tablet     Take 25 mg by mouth every morning        Ongoing Comment    Stottlemyer, Minerva Areola, PharmD    03/26/2018  7:40 PM    03/26/2018 - pt admittedly noncompliant with meds as noted           Review of Systems   Constitutional: Positive for fatigue.   HENT: Negative for trouble swallowing.    Respiratory: Positive for cough.    Cardiovascular: Positive for chest pain (pain radiates to the left upper arm.).        Gives a history of systolic CHF. Reports fluid retention prior to being given lasix yesterday. 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   Gastrointestinal: Positive for abdominal pain and nausea. Negative for diarrhea and vomiting.   Endocrine:        H/o DM   Musculoskeletal:  Positive for neck pain (chronic).   All other systems reviewed and are negative.    Physical Exam    BP: 130/70, Heart Rate: 80, Temp: 97.7 F (36.5 C), Resp Rate: 20, SpO2: 98 %, Weight: 137.3 kg    Physical Exam  Vitals signs and nursing note reviewed.   Constitutional:       Appearance: He is well-developed. He is not diaphoretic.   HENT:      Head: Normocephalic and atraumatic.      Right Ear: External ear normal.      Left Ear: External ear normal.      Nose: Nose normal.      Mouth/Throat:      Pharynx: No oropharyngeal exudate.   Eyes:      General: No scleral icterus.        Right eye: No discharge.         Left eye: No discharge.      Conjunctiva/sclera: Conjunctivae normal.   Neck:      Musculoskeletal: Neck supple.      Thyroid: No thyromegaly.      Trachea: No tracheal deviation.   Cardiovascular:      Rate and Rhythm: Normal rate and regular rhythm.      Heart sounds: No murmur. No friction rub. No gallop.    Pulmonary:      Effort: Pulmonary effort is normal. No respiratory distress.      Breath sounds: Normal breath sounds. No stridor. No wheezing or rales.   Chest:      Chest wall: No tenderness.   Abdominal:      General: Bowel sounds are normal. There is no distension.      Palpations: There is no mass.      Tenderness: There is no abdominal tenderness. There is no guarding or rebound.   Musculoskeletal:         General: No tenderness.   Lymphadenopathy:  Cervical: No cervical adenopathy.   Skin:     Coloration: Skin is not pale.      Findings: No erythema or rash.   Neurological:      Mental Status: He is alert.      Motor: No abnormal muscle tone.      Coordination: Coordination normal.   Psychiatric:         Behavior: Behavior normal.         Thought Content: Thought content normal.           MDM and ED Course     ED Medication Orders (From admission, onward)    Start Ordered     Status Ordering Provider    03/27/18 0900 03/26/18 1920  aspirin chewable tablet 81 mg  Daily     Route: Oral   Ordered Dose: 81 mg     Acknowledged ENGLISH, TIFFANY R    03/26/18 1919 03/26/18 1920  nitroglycerin (NITROSTAT) SL tablet 0.4 mg  Every 5 min PRN     Route: Sublingual  Ordered Dose: 0.4 mg     Acknowledged ENGLISH, TIFFANY R    03/26/18 1837 03/26/18 1836  nitroglycerin (NITROSTAT) SL tablet 0.4 mg  Once in ED     Route: Sublingual  Ordered Dose: 0.4 mg     Last MAR action:  Given LESKOVECKristine Garbe    03/26/18 1826 03/26/18 1825  morphine injection 2 mg  Once in ED     Route: Intravenous  Ordered Dose: 2 mg     Last MAR action:  Given Krishay Faro C    03/26/18 1825 03/26/18 1825  prochlorperazine (COMPAZINE) injection 5 mg  Once in ED     Route: Intravenous  Ordered Dose: 5 mg     Last MAR action:  Given Alix Lahmann C    03/26/18 1756 03/26/18 1755  nitroglycerin (NITROSTAT) SL tablet 0.4 mg  Once in ED     Route: Sublingual  Ordered Dose: 0.4 mg     Last MAR action:  Given Ismaeel Arvelo C    03/26/18 1727 03/26/18 1726  promethazine (PHENERGAN) 50 mg/0.4 mL topical gel 25 mg  Once in ED     Route: Topical  Ordered Dose: 25 mg     Last MAR action:  Given Laiah Pouncey C    03/26/18 1707 03/26/18 1706  nitroglycerin (NITROSTAT) SL tablet 0.4 mg  Once in ED     Route: Sublingual  Ordered Dose: 0.4 mg     Last MAR action:  Given Weylyn Ricciuti C             MDM  Number of Diagnoses or Management Options  Chest pain:   Diagnosis management comments: The differential diagnosis includes, but is not limited to asthma, COPD, bronchospasm, bronchitis, hyperventilation, allergic reaction, myocardial infarction, acute respiratory failure, CHF/pulmonary edema, pericardial effusion, tamponade, pulmonary embolus, pneumothorax, pneumonia, diabetic ketoacidosis.    Spoke with [employer], PA. She is going to evaluate the patient for serial cardiac enzymes and telemetry monitoring.    The patient presents with chest pain of unclear etiology.  Initial cardiac markers and EKG are nondiagnostic. Based  upon the presentation the patient appears to be at low risk for PE, aortic dissection, pneumothorax, pneumonia or other serious conditions. The plan is to admit this patient for cardiac evaluation, observation and further testing.  The admission plan was discussed with the patient and/or family and they will comply.  Results of lab/radiology/EKG tests were discussed  with the patient and/or family. All questions were answered and concerns addressed and appropriate consultation was obtained.                   Procedures    Clinical Impression & Disposition     Clinical Impression  Final diagnoses:   Chest pain        ED Disposition     ED Disposition Condition Date/Time Comment    Admit  Thu Mar 26, 2018  7:23 PM            Current Discharge Medication List                    Myna Hidalgo, MD  03/27/18 [phone]

## 2018-03-27 ENCOUNTER — Observation Stay: Payer: 59

## 2018-03-27 ENCOUNTER — Encounter: Payer: Self-pay | Admitting: Family

## 2018-03-27 LAB — VH URINALYSIS WITH MICROSCOPIC AND CULTURE IF INDICATED
Bilirubin, UA: NEGATIVE
Bilirubin, UA: NEGATIVE
Blood, UA: NEGATIVE
Blood, UA: NEGATIVE
Glucose, UA: NEGATIVE mg/dL
Glucose, UA: >=500 mg/dL — AB
Ketones UA: NEGATIVE mg/dL
Ketones UA: NEGATIVE mg/dL
Leukocyte Esterase, UA: 25 Leu/uL — AB
Leukocyte Esterase, UA: NEGATIVE Leu/uL
Nitrite, UA: NEGATIVE
Nitrite, UA: NEGATIVE
Protein, UR: NEGATIVE mg/dL
Protein, UR: NEGATIVE mg/dL
RBC, UA: 1 /hpf (ref 0–4)
RBC, UA: 1 /hpf (ref 0–4)
Squam Epithel, UA: 3 /hpf — ABNORMAL HIGH (ref 0–2)
Squam Epithel, UA: 1 /hpf (ref 0–2)
Urine Specific Gravity: 1.032 (ref 1.001–1.040)
Urine Specific Gravity: 1.024 (ref 1.001–1.040)
Urobilinogen, UA: NORMAL mg/dL
Urobilinogen, UA: NORMAL mg/dL
WBC, UA: 2 /hpf (ref 0–4)
WBC, UA: 1 /hpf (ref 0–4)
pH, Urine: 6 pH (ref 5.0–8.0)
pH, Urine: 7 pH (ref 5.0–8.0)

## 2018-03-27 LAB — VH ADENOVIRUS, RAPID: Respiratory or Stool Adenovirus Rapid: NEGATIVE

## 2018-03-27 LAB — CBC AND DIFFERENTIAL
Basophils %: 0.7 % (ref 0.0–3.0)
Basophils Absolute: 0 10*3/uL (ref 0.0–0.3)
Eosinophils %: 1 % (ref 0.0–7.0)
Eosinophils Absolute: 0.1 10*3/uL (ref 0.0–0.8)
Hematocrit: 41 % (ref 39.0–52.5)
Hemoglobin: 13.7 gm/dL (ref 13.0–17.5)
Lymphocytes Absolute: 2 10*3/uL (ref 0.6–5.1)
Lymphocytes: 31.5 % (ref 15.0–46.0)
MCH: 31 pg (ref 28–35)
MCHC: 33 gm/dL (ref 32–36)
MCV: 92 fL (ref 80–100)
MPV: 8.6 fL (ref 6.0–10.0)
Monocytes Absolute: 0.8 10*3/uL (ref 0.1–1.7)
Monocytes: 12.1 % (ref 3.0–15.0)
Neutrophils %: 54.7 % (ref 42.0–78.0)
Neutrophils Absolute: 3.4 10*3/uL (ref 1.7–8.6)
PLT CT: 210 10*3/uL (ref 130–440)
RBC: 4.47 10*6/uL (ref 4.00–5.70)
RDW: 11.3 % (ref 11.0–14.0)
WBC: 6.3 10*3/uL (ref 4.0–11.0)

## 2018-03-27 LAB — BASIC METABOLIC PANEL
Anion Gap: 9.1 mMol/L (ref 7.0–18.0)
BUN / Creatinine Ratio: 21.1 Ratio (ref 10.0–30.0)
BUN: 20 mg/dL (ref 7–22)
CO2: 28 mMol/L (ref 20–30)
Calcium: 8.5 mg/dL (ref 8.5–10.5)
Chloride: 103 mMol/L (ref 98–110)
Creatinine: 0.95 mg/dL (ref 0.80–1.30)
EGFR: 94 mL/min/{1.73_m2} (ref 60–150)
Glucose: 165 mg/dL — ABNORMAL HIGH (ref 71–99)
Osmolality Calculated: 278 mOsm/kg (ref 275–300)
Potassium: 4.1 mMol/L (ref 3.5–5.3)
Sodium: 136 mMol/L (ref 136–147)

## 2018-03-27 LAB — TROPONIN I: Troponin I: 0.02 ng/mL (ref 0.00–0.02)

## 2018-03-27 LAB — VH RSV RAPID TEST: Respiratory Syncytial Virus: NEGATIVE

## 2018-03-27 MED ORDER — PERFLUTREN LIPID MICROSPHERE 6.52 MG/ML IV SUSP
INTRAVENOUS | Status: AC
Start: 2018-03-27 — End: ?
  Filled 2018-03-27: qty 2

## 2018-03-27 MED ORDER — VH PERFLUTREN LIPID MICROSPHERE 6.52 MG/ML IV SUSP
Freq: Once | INTRAVENOUS | Status: AC
Start: 2018-03-27 — End: 2018-03-27

## 2018-03-27 NOTE — Plan of Care (Signed)
Problem: Chest Pain  Goal: Vital signs and cardiac rhythm stable  Outcome: Progressing  Flowsheets (Taken 03/26/2018 2343 by Reva Bores, RN)  Vital signs and cardiac rhythm stable: Monitor Samuel Koch vital signs/cardiac rhythms;Monitor labs;Assess the need for oxygen therapy and administer as ordered  Goal: Cardiac pain management  Outcome: Progressing  Flowsheets (Taken 03/26/2018 2343 by Reva Bores, RN)  Cardiac pain management : Assess/report chest pain/or related discomfort to LIP immediately;Instruct patient to report any change in pain status;Assess pain/or related discomfort on admission, during daily assessment, before and after any intervention;Include patient and patient care companion in decisions related to pain management  Goal: Patient/Patient Care Companion demonstrates understanding of disease process, treatment plan, medications, and discharge plan  Outcome: Progressing  Flowsheets (Taken 03/26/2018 2343 by Reva Bores, RN)  Patient/Patient Care Companion demonstrates understanding of disease process, treatment plan, medications and discharge plan     : Educate patient to immediately report any chest pain/equivalent to RN;Reinforce with patient their activity level and to avoid Valsalva;Plan for discharge     Problem: Moderate/High Fall Risk Score >5  Goal: Patient will remain free of falls  Outcome: Progressing  Flowsheets (Taken 03/26/2018 2126 by Reva Bores, RN)  VH Moderate Risk (6-13): ALL REQUIRED LOW INTERVENTIONS;INITIATE YELLOW "FALL RISK" SIGNAGE;YELLOW NON-SKID SLIPPERS;YELLOW "FALL RISK" ARM BAND;USE OF BED EXIT ALARM IF PATIENT IS CONFUSED OR IMPULSIVE. PLACE RESET BED ALARM SIGN ABOVE BED;PLACE FALL RISK LEVEL ON WHITE BOARD FOR COMMUNICATION PURPOSES IN PATIENT'S ROOM

## 2018-03-27 NOTE — UM Notes (Addendum)
[facility] Utilization Management Review Sheet    Facility :  [facility]    NAME: Breck [place]  MR#: [id number]    CSN#: [id number]    ROOM: 228/228-A AGE: 48 y.o.    ADMIT DATE AND TIME: 03/26/2018  4:05 PM      PATIENT CLASS: OBSERVATION  03/26/18 @ 1916 ADMIT OBSERVATION-[facility] SHORT STAY UNIT  [facility] CRITERIA OC 009    ATTENDING PHYSICIAN: Harriette Bouillon, MD  PAYOR:Payor: Cherlyn Labella / Plan: Jackolyn Confer EXCHANGE / Product Type: BCBS /       AUTH #: NPR    DIAGNOSIS:     ICD-10-CM    1. Chest pain R07.9        HISTORY:   Past Medical History:   Diagnosis Date    Cardiomyopathy     Congestive heart failure     Diabetes mellitus     Hypertension     PVC's (premature ventricular contractions)        DATE OF REVIEW: 03/27/2018      Active Hospital Problems    Diagnosis    Chest pain     DATE OF ED TREATMENT- 03/26/18  TREATMENT IN ED:  NITROSTAT SL X 3, PHENERGAN GEL, MS IV     OBSERVATION REVIEW    HPI: Presents with chest pain for the past week. Patient has history of cardiomyopathy, status post PPM and CHF.  He was seen at the heart failure clinic yesterday by Jacques Earthly, NP.  He did a 40-day fast in November and December.  He admittedly stopped taking all of his medication during this time.  He states yesterday t he had "gained 10 pounds".  He has been experiencing extreme fatigue, worse with minimal exertion.  He has noticed intermittent chest pain which became worse today just after 1 PM.  Described as substernal pain with radiation to left arm.  Some associated nausea.      ABNORMAL LABS: WBC 14.2 GLUC 274 BUN 27 trop 0.02 D-DIMER 0.69 GLUC 165  EKG  NONDIAGNOSTIC    DIAGNOSTIC TESTING: XR CHEST- No acute cardiopulmonary disease.Pacemaker is unchangedCT ANGIOGRAM CHEST ( DONE IN OBS @ 2221) Pulmonary angio CT showing no evidence of PE. No acute process identified.     VS: Blood pressure 119/74, pulse 75, temperature 97.7 F (36.5 C), temperature source Oral, resp. rate 33, height 1.88 m (6'  2"), weight 137.3 kg (302 lb 11.1 oz), SpO2 97 %. TEMP MAX 101.2     ASSESSMENT AND PLAN:  CHEST PAIN  PLAN: TELEMETRY, SERIAL TROPS, ASA, ECHO, STRESS TEST     OBSERVATION ORDERS   VS Q 4 HRS, TELEMETRY, SERIAL TROPS, ASA, ECHO, NST    DAY 2 OBSERVATION FOLLOW-UP  03/27/18  LABS: GLUC 165  TROPS NEG  DIAGNOSTIC TESTING- ECHOThe left ventricular cavity size is normal. There is mild concentric left ventricular hypertrophy. The left ventricular systolic function is at the lower limits of normal. The left ventricular ejection fraction is  estimated at 50%. There are no regional wall motion abnormalities present. The left ventricular diastolic filling pattern is probably pseudonormalized, consistent with grade 2 left ventricular diastolic dysfunction and elevated left atrial pressure. Right-sided chambers are not optimally visualized but appear grossly normal in size. Pacemaker lead is visualized within the right heart chamber. There is no significant valvular heart disease. Previous study 10/07/17 was reviewed.  There has been no significant interval change.   VS: BP 124/77    Pulse 61  Temp 98.2 F (36.8 C) (Oral)    Resp 18    Ht 1.88 m (6\' 2" )    Wt 137.3 kg (302 lb 11.1 oz)    SpO2 96%    BMI 38.86 kg/m   CURRENT ORDERS AS ABOVE    Assessment:   48 year old male with a known history of cardiomyopathy and congestive heart failure with pacemaker placement presents with chest pain along with weakness fatigue and shortness of breath.  Patient spiked a fever last night.  Etiology unclear.  Plan:   1.  Telemetry monitoring overnight  2.  Nuclear stress test in the morning and if negative he can be discharged home for follow-up with [facility] cardiology.    Wende Mott, RN  Utilization Management  Case Management Department    [facility]  [address]  [place], [place] [address]  T [phone]     F [phone]    [email or web address]

## 2018-03-27 NOTE — Plan of Care (Signed)
Problem: Chest Pain  Goal: Vital signs and cardiac rhythm stable  Outcome: Progressing  Goal: Cardiac pain management  Outcome: Progressing  Goal: Patient/Patient Care Companion demonstrates understanding of disease process, treatment plan, medications, and discharge plan  Outcome: Progressing     Problem: Moderate/High Fall Risk Score >5  Goal: Patient will remain free of falls  Outcome: Progressing

## 2018-03-27 NOTE — Progress Notes (Signed)
OBSERVATION UNIT  PROGRESS NOTE    Patient Name: Samuel Koch, Samuel Koch  Time: 03/27/18 3:14 PM    Subjective:   48 year old male presented yesterday to the emergency department with chest pain and 1 week of weakness fatigue and low energy.  He has been spiking fevers overnight to a T-max of 101.7.  No identifiable source of infection.  His echocardiogram showed an ejection fraction of 50 and no interval change.  Enzymes are negative BNP is normal.  His chronic systolic diastolic congestive heart failure seems to be stable.  He was just seen in heart failure clinic a few days ago.  We will keep the patient tonight for observation and do a nuclear stress test in the morning.  If that is negative he can be discharged home.    Objective:     Vitals:    03/27/18 1101   BP: 124/77   Pulse: 61   Resp: 18   Temp: 98.2 F (36.8 C)   SpO2: 96%       Physical Examination: General appearance - alert, well appearing, and in no distress  Mental status - alert, oriented to person, place, and time  Eyes - pupils equal and reactive, extraocular eye movements intact  Chest - clear to auscultation, no wheezes, rales or rhonchi, symmetric air entry  Heart - normal rate, regular rhythm, normal S1, S2, no murmurs, rubs, clicks or gallops  Abdomen - soft, nontender, nondistended, no masses or organomegaly  Musculoskeletal - no joint tenderness, deformity or swelling  Extremities - peripheral pulses normal, no pedal edema, no clubbing or cyanosis      Labs:  Recent Results (from the past 24 hour(s))   ECG 12 lead    Collection Time: 03/26/18  4:09 PM   Result Value Ref Range    Patient Age 46 years    Patient DOB [birth year]     Patient Height      Patient Weight      Interpretation Text       Sinus rhythm  Borderline low voltage, extremity leads  Compared to ECG 10/06/2017 20:05:55  Atrial-paced complex(es) or rhythm no longer present  Myocardial infarct finding no longer present      Physician Interpreter      Ventricular Rate 65  //min    QRS Duration 91 ms    P-R Interval 178 ms    Q-T Interval 444 ms    Q-T Interval(Corrected) 462 ms    P Wave Axis 57 deg    QRS Axis 39 deg    T Axis 47 years   CBC and differential    Collection Time: 03/26/18  4:24 PM   Result Value Ref Range    WBC 14.2 (H) 4.0 - 11.0 K/cmm    RBC 5.01 4.00 - 5.70 M/cmm    Hemoglobin 15.2 13.0 - 17.5 gm/dL    Hematocrit 16.1 09.6 - 52.5 %    MCV 91 80 - 100 fL    MCH 30 28 - 35 pg    MCHC 33 32 - 36 gm/dL    RDW 04.5 40.9 - 81.1 %    PLT CT 264 130 - 440 K/cmm    MPV 8.7 6.0 - 10.0 fL    NEUTROPHIL % 87.5 (H) 42.0 - 78.0 %    Lymphocytes 6.0 (L) 15.0 - 46.0 %    Monocytes 5.6 3.0 - 15.0 %    Eosinophils % 0.6 0.0 - 7.0 %    Basophils % 0.4  0.0 - 3.0 %    Neutrophils Absolute 12.5 (H) 1.7 - 8.6 K/cmm    Lymphocytes Absolute 0.8 0.6 - 5.1 K/cmm    Monocytes Absolute 0.8 0.1 - 1.7 K/cmm    Eosinophils Absolute 0.1 0.0 - 0.8 K/cmm    BASO Absolute 0.1 0.0 - 0.3 K/cmm   Basic Metabolic Panel    Collection Time: 03/26/18  4:24 PM   Result Value Ref Range    Sodium 136 136 - 147 mMol/L    Potassium 4.9 3.5 - 5.3 mMol/L    Chloride 101 98 - 110 mMol/L    CO2 26 20 - 30 mMol/L    Calcium 9.3 8.5 - 10.5 mg/dL    Glucose 295 (H) 71 - 99 mg/dL    Creatinine 6.21 3.08 - 1.30 mg/dL    BUN 27 (H) 7 - 22 mg/dL    Anion Gap 65.7 7.0 - 18.0 mMol/L    BUN/Creatinine Ratio 21.3 10.0 - 30.0 Ratio    EGFR 66 60 - 150 mL/min/1.81m2    Osmolality Calculated 287 275 - 300 mOsm/kg   Troponin I    Collection Time: 03/26/18  4:24 PM   Result Value Ref Range    Troponin I 0.02 0.00 - 0.02 ng/mL   B-type Natriuretic Peptide    Collection Time: 03/26/18  4:24 PM   Result Value Ref Range    B-Natriuretic Peptide <10.0 0.0 - 100.0 pg/mL   Hemoglobin A1C    Collection Time: 03/26/18  4:24 PM   Result Value Ref Range    Hgb A1C, % 8.2 %   Lipid panel    Collection Time: 03/26/18  4:24 PM   Result Value Ref Range    Cholesterol 201 (H) 75 - 199 mg/dL    Triglycerides 846 (H) 10 - 150 mg/dL    HDL 47 40 -  55 mg/dL    LDL Calculated 962 mg/dL    Coronary Heart Disease Risk 4.28     VLDL 46 (H) 0 - 40   D-dimer, quantitative    Collection Time: 03/26/18  4:24 PM   Result Value Ref Range    D-Dimer 0.69 (H) 0.19 - 0.52 mg/L FEU   Troponin I    Collection Time: 03/26/18  9:05 PM   Result Value Ref Range    Troponin I 0.02 0.00 - 0.02 ng/mL   Influenza A / B Rapid Test    Collection Time: 03/26/18  9:44 PM   Result Value Ref Range    Influenza A Negative Negative    Influenza B Negative Negative   RSV Rapid Test    Collection Time: 03/26/18  9:44 PM   Result Value Ref Range    Respiratory Syncytial Virus Negative Negative   Adenovirus, Rapid Test    Collection Time: 03/26/18  9:44 PM   Result Value Ref Range    Adenovirus, Rapid Negative Negative   Troponin I    Collection Time: 03/27/18 12:05 AM   Result Value Ref Range    Troponin I 0.02 0.00 - 0.02 ng/mL   CBC and differential    Collection Time: 03/27/18  1:00 PM   Result Value Ref Range    WBC 6.3 4.0 - 11.0 K/cmm    RBC 4.47 4.00 - 5.70 M/cmm    Hemoglobin 13.7 13.0 - 17.5 gm/dL    Hematocrit 95.2 84.1 - 52.5 %    MCV 92 80 - 100 fL    MCH 31 28 - 35  pg    MCHC 33 32 - 36 gm/dL    RDW 43.3 29.5 - 18.8 %    PLT CT 210 130 - 440 K/cmm    MPV 8.6 6.0 - 10.0 fL    NEUTROPHIL % 54.7 42.0 - 78.0 %    Lymphocytes 31.5 15.0 - 46.0 %    Monocytes 12.1 3.0 - 15.0 %    Eosinophils % 1.0 0.0 - 7.0 %    Basophils % 0.7 0.0 - 3.0 %    Neutrophils Absolute 3.4 1.7 - 8.6 K/cmm    Lymphocytes Absolute 2.0 0.6 - 5.1 K/cmm    Monocytes Absolute 0.8 0.1 - 1.7 K/cmm    Eosinophils Absolute 0.1 0.0 - 0.8 K/cmm    BASO Absolute 0.0 0.0 - 0.3 K/cmm   Basic Metabolic Panel    Collection Time: 03/27/18  1:00 PM   Result Value Ref Range    Sodium 136 136 - 147 mMol/L    Potassium 4.1 3.5 - 5.3 mMol/L    Chloride 103 98 - 110 mMol/L    CO2 28 20 - 30 mMol/L    Calcium 8.5 8.5 - 10.5 mg/dL    Glucose 416 (H) 71 - 99 mg/dL    Creatinine 6.06 3.01 - 1.30 mg/dL    BUN 20 7 - 22 mg/dL    Anion Gap  9.1 7.0 - 18.0 mMol/L    BUN/Creatinine Ratio 21.1 10.0 - 30.0 Ratio    EGFR 94 60 - 150 mL/min/1.31m2    Osmolality Calculated 278 275 - 300 mOsm/kg   Urinalysis w Microscopic and Culture if Indicated    Collection Time: 03/27/18  1:15 PM   Result Value Ref Range    Color, UA Yellow Colorless,Yellow,Straw    Clarity, UA Slightly Cloudy (A) Clear    Specific Gravity, UR 1.032 1.001 - 1.040    pH, Urine 6.0 5.0 - 8.0 pH    Protein, UR Negative Negative mg/dL    Glucose, UA Negative Negative mg/dL    Ketones UA Negative Negative,5 mg/dL    Bilirubin, UA Negative Negative    Blood, UA Negative Negative    Nitrite, UA Negative Negative    Urobilinogen, UA Normal Normal mg/dL    Leukocyte Esterase, UA 25 (A) Negative Leu/uL    UR Micro Performed     WBC, UA 2 0 - 4 /hpf    RBC, UA 1 0 - 4 /hpf    Squam Epithel, UA 3 (H) 0 - 2 /hpf       Imaging:  Xr Chest 2 Views    Result Date: 03/26/2018  No acute cardiopulmonary disease. Pacemaker is unchanged. ReadingStation:[id number]    Ct Angiogram Chest    Result Date: 03/26/2018  IMPRESSION: Pulmonary angio CT showing no evidence of PE. No acute process identified. ReadingStation:[id number]      Medications:  Current Facility-Administered Medications   Medication Dose Route Frequency    aspirin  81 mg Oral Daily    enoxaparin  40 mg Subcutaneous QHS    lisinopril  10 mg Oral Daily    metFORMIN  1,000 mg Oral QAM W/BREAKFAST    metoprolol succinate XL  25 mg Oral QAM       Assessment:   48 year old male with a known history of cardiomyopathy and congestive heart failure with pacemaker placement presents with chest pain along with weakness fatigue and shortness of breath.  Patient spiked a fever last night.  Etiology unclear.    Past Medical  History:   Diagnosis Date    Cardiomyopathy     Congestive heart failure     Diabetes mellitus     Hypertension     PVC's (premature ventricular contractions)        Plan:   1.  Telemetry monitoring overnight  2.  Nuclear stress test in the  morning and if negative he can be discharged home for follow-up with [facility] cardiology.      Dedra Skeens, MD

## 2018-03-27 NOTE — Progress Notes (Addendum)
NURSE NOTE SUMMARY  [facility] - CLINICAL OBSERVATION UNIT   Patient Name: Samuel Koch   Attending Physician: Harriette Bouillon, MD   Today's date:   03/27/2018 LOS: 0 days   Shift Summary:                                                              8469: received bedside shift report. Patient resting supine talking on the phone. No s/s of distress at this time. Side rails up x2 call bell within reach will continue to monitor   2345: removed food and drinks from patient bedside table patient awake informed him it was close to midnight he stated he could no longer eat or drink. Side rails up x2 call bell within reach will continue to monitor    Provider Notifications:      Rapid Response Notifications:  Mobility:      PMP Activity: Step 6 - Walks in Room (03/27/2018  7:49 AM)     Weight tracking:  Family Dynamic:   Last 3 Weights for the past 72 hrs (Last 3 readings):   Weight   03/26/18 1601 137.3 kg (302 lb 11.1 oz)             Recent Vitals Last Bowel Movement   BP: 129/72 (03/27/2018  5:27 PM)  Heart Rate: 61 (03/27/2018  5:27 PM)  Temp: 98.1 F (36.7 C) (03/27/2018  5:27 PM)  Resp Rate: 20 (03/27/2018  5:27 PM)  Height: 1.88 m (6\' 2" ) (03/26/2018  4:01 PM)  Weight: 137.3 kg (302 lb 11.1 oz) (03/26/2018  4:01 PM)  SpO2: 96 % (03/27/2018  5:27 PM)   Last BM Date: 03/26/18

## 2018-03-28 ENCOUNTER — Observation Stay: Payer: 59

## 2018-03-28 MED ORDER — NITROGLYCERIN 2 % TD OINT
1.00 [in_us] | TOPICAL_OINTMENT | TRANSDERMAL | Status: DC
Start: 2018-03-28 — End: 2018-03-30
  Administered 2018-03-28 – 2018-03-30 (×5): 1 [in_us] via TOPICAL
  Filled 2018-03-28 (×3): qty 1

## 2018-03-28 MED ORDER — REGADENOSON 0.4 MG/5ML IV SOLN
0.40 mg | Freq: Once | INTRAVENOUS | Status: AC
Start: 2018-03-28 — End: 2018-03-28
  Administered 2018-03-28: 08:00:00 0.4 mg via INTRAVENOUS
  Filled 2018-03-28: qty 5

## 2018-03-28 MED ORDER — MORPHINE SULFATE 4 MG/ML IJ/IV SOLN (WRAP)
2.0000 mg | Status: DC | PRN
Start: 2018-03-28 — End: 2018-03-30

## 2018-03-28 MED ORDER — REGADENOSON 0.4 MG/5ML IV SOLN
INTRAVENOUS | Status: AC
Start: 2018-03-28 — End: ?
  Filled 2018-03-28: qty 5

## 2018-03-28 MED ORDER — TECHNETIUM TC 99M SESTAMIBI - CARDIOLITE
10.00 | Freq: Once | Status: AC | PRN
Start: 2018-03-28 — End: 2018-03-28
  Administered 2018-03-28: 07:00:00 11 via INTRAVENOUS

## 2018-03-28 MED ORDER — ATORVASTATIN CALCIUM 40 MG PO TABS
80.0000 mg | ORAL_TABLET | Freq: Every evening | ORAL | Status: DC
Start: 2018-03-28 — End: 2018-03-30
  Administered 2018-03-28 – 2018-03-29 (×2): 80 mg via ORAL
  Filled 2018-03-28 (×2): qty 2

## 2018-03-28 MED ORDER — TECHNETIUM TC 99M SESTAMIBI - CARDIOLITE
30.00 | Freq: Once | Status: AC | PRN
Start: 2018-03-28 — End: 2018-03-28
  Administered 2018-03-28: 08:00:00 33 via INTRAVENOUS

## 2018-03-28 MED ORDER — NITROGLYCERIN 2 % TD OINT
TOPICAL_OINTMENT | TRANSDERMAL | Status: AC
Start: 2018-03-28 — End: 2018-03-29
  Filled 2018-03-28: qty 1

## 2018-03-28 NOTE — Consults (Signed)
Cardiology Consultation Note       Date Time: 03/28/18 1:29 PM  Patient Name: Samuel Koch, Samuel Koch  MRN#: [id number]  DOB: 30-Jun-1970  PCP: Patsy Lager, MD  Attending: Harriette Bouillon, MD    Primary Cardiologist: Wyn Forster, DO       Reason for Consultation:   Chest pain      Assessment / Plan   Exertional angina, fairly classic presentation. Also profound fatigue, DOE. Stress test shows severe diaphragm attenuation artifact which limits interpretability, but symptoms are classic enough that I think a heart catheterization is reasonable. If no culprit lesion is seen then would also perform RHC.    I thank you for the opportunity to participate in the care of this patient.     Electronically signed by: Teressa Senter, MD       Problem List:   Active Problems:    Chest pain        History of Present Illness:   Samuel Koch is a 48 y.o. male who follows with Dr. [name] and at the HF clinic, with history of NICM and LVEF around 50% (last echo two days ago, LVEF stable). Has had extreme fatigue, very easy DOE, and then has developed chest pain radiating to the left arm whenever he exerts himself more than a slow walk. He received Lasix 80mg  IV in HF clinic on 2/19 and he felt this helped a lot with his swelling, but fatigue and exertional chest pain is unchanged.    EKG:   EKG:  Nsr, low limb lead voltage due to body habitus    Review of Systems:   A comprehensive review of systems was: Otherwise negative except as stated above.    Past Medical History:     Past Medical History:   Diagnosis Date    Cardiomyopathy     Congestive heart failure     Diabetes mellitus     Hypertension     PVC's (premature ventricular contractions)        Past Surgical History:    has a past surgical history that includes Appendectomy and Cardiac pacemaker placement.    Family History:   family history includes Diabetes in his brother and paternal grandmother; Heart attack in his sister; Heart disease in his brother,  father, and sister; Hypertension in his father.    Social History:    reports that he has quit smoking. He has quit using smokeless tobacco. He reports that he does not drink alcohol or use drugs.    Allergies:   Patient has no known allergies.    Medications:     aspirin, 81 mg, Oral, Daily  enoxaparin, 40 mg, Subcutaneous, QHS  lisinopril, 10 mg, Oral, Daily  metFORMIN, 1,000 mg, Oral, QAM W/BREAKFAST  metoprolol succinate XL, 25 mg, Oral, QAM        Current Facility-Administered Medications   Medication         Physical Exam:     Tmax: Temp (24hrs), Avg:98.1 F (36.7 C), Min:97.7 F (36.5 C), Max:98.6 F (37 C)     BP: 144/83   Heart Rate: 62   SpO2: 97 %    Wt Readings from Last 3 Encounters:   03/26/18 137.3 kg (302 lb 11.1 oz)   03/25/18 139.8 kg (308 lb 2 oz)   03/09/18 135.5 kg (298 lb 12.8 oz)           No intake or output data in the 24 hours ending 03/28/18 1329  Constitutional -  Well appearing, and in no distress  Eyes - Pupils equal and reactive, extraocular eye movements intact, sclera anicteric  Head -  Normocephalic, atraumatic  Neck - supple, no significant adenopathy, no thyromegaly, no masses  Oropharynx - Moist mucous membranes  Respiratory - Clear to auscultation bilaterally, normal respiratory effort  Cardiovascular system - rrr, no murmur  Abdomen - soft, nontender, nondistended, no masses or organomegaly  Neurological - Alert, oriented, no focal neurological deficits  Extremities -  Trace edema  Skin - Warm and dry, no rashes, normal color   Psych- Appropriate affect      Labs Reviewed:     Recent Labs   Lab 03/27/18  1300 03/26/18  1624 03/25/18  1029   Glucose 165* 274*  --    BUN 20 27*  --    Creatinine 0.95 1.27  --    i-STAT Creatinine  --   --  0.80   Sodium 136 136  --    Potassium 4.1 4.9  --    Chloride 103 101  --    CO2 28 26  --          Recent Labs   Lab 03/26/18  1624   Cholesterol 201*   Triglycerides 228*   HDL 47   LDL Calculated 108     Recent Labs   Lab 03/27/18   1300 03/26/18  1624   WBC 6.3 14.2*   Hemoglobin 13.7 15.2   Hematocrit 41.0 45.6   PLT CT 210 264     Recent Labs   Lab 03/27/18  0005 03/26/18  2105 03/26/18  1624   Troponin I 0.02 0.02 0.02     Recent Labs   Lab 03/26/18  1624   B-Natriuretic Peptide <10.0     Lab Results   Component Value Date    HGBA1CPERCNT 8.2 03/26/2018    HGBA1CPERCNT 10.7 10/06/2017          Estimated Creatinine Clearance: 138.6 mL/min (based on SCr of 0.95 mg/dL).    Radiology Studies in last 24 hours:   No results found.

## 2018-03-28 NOTE — Plan of Care (Signed)
Problem: Chest Pain  Goal: Vital signs and cardiac rhythm stable  Outcome: Progressing  Flowsheets (Taken 03/28/2018 2016)  Vital signs and cardiac rhythm stable: Monitor labs; Assess the need for oxygen therapy and administer as ordered; Monitor /assess vital signs/cardiac rhythms  Goal: Cardiac pain management  Outcome: Progressing  Flowsheets (Taken 03/28/2018 2016)  Cardiac pain management : Instruct patient to report any change in pain status; Assess/report chest pain/or related discomfort to LIP immediately; Assess pain/or related discomfort on admission, during daily assessment, before and after any intervention; Include patient and patient care companion in decisions related to pain management  Goal: Patient/Patient Care Companion demonstrates understanding of disease process, treatment plan, medications, and discharge plan  Outcome: Progressing  Flowsheets (Taken 03/28/2018 2016)  Patient/Patient Care Companion demonstrates understanding of disease process, treatment plan, medications and discharge plan     : Reinforce with patient their activity level and to avoid Valsalva; Educate patient to immediately report any chest pain/equivalent to RN; Reinforce regarding cardiac diet and any fluid parameters; Assist patient/patient care companion to identify measures for cardiac risk factor management; Consult/collaborate with Cardiac Rehabilitation; Educate patient/patient care companion regarding identified learning needs; Provide appropriate resources and referrals; Plan for discharge

## 2018-03-28 NOTE — Progress Notes (Addendum)
NURSE NOTE SUMMARY  [facility] - CLINICAL OBSERVATION UNIT   Patient Name: Samuel Koch   Attending Physician: Harriette Bouillon, MD   Today's date:   03/28/2018 LOS: 0 days   Shift Summary:                                                              2000- Shift assessment completed and VSS at this time. Pt currently relaxing in recliner with family at bedside, pt updated on plan of care, call bell within reach, food/drink offered, tele box remains in place, and pt has no other needs at this time. Will continue to monitor  2200- Nightly medication administered, pt currently relaxing in bed, pt updated on plan of care, call bell within reach, food/drink offered, tele box remains in place, and pt has no other needs at this time. Will continue to monitor  0015- Nitro paste removed. Pt currently sleeping with no signs of distress, call bell within reach, side rails up x2, tele box remains in place. Will continue to monitor  0200- Pt currently sleeping with no signs of distress, call bell within reach, side rails up x2, tele box remains in place. Will continue to monitor  0500- Pt up to bathroom without any difficulty- new tele leads applied pt and gown changed due to sweat. Pt fan ordered.  Pt currently relaxing, pt updated on plan of care, call bell within reach, food/drink offered, tele box remains in place, and pt has no other needs at this time. Will continue to monitor  0600- Pt currently sleeping with no signs of distress, call bell within reach, side rails up x2, tele box remains in place. Will continue to monitor   Provider Notifications:      Rapid Response Notifications:  Mobility:      PMP Activity: Step 7 - Walks out of Room (03/28/2018  8:00 PM)     Weight tracking:  Family Dynamic:   Last 3 Weights for the past 72 hrs (Last 3 readings):   Weight   03/26/18 1601 137.3 kg (302 lb 11.1 oz)             Recent Vitals Last Bowel Movement   BP: 116/75 (03/28/2018  7:16 PM)  Heart Rate: (!) 56  (03/28/2018  7:16 PM)  Temp: 97.9 F (36.6 C) (03/28/2018  7:16 PM)  Resp Rate: 18 (03/28/2018  7:16 PM)  SpO2: 95 % (03/28/2018  7:16 PM)   Last BM Date: 03/27/18

## 2018-03-28 NOTE — Progress Notes (Signed)
OBSERVATION UNIT  PROGRESS NOTE    Patient Name: Samuel Koch, Samuel Koch  Time: 03/28/18 4:40 PM    Subjective:   Patient up pacing halls went in to discuss NM stress test results with patient and wife. Patient complaining of 4/10 left sided chest pain radiating in to his left arm with dizziness. He denies any palpitations or shortness of breath.  Patient's EKG at this time had no changes.  By the time nitro was retrieved patient laid down and chest pain went away.  Then reoccurred later this afternoon while walking around again with exertion chest pain radiating to his left arm.  Vital signs are stable.  1 sublingual nitroglycerin was given and pain went from a 4/10 to a 1/10.    Objective:     Vitals:    03/28/18 1510   BP: 151/89   Pulse: 63   Resp: 18   Temp: 98.2 F (36.8 C)   SpO2: 98%       .Marland KitchenPhysical Exam   Constitutional: He is oriented to person, place, and time. He appears well-developed and well-nourished. No distress.   HENT:   Head: Normocephalic and atraumatic.   Nose: Nose normal.   Mouth/Throat: Oropharynx is clear and moist. No oropharyngeal exudate.   Eyes: Pupils are equal, round, and reactive to light. Conjunctivae and EOM are normal. No scleral icterus.   Neck: Normal range of motion. Neck supple. No JVD present. No tracheal deviation present. No thyromegaly present.   Cardiovascular: Normal rate, regular rhythm, normal heart sounds and intact distal pulses. Exam reveals no gallop and no friction rub.   No murmur heard.  Pulmonary/Chest: Effort normal and breath sounds normal. No stridor. No respiratory distress. He has no wheezes. He has no rales.   Abdominal: Soft. Bowel sounds are normal. He exhibits no distension. There is no abdominal tenderness. There is no rebound.   Musculoskeletal: Normal range of motion.         General: No tenderness, deformity or edema.   Lymphadenopathy:     He has no cervical adenopathy.   Neurological: He is alert and oriented to person, place, and time. He has  normal reflexes. Coordination normal.   Skin: Skin is warm and dry. No rash noted. He is not diaphoretic.   Psychiatric: He has a normal mood and affect. His behavior is normal.       Labs:  Recent Results (from the past 24 hour(s))   Urinalysis w Microscopic and Culture if Indicated    Collection Time: 03/27/18  6:40 PM   Result Value Ref Range    Color, UA Yellow Colorless,Yellow,Straw    Clarity, UA Slightly Cloudy (A) Clear    Specific Gravity, UR 1.024 1.001 - 1.040    pH, Urine 7.0 5.0 - 8.0 pH    Protein, UR Negative Negative mg/dL    Glucose, UA >=161 (A) Negative mg/dL    Ketones UA Negative Negative,5 mg/dL    Bilirubin, UA Negative Negative    Blood, UA Negative Negative    Nitrite, UA Negative Negative    Urobilinogen, UA Normal Normal mg/dL    Leukocyte Esterase, UA Negative Negative Leu/uL    UR Micro Performed     WBC, UA 1 0 - 4 /hpf    RBC, UA 1 0 - 4 /hpf    Squam Epithel, UA 1 0 - 2 /hpf    Amorphous, UA Occasional (A) None,Rare /uL       Imaging:  Xr Chest 2 Views  Result Date: 03/26/2018  No acute cardiopulmonary disease. Pacemaker is unchanged. ReadingStation:[id number]    Ct Angiogram Chest    Result Date: 03/26/2018  IMPRESSION: Pulmonary angio CT showing no evidence of PE. No acute process identified. ReadingStation:[id number]      Medications:  Current Facility-Administered Medications   Medication Dose Route Frequency    aspirin  81 mg Oral Daily    atorvastatin  80 mg Oral QHS    enoxaparin  40 mg Subcutaneous QHS    lisinopril  10 mg Oral Daily    metFORMIN  1,000 mg Oral QAM W/BREAKFAST    metoprolol succinate XL  25 mg Oral QAM         Past Medical History:   Diagnosis Date    Cardiomyopathy     Congestive heart failure     Diabetes mellitus     Hypertension     PVC's (premature ventricular contractions)        Assessment/Plan:   1. Chest pain   2.  Acute on chronic CHF  3.  Cardiomyopathy  4.  Leukocytosis - resolved  5.  Hypertension  6.  Hyperlipidemia    Plan:   1. Continue  to observation unit  2. Serial cardiac enzymes x 3   3. Telemetry monitoring  4. Nitrates PRN  5. Analgesics/Antiemetics PRN  6. Aspirin, BB, and ACE inhibitor daily  7. Repeat ECG PRN  8. Left heart cath on Monday  9. [facility] cardiology was consulted for low risk but abnormal stress test Dr. Raford Pitcher outpatient and will follow.  10. Continue antihypertensive and lipid agents per home dosing  11. 03/27/2018 Echo EF 50%, LV cavity is normal. Mild LVH.  No regional wall motion abnormalities present.  Left ventricle diastolic filling pattern is probable pseudo-normalized, consistent with grade 2 left ventricular diastolic dysfunction elevated left atrial pressure.  Right-sided chambers are not optimally visualized but appear grossly normal.  Pacemaker leads visible within the right heart chamber.  No significant valvular heart disease.  Previous study 10/07/2017 was reviewed.  There is no significant interval change.  12. 03/28/2018 nuclear med stress test low risk.  Completed vasodilator protocol.  No ischemic changes with infusion.  No arrhythmias.  Overall low risk vasodilatory stress test SPECT perfusion study.  There is a large moderate to severe perfusion defect involving the apex, inferior stripe, mid and basal inferior septal and inferior lateral segments with mild reversibility suggesting a possible ischemia but with light Nedra Hai strong component of diaphragm attenuation artifact.  Also anterior septal perfusion defect from attenuation artifact.  No significant findings of ischemia noted.  Dilated left ventricle with mildly decreased left ventricle function, stress ejection fraction 44%.           Conni Elliot, NP

## 2018-03-28 NOTE — Progress Notes (Signed)
NURSE NOTE SUMMARY  [facility] - CLINICAL OBSERVATION UNIT   Patient Name: Linford Arnold   Attending Physician: Harriette Bouillon, MD   Today's date:   03/28/2018 LOS: 0 days   Shift Summary:                                                              [phone] Given report by nurse Arlyss Repress. Nurse reports no complaints of chest pain this shift.Pt in no acute distress. Assumed care of Pt. Pt currently sleeping, Tele monitoring, NSR HR 60. Pt appears to be in no acute distress. Will continue to monitor.   Provider Notifications:      Rapid Response Notifications:  Mobility:      PMP Activity: Step 6 - Walks in Room (03/27/2018  7:07 PM)     Weight tracking:  Family Dynamic:   Last 3 Weights for the past 72 hrs (Last 3 readings):   Weight   03/26/18 1601 137.3 kg (302 lb 11.1 oz)             Recent Vitals Last Bowel Movement   BP: 145/84 (03/27/2018 11:16 PM)  Heart Rate: 63 (03/27/2018 11:16 PM)  Temp: 97.9 F (36.6 C) (03/27/2018 11:16 PM)  Resp Rate: 18 (03/27/2018 11:16 PM)  SpO2: 98 % (03/27/2018 11:16 PM)   Last BM Date: 03/26/18

## 2018-03-28 NOTE — Plan of Care (Signed)
Problem: Chest Pain  Goal: Vital signs and cardiac rhythm stable  Description  Interventions:  1. Monitor /assess vital signs/cardiac rhythms  2. Monitor labs   3. Assess the need for oxygen therapy and administer as ordered  Outcome: Progressing  Goal: Cardiac pain management  Description  Interventions:  1. Assess/report chest pain/or related discomfort to LIP immediately  2. Instruct patient to report any change in pain status  3. Assess pain/or related discomfort on admission, during daily assessment, before and after any intervention  4. Include patient and patient care companion in decisions related to pain management  Outcome: Progressing  Goal: Patient/Patient Care Companion demonstrates understanding of disease process, treatment plan, medications, and discharge plan  Description  Interventions:  1. Educate patient to immediately report any chest pain/equivalent to RN  2. Reinforce with patient their activity level and to avoid Valsalva  3. Reinforce regarding cardiac diet and any fluid parameters  4. Nutrition consult as needed  5. Assist patient/patient care companion to identify measures for cardiac risk factor management  6. Consult/collaborate with Cardiac Rehabilitation  7. Assess need for smoking cessation and substance abuse counseling and refer as needed  8. Educate patient/patient care companion regarding identified learning needs  9. Provide appropriate resources and referrals  10. Plan for discharge  Outcome: Progressing     Problem: Moderate/High Fall Risk Score >5  Goal: Patient will remain free of falls  Outcome: Progressing

## 2018-03-28 NOTE — Progress Notes (Signed)
NM cardiac stress test completed. Lexiscan 0.4mg IV given over 20 seconds. + sob after Lexiscan injection. Po caffeine given. Improved in recovery. Tolerated well.    Thom Ollinger, RN, RRT  Nuclear Card Stress Lab RN  [phone]

## 2018-03-28 NOTE — Progress Notes (Signed)
Patient rang out, stated 4/10 CP radiating to left arm to elbow; VSS; admin. x1 NTG SL

## 2018-03-29 LAB — VH DEXTROSE STICK GLUCOSE: Glucose POCT: 229 mg/dL — ABNORMAL HIGH (ref 71–99)

## 2018-03-29 MED ORDER — VH BIO-K PLUS PROBIOTIC 50 BIL CFU CAPSULE
50.00 | DELAYED_RELEASE_CAPSULE | Freq: Every day | ORAL | Status: DC
Start: 2018-03-29 — End: 2018-03-30
  Administered 2018-03-29 – 2018-03-30 (×2): 50 via ORAL
  Filled 2018-03-29 (×2): qty 1

## 2018-03-29 MED ORDER — CEPHALEXIN 500 MG PO CAPS
500.00 mg | ORAL_CAPSULE | Freq: Four times a day (QID) | ORAL | Status: DC
Start: 2018-03-29 — End: 2018-03-30
  Administered 2018-03-29 – 2018-03-30 (×3): 500 mg via ORAL
  Filled 2018-03-29 (×6): qty 1

## 2018-03-29 MED ORDER — GLUCAGON 1 MG IJ SOLR (WRAP)
1.00 mg | INTRAMUSCULAR | Status: DC | PRN
Start: 2018-03-29 — End: 2018-03-30

## 2018-03-29 MED ORDER — INSULIN LISPRO (1 UNIT DIAL) 100 UNIT/ML SC SOPN
1.00 [IU] | PEN_INJECTOR | Freq: Every evening | SUBCUTANEOUS | Status: DC
Start: 2018-03-29 — End: 2018-03-30
  Administered 2018-03-29: 22:00:00 1 [IU] via SUBCUTANEOUS
  Filled 2018-03-29: qty 3

## 2018-03-29 MED ORDER — INSULIN LISPRO (1 UNIT DIAL) 100 UNIT/ML SC SOPN
1.00 [IU] | PEN_INJECTOR | Freq: Three times a day (TID) | SUBCUTANEOUS | Status: DC
Start: 2018-03-30 — End: 2018-03-30
  Administered 2018-03-30 (×3): 1 [IU] via SUBCUTANEOUS

## 2018-03-29 MED ORDER — DEXTROSE 10 % IV BOLUS
125.00 mL | INTRAVENOUS | Status: DC | PRN
Start: 2018-03-29 — End: 2018-03-30

## 2018-03-29 MED ORDER — INSULIN LISPRO (1 UNIT DIAL) 100 UNIT/ML SC SOPN
1.00 [IU] | PEN_INJECTOR | Freq: Every day | SUBCUTANEOUS | Status: DC | PRN
Start: 2018-03-29 — End: 2018-03-30

## 2018-03-29 NOTE — Plan of Care (Signed)
Problem: Chest Pain  Goal: Vital signs and cardiac rhythm stable  Description  Interventions:  1. Monitor /assess vital signs/cardiac rhythms  2. Monitor labs   3. Assess the need for oxygen therapy and administer as ordered  Outcome: Progressing  Goal: Cardiac pain management  Description  Interventions:  1. Assess/report chest pain/or related discomfort to LIP immediately  2. Instruct patient to report any change in pain status  3. Assess pain/or related discomfort on admission, during daily assessment, before and after any intervention  4. Include patient and patient care companion in decisions related to pain management  Outcome: Progressing  Goal: Patient/Patient Care Companion demonstrates understanding of disease process, treatment plan, medications, and discharge plan  Description  Interventions:  1. Educate patient to immediately report any chest pain/equivalent to RN  2. Reinforce with patient their activity level and to avoid Valsalva  3. Reinforce regarding cardiac diet and any fluid parameters  4. Nutrition consult as needed  5. Assist patient/patient care companion to identify measures for cardiac risk factor management  6. Consult/collaborate with Cardiac Rehabilitation  7. Assess need for smoking cessation and substance abuse counseling and refer as needed  8. Educate patient/patient care companion regarding identified learning needs  9. Provide appropriate resources and referrals  10. Plan for discharge  Outcome: Progressing     Problem: Moderate/High Fall Risk Score >5  Goal: Patient will remain free of falls  Outcome: Progressing

## 2018-03-29 NOTE — Plan of Care (Signed)
Problem: Chest Pain  Goal: Vital signs and cardiac rhythm stable  Outcome: Progressing  Flowsheets (Taken 03/29/2018 2322)  Vital signs and cardiac rhythm stable: Monitor /assess vital signs/cardiac rhythms; Monitor labs; Assess the need for oxygen therapy and administer as ordered  Goal: Cardiac pain management  Outcome: Progressing  Flowsheets (Taken 03/29/2018 2322)  Cardiac pain management : Assess/report chest pain/or related discomfort to LIP immediately; Instruct patient to report any change in pain status; Assess pain/or related discomfort on admission, during daily assessment, before and after any intervention; Include patient and patient care companion in decisions related to pain management  Goal: Patient/Patient Care Companion demonstrates understanding of disease process, treatment plan, medications, and discharge plan  Outcome: Progressing  Flowsheets (Taken 03/29/2018 2322)  Patient/Patient Care Companion demonstrates understanding of disease process, treatment plan, medications and discharge plan     : Educate patient to immediately report any chest pain/equivalent to RN; Reinforce with patient their activity level and to avoid Valsalva; Reinforce regarding cardiac diet and any fluid parameters; Nutrition consult as needed; Assist patient/patient care companion to identify measures for cardiac risk factor management; Consult/collaborate with Cardiac Rehabilitation; Assess need for smoking cessation and substance abuse counseling and refer as needed; Educate patient/patient care companion regarding identified learning needs; Provide appropriate resources and referrals; Plan for discharge     Problem: Moderate/High Fall Risk Score >5  Goal: Patient will remain free of falls  Outcome: Progressing  Flowsheets (Taken 03/29/2018 2300)  VH Moderate Risk (6-13): ALL REQUIRED LOW INTERVENTIONS

## 2018-03-29 NOTE — Progress Notes (Signed)
OBSERVATION UNIT  PROGRESS NOTE    Patient Name: Samuel Koch, Samuel Koch  Time: 03/29/18 8:45 AM    Subjective:   Samuel Koch is a 48 y.o. male who presented with chest pain for past week, worse today.  Patient has history of cardiomyopathy, status post PPM and CHF.  He was seen at the heart failure clinic yesterday by Jacques Earthly, NP.  He did a 40-day fast in November and December.  He admittedly stopped taking all of his medication during this time.   And was complaining yesterday that he had "gained 10 pounds".  He has been experiencing extreme fatigue, worse with minimal exertion.  He has noticed intermittent chest pain which became worse today just after 1 PM.  Described as substernal pain with radiation to left arm.  Some associated nausea.  Denies shortness of breath or diaphoresis.  He has had a productive cough.  Denies fever, chills,vomiting, diarrhea, body aches, trouble swallowing, or heartburn.  He did travel to [place] in November.    Workup in the ED includes CBC with WBC 14.  BUN/creatinine: 27/1.3.  Chest x-ray is negative.  ECG,  and initial Trop-I has been unremarkable.  The patient has agreed to transfer to the Observation Unit for completion of cardiac workup with Stress Testing in the morning.      03/27/18  48 year old male presented yesterday to the emergency department with chest pain and 1 week of weakness fatigue and low energy.  He has been spiking fevers overnight to a T-max of 101.7.  No identifiable source of infection.  His echocardiogram showed an ejection fraction of 50 and no interval change.  Enzymes are negative BNP is normal.  His chronic systolic diastolic congestive heart failure seems to be stable.  He was just seen in heart failure clinic a few days ago.  We will keep the patient tonight for observation and do a nuclear stress test in the morning.  If that is negative he can be discharged home.    03/28/18  Patient up pacing halls went in to discuss NM stress test  results with patient and wife. Patient complaining of 4/10 left sided chest pain radiating in to his left arm with dizziness. He denies any palpitations or shortness of breath.  Patient's EKG at this time had no changes.  By the time nitro was retrieved patient laid down and chest pain went away.  Then reoccurred later this afternoon while walking around again with exertion chest pain radiating to his left arm.  Vital signs are stable.  1 sublingual nitroglycerin was given and pain went from a 4/10 to a 1/10.    03/29/18  Patient feels well this morning, denies chest pain.  He will stay until tomorrow for left heart catheterization    Objective:     Vitals:    03/29/18 0805   BP: 130/87   Pulse: 62   Resp:    Temp:    SpO2:        Physical Exam   Constitutional: He is oriented to person, place, and time. He appears well-developed and well-nourished.   HENT:   Head: Normocephalic and atraumatic.   Mouth/Throat: Oropharynx is clear and moist.   Eyes: Pupils are equal, round, and reactive to light.   Neck: Normal range of motion. Neck supple. No JVD present.   Cardiovascular: Normal rate, regular rhythm, normal heart sounds and intact distal pulses. Exam reveals no gallop and no friction rub.   No murmur heard.  Pulmonary/Chest: Effort normal and breath sounds normal. No respiratory distress. He has no wheezes. He has no rales. He exhibits no tenderness.   Abdominal: Soft. Bowel sounds are normal. He exhibits no distension. There is no abdominal tenderness.   Musculoskeletal: Normal range of motion.         General: No tenderness or edema.   Neurological: He is alert and oriented to person, place, and time.   Skin: Skin is warm and dry. No rash noted. No erythema. No pallor.   Psychiatric: He has a normal mood and affect. His behavior is normal.   Vitals reviewed.        Labs:  No results found for this or any previous visit (from the past 24 hour(s)).    Imaging:  Xr Chest 2 Views    Result Date: 03/26/2018  No acute  cardiopulmonary disease. Pacemaker is unchanged. ReadingStation:[id number]    Ct Angiogram Chest    Result Date: 03/26/2018  IMPRESSION: Pulmonary angio CT showing no evidence of PE. No acute process identified. ReadingStation:[id number]      Medications:  Current Facility-Administered Medications   Medication Dose Route Frequency    aspirin  81 mg Oral Daily    atorvastatin  80 mg Oral QHS    enoxaparin  40 mg Subcutaneous QHS    lisinopril  10 mg Oral Daily    metFORMIN  1,000 mg Oral QAM W/BREAKFAST    metoprolol succinate XL  25 mg Oral QAM    nitroglycerin  1 inch Topical Q6H HOLD MN       Assessment:   1.  Chest pain: Nuclear stress test 03/28/2018    Completed vasodilator protocol.     No ischemic changes with infusion    No arrhythmias.        Overall, low risk vasodilator stress rest SPECT perfusion study.    There is a large moderate to severe perfusion defect involving the apex, inferior stripe, mid and basal inferoseptal and     inferolateral segments with mild reversibility suggestive of possible ischemia but with likely strong component of diaphragmatic     attenuation artifact.  Also anteroseptal perfusion defect from attenuation artifact.    No significant findings of ischemia  noted.    Dilated left ventricle with mildly decreased left ventricular function, stress ejection fraction 44%.     2.  Hypertension  3.  Diabetes  4.  Hyperlipidemia  5.  Nonischemic cardiomyopathy      Past Medical History:   Diagnosis Date    Cardiomyopathy     Congestive heart failure     Diabetes mellitus     Hypertension     PVC's (premature ventricular contractions)        Plan:   1.  Continue telemetry monitoring  2.  Aspirin daily  3.  Nitrates PRN  4.  Analgesics  5.  Antiemetics  6.  Repeat ECG PRN  7. ACEI, BB, topical nitrates  8.  Lovenox: DVT prophylaxis  9.  Cardiology following  10. LHC in AM  11. NPO after midnight      Rolin Barry, NP

## 2018-03-30 ENCOUNTER — Encounter
Admission: EM | Disposition: A | Discharge status: Home / Self Care | Payer: Self-pay | Source: Home / Self Care | Attending: Emergency Medicine

## 2018-03-30 ENCOUNTER — Encounter: Payer: Self-pay | Admitting: Cardiovascular Disease

## 2018-03-30 LAB — ECG 12-LEAD
P Wave Axis: 57 deg
P Wave Axis: 49 deg
P-R Interval: 178 ms
P-R Interval: 155 ms
Patient Age: 47 years
Patient Age: 47 years
Q-T Interval(Corrected): 462 ms
Q-T Interval(Corrected): 407 ms
Q-T Interval: 444 ms
Q-T Interval: 404 ms
QRS Axis: 39 deg
QRS Axis: 39 deg
QRS Duration: 91 ms
QRS Duration: 86 ms
T Axis: 47 years
T Axis: 30 years
Ventricular Rate: 65 //min
Ventricular Rate: 61 //min

## 2018-03-30 LAB — VH DEXTROSE STICK GLUCOSE
Glucose POCT: 187 mg/dL — ABNORMAL HIGH (ref 71–99)
Glucose POCT: 175 mg/dL — ABNORMAL HIGH (ref 71–99)
Glucose POCT: 179 mg/dL — ABNORMAL HIGH (ref 71–99)
Glucose POCT: 174 mg/dL — ABNORMAL HIGH (ref 71–99)

## 2018-03-30 SURGERY — RIGHT & LEFT HEART CATH  W/ CORONARY ANGIOS, LV/LA
Anesthesia: IV Sedation | Laterality: Right

## 2018-03-30 MED ORDER — IODIXANOL 320 MG/ML IV SOLN
25.00 mL | Freq: Once | INTRAVENOUS | Status: AC
Start: 2018-03-30 — End: 2018-03-30
  Administered 2018-03-30: 25 mL via INTRA_ARTERIAL

## 2018-03-30 MED ORDER — INSULIN LISPRO (1 UNIT DIAL) 100 UNIT/ML SC SOPN
8.00 [IU] | PEN_INJECTOR | Freq: Three times a day (TID) | SUBCUTANEOUS | 1 refills | Status: DC
Start: 2018-03-30 — End: 2018-12-09

## 2018-03-30 MED ORDER — VH HEPARIN 10,000 UNITS/1000 ML NS
INJECTION | INTRAVENOUS | Status: AC
Start: 2018-03-30 — End: 2018-03-30
  Filled 2018-03-30: qty 1000

## 2018-03-30 MED ORDER — PEN NEEDLES 3/16" 31G X 5 MM MISC
1.00 | Freq: Four times a day (QID) | 1 refills | Status: DC
Start: 2018-03-30 — End: 2020-05-15

## 2018-03-30 MED ORDER — INSULIN GLARGINE 100 UNIT/ML SC SOPN
25.00 [IU] | PEN_INJECTOR | Freq: Every evening | SUBCUTANEOUS | 1 refills | Status: DC
Start: 2018-03-30 — End: 2018-04-06

## 2018-03-30 MED ORDER — HEPARIN (PORCINE) IN NACL 2-0.9 UNIT/ML-% IJ SOLN (WRAP)
INTRAVENOUS | Status: AC
Start: 2018-03-30 — End: 2018-03-30
  Filled 2018-03-30: qty 500

## 2018-03-30 MED ORDER — LIDOCAINE HCL (PF) 1.5 % IJ SOLN
INTRAMUSCULAR | Status: AC
Start: 2018-03-30 — End: 2018-03-30
  Filled 2018-03-30: qty 10

## 2018-03-30 MED ORDER — VERAPAMIL HCL 2.5 MG/ML IV SOLN
INTRAVENOUS | Status: AC
Start: 2018-03-30 — End: 2018-03-30
  Administered 2018-03-30: 14:00:00 10 mg via INTRA_ARTERIAL
  Filled 2018-03-30: qty 4

## 2018-03-30 MED ORDER — ATORVASTATIN CALCIUM 20 MG PO TABS
20.0000 mg | ORAL_TABLET | Freq: Every evening | ORAL | 2 refills | Status: DC
Start: 2018-03-30 — End: 2018-09-17

## 2018-03-30 NOTE — Plan of Care (Addendum)
Problem: Chest Pain  Goal: Vital signs and cardiac rhythm stable  Outcome: Progressing     NURSE NOTE SUMMARY  [facility] - CLINICAL OBSERVATION UNIT   Patient Name: Samuel Koch   Attending Physician: Harriette Bouillon, MD   Today's date:   03/30/2018 LOS: 0 days   Shift Summary:                                                              Report received from joanna, rn. Pt in nad, vss. No complaints. Family at bedside. Updated on poc. 1305- pt off unit for cath. 1403- pt returned to unit from cath lab. R radial tr band c/d/i, 2+ r radial pulse, family at bedside, pt given post cath instructions. 1515- tr band removed without difficulty, 2+ r radial pulse, vss, family at bedside, pt with no complaints. 1728- [place] order received. [place] instructions and medications reviewed with pt, all questions answered, pt verbalized understanding, iv and monitor removed, pt ambulatory for [place].    Provider Notifications:      Rapid Response Notifications:  Mobility:      PMP Activity: Step 7 - Walks out of Room (03/30/2018  8:00 AM)     Weight tracking:  Family Dynamic:   No data found.            Recent Vitals Last Bowel Movement   BP: 152/84 (03/30/2018  7:15 AM)  Heart Rate: 61 (03/30/2018  7:15 AM)  Temp: 97.5 F (36.4 C) (03/30/2018  7:15 AM)  Resp Rate: 22 (03/30/2018  7:15 AM)  SpO2: 97 % (03/30/2018  7:15 AM)   Last BM Date: 03/28/18

## 2018-03-30 NOTE — UM Notes (Signed)
[facility] Utilization Management Review Sheet    Facility :  [facility]    NAME: Samuel Koch  MR#: [id number]    CSN#: [id number]    ROOM: 2506/2506-A AGE: 48 y.o.    ADMIT DATE AND TIME: 03/26/2018  4:05 PM      PATIENT CLASS:  03/26/18 1916  Place (admit) for Observation Services (Admission Orders            ATTENDING PHYSICIAN: Harriette Bouillon, MD  PAYOR:Payor: Cherlyn Labella / Plan: [facility] EXCHANGE / Product Type: BCBS /       AUTH #:     DIAGNOSIS:     ICD-10-CM    1. Chest pain R07.9 Cardiac Cath Case Request     Cardiac Cath Case Request     Right & Left Heart Cath  w/ Coronary Angios, LV/LA     Right & Left Heart Cath  w/ Coronary Angios, LV/LA       HISTORY:   Past Medical History:   Diagnosis Date    Cardiomyopathy     Congestive heart failure     Diabetes mellitus     Hypertension     PVC's (premature ventricular contractions)      2/22/  Cardiology consult     Chest pain      Assessment / Plan   Exertional angina, fairly classic presentation. Also profound fatigue, DOE. Stress test shows severe diaphragm attenuation artifact which limits interpretability, but symptoms are classic enough that I think a heart catheterization is reasonable. If no culprit lesion is seen then would also perform RHC.    NP note   Patient up pacing halls went in to discuss NM stress test results with patient and wife. Patient complaining of 4/10 left sided chest pain radiating in to his left arm with dizziness. He denies any palpitations or shortness of breath.  Patient's EKG at this time had no changes.  By the time nitro was retrieved patient laid down and chest pain went away.  Then reoccurred later this afternoon while walking around again with exertion chest pain radiating to his left arm.  Vital signs are stable.  1 sublingual nitroglycerin was given and pain went from a 4/10 to a 1/10.    Plan:   1. Continue to observation unit  2. Serial cardiac enzymes x 3   3. Telemetry monitoring  4.  Nitrates PRN  5. Analgesics/Antiemetics PRN  6. Aspirin, BB, and ACE inhibitor daily  7. Repeat ECG PRN  8. Left heart cath on Monday  9. [facility] cardiology was consulted for low risk but abnormal stress test Dr. Raford Pitcher outpatient and will follow.  10. Continue antihypertensive and lipid agents per home dosing  11. 03/27/2018 Echo EF 50%, LV cavity is normal. Mild LVH.  No regional wall motion abnormalities present.  Left ventricle diastolic filling pattern is probable pseudo-normalized, consistent with grade 2 left ventricular diastolic dysfunction elevated left atrial pressure.  Right-sided chambers are not optimally visualized but appear grossly normal.  Pacemaker leads visible within the right heart chamber.  No significant valvular heart disease.  Previous study 10/07/2017 was reviewed.  There is no significant interval change.  12. 03/28/2018 nuclear med stress test low risk.  Completed vasodilator protocol.  No ischemic changes with infusion.  No arrhythmias.  Overall low risk vasodilatory stress test SPECT perfusion study.  There is a large moderate to severe perfusion defect involving the apex, inferior stripe, mid and basal inferior septal and inferior lateral  segments with mild reversibility suggesting a possible ischemia but with light Nedra Hai strong component of diaphragm attenuation artifact.  Also anterior septal perfusion defect from attenuation artifact.  No significant findings of ischemia noted.  Dilated left ventricle with mildly decreased left ventricle function, stress ejection fraction 44%.           03/29/18  Patient feels well this morning, denies chest pain.  He will stay until tomorrow for left heart catheterization    Plan:   1.  Continue telemetry monitoring  2.  Aspirin daily  3.  Nitrates PRN  4.  Analgesics  5.  Antiemetics  6.  Repeat ECG PRN  7. ACEI, BB, topical nitrates  8.  Lovenox: DVT prophylaxis  9.  Cardiology following  10. LHC in AM  11. NPO after midnight      DATE OF REVIEW:  03/30/2018    VITALS: BP 152/84    Pulse 61    Temp 97.5 F (36.4 C) (Oral)    Resp 22    Ht 1.88 m (6\' 2" )    Wt 137.3 kg (302 lb 11.1 oz)    SpO2 97%    BMI 38.86 kg/m     Active Hospital Problems    Diagnosis    Chest pain

## 2018-03-30 NOTE — Discharge Summary (Signed)
[facility] OBSERVATION UNIT      Patient: Samuel Koch  Admission Date: 03/26/2018   DOB: Jan 26, 1971  Discharge Date: 03/30/2018    MRN: [id number]  Discharge Attending: Harriette Bouillon, MD   Referring Physician: Patsy Lager, MD  PCP: Patsy Lager, MD       DISCHARGE SUMMARY     Discharge Information   Admission Diagnosis:    1.Chest pain: Nuclear stress test 03/28/2018  Completed vasodilator protocol.   No ischemic changes with infusion   No arrhythmias.     Overall, low risk vasodilator stress rest SPECT perfusion study.   There is a large moderate to severe perfusion defect involving the apex, inferior stripe, mid and basal inferoseptal and   inferolateral segments with mild reversibility suggestive of possible ischemia but with likely strong component of diaphragmatic   attenuation artifact. Also anteroseptal perfusion defect from attenuation artifact.   No significant findings of ischemia noted.   Dilated left ventricle with mildly decreased left ventricular function, stress ejection fraction 44%.      Left/right heart catheterization 03/30/2018  ANGIOGRAPHIC DATA:   Left Main: Normal appearing vessel shows no angiographic disease   LAD: Normal appearing vessel shows no angiographic evidence of disease   Left Circumflex: Normal   RCA: Dominant vessel, minor luminal irregularities   Left ventriculography: LV gram was not performed as the patient recently underwent nuclear stress test which shows near normal LV systolic function.  RECOMMENDATIONS:    1. Continued risk factor modification.  2.  False positive nuclear stress test      2. Hypertension  3. Diabetes  4. Hyperlipidemia  5. Nonischemic cardiomyopathy    Discharge Diagnosis:   Patient Active Problem List    Diagnosis Date Noted    Chest pain 03/26/2018    NICM (nonischemic cardiomyopathy) 10/20/2017    Chronic combined systolic and diastolic HF (heart failure) 10/20/2017    Obesity, morbid, BMI 40.0-49.9  10/07/2017    DOE (dyspnea on exertion) 10/06/2017    Hyperglycemia 03/29/2017    Essential hypertension 03/29/2017    Combined systolic and diastolic congestive heart failure 03/29/2017    Dehydration, moderate 03/29/2017    Anemia, unspecified type 03/29/2017    Renal insufficiency 03/29/2017    Near syncope 03/28/2017        Discharge Medications:     Medication List      START taking these medications    atorvastatin 20 MG tablet  Commonly known as:  LIPITOR  Take 1 tablet (20 mg total) by mouth nightly        CONTINUE taking these medications    furosemide 40 MG tablet  Commonly known as:  LASIX  Take 1 tablet (40 mg total) by mouth as needed (weight gain)     insulin glargine 100 UNIT/ML injection pen  Inject 25 Units into the skin nightly     insulin lispro 100 UNIT/ML injection pen  Commonly known as:  HumaLOG  Inject 8 Units into the skin 3 (three) times daily before meals     lisinopril 10 MG tablet  Commonly known as:  PRINIVIL,ZESTRIL  Take 1 tablet (10 mg total) by mouth daily     metFORMIN 500 MG tablet  Commonly known as:  GLUCOPHAGE  Take 2 tablets (1,000 mg total) by mouth every morning with breakfast     metoprolol succinate XL 25 MG 24 hr tablet  Commonly known as:  TOPROL-XL  Where to Get Your Medications      These medications were sent to [facility] [address] ([address]), [place] - [address]  [address], [place]) [place] [address]    Phone:  [phone]    atorvastatin 20 MG tablet             Hospital Course   Presentation History   Jade Burright Doomyis a 48 y.o.malewho presented with chest pain for past week, worse today. Patient has history of cardiomyopathy, status post PPM and CHF. He was seen at the heart failure clinic yesterday by Jacques Earthly, NP. He did a 40-day fast in November and December.He admittedly stopped taking all of his medication during this time. And was complaining yesterday that he had "gained 10 pounds". He has been  experiencing extreme fatigue, worse with minimal exertion. He has noticed intermittent chest pain which became worse today just after 1 PM. Described as substernal pain with radiation to left arm. Some associated nausea. Denies shortness of breath or diaphoresis. He has had a productive cough. Denies fever, chills,vomiting, diarrhea, body aches, trouble swallowing, or heartburn.He did travel to [place] in November.    Workup in the ED includesCBC with WBC 14. BUN/creatinine: 27/1.3. Chest x-ray is negative. ECG, and initial Trop-I has been unremarkable. The patient has agreed to transfer to the Observation Unit for completion of cardiac workup with Stress Testing in the morning.     03/27/18  48 year old male presented yesterday to the emergency department with chest pain and 1 week of weakness fatigue and low energy. He has been spiking fevers overnight to a T-max of 101.7. No identifiable source of infection. His echocardiogram showed an ejection fraction of 50 and no interval change. Enzymes are negative BNP is normal. His chronic systolic diastolic congestive heart failure seems to be stable. He was just seen in heart failure clinic a few days ago. We will keep the patient tonight for observation and do a nuclear stress test in the morning. If that is negative he can be discharged home.    03/28/18  Patient up pacing halls went in to discuss NM stress test results with patient and wife. Patient complaining of 4/10 left sided chest pain radiating in to his left arm with dizziness. He denies any palpitationsor shortness of breath. Patient's EKG at this time had no changes. By the time nitro was retrieved patient laid down and chest pain went away. Then reoccurred later this afternoon while walking around again with exertion chest pain radiating to his left arm. Vital signs are stable. 1 sublingual nitroglycerin was given and pain went from a 4/10 to a 1/10.    03/29/18  Patient feels  well this morning, denies chest pain. He will stay until tomorrow for left heart catheterization    03/30/18  Patient resting in bed with his family.  He has been NPO since midnight.  He denies chest pain.    Hospital Course (0 Days)   Patient was transferred to the observation unit for ongoing cardiac work-up to include telemetry monitoring serial cardiac enzymes and nuclear stress testing.  Enzymes remain negative.  He had an intermediate risk stress test.  He was seen by cardiology, left and right heart catheterization was recommended.  He was taken to the Cath Lab today, he had normal coronary arteries and normal right heart pressures.  He returned to the observation unit he has a right radial and brachial access site which has remained clean dry and intact.  Extensive discussion was undertaken with the patient and his wife and observation attending in terms of his symptoms going forward.  Patient was advised to continue to monitor his symptoms and return with any new or worsening symptoms.  He has an appointment with a new PCP however this is not until May.  He will keep this appointment.  We will contact the office tomorrow to attempt to move his appointment up sooner.  He will keep his appointment with his cardiologist.  Patient was advised to return to the emergency department with any new or worsening symptoms.    Procedures/Imaging:   Right & Left Heart Cath  w/ Coronary Angios, LV/LA   Final Result      NM Myocardial Perfusion Spect (Stress And Rest)   Final Result      Echocardiogram Adult Complete W Clr/ Dopp Waveform   Final Result      CT Angiogram Chest   Final Result   IMPRESSION:    Pulmonary angio CT showing no evidence of PE.   No acute process identified.       ReadingStation:[id number]      XR Chest 2 Views   Final Result   No acute cardiopulmonary disease.   Pacemaker is unchanged.      ReadingStation:[id number]      CV Cardiac Stress Test Tracing Only    (Results Pending)       Treatment Team:    Attending Provider: Harriette Bouillon, MD  Consulting Physician: Abel Presto, DO       Progress Note/Physical Exam at Discharge   Vitals:    03/30/18 1545 03/30/18 1600 03/30/18 1630 03/30/18 1700   BP: 129/87 (!) 136/91 142/79 145/83   Pulse: 62  61 61   Resp:       Temp:       TempSrc:       SpO2:       Weight:       Height:            Diagnostics     Stress Test Results: See above left heart cath and stress test reports    Labs/Studies Pending at Discharge: No    Last Labs   Recent Labs   Lab 03/27/18  1300 03/26/18  1624   WBC 6.3 14.2*   RBC 4.47 5.01   Hemoglobin 13.7 15.2   Hematocrit 41.0 45.6   MCV 92 91   PLT CT 210 264       Recent Labs   Lab 03/27/18  1300 03/26/18  1624 03/25/18  1029   Sodium 136 136  --    Potassium 4.1 4.9  --    Chloride 103 101  --    CO2 28 26  --    BUN 20 27*  --    Creatinine 0.95 1.27  --    i-STAT Creatinine  --   --  0.80   Glucose 165* 274*  --    Calcium 8.5 9.3  --         Patient Instructions   Discharge Diet: regular diet and diabetic diet  Discharge Activity:  activity as tolerated    Follow Up Appointment:       Time spent examining patient, discussing with patient/family regarding hospital course, chart review, reconciling medications and discharge planning: 30 minutes.      Rolin Barry, NP    5:21 PM 03/30/2018

## 2018-03-30 NOTE — Discharge Instructions (Signed)
DISCHARGE INSTRUCTIONS - RADIAL CATHETERIZATION     Activity    Rest and limit the use of the affected arm for the first 24 hours.    No driving for 24 hours.    You may take a shower starting the day after your procedure.    Do NOT get wrist heavily dirty for 3 days. No gardening, house cleaning, etc for 3 days.    Do NOT lift anything greater than 10 pounds for 7 days.    Do NOT soak your wrist for the next 7 days. No tub bath, no dishwashing, no swimming.    Care for the Procedure Site    Observe the procedure site daily.    Remove the dressing the day after the procedure.    Keep site clean, dry and covered with a band-aid for 3 days. Change band-aid as needed.    Slight tenderness or soreness at the procedure site, and/or tingling of the fingers/hand of the same side as procedure site may occur for up to 3 days after the procedure. If these symptoms continue, notify your physician.    If there is uncontrolled bleeding at procedure site or rapid swelling at procedure site, apply pressure to the site. Call 911 immediately.         Notify Your Doctor to Report Any of the Following    Fever 101 or higher.    Swelling at the procedure site.    Redness at the procedure site.    Drainage at the procedure site.    Increased discomfort at procedure site.    New/increased numbness in your hand or same side of procedure site.   Shortness of Breath (Dyspnea)  Shortness of breath is the feeling that you can't catch your breath or get enough air. It is also known as dyspnea.  Dyspnea can be caused by many different conditions. They include:   Acute asthma attack   Worsening of chronic lung diseases such as chronic bronchitis and emphysema   Heart failure. This is when weak heart muscle allows extra fluid to collect in the lungs.   Panic attacks or anxiety. Fear can cause rapid breathing (hyperventilation).   Pneumonia, or an infection in the lung tissue   Exposure to toxic substances, fumes, smoke, or certain  medicines   Blood clot in the lung (pulmonary embolism). This is often from a piece of blood clot in a deep vein of the leg (deep vein thrombosis) that breaks off and travels to the lungs.   Heart attack or heart-related chest pain (angina)   Anemia   Collapsed lung (pneumothorax)   Dehydration   Pregnancy  Based on your visit today, the exact cause of your shortness of breath is not certain. Your tests dont show any of the serious causes of dyspnea. You may need other tests to find out if you have a serious problem. Its important to watch for any new symptoms or symptoms that get worse. Follow up with your healthcare provider as directed.  Home care  Follow these tips to take care of yourself at home:   When your symptoms are better, go back to your usual activities.   If you smoke, you should stop. Join a quit-smoking program or ask your healthcare provider for help.   Eat a healthy diet and get plenty of sleep.   Get regular exercise. Talk with your healthcare provider before starting to exercise, especially if you have other medical problems.   Cut down on the  amount of caffeine and stimulants you consume.  Follow-up care  Follow up with your healthcare provider, or as advised.  If tests were done, you will be told if your treatment needs to be changed. You can call as directed for the results.  If an X-ray was taken, a specialist will review it. You will be notified of any new findings that may affect your care.  Call 911  Shortness of breath may be a sign of a serious medical problem. For example, it may be a problem with your heart or lungs. Call 911 if you have worsening shortness of breath or trouble breathing, especially with any of the symptoms below:   Confusion or difficulty waking   Fainting or loss of consciousness.   Fast or irregular heartbeat   Coughing up blood   Pain in your chest, arm, shoulder, neck, or upper back   Sweating  When to seek medical advice  Call your healthcare  provider right away if any of these occur:   Slight shortness of breath or wheezing   Redness, pain or swelling in your leg, arm, or other body area   Swelling in both legs or ankles   Fast weight gain   Dizziness or weakness   Fever of 100.22F (38C) or higher, or as directed by your healthcare provider  [employer] last reviewed this educational content on 07/05/2016   2000-2019 [employer], [place]. [address], [place], [place] [address]. All rights reserved. This information is not intended as a substitute for professional medical care. Always follow your healthcare professional's instructions.        Noncardiac Chest Pain    Based on your visit today, the healthcare provider doesnt know what is causing your chest pain. In most cases, people who come to the emergency room with chest pain dont have a problem with their heart. Instead, the pain is caused by other conditions. It's important for the healthcare team to be sure you are not having a life-threatening cause for chest pain such as:    Heart attack   Blood clot in the lungs   Collapsed lung   Ruptured esophagus   Tearing of the aorta  Once these major causes have been ruled out, you may have further evaluation for nonheart causes of chest pain. These may be problems with the lungs, muscles, bones, digestive tract, nerves, or mental health. They include:    Inflammation around the lungs (pleurisy)   Collapsed lung (pneumothorax)   Fluid around the lungs (pleural effusion)   Lung cancer (a rare cause of chest pain)   Inflamed cartilage between the ribs (costochondritis)   Fibromyalgia   Rheumatoid arthritis   Chest wall strain   Reflux   Stomach ulcer   Spasms of the esophagus   Gall stones   Gallbladder inflammation   Panic or anxiety attacks   Emotional distress  Your condition doesnt seem serious. And your pain doesnt seem to be coming from your heart. But sometimes the signs of a serious problem take more time to appear.  Watch for the warning signs listed below.   Home care  Follow these guidelines when caring for yourself at home:    Rest today and don't do any strenuous activity.   Take any prescribed medicine as directed.  Follow-up care  Follow up with your healthcare provider, or as advised, if you dont start to feel better in 24 hours.   Call 911  Call 911 if any of these  occur:    A change in the type of pain: if it feels different, becomes more severe, lasts longer, or begins to spread into your shoulder, arm, neck, jaw or back   Shortness of breath or increased pain with breathing   Weakness, dizziness, or fainting   Rapid heart beat   Crushing sensation in your chest  When to seek medical advice  Call your healthcare provider right away if any of these occur:    Cough with dark colored sputum (phlegm) or blood   Fever of 100.63F (38C) or higher, or as directed by your healthcare provider   Swelling, pain or redness in one leg  [employer] last reviewed this educational content on 07/05/2017   2000-2019 [employer], [place]. [address] [place], [place] [address]. All rights reserved. This information is not intended as a substitute for professional medical care. Always follow your healthcare professional's instructions.

## 2018-03-30 NOTE — Progress Notes (Signed)
OBSERVATION UNIT  PROGRESS NOTE    Patient Name: Samuel Koch, Samuel Koch  Time: 03/30/18 10:08 AM    Subjective:   Samuel Koch a 48 y.o.malewho presented with chest pain for past week, worse today. Patient has history of cardiomyopathy, status post PPM and CHF. He was seen at the heart failure clinic yesterday by Jacques Earthly, NP. He did a 40-day fast in November and December.He admittedly stopped taking all of his medication during this time. And was complaining yesterday that he had "gained 10 pounds". He has been experiencing extreme fatigue, worse with minimal exertion. He has noticed intermittent chest pain which became worse today just after 1 PM. Described as substernal pain with radiation to left arm. Some associated nausea. Denies shortness of breath or diaphoresis. He has had a productive cough. Denies fever, chills,vomiting, diarrhea, body aches, trouble swallowing, or heartburn.He did travel to [place] in November.    Workup in the ED includesCBC with WBC 14. BUN/creatinine: 27/1.3. Chest x-ray is negative. ECG, and initial Trop-I has been unremarkable. The patient has agreed to transfer to the Observation Unit for completion of cardiac workup with Stress Testing in the morning.     03/27/18  48 year old male presented yesterday to the emergency department with chest pain and 1 week of weakness fatigue and low energy. He has been spiking fevers overnight to a T-max of 101.7. No identifiable source of infection. His echocardiogram showed an ejection fraction of 50 and no interval change. Enzymes are negative BNP is normal. His chronic systolic diastolic congestive heart failure seems to be stable. He was just seen in heart failure clinic a few days ago. We will keep the patient tonight for observation and do a nuclear stress test in the morning. If that is negative he can be discharged home.    03/28/18  Patient up pacing halls went in to discuss NM stress test  results with patient and wife. Patient complaining of 4/10 left sided chest pain radiating in to his left arm with dizziness. He denies any palpitationsor shortness of breath. Patient's EKG at this time had no changes. By the time nitro was retrieved patient laid down and chest pain went away. Then reoccurred later this afternoon while walking around again with exertion chest pain radiating to his left arm. Vital signs are stable. 1 sublingual nitroglycerin was given and pain went from a 4/10 to a 1/10.    03/29/18  Patient feels well this morning, denies chest pain.  He will stay until tomorrow for left heart catheterization    03/30/18  Patient resting in bed with his family.  He has been NPO since midnight.  He denies chest pain.      Objective:     Vitals:    03/30/18 0817   BP: 152/84   Pulse:    Resp:    Temp:    SpO2:        Physical Exam   Constitutional: He is oriented to person, place, and time. He appears well-developed and well-nourished.   HENT:   Head: Normocephalic and atraumatic.   Mouth/Throat: Oropharynx is clear and moist.   Eyes: Pupils are equal, round, and reactive to light.   Neck: Normal range of motion. Neck supple. No JVD present.   Cardiovascular: Normal rate, regular rhythm, normal heart sounds and intact distal pulses. Exam reveals no gallop and no friction rub.   No murmur heard.  Pulmonary/Chest: Effort normal and breath sounds normal. No respiratory distress. He has no wheezes.  He has no rales. He exhibits no tenderness.   Abdominal: Soft. Bowel sounds are normal. He exhibits no distension. There is no abdominal tenderness.   Musculoskeletal: Normal range of motion.         General: No tenderness or edema.   Neurological: He is alert and oriented to person, place, and time.   Skin: Skin is warm and dry. No rash noted. No erythema. No pallor.   Psychiatric: He has a normal mood and affect. His behavior is normal.   Vitals reviewed.        Labs:  Recent Results (from the past 24  hour(s))   Dextrose Stick Glucose    Collection Time: 03/29/18  9:26 PM   Result Value Ref Range    Glucose, POCT 229 (H) 71 - 99 mg/dL   Dextrose Stick Glucose    Collection Time: 03/30/18  4:41 AM   Result Value Ref Range    Glucose, POCT 187 (H) 71 - 99 mg/dL   Dextrose Stick Glucose    Collection Time: 03/30/18  7:37 AM   Result Value Ref Range    Glucose, POCT 175 (H) 71 - 99 mg/dL       Imaging:  Xr Chest 2 Views    Result Date: 03/26/2018  No acute cardiopulmonary disease. Pacemaker is unchanged. ReadingStation:[id number]    Ct Angiogram Chest    Result Date: 03/26/2018  IMPRESSION: Pulmonary angio CT showing no evidence of PE. No acute process identified. ReadingStation:[id number]      Medications:  Current Facility-Administered Medications   Medication Dose Route Frequency    aspirin  81 mg Oral Daily    atorvastatin  80 mg Oral QHS    cephALEXin  500 mg Oral QID    enoxaparin  40 mg Subcutaneous QHS    insulin lispro (1 Unit Dial)  1-9 Units Subcutaneous TID AC    And    insulin lispro (1 Unit Dial)  1-7 Units Subcutaneous QHS    lactobacillus species  50 Billion CFU Oral Daily    lisinopril  10 mg Oral Daily    metFORMIN  1,000 mg Oral QAM W/BREAKFAST    metoprolol succinate XL  25 mg Oral QAM    nitroglycerin  1 inch Topical Q6H HOLD MN       Assessment:   1.  Chest pain: Nuclear stress test 03/28/2018   Completed vasodilator protocol.   No ischemic changes with infusion   No arrhythmias.     Overall, low risk vasodilator stress rest SPECT perfusion study.   There is a large moderate to severe perfusion defect involving the apex, inferior stripe, mid and basal inferoseptal and   inferolateral segments with mild reversibility suggestive of possible ischemia but with likely strong component of diaphragmatic   attenuation artifact. Also anteroseptal perfusion defect from attenuation artifact.   No significant findings of ischemia noted.   Dilated left ventricle with mildly decreased left  ventricular function, stress ejection fraction 44%.     2.  Hypertension  3.  Diabetes  4.  Hyperlipidemia  5.  Nonischemic cardiomyopathy    Past Medical History:   Diagnosis Date    Cardiomyopathy     Congestive heart failure     Diabetes mellitus     Hypertension     PVC's (premature ventricular contractions)        Plan:   1.  Continue telemetry monitoring  2.  Aspirin daily  3.  Nitrates PRN  4.  Analgesics  5.  Antiemetics  6.  Repeat ECG PRN  7. ACEI, BB, topical nitrates  8.  Lovenox: DVT prophylaxis  9.  Cardiology following  10. LHC today  11. NPO       Rolin Barry, NP

## 2018-03-30 NOTE — Progress Notes (Signed)
Cardiology Progress Note      Date Time: 03/30/18 9:30 AM  Patient Name: Samuel Koch, Samuel Koch DOB:  02/12/1970  Attending:  Harriette Bouillon, MD    Subjective:   Mild chest pain this am no pain now for cath today. Hx of nonischemic cardiomyopathy. He had a stress test that showed diaphragmatic attenuation so scheduled with his sx for LHC/If culprit lesion not found Dr.  Raford Pitcher stated that maybe consider RHC for pulm sx of sob/doe.      Physical Exam:   Temp:  [97.5 F (36.4 C)-99 F (37.2 C)] 97.5 F (36.4 C)  Heart Rate:  [60-61] 61  Resp Rate:  [10-22] 22  BP: (114-152)/(67-84) 152/84  Temp (24hrs), Avg:98.1 F (36.7 C), Min:97.5 F (36.4 C), Max:99 F (37.2 C)    137.3 kg (302 lb 11.1 oz)   Intake and Output Summary (Last 24 hours):    Intake/Output Summary (Last 24 hours) at 03/30/2018 0930  Last data filed at 03/29/2018 1500  Gross per 24 hour   Intake 840 ml   Output    Net 840 ml       General: Awake, alert and oriented x3.  Cardiovascular: Regular rate and rhythm, normal S1/S2, no appreciable murmur  Pulmonary: Clear to auscultation bilaterally  Abdomen: soft, non-tender and non-distended with normal bowel sounds  Extremities:Warm and well-perfused, no cyanosis, clubbing or edema     Previous Anesthetic or sedation adverse reactions: No    Time and Ashby Dawes of Last Oral Intake:  Time of Last oral meal: 00:01 03/30/2018   No Liquid intake in the past 2 hours    ASA Physical Status Classification System:  Class 1 - Healthy patient    Mallampati Airway classification: Class III: Soft and hard palate and base of the uvula are visible    Anesthesia Plan: Moderate Sedation.    Estimated Blood Loss: Minimal, <38mL.      Medications:     Current Facility-Administered Medications   Medication Dose Route Frequency Provider Last Rate Last Dose    acetaminophen (TYLENOL) tablet 650 mg  650 mg Oral Q4H PRN English, Tiffany R, PA   650 mg at 03/26/18 2141    Or    acetaminophen (TYLENOL) 160 MG/5ML oral solution 650 mg   650 mg per NG tube Q4H PRN English, Tiffany R, PA        Or    acetaminophen (TYLENOL) suppository 650 mg  650 mg Rectal Q4H PRN English, Tiffany R, PA        aspirin chewable tablet 81 mg  81 mg Oral Daily English, Tiffany R, PA   81 mg at 03/30/18 0816    atorvastatin (LIPITOR) tablet 80 mg  80 mg Oral QHS Barrett, Vanita Ingles, MD   80 mg at 03/29/18 2134    cephalexin (KEFLEX) capsule 500 mg  500 mg Oral QID Beau Fanny L, NP   500 mg at 03/30/18 0635    dextrose (D10W) 10% bolus 125 mL  125 mL Intravenous PRN Beau Fanny L, NP        enoxaparin (LOVENOX) syringe 40 mg  40 mg Subcutaneous QHS English, Tiffany R, PA   40 mg at 03/29/18 2135    furosemide (LASIX) tablet 40 mg  40 mg Oral PRN English, Tiffany R, PA        glucagon (rDNA) (GLUCAGEN) injection 1 mg  1 mg Intramuscular PRN Steward, Joyce Copa, NP        HYDROcodone-acetaminophen (NORCO) 5-325 MG  per tablet 1-2 tablet  1-2 tablet Oral Q4H PRN English, Tiffany R, PA        insulin lispro (1 Unit Dial) (HumaLOG) injection pen 1-9 Units  1-9 Units Subcutaneous TID AC Steward, Jessica L, NP   1 Units at 03/30/18 1914    And    insulin lispro (1 Unit Dial) (HumaLOG) injection pen 1-7 Units  1-7 Units Subcutaneous QHS Conni Elliot, NP   1 Units at 03/29/18 2135    And    insulin lispro (1 Unit Dial) (HumaLOG) injection pen 1-7 Units  1-7 Units Subcutaneous Daily PRN Conni Elliot, NP        lactobacillus species (BIO-K PLUS) capsule 50 Billion CFU  50 Billion CFU Oral Daily Steward, Jessica L, NP   50 Billion CFU at 03/30/18 0817    lisinopril (PRINIVIL,ZESTRIL) tablet 10 mg  10 mg Oral Daily English, Tiffany R, PA   10 mg at 03/30/18 7829    metFORMIN (GLUCOPHAGE) tablet 1,000 mg  1,000 mg Oral QAM W/BREAKFAST English, Tiffany R, [place]   Stopped at 03/27/18 5621    metoprolol succinate XL (TOPROL-XL) 24 hr tablet 25 mg  25 mg Oral QAM English, Tiffany R, PA   25 mg at 03/30/18 3086    morphine injection 2-4 mg  2-4 mg  Intravenous Q4H PRN Esmond Plants, NP        naloxone [facility]) injection 0.4 mg  0.4 mg Intravenous PRN English, Tiffany R, PA        nitroglycerin (NITRO-BID) 2 % ointment 1 inch  1 inch Topical Q6H HOLD MN Puray, Johny Drilling, NP   1 inch at 03/30/18 0636    nitroglycerin (NITROSTAT) SL tablet 0.4 mg  0.4 mg Sublingual Q5 Min PRN English, Tiffany R, PA   0.4 mg at 03/28/18 1847    ondansetron (ZOFRAN-ODT) disintegrating tablet 4 mg  4 mg Oral Q6H PRN English, Tiffany R, PA        Or    ondansetron (ZOFRAN) injection 4 mg  4 mg Intravenous Q6H PRN English, Tiffany R, PA           Labs and Studies:     Recent Labs   Lab 03/27/18  1300 03/26/18  1624   WBC 6.3 14.2*   Hemoglobin 13.7 15.2   Hematocrit 41.0 45.6   PLT CT 210 264     Recent Labs   Lab 03/27/18  1300 03/26/18  1624  03/25/18  1029   Glucose 165* 274*  More results in Results Review  --    BUN 20 27*  --   --    Creatinine 0.95 1.27  --   --    i-STAT Creatinine  --   --   --  0.80   Sodium 136 136  More results in Results Review  --    Potassium 4.1 4.9  More results in Results Review  --    Chloride 103 101  More results in Results Review  --    CO2 28 26  More results in Results Review  --    More results in Results Review = values in this interval not displayed.     Recent Labs   Lab 03/26/18  1624   Cholesterol 201*   Triglycerides 228*   HDL 47   LDL Calculated 108           Invalid input(s): TROPONINT  Lab Results   Component Value Date  DDIMER 0.69 (H) 03/26/2018     Lab Results   Component Value Date    BNP <10.0 03/26/2018     No results found for: HGBA1C         Radiology:   No results found.    Impression and Plan:   1. Cp, abnormal nuc stress test with attenuation and continued sx for cath today, risk/benfits reviewed consent signed.   2. DM  3. Htn, controlled.  4. Hyperlipidemia was placed on high dose statin with Lipitor 80mg      Jefferson Fuel  [facility]  [address], [address]  [place],  [place] [address]  [phone]

## 2018-04-03 ENCOUNTER — Telehealth: Payer: Self-pay

## 2018-04-03 NOTE — Telephone Encounter (Signed)
[facility] 2/20-2/24 CP, SOB, FATIGUE. PT STILL HAVING SXS WIFE ADAMANT THAT PT SXS ARE CARDIAC AND NEEDS SEEN.    Wife is adamant that there is something being missed and that pt needs to be seen. Added on for HFU 04/06/18 with Candelaria Celeste NP while ADV in office. Advised ED in the interim should sxs worsen or persist and she is in agreement    Ahill PN

## 2018-04-05 NOTE — Progress Notes (Signed)
Cardiology Follow up Note    Patient Name: Samuel Koch   Date of Birth: [birth year]    Provider: Sula Rumple, NP     Patient Care Team:  Patsy Lager, MD as PCP - General  Abel Presto, DO as Consulting Physician (Cardiology)  Elizbeth Squires, NP as Nurse Practitioner (Family Nurse Practitioner)  Joneen Roach, PA as Physician Assistant (Physician Assistant)    Chief Complaint: No chief complaint on file.      Impression and Recommendations:     1. Acute on chronic combined systolic and diastolic Heart failure: Patient has gained a total of 20-30 pounds over the past 1 month to 5 weeks. Increase frequncy of lasix from PRN to daily. Start K-dur . Repeat CMP in 1 week at PCP office. Currently with NYHA Class II-III symptoms.     2. SSS: Dual chamber PM in place. Device check today below. Normal functionality.     3. NICCM: LVEF now up to 50% on recent echo. See results below. Cont. Current. Medications. Increase frequency of lasix from PRN to 40 mg daily.     4. PVC's: good control. S/P PVC ablation.     5. Presence of permanent pacemaker: Normal functions. Device check results below. Cont. Pacing.     Follow up in 6 weeks with Haynes Bast or Candelaria Celeste   History of Present Illness   Samuel Koch is a 48 y.o. male is being seen here today for a hospital follow-up. He is followed by Dr. [name] as his primary cardiologist. He has a  history of nonischemic cardiomyopathy which has since improved. Recent echo during hospitalization showed LVEF of 50%, with a grade II diastolic dysfunction.      He has a history of frequent PVCs and is status post PVC ablation in [place], [place].  He has had good control of his PVCs since his ablation.    He has a dual-chamber pacemaker that was implanted for sick sinus syndrome.    He was admitted to the hospital from 03/26/2018 -03/30/2018 for chest pain. He had a lexiscan stress test that was read as low risk but showed a concerning area of possible  ischemia. He then had right and left heart cath completed showing non-obstructive normal coronary arteries. Echocardiogram was also completed 03/27/2018 showing improvement in his LVEF now of 50%. He does have a PM for SSS.     He reports that at his baseline he is able to workout on the treadmill walking 4-1/2 miles on the highest incline.  He reports he was able to do this well on February 03, 2018.  However for the past 3 weeks, possibly a little more he has developed extreme fatigue, feeling like his legs are extremely heavy as if "walking through concrete", fluid retention in his legs and arms, dyspnea on exertion especially with walking up stairs.  He also endorses left-sided chest pain worse with activity that radiates down the back of his left arm.  He does now state he has early satiety and a dry cough.    He had a very thorough device check today that shows that his leads are stable.  He has had 80+ episodes of sinus tachycardia with his heart rate up into the 150s-160s which has correlated with the times he would be at the gym exercising.  His battery life is good.  He is a pacing 46% and V pacing less than 1%.  His rate response is on.  He did also have one episode on 04/02/2018 VT lasting 3 seconds only.  He has not had no other events of VT.    He and his wife offer that he does not watch his sodium intake.  He does not limit his sodium nor does he limit his fluid intakes.  He is drinking upwards of 2 to 3 L of fluids per day.  He states that he has had difficulty eating breaking a sweat while at the gym and therefore he has tried to push himself more resulting in worsening fatigue.  He has had approximately 20 to 30 pound weight gain over the past 4 to 6 weeks that can only presumably be fluid overload.  He was instructed on his last heart failure visit to start taking his Lasix every day.  He also did not comply with this he states that most he is taking his Lasix 40 mg tablets 3 times per week.    We  discussed his dry weight which is likely 285 pounds.  This is our goal to have been maintained.    We have thoroughly discussed the necessity for a lifetime change in his sodium intake to be limited to no more to 2 g/day.  At this time I have fluid restricted him to no more than 45 ounces or 1.5 liters per day.  This will be adjusted based on his weight.  He has been instructed on the importance of compliance with his medications as prescribed by the clinical providers.  He also has been instructed that this is a lifetime issue that we will need to continue to maintain these lifestyle changes.    His wife and he do state that once he starts to feel well he goes back to drinking increased amount of fluids and not watching his sodium in the past    Stress test results:  CONCLUSIONS                                                                   Risk Assessment: Low    Completed vasodilator protocol.     No ischemic changes with infusion    No arrhythmias.        Overall, low risk vasodilator stress rest SPECT perfusion study.    There is a large moderate to severe perfusion defect involving the apex, inferior stripe, mid and basal inferoseptal and     inferolateral segments with mild reversibility suggestive of possible ischemia but with likely strong component of diaphragmatic     attenuation artifact.  Also anteroseptal perfusion defect from attenuation artifact.    No significant findings of ischemia  noted.    Dilated left ventricle with mildly decreased left ventricular function, stress ejection fraction 44%.     Right and left heart catheterization 03/30/2018 by Dr. Karie Mainland  ANGIOGRAPHIC DATA:     Left Main: Normal appearing vessel shows no angiographic disease   LAD: Normal appearing vessel shows no angiographic evidence of disease   Left Circumflex: Normal   RCA: Dominant vessel, minor luminal irregularities   Left ventriculography: LV gram was not performed as the patient recently underwent nuclear stress  test which shows near normal LV systolic function.    RECOMMENDATIONS:    1. Continued risk factor  modification.  2.  False positive nuclear stress test    Echocardiogram from 03/27/2018  Conclusions        1: Technically difficult study with limited endocardial definition and structural resolution in the apical                        views.  Intravenous contrast was administered to enhance endocardial definition.                       2: The left ventricular cavity size is normal. There is mild concentric left ventricular hypertrophy. The left                        ventricular systolic function is at the lower limits of normal. The left ventricular ejection fraction is                        estimated at 50%. There are no regional wall motion abnormalities present.                       3: Left ventricular diastolic filling pattern is probably pseudonormalized, consistent with grade 2 left                        ventricular diastolic dysfunction and elevated left atrial pressure.                       4: Right-sided chambers are not optimally visualized but appear grossly normal in size.                       5: Pacemaker lead is visualized within the right heart chamber.                       6: There is no significant valvular heart disease.                        8: Previous study 10/07/17 was reviewed.  There has been no significant interval change.        Review of Systems     Constitution: Negative for activity change, appetite change, fatigue, fever, and unexpected weight change  HENT: Negative for trouble swallowing, and voice change  Eyes: Negative for any visual disturbances  Respiratory: Negative for apnea and/or awakening with sob, chest tightness, and sob  Cardiovascular: Negative for chest pain/discomfort, leg or ankle swelling, difficulty breathing lying down, and palpitations    Comprehensive review of systems performed by me. Unless otherwise noted, all systems are negative.      Past Medical  History     Past Medical History:   Diagnosis Date    Cardiomyopathy     Congestive heart failure     Diabetes mellitus     Hypertension     PVC's (premature ventricular contractions)        Past Medical History 03/09/18 LLB-    1. Chronic combined systolic and diastolic CHF  2. Nonischemic cardiomyopathy  A. LHC (6/17): 30% mid RCA, no other significant disease.   B. Echo (6/17): EF 30-35%.   C. Holter (7/17): 19% PVCs.   D. Echo (3/18): EF 50-55%, moderate LVH, grade II diastolic dysfunction.   E. Echo (9/18): EF 50-55%  F. Echo 10/07/17- LV cavity  size is increased. The LVEF is est. at 50%. Mild hypokinesis of the distal septum and apex which may be due to paced rhythm. Pacer wire present in RV.     3. Type II diabetes  4. Hypertriglyceridemia  5. PVCs  A. 48 hour holter (7/17) with 19% PVCs.  B. PVC ablation in 8/17 but PVCs recurred.   C. Holter (11/17) with rare PVCs, < 1%.   D. Holter (9/18) with rare PVCs only    6. Bradycardia/ SSS  A. Dual chamber pacemaker placed (Medtronic, MRI compatible).     7. NSVT    Past Surgical History     Past Surgical History:   Procedure Laterality Date    APPENDECTOMY      CARDIAC PACEMAKER PLACEMENT      RIGHT & LEFT HEART CATH  W/ CORONARY ANGIOS, LV/LA Right 03/30/2018    Procedure: RIGHT & LEFT HEART CATH  W/ CORONARY ANGIOS, LV/LA;  Surgeon: Santo Held, MD;  Location: [facility] CATH/EP;  Service: Cardiovascular;  Laterality: Right;       Family History     Family History   Problem Relation Age of Onset    Heart disease Father     Hypertension Father     Heart disease Sister     Heart attack Sister     Heart disease Brother     Diabetes Brother     Diabetes Paternal Grandmother        Social History     Tobacco: Patient  reports that he has quit smoking. He has quit using smokeless tobacco.  Alcohol: Patient  reports no history of alcohol use.  Drug use: Patient  reports no history of drug use.    Allergies     No Known Allergies    Medications       Current  Outpatient Medications:     atorvastatin (LIPITOR) 20 MG tablet, Take 1 tablet (20 mg total) by mouth nightly, Disp: 30 tablet, Rfl: 2    furosemide (LASIX) 40 MG tablet, Take 1 tablet (40 mg total) by mouth as needed (weight gain), Disp: 30 tablet, Rfl: 1    insulin glargine 100 UNIT/ML injection pen, Inject 25 Units into the skin nightly, Disp: 1 pen, Rfl: 1    insulin lispro, 1 Unit Dial, (HUMALOG KWIKPEN) 100 UNIT/ML Solution Pen-injector injection pen, Inject 8 Unit into the skin 3 (three) times daily before meals, Disp: 3 mL, Rfl: 1    Insulin Pen Needle (PEN NEEDLES 3/16") 31G X 5 MM Misc, 1 each by Does not apply route 4 (four) times daily, Disp: 901 each, Rfl: 1    lisinopril (PRINIVIL,ZESTRIL) 10 MG tablet, Take 1 tablet (10 mg total) by mouth daily, Disp: 90 tablet, Rfl: 1    metFORMIN (GLUCOPHAGE) 500 MG tablet, Take 2 tablets (1,000 mg total) by mouth every morning with breakfast, Disp: 60 tablet, Rfl: 11    metoprolol succinate XL (TOPROL-XL) 25 MG 24 hr tablet, Take 25 mg by mouth every morning, Disp: , Rfl:       Physical Exam     There were no vitals taken for this visit.  Wt Readings from Last 3 Encounters:   03/26/18 137.3 kg (302 lb 11.1 oz)   03/25/18 139.8 kg (308 lb 2 oz)   03/09/18 135.5 kg (298 lb 12.8 oz)     Physical Exam    Constitutional -  Well appearing, and in no distress  Eyes - Pupils equal and reactive,  extraocular eye movements intact, sclera anicteric  Oropharynx - Moist mucous membranes  Respiratory - Clear to auscultation bilaterally, normal respiratory effort    Cardiovascular system -    Regular rate and rhythm    Normal S1, S2    No murmurs, rubs, gallops   Jugular venous pulse is normal        Carotid upstroke normal, no carotid bruits auscultated   2+ pulses in the posterior tibial / dorsalis pedis bilaterally    Neurological - Alert, oriented, no focal neurological deficits  Extremities -  No clubbing or cyanosis. No peripheral edema  Skin - Warm and dry, no  rashes  Psych- Appropriate affect    Labs     Lab Results   Component Value Date/Time    WBC 6.3 03/27/2018 01:00 PM    RBC 4.47 03/27/2018 01:00 PM    HGB 13.7 03/27/2018 01:00 PM    HCT 41.0 03/27/2018 01:00 PM    PLT 210 03/27/2018 01:00 PM        Lab Results   Component Value Date/Time    NA 136 03/27/2018 01:00 PM    K 4.1 03/27/2018 01:00 PM    CL 103 03/27/2018 01:00 PM    CO2 28 03/27/2018 01:00 PM    GLU 165 (H) 03/27/2018 01:00 PM    BUN 20 03/27/2018 01:00 PM    CREAT 0.95 03/27/2018 01:00 PM    PROT 7.2 10/06/2017 08:18 PM    ALKPHOS 89 10/06/2017 08:18 PM    AST 13 10/06/2017 08:18 PM    ALT 19 10/06/2017 08:18 PM       Lab Results   Component Value Date/Time    CHOL 201 (H) 03/26/2018 04:24 PM    TRIG 228 (H) 03/26/2018 04:24 PM    HDL 47 03/26/2018 04:24 PM    LDL 108 03/26/2018 04:24 PM       Lab Results   Component Value Date/Time    HGBA1CPERCNT 8.2 03/26/2018 04:24 PM       Cardiogenics:   EKG:           Respectfully,   Sula Rumple, NP  04/05/2018      Electronically signed by:  Sula Rumple, NP

## 2018-04-06 ENCOUNTER — Encounter: Payer: Self-pay | Admitting: Acute Care

## 2018-04-06 ENCOUNTER — Ambulatory Visit: Payer: 59 | Admitting: Acute Care

## 2018-04-06 ENCOUNTER — Telehealth: Payer: Self-pay

## 2018-04-06 VITALS — BP 140/84 | HR 64 | Ht 74.0 in | Wt 309.2 lb

## 2018-04-06 DIAGNOSIS — Z79899 Other long term (current) drug therapy: Secondary | ICD-10-CM

## 2018-04-06 DIAGNOSIS — R079 Chest pain, unspecified: Secondary | ICD-10-CM

## 2018-04-06 DIAGNOSIS — I5042 Chronic combined systolic (congestive) and diastolic (congestive) heart failure: Secondary | ICD-10-CM

## 2018-04-06 MED ORDER — POTASSIUM CHLORIDE ER 10 MEQ PO TBCR
10.00 meq | EXTENDED_RELEASE_TABLET | Freq: Every day | ORAL | 3 refills | Status: DC
Start: 2018-04-06 — End: 2018-10-02

## 2018-04-06 NOTE — Patient Instructions (Addendum)
Take 40 mg of lasix every day. Goal weight is 285 pounds.   Cut fluid intake down to 45 oz day  Weigh yourself daily. Call for weight increase of more than 2 pounds over a 2 day period  Please have your PCP draw a CMP with your labs on 04/14/2018 and send to me

## 2018-04-06 NOTE — Telephone Encounter (Signed)
No current PCP. Updates requested in Care Everywhere.

## 2018-04-08 ENCOUNTER — Encounter: Payer: Self-pay | Admitting: Family Nurse Practitioner

## 2018-04-08 DIAGNOSIS — I480 Paroxysmal atrial fibrillation: Secondary | ICD-10-CM

## 2018-04-08 DIAGNOSIS — I5042 Chronic combined systolic (congestive) and diastolic (congestive) heart failure: Secondary | ICD-10-CM

## 2018-04-08 DIAGNOSIS — I428 Other cardiomyopathies: Secondary | ICD-10-CM

## 2018-04-14 ENCOUNTER — Ambulatory Visit: Payer: 59

## 2018-04-14 ENCOUNTER — Telehealth: Payer: Self-pay

## 2018-04-14 DIAGNOSIS — E860 Dehydration: Secondary | ICD-10-CM | POA: Insufficient documentation

## 2018-04-14 DIAGNOSIS — I428 Other cardiomyopathies: Secondary | ICD-10-CM | POA: Insufficient documentation

## 2018-04-14 DIAGNOSIS — R0609 Other forms of dyspnea: Secondary | ICD-10-CM | POA: Insufficient documentation

## 2018-04-14 DIAGNOSIS — R739 Hyperglycemia, unspecified: Secondary | ICD-10-CM | POA: Insufficient documentation

## 2018-04-14 DIAGNOSIS — I480 Paroxysmal atrial fibrillation: Secondary | ICD-10-CM | POA: Insufficient documentation

## 2018-04-14 DIAGNOSIS — I5042 Chronic combined systolic (congestive) and diastolic (congestive) heart failure: Secondary | ICD-10-CM | POA: Insufficient documentation

## 2018-04-14 DIAGNOSIS — I10 Essential (primary) hypertension: Secondary | ICD-10-CM | POA: Insufficient documentation

## 2018-04-14 DIAGNOSIS — N289 Disorder of kidney and ureter, unspecified: Secondary | ICD-10-CM | POA: Insufficient documentation

## 2018-04-14 DIAGNOSIS — R5383 Other fatigue: Secondary | ICD-10-CM | POA: Insufficient documentation

## 2018-04-14 DIAGNOSIS — D649 Anemia, unspecified: Secondary | ICD-10-CM | POA: Insufficient documentation

## 2018-04-14 DIAGNOSIS — R55 Syncope and collapse: Secondary | ICD-10-CM | POA: Insufficient documentation

## 2018-04-14 NOTE — Telephone Encounter (Signed)
Pt no show for appointment.  Left voice mail asking pt to call to reschedule.

## 2018-04-20 ENCOUNTER — Telehealth: Payer: Self-pay

## 2018-04-20 NOTE — Telephone Encounter (Signed)
Reminder phone call to patient of upcoming CHF appointment on 3/17 no answer no voicemail.

## 2018-04-21 ENCOUNTER — Telehealth: Payer: Self-pay | Admitting: Family Nurse Practitioner

## 2018-04-21 ENCOUNTER — Ambulatory Visit
Admission: RE | Admit: 2018-04-21 | Discharge: 2018-04-21 | Disposition: A | Discharge status: Home / Self Care | Payer: 59 | Source: Ambulatory Visit | Attending: Family Nurse Practitioner | Admitting: Family Nurse Practitioner

## 2018-04-21 VITALS — BP 141/97 | HR 88 | Resp 16 | Wt 306.0 lb

## 2018-04-21 DIAGNOSIS — Z950 Presence of cardiac pacemaker: Secondary | ICD-10-CM

## 2018-04-21 DIAGNOSIS — I5042 Chronic combined systolic (congestive) and diastolic (congestive) heart failure: Secondary | ICD-10-CM

## 2018-04-21 DIAGNOSIS — I10 Essential (primary) hypertension: Secondary | ICD-10-CM

## 2018-04-21 DIAGNOSIS — I428 Other cardiomyopathies: Secondary | ICD-10-CM

## 2018-04-21 DIAGNOSIS — I480 Paroxysmal atrial fibrillation: Secondary | ICD-10-CM

## 2018-04-21 MED ORDER — LISINOPRIL 20 MG PO TABS
20.0000 mg | ORAL_TABLET | Freq: Every day | ORAL | 1 refills | Status: DC
Start: 2018-04-21 — End: 2018-10-02

## 2018-04-21 MED ORDER — CARVEDILOL 6.25 MG PO TABS
6.2500 mg | ORAL_TABLET | Freq: Two times a day (BID) | ORAL | 1 refills | Status: DC
Start: 2018-04-21 — End: 2018-05-25

## 2018-04-21 NOTE — Patient Instructions (Addendum)
Heart failure causes many symptoms.   Some of them are more serious than others.   It is important to recognize when you should call 911 for emergency help and when you should call your doctor or nurse for urgent attention.    The following lists can help you decide what to do when you experience certain symptoms. You may want to print this page and keep it in a convenient spot.    Emergency Symptoms of Heart Failure  Call 911 for emergency help, if you have:    -Chest discomfort or pain that lasts more than 15 minutes that is not relieved with rest or nitroglycerin.  -Severe, persistent shortness of breath.  -Fainted or passed out.    Urgent Symptoms of Heart Failure  Call your doctor immediately, if you have any of the following symptoms:    -Increasing shortness of breath or a new shortness of breath while resting.  -Trouble sleeping due to difficulty breathing. For example, waking up suddenly at night due to difficulty breathing.  -A need to sleep sitting up or on more pillows than usual.  -Fast or irregular heart beats, palpitations, or a racing heart that persists and makes you feel dizzy or lightheaded.    Or if you:  -Cough up frothy or pink sputum.  -Feel like you may faint or pass out.    To manage your heart failure, please follow the following recommendations:    Fluid restriction:  You should follow a daily fluid restriction not to exceed 2 Liters daily (equivalent to 64 fluid ounces). This includes all things liquid at room temperature such as: all beverages, soup, ice cream, popsicles, jello and ice (you will need to guess how much the ice would be when melted and add toward your daily total). Keep track of your fluid intake throughout the day and measure everything.     Daily weights:  Weigh yourself every day when you wake up, right after using the bathroom. Keep a record of your daily weights and call our office if you gain more than 2-3 pounds in 1 day or 5 pounds in less than a week.     Low  sodium diet:  Sodium can lead to fluid retention and patients with heart failure should follow the low sodium diet, do not exceed 2000 mg sodium daily. Do not cook with salt or add it at the table. This includes table salt, sea salt, Kosher salt, garlic salt, seasoned salt or other seasoning's with salt (read the nutrition label). Avoid eating out at restaurants and high sodium foods such as processed meats, pre-packaged meals, pickles, soy sauce, chips, canned soups and bacon. Ask your healthcare provider for more information.    Salt Substitutes   When you eat sodium, your body dilutes it with water. This causes water retention, high blood pressure and a tough job for your heart! Limiting salt will give your heart a well-deserved break but you dont have to sacrifice taste! Salt substitutes enhance the flavor of food without adding sodium. Replace your salt shaker with herbs & spices to support the health of your heart.    Potassium-Salt versus Sodium Salt-Substitutes  Some salt substitutes use Potassium Chloride instead of Sodium Chloride. For some, extra potassium is a bad idea because too much may cause heart rhythm problems. Never use a salt substitute with potassium chloride without first checking with your health care provider.     Contain Potassium:  - Nu-Salt by [employer] Low   -  NoSalt  - Morton Salt Substitute  - AlsoSalt    No Potassium or Sodium:  - Mrs. Dash (in 10+ flavors)  - [place] No Added Salt (in 4 flavors)  - Emerils No Salt (only 1 flavor: Essence [place])  - Sport and exercise psychologist by [employer] (in 7 flavors)  Herbs & Spices  - lemon or lime juice  - flavored vinegar  - thyme   - basil   - oregano  - sage  - nutmeg   - black pepper  - cayenne pepper  - paprika   - ginger   - cumin  - onion powder  - mace   - celery seed  - garlic powder  - fresh garlic  - curry powder  - mustard seed  - parsley flakes  - cinnamon  - bay leaf     Other Salt-Free Ideas  Season or marinate  meat, poultry, and fish ahead of time with onion, garlic, and your favorite herbs before cooking to bring out the flavor.    Activity:  We encourage you to engage in regular physical activity, such as daily walking. Start with short distances and walk at a comfortable pace. You should be able to "walk and talk". Gradually increase the distance and time that you walk. Exercise will help you feel better and can relieve the symptom of "heavy, tired legs". Stop exercise if you have chest pain, dizziness, or get so short of breath that you can not talk.   You should not lift/push/or pull heavy objects (anything heavy enough to make you strain).     Medications:  Take all your medications as prescribed by your health care provider and bring all your medication bottles to every appointment. It is important that you know what you are taking, when, and for what reason.    Medications you should not take:  Nonsteroidal anti-inflammatory drugs (NSAIDS): such as ibuprofen, Advil, Motrin, naprosyn, Naproxen, may cause fluid retention. You should not take these medications.    Call your healthcare provider if you are not feeling well or have new problems. We will call you back within 24 hours (Monday-Friday). In case of an emergency, call 911 or go to the nearest emergency department.      Thank you for choosing the [facility] at [facility] with [facility] for your care. It is important to keep all your medical appointments, even if you feel well. To reschedule or cancel an appointment with our clinic, call [phone]. If you need to leave a message on the nurse line for the heart failure clinic, please call [phone] or [phone].                Stop Toprol-XL, start Coreg 6.25 mg twice a day.  Increase lisinopril to 20 mg daily.  Continue with other medications.  Try to take insulin regularly.

## 2018-04-21 NOTE — Telephone Encounter (Signed)
Patient was referred to diabetes education to help with diet and management of his diabetes.  Received message below from Clydie Braun in diabetes education.    Seth Bake, RN  Elizbeth Squires, NP   Cc: Seward Grater            We scheduled your patient for an appointment twice. She did not come to her last appointment. We left her a voicemail a week ago to call back to r/s. She has not called. She has an appointment with Dr. Mellody Drown today at 10a. If she wants to r/s call us and we can get that done. Clydie Braun

## 2018-04-21 NOTE — Progress Notes (Signed)
I had the pleasure of seeing [name] today at [facility] for an advanced heart failure follow up visit.  Follows with Dr. [name] at [facility] cardiology.    Samuel Koch is a 48 y.o. male with nonischemic cardiomyopathy, chronic combined systolic and diastolic heart failure, latest EF 50%, NYHA Class 3, ACC/AHA Stage C.  Has dual-chamber pacemaker in place.  Status post PVC ablation.  Also uncontrolled type II diabetic, obesity.  Recent hospital visit and discharged on March 30, 2018, he complained of chest pain, underwent nuclear stress testing 03/28/2018 which showed no significant ischemic changes on the vasodilator stress protocol.  Overall low risk stress test.  Although there were significant perfusion defects of the apex, inferior, inferoseptal and inferolateral segments, and suggested there also significant attenuation artifact.  Notably, dilated left ventricle with EF of 44%.  Subsequently went underwent cath which showed fairly normal coronary arteries, overall it was a false positive nuclear stress test.    [name] often and to miss appointments in the heart failure clinic.  Also when he goes away on mission trips, tends not to take his insulin.  Also when he participates in a multiweek fast where he does not like the medications and loses about 30 to 40 pounds at a time.    At last cardiology visit on 04/06/2018, due to recent weight gain, increased frequency of Lasix to daily, device check was normal.  At last visit, lab work was checked, received IV diuretic push.    He complains of profound weakness, history of a walk 4 miles a day before coming to hospital, now is having difficulty getting up and walking 1 block, he is short of breath with 1 flight of stairs, admits to mild orthopnea and inability to lay completely flat, progressive sleep in a chair.  However also having episodes of dizziness especially upon standing from a sitting position.  His maintenance  diuretics 40 mg every day now.  He does take long-acting insulin but not taking short acting during the day with meals.  Blood pressure 141/97 with a heart rate of 88 today.  Endorses good appetite but having no motivation or energy to exercise because of extreme fatigue.  He endorses he did not feel his fatigue when he was not on the beta-blocker.    Lab work done at primary care's office earlier today shows creatinine 0.9, fasting glucose 197, potassium 4.5, bicarb 22.  Hemoglobin A1c 8.5.     He denies any palpitations, syncope, or PND. He has had no chest pain or pressure.  His appetite has been okay and he denies any early satiety, nausea, vomiting or diarrhea.  He is able to climb 1 flights of stairs and is not exercising regularly.     Review of Systems   Constitution: Negative for appetite change,  fever, Positive for activity change and fatigue and unexpected weight change  HENT: Negative for trouble swallowing, and voice change  Eyes: Negative for any visual disturbances  Respiratory: Positive for sob  Cardiovascular: See above  Gastrointestinal: Negative for abdominal pain, blood in stool, nausea, and vomiting Positive for bloating   Endocrine: Negative for cold intolerance, heat intolerance, and polydipsia  GU: Negative for hematuria  Musculoskeletal: Negative for gait problems, joint swelling, neck pain, and pain or heaviness in legs  Skin: Negative for rash, wound, and ulcers on legs/feet  Neurological: Negative for dizziness, headaches, and syncope  Hematologic: Negative for bruises  Psychiatric: Negative for anxiety, and sleep  disturbance    Comprehensive review of systems performed by me. Unless otherwise noted, all systems are negative.      Most recent laboratory testing reveals:   LABORATORY DATA:  Lab Results   Component Value Date    BUN 20 03/27/2018    NA 136 03/27/2018    K 4.1 03/27/2018    CL 103 03/27/2018    CO2 28 03/27/2018     Lab Results   Component Value Date    WBC 6.3 03/27/2018     HGB 13.7 03/27/2018    HCT 41.0 03/27/2018    MCV 92 03/27/2018    PLT 210 03/27/2018       Medical Problems:  Patient Active Problem List    Diagnosis Date Noted    Chest pain 03/26/2018    NICM (nonischemic cardiomyopathy) 10/20/2017    Chronic combined systolic and diastolic HF (heart failure) 10/20/2017    Obesity, morbid, BMI 40.0-49.9 10/07/2017    DOE (dyspnea on exertion) 10/06/2017    Hyperglycemia 03/29/2017    Essential hypertension 03/29/2017    Combined systolic and diastolic congestive heart failure 03/29/2017    Dehydration, moderate 03/29/2017    Anemia, unspecified type 03/29/2017    Renal insufficiency 03/29/2017    Near syncope 03/28/2017       History from Cardiology at [facility] [place]  1. 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   - LHC (6/17): 30% mid RCA, no other significant disease.   - Echo (6/17): EF 30-35%.   - Holter (7/17): 19% PVCs.   - Echo (3/18): EF 50-55%, moderate LVH, grade II diastolic dysfunction.   - CPX (3/18): submaximal, RER 0.99, VO2 20.8 (89% predicted), VE/VCO2 29 => no significant HF limitation.   - Echo (9/18): EF 50-55%  - echo 10/17/2017 ef 50%    2. Morbid obesity    3. Type II diabetes, uncontrolled    4. Hypertriglyceridemia    5. NSVT/PVCs:   - 48 hour holter (7/17) with 19% PVCs.  - PVC ablation in 8/17 but PVCs recurred.   - Holter (11/17) with rare PVCs, < 1%.   - Holter (9/18) with rare PVCs only    6. 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     7. Sleep study (1/18): Mild OSA - no cpap recommended  .   8. ABIs (3/18): Normal    9. Leg weakness with atorvastatin.     Past Medical History:   Diagnosis Date    Cardiomyopathy     Congestive heart failure     Diabetes mellitus     Hypertension     PVC's (premature ventricular contractions)      Past Surgical History:   Procedure Laterality Date    APPENDECTOMY      CARDIAC PACEMAKER  PLACEMENT      RIGHT & LEFT HEART CATH  W/ CORONARY ANGIOS, LV/LA Right 03/30/2018    Procedure: RIGHT & LEFT HEART CATH  W/ CORONARY ANGIOS, LV/LA;  Surgeon: Santo Held, MD;  Location: [facility] CATH/EP;  Service: Cardiovascular;  Laterality: Right;     Family History   Problem Relation Age of Onset    Heart disease Father     Hypertension Father     Heart disease Sister     Heart attack Sister     Heart disease Brother     Diabetes Brother     Diabetes Paternal Grandmother  Social History     Socioeconomic History    Marital status: Married     Spouse name: Not on file    Number of children: Not on file    Years of education: Not on file    Highest education level: Not on file   Occupational History    Not on file   Social Needs    Financial resource strain: Not on file    Food insecurity:     Worry: Not on file     Inability: Not on file    Transportation needs:     Medical: Not on file     Non-medical: Not on file   Tobacco Use    Smoking status: Former Smoker    Smokeless tobacco: Former Neurosurgeon   Substance and Sexual Activity    Alcohol use: No    Drug use: No    Sexual activity: Not on file     Comment: Deferred   Lifestyle    Physical activity:     Days per week: Not on file     Minutes per session: Not on file    Stress: Not on file   Relationships    Social connections:     Talks on phone: Not on file     Gets together: Not on file     Attends religious service: Not on file     Active member of club or organization: Not on file     Attends meetings of clubs or organizations: Not on file     Relationship status: Not on file    Intimate partner violence:     Fear of current or ex partner: Not on file     Emotionally abused: Not on file     Physically abused: Not on file     Forced sexual activity: Not on file   Other Topics Concern    Not on file   Social History Narrative    Not on file     Married     Non smoker - isolated smoking before age 2  Isolated drinking before age 72 -  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  Their youngest daughter is Diamond Nickel -he is 70 years old and was adopted by them 2 years ago.      Current Outpatient Medications   Medication Sig Dispense Refill    atorvastatin (LIPITOR) 20 MG tablet Take 1 tablet (20 mg total) by mouth nightly 30 tablet 2    furosemide (LASIX) 40 MG tablet Take 1 tablet (40 mg total) by mouth as needed (weight gain) (Patient taking differently: Take 40 mg by mouth as needed (weight gain) Pt taking daily  ) 30 tablet 1    Insulin Pen Needle (PEN NEEDLES 3/16") 31G X 5 MM Misc 1 each by Does not apply route 4 (four) times daily 901 each 1    lisinopril (PRINIVIL,ZESTRIL) 10 MG tablet Take 1 tablet (10 mg total) by mouth daily 90 tablet 1    metoprolol succinate XL (TOPROL-XL) 25 MG 24 hr tablet Take 25 mg by mouth every morning      potassium chloride (K-TAB, KLOR-CON) 10 MEQ tablet Take 1 tablet (10 mEq total) by mouth daily  90 tablet 3    insulin lispro, 1 Unit Dial, (HUMALOG KWIKPEN) 100 UNIT/ML Solution Pen-injector injection pen Inject 8 Unit into the skin 3 (three) times daily before meals 3 mL 1     No current facility-administered medications for this encounter.      No Known Allergies    Vitals:  BP (!) 141/97    Pulse 88    Resp 16    Wt 138.8 kg (306 lb)    SpO2 99%    BMI 39.29 kg/m . Estimated body mass index is 39.29 kg/m as calculated from the following:    Height as of 04/06/18: 1.88 m (6\' 2" ).    Weight as of this encounter: 138.8 kg (306 lb).  His weight is up 6 lbs office visit - up 10 lbs per recent weights?     Physical Exam:   General:overweight male, alert, NAD  HEENT:  Anicteric sclera, supple, no significant JVD.  Lungs: CTA B/L, no rhonchi, wheezes or crackles with normal rate and  effort  Cardiovascular: s1s2, normal rate, regular rhythm, no m/r/g  Abdominal: Soft, obese, nontender  Extremities: warm to touch, no edema  Neuromuscular exam: grossly non-focal, though not formally tested.    Assessment and Plan:  1. Chronic combined systolic and diastolic congestive heart failure     2. Paroxysmal atrial fibrillation     3. Essential hypertension     4. NICM (nonischemic cardiomyopathy)     5. Pacemaker         Cardiomyopathy:   My impression is that, Mr. Hashem has nonischemic cardiomyopathy, chronic systolic/diastolic heart failure ACC/AHA stage C, class III with improved EF and currently reports NYHA Class III level of symptoms.      Mild decompensated heart failure, minimal extra volume on board.  Having occasional dizziness upon standing.  He will likely have new dry weight, given his recent extreme fasting and regain of weight.  I do not believe he is far from his dry weight at this time.    Although EF is now up to 35%, he does have a history of cardiomyopathy and given his significant NYHA class and fatigue, do recommend reinstating goal-directed heart failure medical therapy due to its cardioprotective properties.      In regards to OMM:  Beta-blocker: yes  ACE-I/ARB/ARNI: yes  Aldosterone antagonist: no    Hydralazine/nitrate: no   Digoxin: no    Medication changes:  Stop Toprol-XL, start Coreg 6.25 mg twice a day.  Increase lisinopril to 20 mg daily.  We will try to initiate aldosterone blocker on next visit.  Continue with maintenance dose of Lasix for now, on days when he feels dizzy he can hold the Lasix.      Counseling:  -I have encouraged pt to continue to follow a <2gm/day Na restriction and limit fluid intake to < 2L/day.  -I have requested pt perform daily wts and contact our office for increase of >2-3lbs in 1 day or 5lbs in 1 week.   -I have encouraged pt to engage in aerobic activity as tolerated and avoid heavy weight lifting.   -Smoking/Alcohol: I have advised pt to  abstain from any tobacco use and excessive alcohol intake.    -Weight: Obesity is a significant risk for both short and long-term morbidity and mortality for AHF patients.  I have stressed the importance of weight control and dietary management.         Electronically signed by:  Faythe Ghee, MD  [facility]  [address], [place]  [place], [place] [address]  [phone]    [facility] - [facility]  [address]  [place],  [address]  [phone]

## 2018-04-23 ENCOUNTER — Ambulatory Visit: Payer: 59 | Admitting: Internal Medicine

## 2018-05-01 ENCOUNTER — Telehealth: Payer: Self-pay

## 2018-05-01 NOTE — Telephone Encounter (Signed)
Requested ov, labs from pcp

## 2018-05-04 ENCOUNTER — Telehealth: Payer: Self-pay

## 2018-05-04 NOTE — Telephone Encounter (Signed)
Notes and labs received and in chart.

## 2018-05-04 NOTE — Telephone Encounter (Signed)
Patient called back to verify appt date/time tomorrow. He was given the option for telehealth or appt, he chose appt.

## 2018-05-04 NOTE — Telephone Encounter (Signed)
Attempted to contact patient, someone picked up but no one spoke. I attempted to speak to whoever answered, with no response.

## 2018-05-05 ENCOUNTER — Ambulatory Visit (HOSPITAL_BASED_OUTPATIENT_CLINIC_OR_DEPARTMENT_OTHER)
Admission: RE | Admit: 2018-05-05 | Discharge: 2018-05-05 | Disposition: A | Discharge status: Home / Self Care | Payer: 59 | Source: Ambulatory Visit | Admitting: Internal Medicine

## 2018-05-05 VITALS — BP 126/88 | HR 80 | Resp 16 | Wt 301.2 lb

## 2018-05-05 DIAGNOSIS — I428 Other cardiomyopathies: Secondary | ICD-10-CM

## 2018-05-05 DIAGNOSIS — I5042 Chronic combined systolic (congestive) and diastolic (congestive) heart failure: Secondary | ICD-10-CM

## 2018-05-05 DIAGNOSIS — I10 Essential (primary) hypertension: Secondary | ICD-10-CM

## 2018-05-05 DIAGNOSIS — Z950 Presence of cardiac pacemaker: Secondary | ICD-10-CM

## 2018-05-05 LAB — I-STAT CHEM 8 CARTRIDGE
Anion Gap I-Stat: 15 (ref 7.0–16.0)
BUN I-Stat: 26 mg/dL — ABNORMAL HIGH (ref 7–22)
Calcium Ionized I-Stat: 5 mg/dL (ref 4.35–5.10)
Chloride I-Stat: 102 mMol/L (ref 98–110)
Creatinine I-Stat: 1 mg/dL (ref 0.80–1.30)
EGFR: 88 mL/min/{1.73_m2} (ref 60–150)
Glucose I-Stat: 271 mg/dL — ABNORMAL HIGH (ref 71–99)
Hematocrit I-Stat: 43 % (ref 39.0–52.5)
Hemoglobin I-Stat: 14.6 gm/dL (ref 13.0–17.5)
Potassium I-Stat: 4.2 mMol/L (ref 3.5–5.3)
Sodium I-Stat: 137 mMol/L (ref 136–147)
TCO2 I-Stat: 26 mMol/L (ref 24–29)

## 2018-05-05 MED ORDER — SPIRONOLACTONE 25 MG PO TABS
25.0000 mg | ORAL_TABLET | Freq: Every day | ORAL | 1 refills | Status: DC
Start: 2018-05-05 — End: 2018-10-02

## 2018-05-05 NOTE — Patient Instructions (Addendum)
Heart failure causes many symptoms.   Some of them are more serious than others.   It is important to recognize when you should call 911 for emergency help and when you should call your doctor or nurse for urgent attention.    The following lists can help you decide what to do when you experience certain symptoms. You may want to print this page and keep it in a convenient spot.    Emergency Symptoms of Heart Failure  Call 911 for emergency help, if you have:    -Chest discomfort or pain that lasts more than 15 minutes that is not relieved with rest or nitroglycerin.  -Severe, persistent shortness of breath.  -Fainted or passed out.    Urgent Symptoms of Heart Failure  Call your doctor immediately, if you have any of the following symptoms:    -Increasing shortness of breath or a new shortness of breath while resting.  -Trouble sleeping due to difficulty breathing. For example, waking up suddenly at night due to difficulty breathing.  -A need to sleep sitting up or on more pillows than usual.  -Fast or irregular heart beats, palpitations, or a racing heart that persists and makes you feel dizzy or lightheaded.    Or if you:  -Cough up frothy or pink sputum.  -Feel like you may faint or pass out.    To manage your heart failure, please follow the following recommendations:    Fluid restriction:  You should follow a daily fluid restriction not to exceed 2 Liters daily (equivalent to 64 fluid ounces). This includes all things liquid at room temperature such as: all beverages, soup, ice cream, popsicles, jello and ice (you will need to guess how much the ice would be when melted and add toward your daily total). Keep track of your fluid intake throughout the day and measure everything.     Daily weights:  Weigh yourself every day when you wake up, right after using the bathroom. Keep a record of your daily weights and call our office if you gain more than 2-3 pounds in 1 day or 5 pounds in less than a week.     Low  sodium diet:  Sodium can lead to fluid retention and patients with heart failure should follow the low sodium diet, do not exceed 2000 mg sodium daily. Do not cook with salt or add it at the table. This includes table salt, sea salt, Kosher salt, garlic salt, seasoned salt or other seasoning's with salt (read the nutrition label). Avoid eating out at restaurants and high sodium foods such as processed meats, pre-packaged meals, pickles, soy sauce, chips, canned soups and bacon. Ask your healthcare provider for more information.    Salt Substitutes   When you eat sodium, your body dilutes it with water. This causes water retention, high blood pressure and a tough job for your heart! Limiting salt will give your heart a well-deserved break but you dont have to sacrifice taste! Salt substitutes enhance the flavor of food without adding sodium. Replace your salt shaker with herbs & spices to support the health of your heart.    Potassium-Salt versus Sodium Salt-Substitutes  Some salt substitutes use Potassium Chloride instead of Sodium Chloride. For some, extra potassium is a bad idea because too much may cause heart rhythm problems. Never use a salt substitute with potassium chloride without first checking with your health care provider.     Contain Potassium:  - Nu-Salt by Sweet & Low   -  NoSalt  - Morton Salt Substitute  - AlsoSalt    No Potassium or Sodium:  - Mrs. Dash (in 10+ flavors)  - [place] No Added Salt (in 4 flavors)  - Emerils No Salt (only 1 flavor: Essence [place])  - Sport and exercise psychologist by [employer] (in 7 flavors)  Herbs & Spices  - lemon or lime juice  - flavored vinegar  - thyme   - basil   - oregano  - sage  - nutmeg   - black pepper  - cayenne pepper  - paprika   - ginger   - cumin  - onion powder  - mace   - celery seed  - garlic powder  - fresh garlic  - curry powder  - mustard seed  - parsley flakes  - cinnamon  - bay leaf     Other Salt-Free Ideas  Season or marinate  meat, poultry, and fish ahead of time with onion, garlic, and your favorite herbs before cooking to bring out the flavor.    Activity:  We encourage you to engage in regular physical activity, such as daily walking. Start with short distances and walk at a comfortable pace. You should be able to "walk and talk". Gradually increase the distance and time that you walk. Exercise will help you feel better and can relieve the symptom of "heavy, tired legs". Stop exercise if you have chest pain, dizziness, or get so short of breath that you can not talk.   You should not lift/push/or pull heavy objects (anything heavy enough to make you strain).     Medications:  Take all your medications as prescribed by your health care provider and bring all your medication bottles to every appointment. It is important that you know what you are taking, when, and for what reason.    Medications you should not take:  Nonsteroidal anti-inflammatory drugs (NSAIDS): such as ibuprofen, Advil, Motrin, naprosyn, Naproxen, may cause fluid retention. You should not take these medications.    Call your healthcare provider if you are not feeling well or have new problems. We will call you back within 24 hours (Monday-Friday). In case of an emergency, call 911 or go to the nearest emergency department.      Thank you for choosing the Advanced Heart Failure Center at [facility] with [facility] for your care. It is important to keep all your medical appointments, even if you feel well. To reschedule or cancel an appointment with our clinic, call [phone]. If you need to leave a message on the nurse line for the heart failure clinic, please call [phone] or [phone].        Please start Aldactone 25 mg daily, you can stop taking daily potassium supplement.  But do take a potassium pill if you take extra dose of Lasix.  Continue with other medications.

## 2018-05-05 NOTE — Progress Notes (Signed)
I had the pleasure of seeing [name] today at [facility] for an advanced heart failure follow up visit.  Follows with Dr. [name] at [facility] cardiology.    Samuel Koch is a 48 y.o. male with nonischemic cardiomyopathy, chronic combined systolic and diastolic heart failure, latest EF 50%, NYHA Class 3, ACC/AHA Stage C.  Has dual-chamber pacemaker in place.  Status post PVC ablation.  Also uncontrolled type II diabetic, obesity.  Recent hospital visit and discharged on March 30, 2018, at what appeared to be a false positive nuclear stress test, cath with normal coronary arteries.    Travels often and to miss appointments and subspecialty clinics, and also tends not to take insulin when he is on mission trips, participates in multi week fast where he can fluctuate 30 to 40 pounds in weight.     After last visit, he was complaining of significant weakness while he used to build to walk many miles previously, sometimes he gets tired walking 1-2 blocks.  Overall NYHA class III symptoms.  He has been restarted on some heart failure medical therapy, currently not on aldosterone blocker yet.  He was feeling fatigue on beta-blocker, after last visit switch to Coreg 6.25 mg twice a day, lisinopril was increased to 20 mg daily.  He endorses that he is feeling little bit better, slightly more energy but still overall tired.  Occasional chest pains once or twice a week.  At a point he did gain 6 pounds in 2 days, up to 313 pounds at home scale but then increased is Lasix to 80 mg daily until the weight came down, then resume 40 mg daily as instructed.  Currently at 301 pounds, 5 pounds less than last visit weight.    I-STAT lab work today shows potassium 4.2 creatinine 1.0 bicarb 26.     He denies any palpitations, syncope, or PND. He has had no chest pain or pressure.  His appetite has been okay and he denies any early satiety, nausea, vomiting or diarrhea.  He is able to climb 1  flights of stairs and is not exercising regularly.     Review of Systems   Constitution: Negative for appetite change,  fever, Positive for activity change and fatigue and unexpected weight change  HENT: Negative for trouble swallowing, and voice change  Eyes: Negative for any visual disturbances  Respiratory: Positive for sob  Cardiovascular: See above  Gastrointestinal: Negative for abdominal pain, blood in stool, nausea, and vomiting Positive for bloating   Endocrine: Negative for cold intolerance, heat intolerance, and polydipsia  GU: Negative for hematuria  Musculoskeletal: Negative for gait problems, joint swelling, neck pain, and pain or heaviness in legs  Skin: Negative for rash, wound, and ulcers on legs/feet  Neurological: Negative for dizziness, headaches, and syncope  Hematologic: Negative for bruises  Psychiatric: Negative for anxiety, and sleep disturbance    Comprehensive review of systems performed by me. Unless otherwise noted, all systems are negative.      Most recent laboratory testing reveals:   LABORATORY DATA:  Lab Results   Component Value Date    BUN 20 03/27/2018    NA 136 03/27/2018    K 4.1 03/27/2018    CL 103 03/27/2018    CO2 28 03/27/2018     Lab Results   Component Value Date    WBC 6.3 03/27/2018    HGB 13.7 03/27/2018    HCT 41.0 03/27/2018    MCV 92 03/27/2018  PLT 210 03/27/2018       Medical Problems:  Patient Active Problem List    Diagnosis Date Noted    Chest pain 03/26/2018    NICM (nonischemic cardiomyopathy) 10/20/2017    Chronic combined systolic and diastolic HF (heart failure) 10/20/2017    Obesity, morbid, BMI 40.0-49.9 10/07/2017    DOE (dyspnea on exertion) 10/06/2017    Hyperglycemia 03/29/2017    Essential hypertension 03/29/2017    Combined systolic and diastolic congestive heart failure 03/29/2017    Dehydration, moderate 03/29/2017    Anemia, unspecified type 03/29/2017    Renal insufficiency 03/29/2017    Near syncope 03/28/2017       History from  Cardiology at [facility] [place]  1. 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   - LHC (6/17): 30% mid RCA, no other significant disease.   - Echo (6/17): EF 30-35%.   - Holter (7/17): 19% PVCs.   - Echo (3/18): EF 50-55%, moderate LVH, grade II diastolic dysfunction.   - CPX (3/18): submaximal, RER 0.99, VO2 20.8 (89% predicted), VE/VCO2 29 => no significant HF limitation.   - Echo (9/18): EF 50-55%  - echo 10/17/2017 ef 50%    2. Morbid obesity    3. Type II diabetes, uncontrolled    4. Hypertriglyceridemia    5. NSVT/PVCs:   - 48 hour holter (7/17) with 19% PVCs.  - PVC ablation in 8/17 but PVCs recurred.   - Holter (11/17) with rare PVCs, < 1%.   - Holter (9/18) with rare PVCs only    6. 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     7. Sleep study (1/18): Mild OSA - no cpap recommended  .   8. ABIs (3/18): Normal    9. Leg weakness with atorvastatin.     Past Medical History:   Diagnosis Date    Cardiomyopathy     Congestive heart failure     Diabetes mellitus     Hypertension     PVC's (premature ventricular contractions)      Past Surgical History:   Procedure Laterality Date    APPENDECTOMY      CARDIAC PACEMAKER PLACEMENT      RIGHT & LEFT HEART CATH  W/ CORONARY ANGIOS, LV/LA Right 03/30/2018    Procedure: RIGHT & LEFT HEART CATH  W/ CORONARY ANGIOS, LV/LA;  Surgeon: Santo Held, MD;  Location: [facility] CATH/EP;  Service: Cardiovascular;  Laterality: Right;     Family History   Problem Relation Age of Onset    Heart disease Father     Hypertension Father     Heart disease Sister     Heart attack Sister     Heart disease Brother     Diabetes Brother     Diabetes Paternal Grandmother      Social History     Socioeconomic History    Marital status: Married     Spouse name: Not on file    Number of children: Not on file    Years of education: Not on file    Highest education level: Not on file    Occupational History    Not on file   Social Needs    Financial resource strain: Not on file    Food insecurity:     Worry: Not on file     Inability: Not on file    Transportation needs:     Medical: Not on  file     Non-medical: Not on file   Tobacco Use    Smoking status: Former Smoker    Smokeless tobacco: Former Estate agent and Sexual Activity    Alcohol use: No    Drug use: No    Sexual activity: Not on file     Comment: Deferred   Lifestyle    Physical activity:     Days per week: Not on file     Minutes per session: Not on file    Stress: Not on file   Relationships    Social connections:     Talks on phone: Not on file     Gets together: Not on file     Attends religious service: Not on file     Active member of club or organization: Not on file     Attends meetings of clubs or organizations: Not on file     Relationship status: Not on file    Intimate partner violence:     Fear of current or ex partner: Not on file     Emotionally abused: Not on file     Physically abused: Not on file     Forced sexual activity: Not on file   Other Topics Concern    Not on file   Social History Narrative    Not on file     Married   Non smoker - isolated smoking before age 64  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      Current Outpatient Medications   Medication Sig Dispense Refill    atorvastatin (LIPITOR) 20 MG tablet Take 1 tablet (20 mg total) by mouth nightly 30 tablet 2    carvedilol (COREG) 6.25 MG tablet Take 1 tablet (6.25 mg total) by mouth 2 (two) times daily with meals 180 tablet 1    furosemide (LASIX) 40 MG tablet Take 1 tablet (40 mg total) by mouth as needed (weight gain) (Patient taking differently: Take 40 mg by mouth daily Pt taking daily  ) 30 tablet 1    insulin lispro, 1 Unit Dial, (HUMALOG KWIKPEN) 100 UNIT/ML Solution Pen-injector injection pen Inject 8 Unit into the skin 3 (three) times daily  before meals 3 mL 1    Insulin Pen Needle (PEN NEEDLES 3/16") 31G X 5 MM Misc 1 each by Does not apply route 4 (four) times daily 901 each 1    lisinopril (PRINIVIL,ZESTRIL) 20 MG tablet Take 1 tablet (20 mg total) by mouth daily 90 tablet 1    potassium chloride (K-TAB, KLOR-CON) 10 MEQ tablet Take 1 tablet (10 mEq total) by mouth daily 90 tablet 3     No current facility-administered medications for this encounter.      No Known Allergies    Vitals:  BP 126/88    Pulse 80    Resp 16    Wt 136.6 kg (301 lb 4 oz)    SpO2 100%    BMI 38.68 kg/m . Estimated body mass index is 38.68 kg/m as calculated from the following:    Height as of 04/06/18: 1.88 m (6\' 2" ).    Weight as of this encounter: 136.6 kg (301 lb 4 oz).  His weight is up 6 lbs office visit - up 10 lbs per recent weights?     Physical Exam:   General: overweight male, alert, NAD  HEENT:  Anicteric sclera, supple,  no significant JVD.  Lungs: CTA B/L, no rhonchi, wheezes or crackles with normal rate and effort  Cardiovascular: s1s2, normal rate, regular rhythm, no m/r/g  Abdominal: Soft, obese, nontender  Extremities: warm to touch, no edema  Neuromuscular exam: grossly non-focal, though not formally tested.    Assessment and Plan:  1. Chronic combined systolic and diastolic congestive heart failure     2. Essential hypertension     3. NICM (nonischemic cardiomyopathy)     4. Pacemaker         Cardiomyopathy:   My impression is that, Mr. Zee has nonischemic cardiomyopathy, chronic systolic/diastolic heart failure ACC/AHA stage C, class III with improved EF.  Was recently reinitiated neurohormonal heart medical therapy.    He is improving from heart failure standpoint, better compensated.  Unclear what her true dry weight is given his significant fasting in the recent past.  Likely under 300 pounds however.    Although EF is now up to 35%, he does have a history of cardiomyopathy and given his significant NYHA class and fatigue, do recommend continuing  to maximize goal-directed heart failure medical therapy due to its cardioprotective properties.    In regards to OMM:  Beta-blocker: yes  ACE-I/ARB/ARNI: yes  Aldosterone antagonist: no    Hydralazine/nitrate: no   Digoxin: no    Medication changes:  Please start Aldactone 25 mg daily, you can stop taking daily potassium supplement.  But do take a potassium pill if you take extra dose of Lasix.  Continue with other medications.      Counseling:  -I have encouraged pt to continue to follow a <2gm/day Na restriction and limit fluid intake to < 2L/day.  -I have requested pt perform daily wts and contact our office for increase of >2-3lbs in 1 day or 5lbs in 1 week.   -I have encouraged pt to engage in aerobic activity as tolerated and avoid heavy weight lifting.   -Smoking/Alcohol: I have advised pt to abstain from any tobacco use and excessive alcohol intake.    -Weight: Obesity is a significant risk for both short and long-term morbidity and mortality for AHF patients.  I have stressed the importance of weight control and dietary management.         Electronically signed by:  Faythe Ghee, MD  [facility] and Vascular [facility]  [address]  [place], [place] [address]  [phone]    [facility] - [facility]  [address]  [place], [place] [address]  [phone]

## 2018-05-07 ENCOUNTER — Encounter: Payer: 59 | Admitting: Acute Care

## 2018-05-08 NOTE — Telephone Encounter (Signed)
Updates requested in Care Everywhere.

## 2018-05-11 ENCOUNTER — Telehealth: Payer: 59 | Admitting: Internal Medicine

## 2018-05-11 ENCOUNTER — Encounter: Payer: Self-pay | Admitting: Internal Medicine

## 2018-05-11 VITALS — BP 139/89 | HR 71 | Ht 74.0 in | Wt 300.0 lb

## 2018-05-11 DIAGNOSIS — R002 Palpitations: Secondary | ICD-10-CM

## 2018-05-11 DIAGNOSIS — I429 Cardiomyopathy, unspecified: Secondary | ICD-10-CM

## 2018-05-11 NOTE — Progress Notes (Signed)
Cardiology Follow Up via Telehealth      Patient Name: Samuel Koch   Date of Birth: [birth year]    Provider: Abel Presto, DO     Patient Care Team:  Tandy Gaw, MD as PCP - General (Family Medicine)  Abel Presto, DO as Consulting Physician (Cardiology)  Elizbeth Squires, NP as Nurse Practitioner (Family Nurse Practitioner)  Joneen Roach, PA as Physician Assistant (Physician Assistant)  Tandy Gaw, MD as Consulting Physician (Family Medicine)    Chief Complaint: VIRTUAL CHECK IN      This is a telehealth visit which was conducted with the use of interactive HIPPA compliant audio only, video option unavailable to patient telecommunication that permitted real time communication between Samuel Koch [facility] and myself.     He consented to participation and received services at home, while I was located at [facility] Cardiology & Vascular Medicine Office.     This visit was changed from an in-person visit to a telehealth visit to lower the risk of exposure and / or spread of the current pandemic with the SARS CoV-2 virus. This is based on the guidelines from the [facility] and other health agencies.    Present during Televisit:    Scribe: Jacky Michca Leupeu    History of Present Illness   Samuel Koch is a 48 y.o. male being evaluated today via telecommunication.    Pleasant gentleman with a history combined systolic and diastolic CHF, NICM, bradycardia, and PVCs. He is s/p PVC ablation 2017 in [place] He was admitted to the hospital from 03/26/2018 -03/30/2018 for chest pain. He had a lexiscan stress test that was read as low risk but showed a concerning area of possible ischemia. He then had right and left heart cath completed showing non-obstructive normal coronary arteries. Echocardiogram was also completed 03/27/2018 showing improvement in his LVEF now of 50%.     He has a dual-chamber pacemaker that was implanted for sick sinus syndrome. Device check from  04/06/2018 demonstrates RA pacing 46.7%, RV pacing <0.1%, 83 ST events recorded, and 7.5 years of remaining battery life.    He complains episodes of dizziness, perfuse sweating, chest pain and shortness of breath. He also feels very fatigued and is done with his day at exactly 3 pm due to extreme weakness. He states he had a  history of a walk 4 miles a day, now is having difficulty getting up and walking 1 block. He reports his symptoms definitely worsened with exertion.    He states that from November through December, he lost 30 pounds and then gained some weight back up. A week ago he put on on 10 pounds and took an extra dose of Lasix and drop down from 306  to 300 pounds.     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    He has been tested for sleep apnea but his result was negative.    He asked about possibly undergoing bariatric surgery for weight loss.     Review of Systems     Constitution: Negative for activity change, appetite change, fatigue, fever, and unexpected weight change  HENT: Negative for trouble swallowing, and voice change  Eyes: Negative for any visual disturbances  Respiratory: Negative for apnea and/or awakening with sob, chest tightness, and sob  Cardiovascular: Negative for chest pain/discomfort, leg or ankle swelling, difficulty breathing lying down, and palpitations     Comprehensive review of systems performed by  me. Unless otherwise noted, all systems are negative      Past Medical History     Past Medical History 05/07/18 DL-    1. Chronic combined systolic and diastolic CHF  2. Nonischemic cardiomyopathy  A. LHC (6/17): 30% mid RCA, no other significant disease.   B. Echo (6/17): EF 30-35%.   C. Holter (7/17): 19% PVCs.   D. Echo (3/18): EF 50-55%, moderate LVH, grade II diastolic dysfunction.   E. Echo (9/18): EF 50-55%  F. Echo 10/07/17- LV cavity size is increased. The LVEF is est. at 50%. Mild hypokinesis of the distal septum and apex which may be due to paced  rhythm. Pacer wire present in RV.     3. Type II diabetes  4. Hypertriglyceridemia  5. PVCs  A. 48 hour holter (7/17) with 19% PVCs.  B. PVC ablation in 8/17 but PVCs recurred. Will   C. Holter (11/17) with rare PVCs, < 1%.   D. Holter (9/18) with rare PVCs only  6. Bradycardia/ SSS  A. Dual chamber pacemaker placed (Medtronic, MRI compatible).     7. NSVT  8. Acute Coronary Syndrome   A. Echo 03/27/2018- Technically difficult study with limited endocardial definition and structural resolution in the apical views, Intravenous contrast was administered to enhance endocardial definition, LV cavity size is norma, mild concentric left ventricular hypertrophy, LV systolic function is at the lower limits of normal, EF 50%, no regional wall motion abnormalities present, LV diastolic filling pattern is probably pseudonormalized, consistent with grade 2 left ventricular diastolic dysfunction and elevated left atrial pressure, Right-sided chambers are not optimally visualized but appear grossly normal in size, Pacemaker lead is visualized within the right heart chamber, no significant valvular heart disease.   B. Stress 03/28/2018- Completed vasodilator protocol., No ischemic changes with infusion, No arrhythmias, Overall, low risk vasodilator stress rest SPECT perfusion study, a large moderate to severe perfusion defect involving the apex, inferior stripe, mid and basal inferoseptal and inferolateral segments with mild reversibility suggestive of possible ischemia but with likely strong component of diaphragmatic attenuation artifact, Also anteroseptal perfusion defect from attenuation artifact, No significant findings of ischemia Noted, Dilated LV with mildly decreased left ventricular function, stress EF 44%.   C. THC 03/30/2018- Negative heart cath.     Labs  04/14/18- WBC: 7.5, RBC: 4.60, HGB: 13.6, HCT: 41.1, PLT: 250, GLUC: 197, BUN: 20, CREAT: 0.92, GFR: 97, NA: 139, K: 4.5, CA: 9.1, CHOL: 160, TRIG: 119, HDL: 47, LDL:  89, A1C: 8.5, BNP: 7.1    Device check from 04/06/2018 demonstrates RA pacing 46.7%, RV pacing <0.1%, 83 ST events recorded, and 7.5 years of remaining battery life.    ED visit 03/26/2018 with CP and SOB    Past Surgical History     Past Surgical History:   Procedure Laterality Date    APPENDECTOMY      CARDIAC PACEMAKER PLACEMENT      RIGHT & LEFT HEART CATH  W/ CORONARY ANGIOS, LV/LA Right 03/30/2018    Procedure: RIGHT & LEFT HEART CATH  W/ CORONARY ANGIOS, LV/LA;  Surgeon: Santo Held, MD;  Location: [facility] CATH/EP;  Service: Cardiovascular;  Laterality: Right;       Family History     Family History   Problem Relation Age of Onset    Heart disease Father     Hypertension Father     Heart disease Sister     Heart attack Sister     Heart disease Brother  Diabetes Brother     Diabetes Paternal Grandmother        Social History     Social History     Tobacco Use    Smoking status: Former Smoker    Smokeless tobacco: Former Estate agent Use Topics    Alcohol use: No    Drug use: No       Allergies     has No Known Allergies.    Medications       Current Outpatient Medications:     atorvastatin (LIPITOR) 20 MG tablet, Take 1 tablet (20 mg total) by mouth nightly, Disp: 30 tablet, Rfl: 2    carvedilol (COREG) 6.25 MG tablet, Take 1 tablet (6.25 mg total) by mouth 2 (two) times daily with meals, Disp: 180 tablet, Rfl: 1    furosemide (LASIX) 40 MG tablet, Take 40 mg by mouth daily, Disp: , Rfl:     insulin lispro, 1 Unit Dial, (HUMALOG KWIKPEN) 100 UNIT/ML Solution Pen-injector injection pen, Inject 8 Unit into the skin 3 (three) times daily before meals, Disp: 3 mL, Rfl: 1    Insulin Pen Needle (PEN NEEDLES 3/16") 31G X 5 MM Misc, 1 each by Does not apply route 4 (four) times daily, Disp: 901 each, Rfl: 1    lisinopril (PRINIVIL,ZESTRIL) 20 MG tablet, Take 1 tablet (20 mg total) by mouth daily, Disp: 90 tablet, Rfl: 1    potassium chloride (K-TAB, KLOR-CON) 10 MEQ tablet, Take 1 tablet (10 mEq  total) by mouth daily, Disp: 90 tablet, Rfl: 3    spironolactone (ALDACTONE) 25 MG tablet, Take 1 tablet (25 mg total) by mouth daily, Disp: 90 tablet, Rfl: 1    Vitals (provided by patient)     Visit Vitals  BP 139/89 (BP Site: Left arm, Patient Position: Sitting)   Pulse 71   Ht 1.88 m (6\' 2" )   Wt 136.1 kg (300 lb)   BMI 38.52 kg/m     Vitals:    05/11/18 1027   BP: 139/89   BP Site: Left arm   Patient Position: Sitting   Pulse: 71   Weight: 136.1 kg (300 lb)   Height: 1.88 m (6\' 2" )     Wt Readings from Last 3 Encounters:   05/11/18 136.1 kg (300 lb)   05/05/18 136.6 kg (301 lb 4 oz)   04/21/18 138.8 kg (306 lb)       Labs     Lab Results   Component Value Date/Time    WBC 6.3 03/27/2018 01:00 PM    RBC 4.47 03/27/2018 01:00 PM    HGB 13.7 03/27/2018 01:00 PM    HCT 41.0 03/27/2018 01:00 PM    PLT 210 03/27/2018 01:00 PM       Lab Results   Component Value Date/Time    NA 136 03/27/2018 01:00 PM    K 4.1 03/27/2018 01:00 PM    CL 103 03/27/2018 01:00 PM    CO2 28 03/27/2018 01:00 PM    GLU 165 (H) 03/27/2018 01:00 PM    BUN 20 03/27/2018 01:00 PM    CREAT 1.00 05/05/2018 02:38 PM    CREAT 0.95 03/27/2018 01:00 PM    PROT 7.2 10/06/2017 08:18 PM    ALKPHOS 89 10/06/2017 08:18 PM    AST 13 10/06/2017 08:18 PM    ALT 19 10/06/2017 08:18 PM       Lab Results   Component Value Date/Time    CHOL 201 (H) 03/26/2018 04:24  PM    TRIG 228 (H) 03/26/2018 04:24 PM    HDL 47 03/26/2018 04:24 PM    LDL 108 03/26/2018 04:24 PM       Lab Results   Component Value Date/Time    HGBA1CPERCNT 8.2 03/26/2018 04:24 PM       Impression and Recommendations:     1. Cardiomyopathy, unspecified type  - Echocardiogram Adult Complete W Clr/ Dopp Waveform; Future  - Office - Interrogation Device Evaluation; Standing    2. Palpitations  - Office - Designer, television/film set; Standing    1. Chronic combined systolic and diastolic congestive heart failure. Currently with NYHA Class II-III symptoms. Last echo showed improved EF 50%. Given  his episodes of dizziness, perfuse sweating, chest pain and shortness of breath, he will start Lasix 40 mg daily for a week. Repeat echo at the end of April. Continue current medical therapy.    2. H/o of PVCs. S/p ablation in 2018. Certainly decrease frequency of PVCs at this point. Good control.    3. SSS: Dual chamber PM in place. Normal functionality. Scheduled remote check this week.    4. Bariatric surgery. We discussed the possibly of being a good option later on.     5. I will see him back in 4 months.      Respectfully,   Abel Presto, DO  05/11/2018  Electronically signed by:  Abel Presto, DO    This note was scribed by Emmaline Kluver on behalf of Abel Presto, DO

## 2018-05-12 ENCOUNTER — Other Ambulatory Visit: Payer: Self-pay | Admitting: Family Nurse Practitioner

## 2018-05-18 ENCOUNTER — Telehealth: Payer: Self-pay

## 2018-05-18 NOTE — Telephone Encounter (Signed)
Pt's wife, Kensley Lares, left a voicemail with phone triage this afternoon stating that Mr. Reister extreme fatigue and dizziness persist.  He now does have periodic episodes of disorientation.    Called Mrs. Diffee back, no answer, LMTCB.  [phone]    FYI, last OV was a telehealth visit with Dr. [name] on 05/11/18.  Dr. [name] prescribed Lasix 40 mg daily x 1 week.  Pt is scheduled to see Dr. Mellody Drown at [facility] on 4/20.    Whitney, just an fyi for Dr. [name].    Kandice Moos, RN

## 2018-05-19 ENCOUNTER — Telehealth: Payer: Self-pay | Admitting: Internal Medicine

## 2018-05-19 ENCOUNTER — Other Ambulatory Visit: Payer: Self-pay | Admitting: Medical

## 2018-05-19 NOTE — Telephone Encounter (Signed)
Report in Epic. BDC

## 2018-05-19 NOTE — Telephone Encounter (Signed)
Spoke with Mrs. Goins who stated he is weak and fatigued. Denies weight gain. States since the start of Lasix 40 mg  he has lost 1 lb but symptoms have not improved since LOV on 4/6 with Dr. [name]. Patient did not have a BP reading. Advised to obtain BP and send in transmission for further review.     Device Techs please let me know once received so I can further assess how to proceed with patient.     Thank you,     Alphonzo Lemmings, RN

## 2018-05-19 NOTE — Telephone Encounter (Signed)
Left message to call back need to schedule urgent Echo per ADV, Please forward call.

## 2018-05-19 NOTE — Telephone Encounter (Signed)
Candy,     Can we schedule his echo soon? This week if possible. Patient is symptomatic and has appt with Dr. Mellody Drown in the HF clinic Monday 4/20. Thank you.     Alphonzo Lemmings, RN

## 2018-05-20 NOTE — Progress Notes (Signed)
Cardiovascular Imaging [facility]    Limited Echo    Patient name: Abshir Paolini [id number]   Gender: male    Test Date: 05/22/18  DOB: 1970-10-15    Age: 48 y.o.    BP:         Height:  6'2          Weight:    300   lbs  Indication: CM    Ordering Physician: [name]            Primary: Tandy Gaw, MD    Sonographer: Valentino Saxon      Risk Factors:  HTN   PHTN   HPL   Dyslipidemia   CP   CAD/CABG/STENT   NSTEMI   Prior MI     Tachy/Brady   CKD   Murmur   GERD   SOB/DOE/COPD/Asthma/OSA  Edema    Fatigue   Anemia   Hypothyroid   SSS   AFIB/flutter   SVT/VT   PVCS/PAC    Near/ Syncope  Palpitations  ABN EKG  RBBB/LBBB/CHB  Dialted AO Root    AAA w/Repair  Thoracic Ao W/repair  ASD/ASD Repair VSD/ VSD Repair  PFO    Tetralogy of Fallot  Ebstein's  Marfan's  Ross Procedure  Obese  Diabetes    Former/Smoker/Tobacco use    Effusion: Pleural/Pericardial   Heart Failure: Diastolic/systolic/Unspecified    Devices:  Pacemaker/Defibrillator  Cardiomyopathy: Ischemic  NICM  Dilated   Hypertrophic/HOCM  Mitral Valve:  MV Repair  MV replacement MV Clip/Ring  MR MS  MVP  Tricuspid Valve: TV Repair   TV Replacement    Tricuspid Regurgitation      Aortic Valve: AS        AI         BIC. AV    Aortic Valve Repair    s/p TAVR   AVR  Pulmonic Valve:    PI     PS

## 2018-05-22 ENCOUNTER — Telehealth: Payer: Self-pay

## 2018-05-22 ENCOUNTER — Ambulatory Visit: Payer: 59

## 2018-05-22 DIAGNOSIS — I428 Other cardiomyopathies: Secondary | ICD-10-CM

## 2018-05-22 DIAGNOSIS — I10 Essential (primary) hypertension: Secondary | ICD-10-CM

## 2018-05-22 DIAGNOSIS — R5383 Other fatigue: Secondary | ICD-10-CM

## 2018-05-22 DIAGNOSIS — R739 Hyperglycemia, unspecified: Secondary | ICD-10-CM

## 2018-05-22 DIAGNOSIS — I480 Paroxysmal atrial fibrillation: Secondary | ICD-10-CM

## 2018-05-22 DIAGNOSIS — I5042 Chronic combined systolic (congestive) and diastolic (congestive) heart failure: Secondary | ICD-10-CM

## 2018-05-22 DIAGNOSIS — N289 Disorder of kidney and ureter, unspecified: Secondary | ICD-10-CM

## 2018-05-22 DIAGNOSIS — R55 Syncope and collapse: Secondary | ICD-10-CM

## 2018-05-22 DIAGNOSIS — I429 Cardiomyopathy, unspecified: Secondary | ICD-10-CM

## 2018-05-22 DIAGNOSIS — R0609 Other forms of dyspnea: Secondary | ICD-10-CM

## 2018-05-22 DIAGNOSIS — E860 Dehydration: Secondary | ICD-10-CM

## 2018-05-22 DIAGNOSIS — D649 Anemia, unspecified: Secondary | ICD-10-CM

## 2018-05-22 NOTE — Telephone Encounter (Signed)
LVM with pt to remind them of his appointment with [place] on Monday at 2:30pm. I advised him to call back if he needs to reschedule.     Marland Kitchen, CNA

## 2018-05-25 ENCOUNTER — Encounter: Payer: Self-pay | Admitting: Internal Medicine

## 2018-05-25 ENCOUNTER — Ambulatory Visit
Admission: RE | Admit: 2018-05-25 | Discharge: 2018-05-25 | Disposition: A | Discharge status: Home / Self Care | Payer: 59 | Source: Ambulatory Visit | Attending: Family Nurse Practitioner | Admitting: Family Nurse Practitioner

## 2018-05-25 VITALS — BP 109/73 | HR 82 | Wt 304.0 lb

## 2018-05-25 DIAGNOSIS — Z950 Presence of cardiac pacemaker: Secondary | ICD-10-CM

## 2018-05-25 DIAGNOSIS — N289 Disorder of kidney and ureter, unspecified: Secondary | ICD-10-CM | POA: Insufficient documentation

## 2018-05-25 DIAGNOSIS — I428 Other cardiomyopathies: Secondary | ICD-10-CM

## 2018-05-25 DIAGNOSIS — R5383 Other fatigue: Secondary | ICD-10-CM | POA: Insufficient documentation

## 2018-05-25 DIAGNOSIS — E860 Dehydration: Secondary | ICD-10-CM | POA: Insufficient documentation

## 2018-05-25 DIAGNOSIS — I480 Paroxysmal atrial fibrillation: Secondary | ICD-10-CM | POA: Insufficient documentation

## 2018-05-25 DIAGNOSIS — I5042 Chronic combined systolic (congestive) and diastolic (congestive) heart failure: Secondary | ICD-10-CM

## 2018-05-25 DIAGNOSIS — R0609 Other forms of dyspnea: Secondary | ICD-10-CM | POA: Insufficient documentation

## 2018-05-25 DIAGNOSIS — R739 Hyperglycemia, unspecified: Secondary | ICD-10-CM | POA: Insufficient documentation

## 2018-05-25 DIAGNOSIS — D649 Anemia, unspecified: Secondary | ICD-10-CM | POA: Insufficient documentation

## 2018-05-25 DIAGNOSIS — I10 Essential (primary) hypertension: Secondary | ICD-10-CM | POA: Insufficient documentation

## 2018-05-25 DIAGNOSIS — R55 Syncope and collapse: Secondary | ICD-10-CM | POA: Insufficient documentation

## 2018-05-25 LAB — I-STAT CHEM 8 CARTRIDGE
Anion Gap I-Stat: 16 (ref 7.0–16.0)
BUN I-Stat: 28 mg/dL — ABNORMAL HIGH (ref 7–22)
Calcium Ionized I-Stat: 4.9 mg/dL (ref 4.35–5.10)
Chloride I-Stat: 97 mMol/L — ABNORMAL LOW (ref 98–110)
Creatinine I-Stat: 1 mg/dL (ref 0.80–1.30)
EGFR: 87 mL/min/{1.73_m2} (ref 60–150)
Glucose I-Stat: 302 mg/dL — ABNORMAL HIGH (ref 71–99)
Hematocrit I-Stat: 44 % (ref 39.0–52.5)
Hemoglobin I-Stat: 15 gm/dL (ref 13.0–17.5)
Potassium I-Stat: 4.5 mMol/L (ref 3.5–5.3)
Sodium I-Stat: 133 mMol/L — ABNORMAL LOW (ref 136–147)
TCO2 I-Stat: 25 mMol/L (ref 24–29)

## 2018-05-25 MED ORDER — METOPROLOL SUCCINATE ER 50 MG PO TB24
50.00 mg | ORAL_TABLET | Freq: Every evening | ORAL | 2 refills | Status: DC
Start: 2018-05-25 — End: 2018-10-28

## 2018-05-25 MED ORDER — FUROSEMIDE 10 MG/ML IJ SOLN
80.00 mg | Freq: Once | INTRAMUSCULAR | Status: AC
Start: 2018-05-25 — End: 2018-05-25
  Administered 2018-05-25: 15:00:00 80 mg via INTRAVENOUS
  Filled 2018-05-25: qty 8

## 2018-05-25 NOTE — Patient Instructions (Addendum)
Heart failure causes many symptoms.   Some of them are more serious than others.   It is important to recognize when you should call 911 for emergency help and when you should call your doctor or nurse for urgent attention.    The following lists can help you decide what to do when you experience certain symptoms. You may want to print this page and keep it in a convenient spot.    Emergency Symptoms of Heart Failure  Call 911 for emergency help, if you have:    -Chest discomfort or pain that lasts more than 15 minutes that is not relieved with rest or nitroglycerin.  -Severe, persistent shortness of breath.  -Fainted or passed out.    Urgent Symptoms of Heart Failure  Call your doctor immediately, if you have any of the following symptoms:    -Increasing shortness of breath or a new shortness of breath while resting.  -Trouble sleeping due to difficulty breathing. For example, waking up suddenly at night due to difficulty breathing.  -A need to sleep sitting up or on more pillows than usual.  -Fast or irregular heart beats, palpitations, or a racing heart that persists and makes you feel dizzy or lightheaded.    Or if you:  -Cough up frothy or pink sputum.  -Feel like you may faint or pass out.    To manage your heart failure, please follow the following recommendations:    Fluid restriction:  You should follow a daily fluid restriction not to exceed 2 Liters daily (equivalent to 64 fluid ounces). This includes all things liquid at room temperature such as: all beverages, soup, ice cream, popsicles, jello and ice (you will need to guess how much the ice would be when melted and add toward your daily total). Keep track of your fluid intake throughout the day and measure everything.     Daily weights:  Weigh yourself every day when you wake up, right after using the bathroom. Keep a record of your daily weights and call our office if you gain more than 2-3 pounds in 1 day or 5 pounds in less than a week.     Low  sodium diet:  Sodium can lead to fluid retention and patients with heart failure should follow the low sodium diet, do not exceed 2000 mg sodium daily. Do not cook with salt or add it at the table. This includes table salt, sea salt, Kosher salt, garlic salt, seasoned salt or other seasoning's with salt (read the nutrition label). Avoid eating out at restaurants and high sodium foods such as processed meats, pre-packaged meals, pickles, soy sauce, chips, canned soups and bacon. Ask your healthcare provider for more information.    Salt Substitutes   When you eat sodium, your body dilutes it with water. This causes water retention, high blood pressure and a tough job for your heart! Limiting salt will give your heart a well-deserved break but you dont have to sacrifice taste! Salt substitutes enhance the flavor of food without adding sodium. Replace your salt shaker with herbs & spices to support the health of your heart.    Potassium-Salt versus Sodium Salt-Substitutes  Some salt substitutes use Potassium Chloride instead of Sodium Chloride. For some, extra potassium is a bad idea because too much may cause heart rhythm problems. Never use a salt substitute with potassium chloride without first checking with your health care provider.     Contain Potassium:  - Nu-Salt by Sweet & Low   -  NoSalt  - Morton Salt Substitute  - AlsoSalt    No Potassium or Sodium:  - Mrs. Dash (in 10+ flavors)  - [place] No Added Salt (in 4 flavors)  - Emerils No Salt (only 1 flavor: Essence [place])  - Sport and exercise psychologist by [employer] (in 7 flavors)  Herbs & Spices  - lemon or lime juice  - flavored vinegar  - thyme   - basil   - oregano  - sage  - nutmeg   - black pepper  - cayenne pepper  - paprika   - ginger   - cumin  - onion powder  - mace   - celery seed  - garlic powder  - fresh garlic  - curry powder  - mustard seed  - parsley flakes  - cinnamon  - bay leaf     Other Salt-Free Ideas  Season or marinate  meat, poultry, and fish ahead of time with onion, garlic, and your favorite herbs before cooking to bring out the flavor.    Activity:  We encourage you to engage in regular physical activity, such as daily walking. Start with short distances and walk at a comfortable pace. You should be able to "walk and talk". Gradually increase the distance and time that you walk. Exercise will help you feel better and can relieve the symptom of "heavy, tired legs". Stop exercise if you have chest pain, dizziness, or get so short of breath that you can not talk.   You should not lift/push/or pull heavy objects (anything heavy enough to make you strain).     Medications:  Take all your medications as prescribed by your health care provider and bring all your medication bottles to every appointment. It is important that you know what you are taking, when, and for what reason.    Medications you should not take:  Nonsteroidal anti-inflammatory drugs (NSAIDS): such as ibuprofen, Advil, Motrin, naprosyn, Naproxen, may cause fluid retention. You should not take these medications.    Call your healthcare provider if you are not feeling well or have new problems. We will call you back within 24 hours (Monday-Friday). In case of an emergency, call 911 or go to the nearest emergency department.      Thank you for choosing the Advanced [facility] Failure Center at [facility] with [facility] for your care. It is important to keep all your medical appointments, even if you feel well. To reschedule or cancel an appointment with our clinic, call [phone]. If you need to leave a message on the nurse line for the heart failure clinic, please call [phone] or [phone].                  IV Lasix push 80 mg today.      Remember to increase your Lasix dose to 80 mg in the morning, continue with 40 mg afternoon for at least the next 2 weeks.    Give [place] a call if you are having dizziness.  Stop carvedilol completely, start  Toprol-XL 50 mg at bedtime.  2-week follow-up here in the clinic.

## 2018-05-25 NOTE — Progress Notes (Signed)
[facility]      I had the pleasure of seeing Samuel Koch today at [facility] [place] for an advanced heart failure follow up visit.  Follows with Dr. [name] at [facility] cardiology.    Samuel Koch is a 48 y.o. male with nonischemic cardiomyopathy, chronic combined systolic and diastolic heart failure, latest EF 50%, NYHA Class 3, ACC/AHA Stage C.  Has dual-chamber pacemaker in place.  Status post PVC ablation.  Also uncontrolled type II diabetic, obesity.    As outpatient, we optimize his heart failure medical therapy, still complaining of significant fatigue.  Lately however, the fatigue is even worse, NYHA class IIIb symptoms, short of breath just with getting dressed.  Sometimes he has no energy at all we can even walk and lift his leg, therefore he wears the lightest shoes he has.  Currently not even able to walk up a few stairs without faculty.  He has not been out of the house, no exercise.  Started Aldactone 25 mg daily on 05/05/2018 with instructions not to take any extra potassium unless he is feeling cramps in his legs.  Complains of the more short of breath than usual, occasional chest pain still.  4 pound weight gain, currently about 304 pounds  Has been on carvedilol for some time, has not tried other beta-blockers.  Repeat echocardiogram showed improvement in left ventricular function is persistent, EF of 55 to 55% with normal-appearing right ventricle.    I-STAT lab work today shows bicarb 25, potassium 4.5, glucose elevated 3.2.  Creatinine stable at 1.0.      Review of Systems   Constitution: Negative for appetite change,  fever, Positive for activity change and fatigue and unexpected weight change  HENT: Negative for trouble swallowing, and voice change  Eyes: Negative for any visual disturbances  Respiratory: Positive for sob  Cardiovascular: See above  Gastrointestinal: Negative for abdominal pain, blood in stool, nausea, and  vomiting Positive for bloating   Endocrine: Negative for cold intolerance, heat intolerance, and polydipsia  GU: Negative for hematuria  Musculoskeletal: Negative for gait problems, joint swelling, neck pain, and pain or heaviness in legs  Skin: Negative for rash, wound, and ulcers on legs/feet  Neurological: Negative for dizziness, headaches, and syncope  Hematologic: Negative for bruises  Psychiatric: Negative for anxiety, and sleep disturbance    Comprehensive review of systems performed by me. Unless otherwise noted, all systems are negative.      Most recent laboratory testing reveals:   LABORATORY DATA:  Lab Results   Component Value Date    BUN 20 03/27/2018    NA 136 03/27/2018    K 4.1 03/27/2018    CL 103 03/27/2018    CO2 28 03/27/2018     Lab Results   Component Value Date    WBC 6.3 03/27/2018    HGB 13.7 03/27/2018    HCT 41.0 03/27/2018    MCV 92 03/27/2018    PLT 210 03/27/2018       Medical Problems:  Patient Active Problem List    Diagnosis Date Noted    Chest pain 03/26/2018    NICM (nonischemic cardiomyopathy) 10/20/2017    Chronic combined systolic and diastolic HF (heart failure) 10/20/2017    Obesity, morbid, BMI 40.0-49.9 10/07/2017    DOE (dyspnea on exertion) 10/06/2017    Hyperglycemia 03/29/2017    Essential hypertension 03/29/2017    Combined systolic and diastolic congestive heart failure 03/29/2017  Dehydration, moderate 03/29/2017    Anemia, unspecified type 03/29/2017    Renal insufficiency 03/29/2017    Near syncope 03/28/2017       History from Cardiology at [facility] [place]  1. 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   - LHC (6/17): 30% mid RCA, no other significant disease.   - Echo (6/17): EF 30-35%.   - Holter (7/17): 19% PVCs.   - Echo (3/18): EF 50-55%, moderate LVH, grade II diastolic dysfunction.   - CPX (3/18): submaximal, RER 0.99, VO2 20.8 (89% predicted), VE/VCO2 29 => no significant HF limitation.   - Echo  (9/18): EF 50-55%  - echo 10/17/2017 ef 50%    2. Morbid obesity    3. Type II diabetes, uncontrolled    4. Hypertriglyceridemia    5. NSVT/PVCs:   - 48 hour holter (7/17) with 19% PVCs.  - PVC ablation in 8/17 but PVCs recurred.   - Holter (11/17) with rare PVCs, < 1%.   - Holter (9/18) with rare PVCs only    6. 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     7. Sleep study (1/18): Mild OSA - no cpap recommended  .   8. ABIs (3/18): Normal    9. Leg weakness with atorvastatin.     Past Medical History:   Diagnosis Date    Cardiomyopathy     Congestive heart failure     Diabetes mellitus     Hypertension     PVC's (premature ventricular contractions)      Past Surgical History:   Procedure Laterality Date    APPENDECTOMY      CARDIAC PACEMAKER PLACEMENT      RIGHT & LEFT HEART CATH  W/ CORONARY ANGIOS, LV/LA Right 03/30/2018    Procedure: RIGHT & LEFT HEART CATH  W/ CORONARY ANGIOS, LV/LA;  Surgeon: Santo Held, MD;  Location: [facility] C[facility]/EP;  Service: Cardiovascular;  Laterality: Right;     Family History   Problem Relation Age of Onset    Heart disease Father     Hypertension Father     Heart disease Sister     Heart attack Sister     Heart disease Brother     Diabetes Brother     Diabetes Paternal Grandmother      Social History     Socioeconomic History    Marital status: Married     Spouse name: Not on file    Number of children: Not on file    Years of education: Not on file    Highest education level: Not on file   Occupational History    Not on file   Social Needs    Financial resource strain: Not on file    Food insecurity:     Worry: Not on file     Inability: Not on file    Transportation needs:     Medical: Not on file     Non-medical: Not on file   Tobacco Use    Smoking status: Former Smoker    Smokeless tobacco: Former Estate agent and Sexual Activity    Alcohol use: No    Drug use: No    Sexual activity: Not on  file     Comment: Deferred   Lifestyle    Physical activity:     Days per week: Not on file     Minutes per session: Not on file  Stress: Not on file   Relationships    Social connections:     Talks on phone: Not on file     Gets together: Not on file     Attends religious service: Not on file     Active member of club or organization: Not on file     Attends meetings of clubs or organizations: Not on file     Relationship status: Not on file    Intimate partner violence:     Fear of current or ex partner: Not on file     Emotionally abused: Not on file     Physically abused: Not on file     Forced sexual activity: Not on file   Other Topics Concern    Not on file   Social History Narrative    Not on file     Married   Non smoker - isolated smoking before age 36  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      Current Outpatient Medications   Medication Sig Dispense Refill    atorvastatin (LIPITOR) 20 MG tablet Take 1 tablet (20 mg total) by mouth nightly 30 tablet 2    carvedilol (COREG) 6.25 MG tablet Take 1 tablet (6.25 mg total) by mouth 2 (two) times daily with meals 180 tablet 1    furosemide (LASIX) 40 MG tablet TAKE 1 TABLET BY MOUTH AS NEEDED FOR  WEIGHT  GAIN 30 tablet 0    insulin lispro, 1 Unit Dial, (HUMALOG KWIKPEN) 100 UNIT/ML Solution Pen-injector injection pen Inject 8 Unit into the skin 3 (three) times daily before meals 3 mL 1    Insulin Pen Needle (PEN NEEDLES 3/16") 31G X 5 MM Misc 1 each by Does not apply route 4 (four) times daily 901 each 1    lisinopril (PRINIVIL,ZESTRIL) 20 MG tablet Take 1 tablet (20 mg total) by mouth daily 90 tablet 1    potassium chloride (K-TAB, KLOR-CON) 10 MEQ tablet Take 1 tablet (10 mEq total) by mouth daily 90 tablet 3    spironolactone (ALDACTONE) 25 MG tablet Take 1 tablet (25 mg total) by mouth daily 90 tablet 1     No current facility-administered medications for this  encounter.      No Known Allergies    Vitals:  There were no vitals taken for this visit.. Estimated body mass index is 38.52 kg/m as calculated from the following:    Height as of 05/11/18: 1.88 m (6\' 2" ).    Weight as of 05/11/18: 136.1 kg (300 lb).  His weight is up 6 lbs office visit - up 10 lbs per recent weights?     Physical Exam:   General: overweight male, alert, NAD  HEENT:  Anicteric sclera, supple, no significant JVD.  Lungs: Bibasilar decreased air entry, no crackles or rails.   Cardiovascular: s1s2, normal rate, regular rhythm, no m/r/g  Abdominal: Soft, obese, nontender  Extremities: warm to touch, no edema  Neuromuscular exam: grossly non-focal, though not formally tested.    Assessment and Plan:  1. Chronic combined systolic and diastolic congestive heart failure     2. NICM (nonischemic cardiomyopathy)     3. Essential hypertension     4. Pacemaker     5. Paroxysmal atrial fibrillation     6. Obesity, morbid, BMI 40.0-49.9         Cardiomyopathy:   My impression is that,  Mr. Pieroni has nonischemic cardiomyopathy, chronic systolic/diastolic heart failure ACC/AHA stage C, class III with improved EF.  Was recently reinitiated neurohormonal heart medical therapy.    He is improving from heart failure standpoint, better compensated.  Unclear what her true dry weight is given his significant fasting in the recent past.  Likely under 300 pounds however.    Significant, debilitating fatigue is unlikely to be secondary to heart failure -which appears fairly well compensated at this time -and is more likely secondary to side effect of beta-blocker.  Can try different choice of beta-blocker.      In regards to OMM:  Beta-blocker: yes  ACE-I/ARB/ARNI: yes  Aldosterone antagonist: no    Hydralazine/nitrate: no   Digoxin: no    Medication changes:  Stop carvedilol completely, start Toprol-XL 50 mg to be taken at bedtime.  Continue with lisinopril and Aldactone.  Also increase Lasix to 80 mg morning and 40 mg  afternoon at least for the next 2 weeks, follow-up in the clinic in 2 weeks for i-STAT lab work and further optimization of medical therapy.    Counseling:  -I have encouraged pt to continue to follow a <2gm/day Na restriction and limit fluid intake to < 2L/day.  -I have requested pt perform daily wts and contact our office for increase of >2-3lbs in 1 day or 5lbs in 1 week.   -I have encouraged pt to engage in aerobic activity as tolerated and avoid heavy weight lifting.   -Smoking/Alcohol: I have advised pt to abstain from any tobacco use and excessive alcohol intake.    -Weight: Obesity is a significant risk for both short and long-term morbidity and mortality for AHF patients.  I have stressed the importance of weight control and dietary management.         Electronically signed by:  Faythe Ghee, MD  [facility]  [address], [address]  [place], [place] [address]  [phone]    [facility] - [facility]  [address]  [place], [place] [address]  [phone]

## 2018-05-26 ENCOUNTER — Other Ambulatory Visit: Payer: Self-pay | Admitting: Family Nurse Practitioner

## 2018-06-08 ENCOUNTER — Telehealth: Payer: Self-pay

## 2018-06-08 NOTE — Telephone Encounter (Signed)
Spoke with pt and reminded him of his appointment with [place] tomorrow at 2.     Marland Kitchen, CNA

## 2018-06-09 ENCOUNTER — Ambulatory Visit: Payer: 59

## 2018-06-09 ENCOUNTER — Other Ambulatory Visit: Payer: Self-pay | Admitting: Internal Medicine

## 2018-06-09 ENCOUNTER — Ambulatory Visit
Admission: RE | Admit: 2018-06-09 | Discharge: 2018-06-09 | Disposition: A | Discharge status: Home / Self Care | Payer: 59 | Source: Ambulatory Visit | Attending: Family Nurse Practitioner | Admitting: Family Nurse Practitioner

## 2018-06-09 VITALS — BP 105/67 | HR 81 | Wt 301.0 lb

## 2018-06-09 DIAGNOSIS — D649 Anemia, unspecified: Secondary | ICD-10-CM

## 2018-06-09 DIAGNOSIS — R55 Syncope and collapse: Secondary | ICD-10-CM

## 2018-06-09 DIAGNOSIS — I5042 Chronic combined systolic (congestive) and diastolic (congestive) heart failure: Secondary | ICD-10-CM

## 2018-06-09 DIAGNOSIS — I10 Essential (primary) hypertension: Secondary | ICD-10-CM

## 2018-06-09 DIAGNOSIS — I428 Other cardiomyopathies: Secondary | ICD-10-CM

## 2018-06-09 DIAGNOSIS — R5383 Other fatigue: Secondary | ICD-10-CM | POA: Insufficient documentation

## 2018-06-09 DIAGNOSIS — I480 Paroxysmal atrial fibrillation: Secondary | ICD-10-CM

## 2018-06-09 DIAGNOSIS — E860 Dehydration: Secondary | ICD-10-CM

## 2018-06-09 DIAGNOSIS — R0609 Other forms of dyspnea: Secondary | ICD-10-CM

## 2018-06-09 DIAGNOSIS — N289 Disorder of kidney and ureter, unspecified: Secondary | ICD-10-CM

## 2018-06-09 DIAGNOSIS — R739 Hyperglycemia, unspecified: Secondary | ICD-10-CM

## 2018-06-09 DIAGNOSIS — R079 Chest pain, unspecified: Secondary | ICD-10-CM

## 2018-06-09 DIAGNOSIS — Z950 Presence of cardiac pacemaker: Secondary | ICD-10-CM

## 2018-06-09 LAB — I-STAT CHEM 8 CARTRIDGE
Anion Gap I-Stat: 18 — ABNORMAL HIGH (ref 7.0–16.0)
BUN I-Stat: 36 mg/dL — ABNORMAL HIGH (ref 7–22)
Calcium Ionized I-Stat: 5.1 mg/dL (ref 4.35–5.10)
Chloride I-Stat: 97 mMol/L — ABNORMAL LOW (ref 98–110)
Creatinine I-Stat: 1.8 mg/dL — ABNORMAL HIGH (ref 0.80–1.30)
EGFR: 43 mL/min/{1.73_m2} — ABNORMAL LOW (ref 60–150)
Glucose I-Stat: 276 mg/dL — ABNORMAL HIGH (ref 71–99)
Hematocrit I-Stat: 45 % (ref 39.0–52.5)
Hemoglobin I-Stat: 15.3 gm/dL (ref 13.0–17.5)
Potassium I-Stat: 4.8 mMol/L (ref 3.5–5.3)
Sodium I-Stat: 133 mMol/L — ABNORMAL LOW (ref 136–147)
TCO2 I-Stat: 25 mMol/L (ref 24–29)

## 2018-06-09 NOTE — Progress Notes (Signed)
[facility]      I had the pleasure of seeing [name] today at [facility] for an advanced heart failure follow up visit.  Follows with Dr. Lyn Koch at [facility] cardiology.    Samuel Koch is a 48 y.o. male with nonischemic cardiomyopathy, chronic combined systolic and diastolic heart failure, latest EF 50%, NYHA Class 3, ACC/AHA Stage C.  Has dual-chamber pacemaker in place.  Status post PVC ablation.  Also uncontrolled type II diabetic, obesity.    We have been optimizing heart failure medical therapy over the last few weeks.  Repeat echo showed improvement of left ventricular function, EF now 55%.    After last visit, stop carvedilol and started Toprol-XL 50 mg, and continue lisinopril and Aldactone, also recommended increase Lasix to 80 mg in the morning and 40 mg in the afternoon for days 2 days, then resume regular maintenance diuretic.    He complains of significant shortness of breath and fatigue, a lot of weakness more episodes of dizziness.  His weight was about 300 pounds at home this morning.  Blood pressure on standing is 90/68, while sitting 105/67.  He appears very symptomatic but able to do some activity at home, sealed his deck recently, when he takes frequent breaks is able to do the things he needs to do during the daytime.    Upon reviewing his presentation over the last few months, it seems that his symptoms have worsened in January after coming back in the country after traveling internationally, and he got sick, he had significant other curious about COVID testing and if he might have had it.  He feels very tired and legs feel heavy and so forth, similar to what he feels now.  Apparently, he was able to tolerate beta-blocker well before he got sick even with a cardiomyopathy.    Unclear why he remained so symptomatic with improved LV function.    Review of Systems   Constitution: Negative for appetite change,   fever, Positive for activity change and fatigue and unexpected weight change  HENT: Negative for trouble swallowing, and voice change  Eyes: Negative for any visual disturbances  Respiratory: Positive for sob  Cardiovascular: See above  Gastrointestinal: Negative for abdominal pain, blood in stool, nausea, and vomiting Positive for bloating   Endocrine: Negative for cold intolerance, heat intolerance, and polydipsia  GU: Negative for hematuria  Musculoskeletal: Negative for gait problems, joint swelling, neck pain, and pain or heaviness in legs  Skin: Negative for rash, wound, and ulcers on legs/feet  Neurological: Negative for dizziness, headaches, and syncope  Hematologic: Negative for bruises  Psychiatric: Negative for anxiety, and sleep disturbance    Comprehensive review of systems performed by me. Unless otherwise noted, all systems are negative.      Most recent laboratory testing reveals:   LABORATORY DATA:  Lab Results   Component Value Date    BUN 20 03/27/2018    NA 136 03/27/2018    K 4.1 03/27/2018    CL 103 03/27/2018    CO2 28 03/27/2018     Lab Results   Component Value Date    WBC 6.3 03/27/2018    HGB 13.7 03/27/2018    HCT 41.0 03/27/2018    MCV 92 03/27/2018    PLT 210 03/27/2018       Medical Problems:  Patient Active Problem List    Diagnosis Date Noted    Chest pain 03/26/2018  NICM (nonischemic cardiomyopathy) 10/20/2017    Chronic combined systolic and diastolic HF (heart failure) 10/20/2017    Obesity, morbid, BMI 40.0-49.9 10/07/2017    DOE (dyspnea on exertion) 10/06/2017    Hyperglycemia 03/29/2017    Essential hypertension 03/29/2017    Combined systolic and diastolic congestive heart failure 03/29/2017    Dehydration, moderate 03/29/2017    Anemia, unspecified type 03/29/2017    Renal insufficiency 03/29/2017    Near syncope 03/28/2017       History from Cardiology at [facility]  1. Chronic systolic CHF: Nonischemic cardiomyopathy, due to PVCs versus prior  myocarditis.   - LHC (6/17): 30% mid RCA, no other significant disease.   - Echo (6/17): EF 30-35%.   - Holter (7/17): 19% PVCs.   - Echo (3/18): EF 50-55%, moderate LVH, grade II diastolic dysfunction.   - CPX (3/18): submaximal, RER 0.99, VO2 20.8 (89% predicted), VE/VCO2 29 => no significant HF limitation.   - Echo (9/18): EF 50-55%  - echo 10/17/2017 ef 50%    2. Morbid obesity    3. Type II diabetes, uncontrolled    4. Hypertriglyceridemia    5. NSVT/PVCs:   - 48 hour holter (7/17) with 19% PVCs.  - PVC ablation in 8/17 but PVCs recurred.   - Holter (11/17) with rare PVCs, < 1%.   - Holter (9/18) with rare PVCs only    6. 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     7. Sleep study (1/18): Mild OSA - no cpap recommended  .   8. ABIs (3/18): Normal    9. Leg weakness with atorvastatin.     Past Medical History:   Diagnosis Date    Cardiomyopathy     Congestive heart failure     Diabetes mellitus     Hypertension     PVC's (premature ventricular contractions)      Past Surgical History:   Procedure Laterality Date    APPENDECTOMY      CARDIAC PACEMAKER PLACEMENT      RIGHT & LEFT HEART CATH  W/ CORONARY ANGIOS, LV/LA Right 03/30/2018    Procedure: RIGHT & LEFT HEART CATH  W/ CORONARY ANGIOS, LV/LA;  Surgeon: Santo Held, MD;  Location: [facility] CATH/EP;  Service: Cardiovascular;  Laterality: Right;     Family History   Problem Relation Age of Onset    Heart disease Father     Hypertension Father     Heart disease Sister     Heart attack Sister     Heart disease Brother     Diabetes Brother     Diabetes Paternal Grandmother      Social History     Socioeconomic History    Marital status: Married     Spouse name: Not on file    Number of children: Not on file    Years of education: Not on file    Highest education level: Not on file   Occupational History    Not on file   Social Needs    Financial resource strain: Not on file    Food  insecurity:     Worry: Not on file     Inability: Not on file    Transportation needs:     Medical: Not on file     Non-medical: Not on file   Tobacco Use    Smoking status: Former Smoker    Smokeless tobacco: Former Estate agent and  Sexual Activity    Alcohol use: No    Drug use: No    Sexual activity: Not on file     Comment: Deferred   Lifestyle    Physical activity:     Days per week: Not on file     Minutes per session: Not on file    Stress: Not on file   Relationships    Social connections:     Talks on phone: Not on file     Gets together: Not on file     Attends religious service: Not on file     Active member of club or organization: Not on file     Attends meetings of clubs or organizations: Not on file     Relationship status: Not on file    Intimate partner violence:     Fear of current or ex partner: Not on file     Emotionally abused: Not on file     Physically abused: Not on file     Forced sexual activity: Not on file   Other Topics Concern    Not on file   Social History Narrative    Not on file     Married   Non smoker - isolated smoking before age 77  Isolated drinking before age 58 - 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      Current Outpatient Medications   Medication Sig Dispense Refill    atorvastatin (LIPITOR) 20 MG tablet Take 1 tablet (20 mg total) by mouth nightly 30 tablet 2    furosemide (LASIX) 40 MG tablet 80mg  in the am and 40mg  qpm      insulin lispro, 1 Unit Dial, (HUMALOG KWIKPEN) 100 UNIT/ML Solution Pen-injector injection pen Inject 8 Unit into the skin 3 (three) times daily before meals 3 mL 1    Insulin Pen Needle (PEN NEEDLES 3/16") 31G X 5 MM Misc 1 each by Does not apply route 4 (four) times daily 901 each 1    lisinopril (PRINIVIL,ZESTRIL) 20 MG tablet Take 1 tablet (20 mg total) by mouth daily 90 tablet 1    metoprolol succinate XL (TOPROL XL) 50 MG 24 hr tablet Take 1 tablet (50 mg total) by mouth  nightly 90 tablet 2    potassium chloride (K-TAB, KLOR-CON) 10 MEQ tablet Take 1 tablet (10 mEq total) by mouth daily 90 tablet 3    spironolactone (ALDACTONE) 25 MG tablet Take 1 tablet (25 mg total) by mouth daily 90 tablet 1     No current facility-administered medications for this encounter.      No Known Allergies    Vitals:    Vitals:       Physical Exam:   General: overweight male, alert, NAD  HEENT:  Anicteric sclera, supple, no significant JVD.  Lungs: Bibasilar decreased air entry, no crackles or rails.   Cardiovascular: s1s2, normal rate, regular rhythm, no m/r/g  Abdominal: Soft, obese, nontender  Extremities: warm to touch, no edema  Neuromuscular exam: grossly non-focal, though not formally tested.    Assessment and Plan:  1. Chronic combined systolic and diastolic congestive heart failure  Echocardiogram Adult Limited WO Clr/Dopp Waveform    SARS CoV 2 RNA    SARS Coronavirus 2 IgG Antibody   2. NICM (nonischemic cardiomyopathy)  Echocardiogram Adult Limited WO Clr/Dopp Waveform    SARS CoV 2 RNA    SARS Coronavirus 2 IgG Antibody   3.  Essential hypertension  Echocardiogram Adult Limited WO Clr/Dopp Waveform    SARS CoV 2 RNA    SARS Coronavirus 2 IgG Antibody   4. Pacemaker  Echocardiogram Adult Limited WO Clr/Dopp Waveform    SARS CoV 2 RNA    SARS Coronavirus 2 IgG Antibody   5. Paroxysmal atrial fibrillation  Echocardiogram Adult Limited WO Clr/Dopp Waveform    SARS CoV 2 RNA    SARS Coronavirus 2 IgG Antibody   6. Near syncope  Echocardiogram Adult Limited WO Clr/Dopp Waveform    SARS CoV 2 RNA    SARS Coronavirus 2 IgG Antibody   7. Hyperglycemia  Echocardiogram Adult Limited WO Clr/Dopp Waveform    SARS CoV 2 RNA    SARS Coronavirus 2 IgG Antibody   8. Dehydration, moderate  Echocardiogram Adult Limited WO Clr/Dopp Waveform    SARS CoV 2 RNA    SARS Coronavirus 2 IgG Antibody   9. Anemia, unspecified type  Echocardiogram Adult Limited WO Clr/Dopp Waveform    SARS CoV 2 RNA    SARS Coronavirus  2 IgG Antibody   10. Renal insufficiency  Echocardiogram Adult Limited WO Clr/Dopp Waveform    SARS CoV 2 RNA    SARS Coronavirus 2 IgG Antibody   11. DOE (dyspnea on exertion)  Echocardiogram Adult Limited WO Clr/Dopp Waveform    SARS CoV 2 RNA    SARS Coronavirus 2 IgG Antibody   12. Obesity, morbid, BMI 40.0-49.9  Echocardiogram Adult Limited WO Clr/Dopp Waveform    SARS CoV 2 RNA    SARS Coronavirus 2 IgG Antibody   13. Chronic combined systolic and diastolic HF (heart failure)  Echocardiogram Adult Limited WO Clr/Dopp Waveform    SARS CoV 2 RNA    SARS Coronavirus 2 IgG Antibody   14. Chest pain, unspecified type  Echocardiogram Adult Limited WO Clr/Dopp Waveform    SARS CoV 2 RNA    SARS Coronavirus 2 IgG Antibody       Cardiomyopathy:   My impression is that, Samuel Koch has nonischemic cardiomyopathy, chronic systolic/diastolic heart failure ACC/AHA stage C, class III with improved EF.  He is on fair neurohormonal heart for medical therapy, but of appears to have difficulty with beta-blocker.    He is improving from heart failure standpoint, better compensated.  Unclear what her true dry weight is given his significant fasting in the recent past.  Likely under 300 pounds however.    Despite improvement of his left ventricular function, and trying different beta-blocker -which she is able to tolerate in the last fall without any problem, now short of breath, fatigued, very weak and having significant weakness in his legs we cannot take any steps at times.      We will test for COVID-19.  Recommend device check.  Limited echocardiogram.    In regards to OMM:  Beta-blocker: yes  ACE-I/ARB/ARNI: yes  Aldosterone antagonist: no    Hydralazine/nitrate: no   Digoxin: no    Medication changes:  Decrease lisinopril to 10 mg daily, otherwise continue with Toprol-XL and Aldactone.  Hold Lasix tomorrow, then resume on Wednesday with lower dose of 40 mg twice a day.    Counseling:  -I have encouraged pt to continue to  follow a <2gm/day Na restriction and limit fluid intake to < 2L/day.  -I have requested pt perform daily wts and contact our office for increase of >2-3lbs in 1 day or 5lbs in 1 week.   -I have encouraged pt to engage in  aerobic activity as tolerated and avoid heavy weight lifting.   -Smoking/Alcohol: I have advised pt to abstain from any tobacco use and excessive alcohol intake.    -Weight: Obesity is a significant risk for both short and long-term morbidity and mortality for AHF patients.  I have stressed the importance of weight control and dietary management.         Electronically signed by:  Faythe Ghee, MD  [facility]  [address], [address]  [place], [place] [address]  [phone]    [facility] - [facility]  [address]  [place], [place] [address]  [phone]

## 2018-06-09 NOTE — Patient Instructions (Addendum)
Heart failure causes many symptoms.   Some of them are more serious than others.   It is important to recognize when you should call 911 for emergency help and when you should call your doctor or nurse for urgent attention.    The following lists can help you decide what to do when you experience certain symptoms. You may want to print this page and keep it in a convenient spot.    Emergency Symptoms of Heart Failure  Call 911 for emergency help, if you have:    -Chest discomfort or pain that lasts more than 15 minutes that is not relieved with rest or nitroglycerin.  -Severe, persistent shortness of breath.  -Fainted or passed out.    Urgent Symptoms of Heart Failure  Call your doctor immediately, if you have any of the following symptoms:    -Increasing shortness of breath or a new shortness of breath while resting.  -Trouble sleeping due to difficulty breathing. For example, waking up suddenly at night due to difficulty breathing.  -A need to sleep sitting up or on more pillows than usual.  -Fast or irregular heart beats, palpitations, or a racing heart that persists and makes you feel dizzy or lightheaded.    Or if you:  -Cough up frothy or pink sputum.  -Feel like you may faint or pass out.    To manage your heart failure, please follow the following recommendations:    Fluid restriction:  You should follow a daily fluid restriction not to exceed 2 Liters daily (equivalent to 64 fluid ounces). This includes all things liquid at room temperature such as: all beverages, soup, ice cream, popsicles, jello and ice (you will need to guess how much the ice would be when melted and add toward your daily total). Keep track of your fluid intake throughout the day and measure everything.     Daily weights:  Weigh yourself every day when you wake up, right after using the bathroom. Keep a record of your daily weights and call our office if you gain more than 2-3 pounds in 1 day or 5 pounds in less than a week.     Low  sodium diet:  Sodium can lead to fluid retention and patients with heart failure should follow the low sodium diet, do not exceed 2000 mg sodium daily. Do not cook with salt or add it at the table. This includes table salt, sea salt, Kosher salt, garlic salt, seasoned salt or other seasoning's with salt (read the nutrition label). Avoid eating out at restaurants and high sodium foods such as processed meats, pre-packaged meals, pickles, soy sauce, chips, canned soups and bacon. Ask your healthcare provider for more information.    Salt Substitutes   When you eat sodium, your body dilutes it with water. This causes water retention, high blood pressure and a tough job for your heart! Limiting salt will give your heart a well-deserved break but you dont have to sacrifice taste! Salt substitutes enhance the flavor of food without adding sodium. Replace your salt shaker with herbs & spices to support the health of your heart.    Potassium-Salt versus Sodium Salt-Substitutes  Some salt substitutes use Potassium Chloride instead of Sodium Chloride. For some, extra potassium is a bad idea because too much may cause heart rhythm problems. Never use a salt substitute with potassium chloride without first checking with your health care provider.     Contain Potassium:  - Nu-Salt by Sweet & Low   -  NoSalt  - Morton Salt Substitute  - AlsoSalt    No Potassium or Sodium:  - Mrs. Dash (in 10+ flavors)  - [place] No Added Salt (in 4 flavors)  - Emerils No Salt (only 1 flavor: Essence [place])  - Sport and exercise psychologist by [employer] (in 7 flavors)  Herbs & Spices  - lemon or lime juice  - flavored vinegar  - thyme   - basil   - oregano  - sage  - nutmeg   - black pepper  - cayenne pepper  - paprika   - ginger   - cumin  - onion powder  - mace   - celery seed  - garlic powder  - fresh garlic  - curry powder  - mustard seed  - parsley flakes  - cinnamon  - bay leaf     Other Salt-Free Ideas  Season or marinate  meat, poultry, and fish ahead of time with onion, garlic, and your favorite herbs before cooking to bring out the flavor.    Activity:  We encourage you to engage in regular physical activity, such as daily walking. Start with short distances and walk at a comfortable pace. You should be able to "walk and talk". Gradually increase the distance and time that you walk. Exercise will help you feel better and can relieve the symptom of "heavy, tired legs". Stop exercise if you have chest pain, dizziness, or get so short of breath that you can not talk.   You should not lift/push/or pull heavy objects (anything heavy enough to make you strain).     Medications:  Take all your medications as prescribed by your health care provider and bring all your medication bottles to every appointment. It is important that you know what you are taking, when, and for what reason.    Medications you should not take:  Nonsteroidal anti-inflammatory drugs (NSAIDS): such as ibuprofen, Advil, Motrin, naprosyn, Naproxen, may cause fluid retention. You should not take these medications.    Call your healthcare provider if you are not feeling well or have new problems. We will call you back within 24 hours (Monday-Friday). In case of an emergency, call 911 or go to the nearest emergency department.      Thank you for choosing the [facility] at [facility] with [facility] for your care. It is important to keep all your medical appointments, even if you feel well. To reschedule or cancel an appointment with our clinic, call [phone]. If you need to leave a message on the nurse line for the heart failure clinic, please call [phone] or [phone].        Decrease lisinopril to 10 mg daily.  Hold Lasix tomorrow, then resume on Wednesday with lower dose of 40 mg twice a day.  Limited echocardiogram.  COVID-19 testing.  Device check.

## 2018-06-11 ENCOUNTER — Other Ambulatory Visit: Payer: Self-pay | Admitting: Cardiovascular Disease

## 2018-06-11 ENCOUNTER — Telehealth: Payer: Self-pay | Admitting: Internal Medicine

## 2018-06-11 LAB — SARS COV 2 ANTIBODY IGG: SARS CoV 2 Antibody IgG: NEGATIVE

## 2018-06-11 NOTE — Telephone Encounter (Signed)
Perfect. Thank you!

## 2018-06-11 NOTE — Telephone Encounter (Signed)
Good morning!    Ok, called patient and he is all set for Echo tomorrow at [facility].     Thank you, [employer]

## 2018-06-11 NOTE — Telephone Encounter (Signed)
Good morning.    Can you please call him and get him scheduled for a limited ECHO today or tomorrow?    Thank you!!!  Skeet Simmer

## 2018-06-11 NOTE — Progress Notes (Unsigned)
Cardiovascular Imaging [facility]    Complete Echo    Patient name: Dhanvin Szeto [id number]   Gender: male    Test Date: 06/12/18  DOB: October 01, 1970    Age: 48 y.o.    BP:         Height:  6'2          Weight:    301   lbs  Indication: chf    Ordering Physician: ***hillary k            Primary: Tandy Gaw, MD    Sonographer: Valentino Saxon      Risk Factors:  HTN   PHTN   HPL   Dyslipidemia   CP   CAD/CABG/STENT   NSTEMI   Prior MI     Tachy/Brady   CKD   Murmur   GERD   SOB/DOE/COPD/Asthma/OSA  Edema    Fatigue   Anemia   Hypothyroid   SSS   AFIB/flutter   SVT/VT   PVCS/PAC    Near/ Syncope  Palpitations  ABN EKG  RBBB/LBBB/CHB  Dialted AO Root    AAA w/Repair  Thoracic Ao W/repair  ASD/ASD Repair VSD/ VSD Repair  PFO    Tetralogy of Fallot  Ebstein's  Marfan's  Ross Procedure  Obese  Diabetes    Former/Smoker/Tobacco use    Effusion: Pleural/Pericardial   Heart Failure: Diastolic/systolic/Unspecified    Devices:  Pacemaker/Defibrillator  Cardiomyopathy: Ischemic  NICM  Dilated   Hypertrophic/HOCM  Mitral Valve:  MV Repair  MV replacement MV Clip/Ring  MR MS  MVP  Tricuspid Valve: TV Repair   TV Replacement    Tricuspid Regurgitation      Aortic Valve: AS        AI         BIC. AV    Aortic Valve Repair    s/p TAVR   AVR  Pulmonic Valve:    PI     PS       nka

## 2018-06-12 ENCOUNTER — Other Ambulatory Visit: Payer: 59

## 2018-06-12 DIAGNOSIS — R079 Chest pain, unspecified: Secondary | ICD-10-CM

## 2018-06-12 DIAGNOSIS — R0609 Other forms of dyspnea: Secondary | ICD-10-CM

## 2018-06-15 ENCOUNTER — Encounter: Payer: Self-pay | Admitting: Internal Medicine

## 2018-06-15 ENCOUNTER — Other Ambulatory Visit: Payer: Self-pay | Admitting: Internal Medicine

## 2018-06-15 DIAGNOSIS — I509 Heart failure, unspecified: Secondary | ICD-10-CM

## 2018-06-15 DIAGNOSIS — R079 Chest pain, unspecified: Secondary | ICD-10-CM

## 2018-06-22 ENCOUNTER — Encounter: Payer: Self-pay | Admitting: Internal Medicine

## 2018-06-26 ENCOUNTER — Telehealth: Payer: Self-pay

## 2018-06-26 NOTE — Telephone Encounter (Signed)
Pt's wife, Israel Werts, left a voicemail with phone triage this morning stating that pt has been c/o dry heaving, nausea, dizziness, and fatigue.    Called Mrs. Bassin back, no answer, got her personal voicemail, LM advising her to contact PCP to find out if they would like for him to be seen or to go to urgent care for IV fluids for possible dehydration, etc.    Rogelia Mire, RN

## 2018-07-02 ENCOUNTER — Telehealth: Payer: Self-pay

## 2018-07-02 NOTE — Telephone Encounter (Signed)
pcp ov, labs in chart

## 2018-07-06 ENCOUNTER — Ambulatory Visit (INDEPENDENT_AMBULATORY_CARE_PROVIDER_SITE_OTHER): Payer: Self-pay

## 2018-07-08 ENCOUNTER — Telehealth: Payer: 59 | Admitting: Medical

## 2018-07-08 ENCOUNTER — Encounter: Payer: Self-pay | Admitting: Medical

## 2018-07-08 VITALS — BP 131/77 | HR 99 | Ht 74.0 in | Wt 307.0 lb

## 2018-07-08 DIAGNOSIS — I5042 Chronic combined systolic (congestive) and diastolic (congestive) heart failure: Secondary | ICD-10-CM

## 2018-07-08 DIAGNOSIS — I493 Ventricular premature depolarization: Secondary | ICD-10-CM | POA: Insufficient documentation

## 2018-07-08 DIAGNOSIS — I10 Essential (primary) hypertension: Secondary | ICD-10-CM

## 2018-07-08 DIAGNOSIS — Z45018 Encounter for adjustment and management of other part of cardiac pacemaker: Secondary | ICD-10-CM

## 2018-07-08 DIAGNOSIS — Z6839 Body mass index (BMI) 39.0-39.9, adult: Secondary | ICD-10-CM

## 2018-07-08 DIAGNOSIS — I495 Sick sinus syndrome: Secondary | ICD-10-CM | POA: Insufficient documentation

## 2018-07-08 DIAGNOSIS — I428 Other cardiomyopathies: Secondary | ICD-10-CM

## 2018-07-08 NOTE — Progress Notes (Signed)
Cardiology Visit  via Telehealth      Patient Name: Samuel Koch   Date of Birth: [birth year]    Provider: Joneen Roach, PA     Patient Care Team:  Tandy Gaw, MD as PCP - General (Family Medicine)  Abel Presto, DO as Consulting Physician (Cardiology)  Elizbeth Squires, NP as Nurse Practitioner (Family Nurse Practitioner)  Joneen Roach, PA as Physician Assistant (Physician Assistant)    Chief Complaint: Cardiomyopathy and Congestive Heart Failure      This is a telehealth visit which was conducted with the use of interactive HIPPA compliant Telehealth visit - [employer] telecommunication that permitted real time communication between Samuel Koch [facility] and myself.     He consented to participation and received services at home, while I was located at [facility] Office.     This visit was changed from an in-person visit to a telehealth visit to lower the risk of exposure and / or spread of the current pandemic with the SARS CoV-2 virus. This is based on the guidelines from the [facility] and other health agencies.      History of Present Illness   Samuel Koch is a 48 y.o. male being seen today for  Follow-up.     Regularly followed by Dr. [name] and is followed by Dr Mellody Drown in the Advanced Heart failure clinic.  He has a history of nonischemic cardiomyopathy which has since improved.  Last echo in May 78295 showed EF at 50-55%.  Mild LVH.  Normal diastolic function.  Aortic root mildly dilated at 3.9.  Ascending aorta 3.5 cm.  Mild tricuspid regurgitation with RV systolic pressure 27 mmHg.      He has a history of frequent PVCs and is status post PVC ablation in [place], [place].  He has had good control of his PVCs.    He has a dual-chamber pacemaker that was implanted for sick sinus syndrome.  Device check last month showed appropriate function.  There is a brief episode of nonsustained VT, otherwise no tachyarrhythmias.  Atrial pacing 65% ventricular pacing  less than 1%.  Lead data and trends are stable.    He follows with Dr. Mellody Drown at the advanced heart failure clinic.  He has had multiple medications adjusted, and continues to complain of trouble with significant shortness of breath and fatigue.  He was also having some trouble with dizziness, felt to be secondary to dehydration/ low BP.  He was last seen May 5 at which time lisinopril was reduced to 10 mg daily.  His carvedilol was previously changed to Toprol-XL which he thinks may have helped with some of his symptoms mildly.  Lasix was also reduced.     Repeat limited echo showed no change.    Labs from March showed TSH of 1.8, B12 443, hemoglobin A1c 8.3%.  CMP and CBC within normal limits.    He was previously tested for sleep apnea in [facility] and was told that he does not have sleep apnea.    He has had trouble with his weight for some time.  He states that he lost a significant amount of weight but has been unable to get below  300.  This frustrates him as well and he wonders if this may be contributing to some of his symptoms.    Past Medical History     Past Medical History 07/08/2018 LLB-     1. Chronic combined systolic and  diastolic CHF  2. Nonischemic cardiomyopathy  A. LHC (6/17): 30% mid RCA, no other significant disease.   B. Echo (6/17): EF 30-35%.   C. Holter (7/17): 19% PVCs.   D. Echo (3/18): EF 50-55%, moderate LVH, grade II diastolic dysfunction.   E. Echo (9/18): EF 50-55%  F. Echo 10/07/17- LV cavity size is increased. The LVEF is est. at 50%. Mild hypokinesis of the distal septum and apex which may be due to paced rhythm. Pacer wire present in RV.   G. Echo 06/12/2018- Mild concentric LVH, EF 50-55%, aortic root is mildy dilated, aortic root is 3.9cm, tricuspid regurgitation is mild, RSVP .     3. Type II diabetes  4. Hypertriglyceridemia  5. PVCs  A. 48 hour holter (7/17) with 19% PVCs.  B. PVC ablation in 8/17 but PVCs recurred. Will   C. Holter (11/17) with rare PVCs,  < 1%.   D. Holter (9/18) with rare PVCs only  6. Bradycardia/ SSS  A. Dual chamber pacemaker placed ([employer], MRI compatible).     7. NSVT  8. Acute Coronary Syndrome   A. Echo 03/27/2018- Technically difficult study with limited endocardial definition and structural resolution in the apical views, Intravenous contrast was administered to enhance endocardial definition, LV cavity size is norma, mild concentric left ventricular hypertrophy, LV systolic function is at the lower limits of normal, EF 50%, no regional wall motion abnormalities present, LV diastolic filling pattern is probably pseudonormalized, consistent with grade 2 left ventricular diastolic dysfunction and elevated left atrial pressure, Right-sided chambers are not optimally visualized but appear grossly normal in size, Pacemaker lead is visualized within the right heart chamber, no significant valvular heart disease.   B. Stress 03/28/2018- Completed vasodilator protocol., No ischemic changes with infusion, No arrhythmias, Overall, low risk vasodilator stress rest SPECT perfusion study, a large moderate to severe perfusion defect involving the apex, inferior stripe, mid and basal inferoseptal and inferolateral segments with mild reversibility suggestive of possible ischemia but with likely strong component of diaphragmatic attenuation artifact, Also anteroseptal perfusion defect from attenuation artifact, No significant findings of ischemia Noted, Dilated LV with mildly decreased left ventricular function, stress EF 44%.   C. THC 03/30/2018- Negative heart cath.     Family History     Family History   Problem Relation Age of Onset    Heart disease Father     Hypertension Father     Heart disease Sister     Heart attack Sister     Heart disease Brother     Diabetes Brother     Diabetes Paternal Grandmother        Social History     Social History     Tobacco Use    Smoking status: Former Smoker    Smokeless tobacco: Former Estate agent Use  Topics    Alcohol use: No    Drug use: No     Medications     Current Outpatient Medications:     atorvastatin (LIPITOR) 20 MG tablet, Take 1 tablet (20 mg total) by mouth nightly, Disp: 30 tablet, Rfl: 2    furosemide (LASIX) 40 MG tablet, 40 mg 2 (two) times daily  , Disp: , Rfl:     insulin lispro, 1 Unit Dial, (HUMALOG KWIKPEN) 100 UNIT/ML Solution Pen-injector injection pen, Inject 8 Unit into the skin 3 (three) times daily before meals, Disp: 3 mL, Rfl: 1    Insulin Pen Needle (PEN NEEDLES 3/16") 31G X 5 MM Misc,  1 each by Does not apply route 4 (four) times daily, Disp: 901 each, Rfl: 1    lisinopril (PRINIVIL,ZESTRIL) 20 MG tablet, Take 1 tablet (20 mg total) by mouth daily, Disp: 90 tablet, Rfl: 1    metoprolol succinate XL (TOPROL XL) 50 MG 24 hr tablet, Take 1 tablet (50 mg total) by mouth nightly, Disp: 90 tablet, Rfl: 2    potassium chloride (K-TAB, KLOR-CON) 10 MEQ tablet, Take 1 tablet (10 mEq total) by mouth daily (Patient taking differently: Take 10 mEq by mouth daily ONLY WHEN DOUBLING LASIX ), Disp: 90 tablet, Rfl: 3    spironolactone (ALDACTONE) 25 MG tablet, Take 1 tablet (25 mg total) by mouth daily, Disp: 90 tablet, Rfl: 1  Physical Exam      Vitals:    07/08/18 0909   BP: 131/77   Pulse: 99   Weight: 139.3 kg (307 lb)   Height: 1.88 m (6\' 2" )     Wt Readings from Last 3 Encounters:   07/08/18 139.3 kg (307 lb)   06/09/18 136.5 kg (301 lb)   05/25/18 137.9 kg (304 lb)        General appearance - alert, well appearing, and in no distress  Head: normocephalic, atraumatic  Mental status -  Alert and oriented,   Eyes - extraocular eye movements intact  Neck -  Supple, no thyromegaly      Cardiovascular system -   No jugular venous distension    Neurological - alert, oriented x3, no obvious gross neurological deficits  Extremities -  no clubbing or cyanosis.  No edema bilateral  Psych- appropriate affect      Labs     Lab Results   Component Value Date/Time    WBC 6.3 03/27/2018 01:00 PM     RBC 4.47 03/27/2018 01:00 PM    HGB 13.7 03/27/2018 01:00 PM    HCT 41.0 03/27/2018 01:00 PM    PLT 210 03/27/2018 01:00 PM     Lab Results   Component Value Date/Time    NA 136 03/27/2018 01:00 PM    K 4.1 03/27/2018 01:00 PM    CL 103 03/27/2018 01:00 PM    CO2 28 03/27/2018 01:00 PM    GLU 165 (H) 03/27/2018 01:00 PM    BUN 20 03/27/2018 01:00 PM    CREAT 1.80 (H) 06/09/2018 02:11 PM    CREAT 0.95 03/27/2018 01:00 PM    PROT 7.2 10/06/2017 08:18 PM    ALKPHOS 89 10/06/2017 08:18 PM    AST 13 10/06/2017 08:18 PM    ALT 19 10/06/2017 08:18 PM     Lab Results   Component Value Date/Time    CHOL 201 (H) 03/26/2018 04:24 PM    TRIG 228 (H) 03/26/2018 04:24 PM    HDL 47 03/26/2018 04:24 PM    LDL 108 03/26/2018 04:24 PM     Lab Results   Component Value Date/Time    HGBA1CPERCNT 8.2 03/26/2018 04:24 PM     Lab Results   Component Value Date/Time    BNP <10.0 03/26/2018 04:24 PM       Impression and Recommendations:     1. NICM (nonischemic cardiomyopathy)  2.  Chronic combined systolic and diastolic HF (heart failure)  EF has since normalized with last 50-55% and normal diastolic function.  He was actually felt to be over diuresed at his last appointment and medications were dialed back.  He underwent cardiac catheterization back in February after having an abnormal stress  test which showed no evidence of coronary artery disease. Prior sleep study negative. Labs stable. He continues to have class III symptoms spite improvement in his LV function.       3. PVC's (premature ventricular contractions)  Good control, s/p PVC ablation.     4. SSS (sick sinus syndrome)  Pacer in place     5. Presence of permanent cardiac pacemaker  Normal function.     6. Diabetes   suboptimal control    7. Obesity  He would like to proceed with referral to bariatric clinic to further discuss. I will make referral.     8.  HTN  Controlled, meds recently reduced due to hypotension.    He was also recommended to undergo covid testing by Dr  Arnaldo Natal by Dr Mellody Drown.     Electronically signed by:   Joneen Roach, PA  07/08/2018

## 2018-07-16 ENCOUNTER — Other Ambulatory Visit: Payer: Self-pay

## 2018-07-16 ENCOUNTER — Telehealth: Payer: Self-pay

## 2018-07-16 NOTE — Telephone Encounter (Signed)
Referral sent to [facility].    Samuel Koch

## 2018-08-25 ENCOUNTER — Telehealth (INDEPENDENT_AMBULATORY_CARE_PROVIDER_SITE_OTHER): Payer: Self-pay | Admitting: Surgery

## 2018-08-25 NOTE — Telephone Encounter (Signed)
Called patient to review that there are no benefits for bariatric surgery under medical policy.  Offered to review self-pay packages, Scientist, clinical (histocompatibility and immunogenetics) and medical weight management options. Pt did not want to make apt, but he would like to schedule with Mellody Drown for DM, said Mellody Drown - cardio referred him but she was booking 4 months out, his blood sugars are now in the 400s. Fwd to J Furlong to look into referral, I only saw one for bariatrics and diabetes mngmnt program from [facility]

## 2018-09-17 MED ORDER — ATORVASTATIN CALCIUM 20 MG PO TABS
20.0000 mg | ORAL_TABLET | Freq: Every evening | ORAL | 2 refills | Status: DC
Start: 2018-09-17 — End: 2018-10-02

## 2018-09-17 NOTE — Addendum Note (Signed)
Encounter addended by: Adonis Brook, MD on: 09/17/2018 10:05 AM   Actions taken: Pharmacy for encounter modified, Order list changed

## 2018-10-02 ENCOUNTER — Encounter: Payer: Self-pay | Admitting: Family

## 2018-10-02 ENCOUNTER — Ambulatory Visit
Admission: RE | Admit: 2018-10-02 | Discharge: 2018-10-02 | Disposition: A | Discharge status: Home / Self Care | Payer: 59 | Source: Ambulatory Visit | Attending: Family | Admitting: Family

## 2018-10-02 VITALS — BP 119/82 | HR 88 | Resp 18 | Wt 316.0 lb

## 2018-10-02 DIAGNOSIS — I110 Hypertensive heart disease with heart failure: Secondary | ICD-10-CM | POA: Insufficient documentation

## 2018-10-02 DIAGNOSIS — I428 Other cardiomyopathies: Secondary | ICD-10-CM | POA: Insufficient documentation

## 2018-10-02 DIAGNOSIS — E119 Type 2 diabetes mellitus without complications: Secondary | ICD-10-CM

## 2018-10-02 DIAGNOSIS — I5042 Chronic combined systolic (congestive) and diastolic (congestive) heart failure: Secondary | ICD-10-CM | POA: Insufficient documentation

## 2018-10-02 DIAGNOSIS — R079 Chest pain, unspecified: Secondary | ICD-10-CM

## 2018-10-02 LAB — I-STAT CHEM 8 CARTRIDGE
Anion Gap I-Stat: 19 — ABNORMAL HIGH (ref 7.0–16.0)
BUN I-Stat: 26 mg/dL — ABNORMAL HIGH (ref 7–22)
Calcium Ionized I-Stat: 5.3 mg/dL — ABNORMAL HIGH (ref 4.35–5.10)
Chloride I-Stat: 103 mMol/L (ref 98–110)
Creatinine I-Stat: 1.2 mg/dL (ref 0.80–1.30)
EGFR: 70 mL/min/{1.73_m2} (ref 60–150)
Glucose I-Stat: 244 mg/dL — ABNORMAL HIGH (ref 71–99)
Hematocrit I-Stat: 44 % (ref 39.0–52.5)
Hemoglobin I-Stat: 15 gm/dL (ref 13.0–17.5)
Potassium I-Stat: 4.2 mMol/L (ref 3.5–5.3)
Sodium I-Stat: 141 mMol/L (ref 136–147)
TCO2 I-Stat: 24 mMol/L (ref 24–29)

## 2018-10-02 MED ORDER — BUMETANIDE 1 MG PO TABS
1.0000 mg | ORAL_TABLET | Freq: Two times a day (BID) | ORAL | 0 refills | Status: DC
Start: 2018-10-02 — End: 2018-10-28

## 2018-10-02 MED ORDER — FUROSEMIDE 10 MG/ML IJ SOLN
80.00 mg | Freq: Once | INTRAMUSCULAR | Status: AC
Start: 2018-10-02 — End: 2018-10-02
  Administered 2018-10-02: 11:00:00 80 mg via INTRAVENOUS
  Filled 2018-10-02: qty 8

## 2018-10-02 NOTE — Patient Instructions (Addendum)
Changes for Today:  Stop lasix,  Switch to Bumex 1 mg twice a day.  Continue to weight yourself every day, call us on Monday if your weight continues to increase over the weekend.  If you are doing okay, we will call you on Wednesday to check in with you..    I called Dr. Madlyn Frankel office, your appointment is scheduled for 10/1 at 0900.      Emergency Symptoms of Heart Failure  Call 911 for emergency help, if you have:    Marland Kitchen Chest discomfort or pain that lasts more than 15 minutes that is not relieved with rest or nitroglycerin.  . Severe, persistent shortness of breath.  . Fainted or passed out.    Urgent Symptoms of Heart Failure  Call your doctor immediately, if you have any of the following symptoms:    . Increasing shortness of breath or a new shortness of breath while resting.  . Trouble sleeping due to difficulty breathing. For example, waking up suddenly at night due to difficulty breathing.  . A need to sleep sitting up or on more pillows than usual.  . Fast or irregular heart beats, palpitations, or a "racing heart" that persists and makes you feel dizzy or lightheaded.  . Cough up frothy or pink sputum.  . Feel like you may faint or pass out.    Living with Heart Failure Information:    . Follow a sodium restricted diet. Eat no more than 2000 milligrams of sodium per day.  Read labels of packaged foods if you are not familiar with the sodium content per serving.   . Follow a 64 ounce fluid restriction. This is approximately equal to 2 liters, or 2000 cc's of fluid. Count all of the sources of fluid in your diet: drinks, fruits, vegetables, soups, etc.  . Weigh yourself daily.  . Report to our nurses a change in your weight of 2 pounds or more in one day, or 4 pounds or more in one week.  Call if you experience a significant change in your heart failure symptoms.    Activity:  We encourage you to engage in regular physical activity, such as daily walking. Start with short distances and walk at a comfortable  pace. You should be able to "walk and talk". Gradually increase the distance and time that you walk. Exercise will help you feel better and can relieve the symptom of "heavy, tired legs". Stop exercise if you have chest pain, dizziness, or get so short of breath that you can not talk.   You should not lift/push/or pull heavy objects (anything heavy enough to make you strain).     Medications:  Take all your medications as prescribed by your health care provider and bring all your medication bottles to every appointment. It is important that you know what you are taking, when, and for what reason.    Medications you should not take:  Nonsteroidal anti-inflammatory drugs (NSAIDS): such as ibuprofen, Advil, Motrin, naprosyn, Naproxen, may cause fluid retention. You should not take these medications.    Please call with any questions, concerns, or changes in symptoms.    Phone numbers:   - Clinic Office: [phone]

## 2018-10-02 NOTE — Progress Notes (Signed)
[facility]      I had the pleasure of seeing Samuel Koch today at [facility] for an advanced heart failure follow up visit.  Follows with Dr. [name] at [facility] cardiology.    Samuel Koch is a 48 y.o. male with nonischemic cardiomyopathy, chronic combined systolic and diastolic heart failure, latest EF 50%, NYHA Class 3, ACC/AHA Stage C.  Has dual-chamber pacemaker in place.  Status post PVC ablation.  Also uncontrolled type II diabetic, obesity.    We have been optimizing heart failure medical therapy over the last few weeks.  Repeat echo showed improvement of left ventricular function, EF now 55%.  He appears very symptomatic but able to do some activity at home and is able to walk pretty long distances but still have fatigued towards the end.  Unclear why he remained so symptomatic with improved LV function.    He complains of significant shortness of breath and fatigue.  He has noticed that his weight has increased over the last week, stating that on Tuesday his weight was 304, Wednesday 307, and yesterday 314, and now today in the clinic he is 316.  He states that he has been fasting for the last 2 weeks, only drinking water and 1 vegetable drink in the morning.  He is unsure of how much sodium is in the vegetable juice.  Is also stating that he has abdominal bloating.    He increased his Lasix from 40 mg twice a day to 80 mg twice a day on Tuesday with no change in weight.    He continues to complain of intermittent chest pain, he had a right and left heart cath that showed no significant coronary disease in February 2020.    Review of Systems   Constitution: Negative for appetite change,  fever, Positive for activity change and fatigue and unexpected weight change  HENT: Negative for trouble swallowing, and voice change  Eyes: Negative for any visual disturbances  Respiratory: Positive for sob  Cardiovascular: See above   Gastrointestinal: Negative for abdominal pain, blood in stool, nausea, and vomiting Positive for bloating   Endocrine: Negative for cold intolerance, heat intolerance, and polydipsia  GU: Negative for hematuria  Musculoskeletal: Negative for gait problems, joint swelling, neck pain, and pain or heaviness in legs  Skin: Negative for rash, wound, and ulcers on legs/feet  Neurological: Negative for dizziness, headaches, and syncope  Hematologic: Negative for bruises  Psychiatric: Negative for anxiety, and sleep disturbance    Comprehensive review of systems performed by me. Unless otherwise noted, all systems are negative.      Most recent laboratory testing reveals:   LABORATORY DATA:  Lab Results   Component Value Date    BUN 20 03/27/2018    NA 136 03/27/2018    K 4.1 03/27/2018    CL 103 03/27/2018    CO2 28 03/27/2018     Lab Results   Component Value Date    WBC 6.3 03/27/2018    HGB 13.7 03/27/2018    HCT 41.0 03/27/2018    MCV 92 03/27/2018    PLT 210 03/27/2018       Medical Problems:  Patient Active Problem List    Diagnosis Date Noted   . Adjustment and management of cardiac pacemaker 07/08/2018   . Class 2 severe obesity with serious comorbidity and body mass index (BMI) of 39.0 to 39.9 in adult, unspecified obesity type 07/08/2018   .  SSS (sick sinus syndrome) 07/08/2018   . PVC (premature ventricular contraction) 07/08/2018   . Chest pain 03/26/2018   . NICM (nonischemic cardiomyopathy) 10/20/2017   . Chronic combined systolic and diastolic HF (heart failure) 10/20/2017   . Obesity, morbid, BMI 40.0-49.9 10/07/2017   . DOE (dyspnea on exertion) 10/06/2017   . Hyperglycemia 03/29/2017   . Essential hypertension 03/29/2017   . Combined systolic and diastolic congestive heart failure 03/29/2017   . Dehydration, moderate 03/29/2017   . Anemia, unspecified type 03/29/2017   . Renal insufficiency 03/29/2017   . Near syncope 03/28/2017       History from Cardiology at [facility]  1. Chronic combined systolic  and diastolic CHF  2. Nonischemic cardiomyopathy  A. LHC (6/17): 30% mid RCA, no other significant disease.   B. Echo (6/17): EF 30-35%.   C. Holter (7/17): 19% PVCs.   D. Echo (3/18): EF 50-55%, moderate LVH, grade II diastolic dysfunction.   E. Echo (9/18): EF 50-55%  F. Echo 10/07/17- LV cavity size is increased. The LVEF is est. at 50%. Mild hypokinesis of the distal septum and apex which may be due to paced rhythm. Pacer wire present in RV.   G. Echo 06/12/2018- Mild concentric LVH, EF 50-55%, aortic root is mildy dilated, aortic root is 3.9cm, tricuspid regurgitation is mild, RSVP .     3. Type II diabetes  4. Hypertriglyceridemia  5. PVCs  A. 48 hour holter (7/17) with 19% PVCs.  B. PVC ablation in 8/17 but PVCs recurred. Will   C. Holter (11/17) with rare PVCs, < 1%.   D. Holter (9/18) with rare PVCs only  6. Bradycardia/ SSS  A. Dual chamber pacemaker placed (Medtronic, MRI compatible).     7. NSVT  8. Acute Coronary Syndrome   A. Echo 03/27/2018- Technically difficult study with limited endocardial definition and structural resolution in the apical views, Intravenous contrast was administered to enhance endocardial definition, LV cavity size is norma, mild concentric left ventricular hypertrophy, LV systolic function is at the lower limits of normal, EF 50%, no regional wall motion abnormalities present, LV diastolic filling pattern is probably pseudonormalized, consistent with grade 2 left ventricular diastolic dysfunction and elevated left atrial pressure, Right-sided chambers are not optimally visualized but appear grossly normal in size, Pacemaker lead is visualized within the right heart chamber, no significant valvular heart disease.   B. Stress 03/28/2018- Completed vasodilator protocol., No ischemic changes with infusion, No arrhythmias, Overall, low risk vasodilator stress rest SPECT perfusion study, a large moderate to severe perfusion defect involving the apex, inferior stripe, mid and basal  inferoseptal and inferolateral segments with mild reversibility suggestive of possible ischemia but with likely strong component of diaphragmatic attenuation artifact, Also anteroseptal perfusion defect from attenuation artifact, No significant findings of ischemia Noted, Dilated LV with mildly decreased left ventricular function, stress EF 44%.   C. THC 03/30/2018- Negative heart cath.     Past Medical History:   Diagnosis Date   . Cardiomyopathy    . Congestive heart failure    . Diabetes mellitus    . Hypertension    . PVC's (premature ventricular contractions)      Past Surgical History:   Procedure Laterality Date   . APPENDECTOMY     . CARDIAC PACEMAKER PLACEMENT     . RIGHT & LEFT HEART CATH  W/ CORONARY ANGIOS, LV/LA Right 03/30/2018    Procedure: RIGHT & LEFT HEART CATH  W/ CORONARY ANGIOS, LV/LA;  Surgeon:  Santo Held, MD;  Location[facility] CATH/EP;  Service: Cardiovascular;  Laterality: Right;     Family History   Problem Relation Age of Onset   . Heart disease Father    . Hypertension Father    . Heart disease Sister    . Heart attack Sister    . Heart disease Brother    . Diabetes Brother    . Diabetes Paternal Grandmother      Social History     Socioeconomic History   . Marital status: Married     Spouse name: Not on file   . Number of children: Not on file   . Years of education: Not on file   . Highest education level: Not on file   Occupational History   . Not on file   Social Needs   . Financial resource strain: Not on file   . Food insecurity     Worry: Not on file     Inability: Not on file   . Transportation needs     Medical: Not on file     Non-medical: Not on file   Tobacco Use   . Smoking status: Former Games developer   . Smokeless tobacco: Former Estate agent and Sexual Activity   . Alcohol use: No   . Drug use: No   . Sexual activity: Not on file     Comment: Deferred   Lifestyle   . Physical activity     Days per week: Not on file     Minutes per session: Not on file   . Stress: Not on file    Relationships   . Social Wellsite geologist on phone: Not on file     Gets together: Not on file     Attends religious service: Not on file     Active member of club or organization: Not on file     Attends meetings of clubs or organizations: Not on file     Relationship status: Not on file   . Intimate partner violence     Fear of current or ex partner: Not on file     Emotionally abused: Not on file     Physically abused: Not on file     Forced sexual activity: Not on file   Other Topics Concern   . Not on file   Social History Narrative   . 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 atorvastatin (LIPITOR) 20 MG tablet Take 10 mg by mouth daily     . insulin lispro, 1 Unit Dial, (HUMALOG KWIKPEN) 100 UNIT/ML Solution Pen-injector injection pen Inject 8 Unit into the skin 3 (three) times daily before meals 3 mL 1   . Insulin Pen Needle (PEN NEEDLES 3/16") 31G X 5 MM Misc 1 each by Does not apply route 4 (four) times daily 901 each 1   . metFORMIN (GLUCOPHAGE) 1000 MG tablet Take 1,000 mg by mouth every morning with breakfast     . metoprolol succinate XL (TOPROL XL) 50 MG 24 hr tablet Take 1 tablet (50 mg total) by mouth nightly 90 tablet 2   . potassium chloride (KLOR-CON) 10 MEQ tablet  10 MEQ when doubling lasix dose     . bumetanide (BUMEX) 1 MG tablet Take 1 tablet (1 mg total) by mouth 2 (two) times daily 60 tablet 0     No current facility-administered medications for this encounter.      No Known Allergies    Vitals:    Vitals:    10/02/18 1024   BP: 119/82   Pulse: 88   Resp: 18   SpO2: 98%       Physical Exam:   General: overweight male, alert, NAD  HEENT:  Anicteric sclera, supple, no significant JVD.  Lungs: Bibasilar decreased air entry, no crackles or rails.   Cardiovascular: s1s2,  normal rate, regular rhythm, no m/r/g  Abdominal: Soft, obese/distended, nontender  Extremities: warm to touch, no edema  Neuromuscular exam: grossly non-focal, though not formally tested.    Assessment and Plan:  1. Diabetes mellitus without complication  Ambulatory referral to Endocrinology   2. Chronic combined systolic and diastolic congestive heart failure  Ambulatory referral to Endocrinology   3. Obesity, morbid, BMI 40.0-49.9  Ambulatory referral to Endocrinology   4. Chronic combined systolic and diastolic HF (heart failure)         Cardiomyopathy:   My impression is that, Mr. Fanelli has nonischemic cardiomyopathy, chronic systolic/diastolic heart failure ACC/AHA stage C, class III with improved EF.  He is on fair neurohormonal heart for medical therapy, but of appears to have difficulty with beta-blocker.    From a heart failure standpoint appears to have some decompensation with fluid retention.  He may have some degree of resistance against Lasix so at this point would recommend switching to another loop diuretic.  We will start him on Bumex 1 mg twice a day, he should stop Lasix.    Have instructed him to continue daily weights.  We will check in with him next week to see if he has had any improvement with the addition of Bumex.    His blood sugar today was 244 despite insulin, reports he has been fasting.  He has not seen endocrinology.  Called Dr. Madlyn Frankel office and was able to obtain a new patient appointment for October 1 with her NP.    In regards to OMM:  Beta-blocker: yes, toprol XL  ACE-I/ARB/ARNI: yes, patient stopped taking  Aldosterone antagonist: no, patient stopped taking  Hydralazine/nitrate: no   Digoxin: no    We will see him back in 3 to 4 weeks.  We will do a follow-up phone call next week with him to see how he is doing on the Bumex, his goal weight should be around 295 pounds.    Electronically signed by:  Toma Deiters, AGACNP-BC  VAD Program Coordinator  Advanced Heart Failure  Program    [phone] (office) M-F 8a-5p; Pager# [phone] (HF Clinic)    [facility]  [address]  [place], [place] [address]       Note: This chart was generated by the [facility] EMR system/speech recognition and may contain inherit omission or errors not intended by the user.  Grammatical errors, random word insertions, deletions, pronoun errors and incomplete sentences are occasionally consequences of this technology due to software limitations.  Not all errors are caught or corrected.  If there are questions or concerns about the content of this note or information contained in the body of this dictation they should be addressed directly with the author for clarification.

## 2018-10-08 ENCOUNTER — Other Ambulatory Visit: Payer: Self-pay | Admitting: Cardiovascular Disease

## 2018-10-09 ENCOUNTER — Telehealth: Payer: Self-pay

## 2018-10-09 NOTE — Telephone Encounter (Signed)
Attempted to contact patient to follow up on switch to bumex. No answer LVM asking patient to call back and update staff on weight and symptoms.

## 2018-10-19 ENCOUNTER — Telehealth: Payer: Self-pay

## 2018-10-19 NOTE — Telephone Encounter (Signed)
Patient called to report how he feels on the switch to bumex. He stated his "weight is coming down but he still feels like he has at least 5-7 lbs of fluid on me". He stated he is still sob and bloated but is putting out a lot of urine. He was advised to continue his meds as prescribed and we would do labs and adjustments at next visit on 9/21. He understands if his weight increases of he has increased symptoms he is to call clinic for possible med adjustment.

## 2018-10-26 ENCOUNTER — Ambulatory Visit: Payer: 59 | Admitting: Family Nurse Practitioner

## 2018-10-27 ENCOUNTER — Other Ambulatory Visit: Payer: Self-pay | Admitting: Family Nurse Practitioner

## 2018-10-27 ENCOUNTER — Telehealth: Payer: Self-pay

## 2018-10-27 ENCOUNTER — Other Ambulatory Visit: Payer: 59

## 2018-10-27 DIAGNOSIS — I428 Other cardiomyopathies: Secondary | ICD-10-CM

## 2018-10-27 DIAGNOSIS — I5042 Chronic combined systolic (congestive) and diastolic (congestive) heart failure: Secondary | ICD-10-CM

## 2018-10-27 DIAGNOSIS — I10 Essential (primary) hypertension: Secondary | ICD-10-CM

## 2018-10-27 NOTE — Progress Notes (Signed)
Order completed for patient to have lab work completed

## 2018-10-27 NOTE — Telephone Encounter (Signed)
Patient called stating that he went to get blood work and there was no order in the system. Spoke with provider will add the order.

## 2018-10-27 NOTE — Telephone Encounter (Signed)
Called lab to check on bloodwork. With his insurance they have to send to [facility].

## 2018-10-28 ENCOUNTER — Ambulatory Visit
Admission: RE | Admit: 2018-10-28 | Discharge: 2018-10-28 | Disposition: A | Discharge status: Home / Self Care | Payer: 59 | Source: Ambulatory Visit | Attending: Family | Admitting: Family

## 2018-10-28 ENCOUNTER — Encounter: Payer: Self-pay | Admitting: Family Nurse Practitioner

## 2018-10-28 VITALS — BP 128/89 | HR 88 | Resp 20 | Wt 322.0 lb

## 2018-10-28 DIAGNOSIS — I10 Essential (primary) hypertension: Secondary | ICD-10-CM

## 2018-10-28 DIAGNOSIS — I428 Other cardiomyopathies: Secondary | ICD-10-CM | POA: Insufficient documentation

## 2018-10-28 DIAGNOSIS — I5042 Chronic combined systolic (congestive) and diastolic (congestive) heart failure: Secondary | ICD-10-CM | POA: Insufficient documentation

## 2018-10-28 DIAGNOSIS — I110 Hypertensive heart disease with heart failure: Secondary | ICD-10-CM | POA: Insufficient documentation

## 2018-10-28 DIAGNOSIS — E119 Type 2 diabetes mellitus without complications: Secondary | ICD-10-CM

## 2018-10-28 LAB — I-STAT CHEM 8 CARTRIDGE
Anion Gap I-Stat: 19 — ABNORMAL HIGH (ref 7.0–16.0)
BUN I-Stat: 23 mg/dL — ABNORMAL HIGH (ref 7–22)
Calcium Ionized I-Stat: 5.1 mg/dL (ref 4.35–5.10)
Chloride I-Stat: 100 mMol/L (ref 98–110)
Creatinine I-Stat: 1 mg/dL (ref 0.80–1.30)
EGFR: 87 mL/min/{1.73_m2} (ref 60–150)
Glucose I-Stat: 216 mg/dL — ABNORMAL HIGH (ref 71–99)
Hematocrit I-Stat: 45 % (ref 39.0–52.5)
Hemoglobin I-Stat: 15.3 gm/dL (ref 13.0–17.5)
Potassium I-Stat: 4.4 mMol/L (ref 3.5–5.3)
Sodium I-Stat: 139 mMol/L (ref 136–147)
TCO2 I-Stat: 26 mMol/L (ref 24–29)

## 2018-10-28 MED ORDER — BUMETANIDE 1 MG PO TABS
2.0000 mg | ORAL_TABLET | Freq: Two times a day (BID) | ORAL | 3 refills | Status: DC
Start: 2018-10-28 — End: 2018-11-27

## 2018-10-28 MED ORDER — BUMETANIDE 0.25 MG/ML IJ SOLN
6.00 mg | Freq: Once | INTRAMUSCULAR | Status: AC
Start: 2018-10-28 — End: 2018-10-28
  Administered 2018-10-28: 10:00:00 6 mg via INTRAVENOUS
  Filled 2018-10-28: qty 24

## 2018-10-28 MED ORDER — POTASSIUM CHLORIDE CRYS ER 10 MEQ PO TBCR
10.00 meq | EXTENDED_RELEASE_TABLET | Freq: Every day | ORAL | 11 refills | Status: DC
Start: 2018-10-28 — End: 2019-05-24

## 2018-10-28 MED ORDER — METOPROLOL SUCCINATE ER 50 MG PO TB24
75.00 mg | ORAL_TABLET | Freq: Every evening | ORAL | 2 refills | Status: DC
Start: 2018-10-28 — End: 2019-05-24

## 2018-10-28 NOTE — Patient Instructions (Signed)
Heart failure causes many symptoms.   Some of them are more serious than others.   It is important to recognize when you should call 911 for emergency help and when you should call your doctor or nurse for urgent attention.    The following lists can help you decide what to do when you experience certain symptoms. You may want to print this page and keep it in a convenient spot.    Emergency Symptoms of Heart Failure  Call 911 for emergency help, if you have:    -Chest discomfort or pain that lasts more than 15 minutes that is not relieved with rest or nitroglycerin.  -Severe, persistent shortness of breath.  -Fainted or passed out.    Urgent Symptoms of Heart Failure  Call your doctor immediately, if you have any of the following symptoms:    -Increasing shortness of breath or a new shortness of breath while resting.  -Trouble sleeping due to difficulty breathing. For example, waking up suddenly at night due to difficulty breathing.  -A need to sleep sitting up or on more pillows than usual.  -Fast or irregular heart beats, palpitations, or a “racing heart” that persists and makes you feel dizzy or lightheaded.    Or if you:  -Cough up frothy or pink sputum.  -Feel like you may faint or pass out.    To manage your heart failure, please follow the following recommendations:    Fluid restriction:  You should follow a daily fluid restriction not to exceed 2 Liters daily (equivalent to 64 fluid ounces). This includes all things liquid at room temperature such as: all beverages, soup, ice cream, popsicles, jello and ice (you will need to guess how much the ice would be when melted and add toward your daily total). Keep track of your fluid intake throughout the day and measure everything.     Daily weights:  Weigh yourself every day when you wake up, right after using the bathroom. Keep a record of your daily weights and call our office if you gain more than 2-3 pounds in 1 day or 5 pounds in less than a week.     Low  sodium diet:  Sodium can lead to fluid retention and patients with heart failure should follow the low sodium diet, do not exceed 2000 mg sodium daily. Do not cook with salt or add it at the table. This includes table salt, sea salt, Kosher salt, garlic salt, seasoned salt or other seasoning's with salt (read the nutrition label). Avoid eating out at restaurants and high sodium foods such as processed meats, pre-packaged meals, pickles, soy sauce, chips, canned soups and bacon. Ask your healthcare provider for more information.    Activity:  We encourage you to engage in regular physical activity, such as daily walking. Start with short distances and walk at a comfortable pace. You should be able to "walk and talk". Gradually increase the distance and time that you walk. Exercise will help you feel better and can relieve the symptom of "heavy, tired legs". Stop exercise if you have chest pain, dizziness, or get so short of breath that you can not talk.   You should not lift/push/or pull heavy objects (anything heavy enough to make you strain).     Medications:  Take all your medications as prescribed by your health care provider and bring all your medication bottles to every appointment. It is important that you know what you are taking, when, and for what reason.      Medications you should not take:  Nonsteroidal anti-inflammatory drugs (NSAIDS): such as ibuprofen, Advil, Motrin, naprosyn, Naproxen, may cause fluid retention. You should not take these medications.    Call your healthcare provider if you are not feeling well or have new problems. We will call you back within 24 hours (Monday-Friday). In case of an emergency, call 911 or go to the nearest emergency department.      Thank you for choosing the [facility] at [facility] with [facility] for your care. It is important to keep all your medical appointments, even if you feel well. To reschedule or cancel an appointment  with our clinic, call [phone]. If you need to leave a message on the nurse line for the heart failure clinic, please call [phone].      Increase toprol xl 50 mg - 1 1/2 tablets at bedtime.      Continue bumex 1 mg - 2 tablets in am and 2 tablets in pm    Take potassium 10 mEq 1 tablet daily    Contact Primary Care Provider for refill of your insulin.

## 2018-10-28 NOTE — Progress Notes (Signed)
[facility]    I had the pleasure of seeing [name] today at [facility] for an advanced heart failure urgent work in appointment due to weight gain at home.     He has had increased awareness of PVCs over the last few days. Also noticed sudden weight gain with abdominal distention.  Notes decreased lower extremity edema.  He was recently switched from lasix to bumex.  Initially felt it was working well but now has increased weight.  He increased his bumex at home to 2 mg bid.  Is here today due for Istat labs and infusion of IV diuretics. Denies any dizziness.  Is preparing to travel for work.     Samuel Koch is a 48 y.o. male with nonischemic cardiomyopathy, chronic combined systolic and diastolic heart failure, latest EF 50%, NYHA Class 3, ACC/AHA Stage C.  Has dual-chamber pacemaker in place.  Status post PVC ablation.  Also uncontrolled type II diabetic, obesity.    Recent echo showed improvement of left ventricular function, EF now 55%. He continues with shortness of breath and fatigue and dry cough. Notes abdominal bloating.         Review of Systems   Constitution: Negative for appetite change,  fever, Positive for activity change and fatigue and unexpected weight change  HENT: Negative for trouble swallowing, and voice change  Eyes: Negative for any visual disturbances  Respiratory: Positive for sob  Cardiovascular: Positive for dyspnea and bloating and plapitations  Gastrointestinal: Negative for abdominal pain, blood in stool, nausea, and vomiting Positive for bloating   Endocrine: Negative for cold intolerance, heat intolerance, and polydipsia  GU: Negative for hematuria  Musculoskeletal: Negative for gait problems, joint swelling, neck pain, and pain or heaviness in legs  Skin: Negative for rash  Neurological: Negative for dizziness, headaches, and syncope  Hematologic: Negative for bruises  Psychiatric: Negative for anxiety,  and sleep disturbance    Comprehensive review of systems performed by me. Unless otherwise noted, all systems are negative.    Istat  Sodium 139  Potassium 4.4  CO2 26  Glucose 260  BUN 23  Creatinine 1.0  Hemoglobin 15.3  Hematocrit 45    Most recent laboratory testing reveals:   LABORATORY DATA:  Lab Results   Component Value Date    BUN 20 03/27/2018    NA 136 03/27/2018    K 4.1 03/27/2018    CL 103 03/27/2018    CO2 28 03/27/2018     Lab Results   Component Value Date    WBC 6.3 03/27/2018    HGB 13.7 03/27/2018    HCT 41.0 03/27/2018    MCV 92 03/27/2018    PLT 210 03/27/2018       Medical Problems:  Patient Active Problem List    Diagnosis Date Noted    Adjustment and management of cardiac pacemaker 07/08/2018    Class 2 severe obesity with serious comorbidity and body mass index (BMI) of 39.0 to 39.9 in adult, unspecified obesity type 07/08/2018    SSS (sick sinus syndrome) 07/08/2018    PVC (premature ventricular contraction) 07/08/2018    Chest pain 03/26/2018    NICM (nonischemic cardiomyopathy) 10/20/2017    Chronic combined systolic and diastolic HF (heart failure) 10/20/2017    Obesity, morbid, BMI 40.0-49.9 10/07/2017    DOE (dyspnea on exertion) 10/06/2017    Hyperglycemia 03/29/2017    Essential hypertension 03/29/2017    Renal insufficiency 03/29/2017  NSVT (nonsustained ventricular tachycardia) 08/17/2015    DM type 2 with diabetic dyslipidemia 07/31/2015       History from Cardiology at [facility] [place]  1. Chronic combined systolic and diastolic CHF  2. Nonischemic cardiomyopathy  A. LHC (6/17): 30% mid RCA, no other significant disease.   B. Echo (6/17): EF 30-35%.   C. Holter (7/17): 19% PVCs.   D. Echo (3/18): EF 50-55%, moderate LVH, grade II diastolic dysfunction.   E. Echo (9/18): EF 50-55%  F. Echo 10/07/17- LV cavity size is increased. The LVEF is est. at 50%. Mild hypokinesis of the distal septum and apex which may be due to paced rhythm. Pacer wire present in RV.   G. Echo  06/12/2018- Mild concentric LVH, EF 50-55%, aortic root is mildy dilated, aortic root is 3.9cm, tricuspid regurgitation is mild, RSVP .     3. Type II diabetes  4. Hypertriglyceridemia  5. PVCs  A. 48 hour holter (7/17) with 19% PVCs.  B. PVC ablation in 8/17 but PVCs recurred. Will   C. Holter (11/17) with rare PVCs, < 1%.   D. Holter (9/18) with rare PVCs only  6. Bradycardia/ SSS  A. Dual chamber pacemaker placed (Medtronic, MRI compatible).     7. NSVT  8. Acute Coronary Syndrome   A. Echo 03/27/2018- EF 50%, no regional wall motion abnormalities present, grade 2 DD.  no significant valvular heart disease.   B. Stress 03/28/2018- Completed vasodilator protocol., No ischemic changes with infusion, No arrhythmias,low risk. Perfusion defect noted thought to be diaphragmatic attenuation  Dilated LV with mildly decreased left ventricular function, stress EF 44%.   C. THC 03/30/2018- Non obstructive cad.  Echo output 5 cardiac index 2 wedge 14 mean PA 22    Past Medical History:   Diagnosis Date    Cardiomyopathy     Congestive heart failure     Diabetes mellitus     Hypertension     PVC's (premature ventricular contractions)      Past Surgical History:   Procedure Laterality Date    APPENDECTOMY      CARDIAC PACEMAKER PLACEMENT      RIGHT & LEFT HEART CATH  W/ CORONARY ANGIOS, LV/LA Right 03/30/2018    Procedure: RIGHT & LEFT HEART CATH  W/ CORONARY ANGIOS, LV/LA;  Surgeon: Santo Held, MD;  Location: [facility] CATH/EP;  Service: Cardiovascular;  Laterality: Right;     Family History   Problem Relation Age of Onset    Heart disease Father     Hypertension Father     Heart disease Sister     Heart attack Sister     Heart disease Brother     Diabetes Brother     Diabetes Paternal Grandmother      Social History     Socioeconomic History    Marital status: Married     Spouse name: Not on file    Number of children: Not on file    Years of education: Not on file    Highest education level: Not on file    Occupational History    Not on file   Social Needs    Financial resource strain: Not on file    Food insecurity     Worry: Not on file     Inability: Not on file    Transportation needs     Medical: Not on file     Non-medical: Not on file   Tobacco Use  Smoking status: Former Smoker    Smokeless tobacco: Former Estate agent and Sexual Activity    Alcohol use: No    Drug use: No    Sexual activity: Not on file     Comment: Deferred   Lifestyle    Physical activity     Days per week: Not on file     Minutes per session: Not on file    Stress: Not on file   Relationships    Social connections     Talks on phone: Not on file     Gets together: Not on file     Attends religious service: Not on file     Active member of club or organization: Not on file     Attends meetings of clubs or organizations: Not on file     Relationship status: Not on file    Intimate partner violence     Fear of current or ex partner: Not on file     Emotionally abused: Not on file     Physically abused: Not on file     Forced sexual activity: Not on file   Other Topics Concern    Not on file   Social History Narrative    Not on file     Married   Non smoker - isolated smoking before age 74  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      Current Outpatient Medications   Medication Sig Dispense Refill    atorvastatin (LIPITOR) 20 MG tablet Take 10 mg by mouth daily      bumetanide (BUMEX) 1 MG tablet Take 2 tablets (2 mg total) by mouth 2 (two) times daily 120 tablet 3    insulin lispro, 1 Unit Dial, (HUMALOG KWIKPEN) 100 UNIT/ML Solution Pen-injector injection pen Inject 8 Unit into the skin 3 (three) times daily before meals 3 mL 1    Insulin Pen Needle (PEN NEEDLES 3/16") 31G X 5 MM Misc 1 each by Does not apply route 4 (four) times daily 901 each 1    metFORMIN (GLUCOPHAGE) 1000 MG tablet Take 1,000 mg by mouth every morning with breakfast       metoprolol succinate XL (Toprol XL) 50 MG 24 hr tablet Take 1.5 tablets (75 mg total) by mouth nightly 45 tablet 2    potassium chloride (KLOR-CON) 10 MEQ tablet Take 1 tablet (10 mEq total) by mouth daily dose 30 tablet 11     Current Facility-Administered Medications   Medication Dose Route Frequency Provider Last Rate Last Dose    bumetanide 6 mg in sodium chloride 0.9 % 50 mL IV Piggyback  6 mg Intravenous Once Inez Catalina I, NP         No Known Allergies    Vitals:    Vitals:    10/28/18 0923   BP: 128/89   Pulse: 88   Resp: 20   SpO2: 100%     Weight is up 6 lbs     Physical Exam:   General: overweight male, alert, NAD  HEENT:  Anicteric sclera, supple, no significant JVD.  Lungs: Bibasilar decreased air entry, no crackles or rails.   Cardiovascular: s1s2, normal rate, regular rhythm, no m/r/g  Abdominal: obese/distended, nontender  Extremities: warm to touch, no edema  Neuromuscular exam: grossly non-focal, though not formally tested.    Assessment and Plan:  1. Chronic combined systolic and  diastolic congestive heart failure     2. NICM (nonischemic cardiomyopathy)     3. Essential hypertension     4. Diabetes mellitus without complication         Cardiomyopathy:   My impression is that, Mr. Sheeran has nonischemic cardiomyopathy, chronic systolic/diastolic heart failure ACC/AHA stage C, class III with improved EF.  He is on fair neurohormonal heart for medical therapy    From a heart failure standpoint appears decompensated with recent weight gain.  He has significant abdominal distention.  Will continue increased dose of Bumex 2 mg twice a day with 10 mEq of oral potassium daily.  Provided IV Bumex infusion of 6 mg in clinic today.  Is scheduled for repeat office visit on September 28.  We will follow-up on his response to diuretic therapy.  May need addition of chlorthalidone or metolazone.    Increase Toprol XL to 75 mg at bedtime to decrease awareness of PVCs.  Last device check on September 3 did  show some elevated heart rates.    Have instructed him to continue daily weights.  We will check in with him next week to see if he has had any improvement with the addition of Bumex.    Patient tolerated infusion. He did void 2 times during infusion. He did not know if she felt decreased bloating. Has scheduled follow up next week. We will re-evaluate.       In regards to OMM:  Beta-blocker: yes, toprol XL  ACE-I/ARB/ARNI: no, patient stopped taking  Aldosterone antagonist: no, patient stopped taking  Hydralazine/nitrate: no   Digoxin: no    Ef has improved. May benefit from resuming spironolactone at future visit. He is going to be traveling soon so I believe waiting is reasonable.       Electronically signed by:  Deatra James. Georg Ruddle, FNP-C  Clinical Coordinator for [facility]  [facility] - Heart and Vascular Center  [facility]  [address]  [place], [place] [address]  [phone]   [phone] - portable phone  [phone] - fax               Note: This chart was generated by the [facility] EMR system/speech recognition and may contain inherit omission or errors not intended by the user.  Grammatical errors, random word insertions, deletions, pronoun errors and incomplete sentences are occasionally consequences of this technology due to software limitations.  Not all errors are caught or corrected.  If there are questions or concerns about the content of this note or information contained in the body of this dictation they should be addressed directly with the author for clarification.

## 2018-10-30 ENCOUNTER — Telehealth: Payer: Self-pay

## 2018-10-30 NOTE — Telephone Encounter (Signed)
Reminder call for appt on 9/28 completed, no answer LVM

## 2018-11-02 ENCOUNTER — Encounter: Payer: Self-pay | Admitting: Family Nurse Practitioner

## 2018-11-02 ENCOUNTER — Ambulatory Visit (HOSPITAL_BASED_OUTPATIENT_CLINIC_OR_DEPARTMENT_OTHER)
Admission: RE | Admit: 2018-11-02 | Discharge: 2018-11-02 | Disposition: A | Discharge status: Home / Self Care | Payer: 59 | Source: Ambulatory Visit | Admitting: Family Nurse Practitioner

## 2018-11-02 VITALS — BP 134/88 | HR 75 | Resp 16 | Wt 316.0 lb

## 2018-11-02 DIAGNOSIS — I5042 Chronic combined systolic (congestive) and diastolic (congestive) heart failure: Secondary | ICD-10-CM

## 2018-11-02 DIAGNOSIS — E119 Type 2 diabetes mellitus without complications: Secondary | ICD-10-CM

## 2018-11-02 DIAGNOSIS — I428 Other cardiomyopathies: Secondary | ICD-10-CM

## 2018-11-02 DIAGNOSIS — I10 Essential (primary) hypertension: Secondary | ICD-10-CM

## 2018-11-02 LAB — I-STAT CHEM 8 CARTRIDGE
Anion Gap I-Stat: 17 — ABNORMAL HIGH (ref 7.0–16.0)
BUN I-Stat: 24 mg/dL — ABNORMAL HIGH (ref 7–22)
Calcium Ionized I-Stat: 5 mg/dL (ref 4.35–5.10)
Chloride I-Stat: 103 mMol/L (ref 98–110)
Creatinine I-Stat: 1.1 mg/dL (ref 0.80–1.30)
EGFR: 78 mL/min/{1.73_m2} (ref 60–150)
Glucose I-Stat: 213 mg/dL — ABNORMAL HIGH (ref 71–99)
Hematocrit I-Stat: 45 % (ref 39.0–52.5)
Hemoglobin I-Stat: 15.3 gm/dL (ref 13.0–17.5)
Potassium I-Stat: 4.3 mMol/L (ref 3.5–5.3)
Sodium I-Stat: 141 mMol/L (ref 136–147)
TCO2 I-Stat: 25 mMol/L (ref 24–29)

## 2018-11-02 MED ORDER — FUROSEMIDE 10 MG/ML IJ SOLN
160.00 mg | Freq: Once | INTRAMUSCULAR | Status: AC
Start: 2018-11-02 — End: 2018-11-02
  Administered 2018-11-02: 10:00:00 160 mg via INTRAVENOUS
  Filled 2018-11-02: qty 16

## 2018-11-02 NOTE — Progress Notes (Signed)
[facility]    I had the pleasure of seeing [name] today at [facility] for an advanced heart failure follow up from his urgent work in appointment and infusion of IV bumex 6 mg over 1 hour.  His weight continues to fluctuate at home. He notes he is down this am. Does admit to weight fluctuations since his infusion.  He is here today with his wife.      At last office visit, he bumex was continued at increased dose and his toprol xl was increased to 75 mg. Unfortunately he ran out of bumex but did restart his lasix 80 mg bid for the last 3 days. He states his pharmacy was out of the medication and it was being ordered. He is supposed to pick it up today. He does not know if bumex works better for him or not. He continues with abdominal bloating.  His wife has noticed decreased exercise tolerance. Continues to have issues with obtaining refill of diabetes medications - insulin. He is supposed to start traveling in a few days and will be gone for the better part of the month of October.      [name] is a 48 y.o. male with nonischemic cardiomyopathy, chronic combined systolic and diastolic heart failure, latest EF 50-55%, NYHA Class 3, ACC/AHA Stage C.  Has dual-chamber pacemaker in place.  Status post PVC ablation.  Also uncontrolled type II diabetic, obesity.          Review of Systems   Constitution: Negative for appetite change,  fever, Positive for activity change and fatigue and unexpected weight change  HENT: Negative for trouble swallowing, and voice change  Eyes: Negative for any visual disturbances  Respiratory: Negative for sob.   Cardiovascular: Positive for dyspnea and bloating and plapitations  Gastrointestinal: Negative for abdominal pain, blood in stool, nausea, and vomiting Positive for bloating   Endocrine: Negative for cold intolerance, heat intolerance, and polydipsia  GU: Negative for hematuria  Musculoskeletal:  Negative for gait problems, joint swelling, neck pain, and pain or heaviness in legs  Skin: Negative for rash  Neurological: Negative for dizziness, headaches, and syncope  Hematologic: Negative for bruises  Psychiatric: Negative for anxiety, and sleep disturbance    Comprehensive review of systems performed by me. Unless otherwise noted, all systems are negative.      Most recent laboratory testing reveals:   LABORATORY DATA:  Lab Results   Component Value Date    BUN 20 03/27/2018    NA 136 03/27/2018    K 4.1 03/27/2018    CL 103 03/27/2018    CO2 28 03/27/2018     Lab Results   Component Value Date    WBC 6.3 03/27/2018    HGB 13.7 03/27/2018    HCT 41.0 03/27/2018    MCV 92 03/27/2018    PLT 210 03/27/2018       Medical Problems:  Patient Active Problem List    Diagnosis Date Noted    Adjustment and management of cardiac pacemaker 07/08/2018    Class 2 severe obesity with serious comorbidity and body mass index (BMI) of 39.0 to 39.9 in adult, unspecified obesity type 07/08/2018    SSS (sick sinus syndrome) 07/08/2018    PVC (premature ventricular contraction) 07/08/2018    Chest pain 03/26/2018    NICM (nonischemic cardiomyopathy) 10/20/2017    Chronic combined systolic and diastolic HF (heart failure) 10/20/2017    Obesity,  morbid, BMI 40.0-49.9 10/07/2017    DOE (dyspnea on exertion) 10/06/2017    Hyperglycemia 03/29/2017    Essential hypertension 03/29/2017    Renal insufficiency 03/29/2017    NSVT (nonsustained ventricular tachycardia) 08/17/2015    DM type 2 with diabetic dyslipidemia 07/31/2015       History from Cardiology at [facility] [place]  1. Chronic combined systolic and diastolic CHF  2. Nonischemic cardiomyopathy  A. LHC (6/17): 30% mid RCA, no other significant disease.   B. Echo (6/17): EF 30-35%.   C. Holter (7/17): 19% PVCs.   D. Echo (3/18): EF 50-55%, moderate LVH, grade II diastolic dysfunction.   E. Echo (9/18): EF 50-55%  F. Echo 10/07/17- LV cavity size is increased. The LVEF  is est. at 50%. Mild hypokinesis of the distal septum and apex which may be due to paced rhythm. Pacer wire present in RV.   G. Echo 06/12/2018- Mild concentric LVH, EF 50-55%, aortic root is mildy dilated, aortic root is 3.9cm, tricuspid regurgitation is mild, RSVP .     3. Type II diabetes  4. Hypertriglyceridemia  5. PVCs  A. 48 hour holter (7/17) with 19% PVCs.  B. PVC ablation in 8/17 but PVCs recurred. Will   C. Holter (11/17) with rare PVCs, < 1%.   D. Holter (9/18) with rare PVCs only  6. Bradycardia/ SSS  A. Dual chamber pacemaker placed (Medtronic, MRI compatible).     7. NSVT  8. Acute Coronary Syndrome   A. Echo 03/27/2018- EF 50%, no regional wall motion abnormalities present, grade 2 DD.  no significant valvular heart disease.   B. Stress 03/28/2018- Completed vasodilator protocol., No ischemic changes with infusion, No arrhythmias,low risk. Perfusion defect noted thought to be diaphragmatic attenuation  Dilated LV with mildly decreased left ventricular function, stress EF 44%.   C. THC 03/30/2018- Non obstructive cad.  Echo output 5 cardiac index 2 wedge 14 mean PA 22    Past Medical History:   Diagnosis Date    Cardiomyopathy     Congestive heart failure     Diabetes mellitus     Hypertension     PVC's (premature ventricular contractions)      Past Surgical History:   Procedure Laterality Date    APPENDECTOMY      CARDIAC PACEMAKER PLACEMENT      RIGHT & LEFT HEART CATH  W/ CORONARY ANGIOS, LV/LA Right 03/30/2018    Procedure: RIGHT & LEFT HEART CATH  W/ CORONARY ANGIOS, LV/LA;  Surgeon: Santo Held, MD;  Location: [facility] CATH/EP;  Service: Cardiovascular;  Laterality: Right;     Family History   Problem Relation Age of Onset    Heart disease Father     Hypertension Father     Heart disease Sister     Heart attack Sister     Heart disease Brother     Diabetes Brother     Diabetes Paternal Grandmother      Social History     Socioeconomic History    Marital status: Married     Spouse  name: Not on file    Number of children: Not on file    Years of education: Not on file    Highest education level: Not on file   Occupational History    Not on file   Social Needs    Financial resource strain: Not on file    Food insecurity     Worry: Not on file  Inability: Not on file    Transportation needs     Medical: Not on file     Non-medical: Not on file   Tobacco Use    Smoking status: Former Smoker    Smokeless tobacco: Former Neurosurgeon   Substance and Sexual Activity    Alcohol use: No    Drug use: No    Sexual activity: Not on file     Comment: Deferred   Lifestyle    Physical activity     Days per week: Not on file     Minutes per session: Not on file    Stress: Not on file   Relationships    Social connections     Talks on phone: Not on file     Gets together: Not on file     Attends religious service: Not on file     Active member of club or organization: Not on file     Attends meetings of clubs or organizations: Not on file     Relationship status: Not on file    Intimate partner violence     Fear of current or ex partner: Not on file     Emotionally abused: Not on file     Physically abused: Not on file     Forced sexual activity: Not on file   Other Topics Concern    Not on file   Social History Narrative    Not on file     Married   Non smoker - isolated smoking before age 25  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      Current Outpatient Medications   Medication Sig Dispense Refill    atorvastatin (LIPITOR) 20 MG tablet Take 10 mg by mouth daily      bumetanide (BUMEX) 1 MG tablet Take 2 tablets (2 mg total) by mouth 2 (two) times daily 120 tablet 3    insulin glargine (LANTUS) 100 UNIT/ML injection Inject 20 Units into the skin every 12 (twelve) hours      insulin lispro, 1 Unit Dial, (HUMALOG KWIKPEN) 100 UNIT/ML Solution Pen-injector injection pen Inject 8 Unit into the skin 3 (three) times  daily before meals 3 mL 1    Insulin Pen Needle (PEN NEEDLES 3/16") 31G X 5 MM Misc 1 each by Does not apply route 4 (four) times daily 901 each 1    metFORMIN (GLUCOPHAGE) 1000 MG tablet Take 1,000 mg by mouth every morning with breakfast      metoprolol succinate XL (Toprol XL) 50 MG 24 hr tablet Take 1.5 tablets (75 mg total) by mouth nightly 45 tablet 2    potassium chloride (KLOR-CON) 10 MEQ tablet Take 1 tablet (10 mEq total) by mouth daily dose 30 tablet 11     No current facility-administered medications for this encounter.      No Known Allergies    Vitals:    Vitals:    11/02/18 0953   BP: 134/88   Pulse: 75   Resp: 16   SpO2: 99%     Weight is down 6 lbs from last office visit.     Physical Exam:   General: overweight male, alert, NAD  HEENT:  Anicteric sclera, supple, no significant JVD.  Lungs: Bibasilar decreased air entry, no crackles or rails.   Cardiovascular: s1s2, normal rate, regular rhythm, no m/r/g  Abdominal: obese/distended, nontender  Extremities: warm to touch,  no edema  Neuromuscular exam: grossly non-focal, though not formally tested.    Assessment and Plan:  1. Chronic combined systolic and diastolic congestive heart failure     2. NICM (nonischemic cardiomyopathy)     3. Essential hypertension     4. Diabetes mellitus without complication         Cardiomyopathy:   My impression is that, Mr. Bannister has nonischemic cardiomyopathy, chronic systolic/diastolic heart failure ACC/AHA stage C, class III with improved EF.  He is on fair neurohormonal heart for medical therapy      Have instructed him to continue daily weights. encouraged him to increase his toprol xl 75 mg daily as instructed at last office visit.  Provided iv lasix 160 mg in clinic.  Continue current dose of bumex 2 mg bid.  Our clinic has called his primary care provider and requested they send a refill of his insulin.  Due to wife concerns of poor exercise tolerance, I have encouraged patient to increase walking regularly.  Can re-evaluate if monitored exercise program could be helpful. Need to improve glycemic control.       In regards to OMM:  Beta-blocker: yes, toprol XL  ACE-I/ARB/ARNI: no, patient stopped taking  Aldosterone antagonist: no, patient stopped taking  Hydralazine/nitrate: no   Digoxin: no    Ef has improved. May benefit from resuming spironolactone at future visit. He is going to be traveling soon so I believe waiting is reasonable.       Electronically signed by:  Deatra James. Georg Ruddle, FNP-C  Clinical Coordinator for [facility]  [facility] - Heart and V[facility] Center  [facility]  [address]  [place], [place] [address]  [phone]   [phone] - portable phone  [phone] - fax               Note: This chart was generated by the [facility] EMR system/speech recognition and may contain inherit omission or errors not intended by the user.  Grammatical errors, random word insertions, deletions, pronoun errors and incomplete sentences are occasionally consequences of this technology due to software limitations.  Not all errors are caught or corrected.  If there are questions or concerns about the content of this note or information contained in the body of this dictation they should be addressed directly with the author for clarification.

## 2018-11-02 NOTE — Patient Instructions (Addendum)
Heart failure causes many symptoms.   Some of them are more serious than others.   It is important to recognize when you should call 911 for emergency help and when you should call your doctor or nurse for urgent attention.    The following lists can help you decide what to do when you experience certain symptoms. You may want to print this page and keep it in a convenient spot.    Emergency Symptoms of Heart Failure  Call 911 for emergency help, if you have:    -Chest discomfort or pain that lasts more than 15 minutes that is not relieved with rest or nitroglycerin.  -Severe, persistent shortness of breath.  -Fainted or passed out.    Urgent Symptoms of Heart Failure  Call your doctor immediately, if you have any of the following symptoms:    -Increasing shortness of breath or a new shortness of breath while resting.  -Trouble sleeping due to difficulty breathing. For example, waking up suddenly at night due to difficulty breathing.  -A need to sleep sitting up or on more pillows than usual.  -Fast or irregular heart beats, palpitations, or a “racing heart” that persists and makes you feel dizzy or lightheaded.    Or if you:  -Cough up frothy or pink sputum.  -Feel like you may faint or pass out.    To manage your heart failure, please follow the following recommendations:    Fluid restriction:  You should follow a daily fluid restriction not to exceed 2 Liters daily (equivalent to 64 fluid ounces). This includes all things liquid at room temperature such as: all beverages, soup, ice cream, popsicles, jello and ice (you will need to guess how much the ice would be when melted and add toward your daily total). Keep track of your fluid intake throughout the day and measure everything.     Daily weights:  Weigh yourself every day when you wake up, right after using the bathroom. Keep a record of your daily weights and call our office if you gain more than 2-3 pounds in 1 day or 5 pounds in less than a week.     Low  sodium diet:  Sodium can lead to fluid retention and patients with heart failure should follow the low sodium diet, do not exceed 2000 mg sodium daily. Do not cook with salt or add it at the table. This includes table salt, sea salt, Kosher salt, garlic salt, seasoned salt or other seasoning's with salt (read the nutrition label). Avoid eating out at restaurants and high sodium foods such as processed meats, pre-packaged meals, pickles, soy sauce, chips, canned soups and bacon. Ask your healthcare provider for more information.    Activity:  We encourage you to engage in regular physical activity, such as daily walking. Start with short distances and walk at a comfortable pace. You should be able to "walk and talk". Gradually increase the distance and time that you walk. Exercise will help you feel better and can relieve the symptom of "heavy, tired legs". Stop exercise if you have chest pain, dizziness, or get so short of breath that you can not talk.   You should not lift/push/or pull heavy objects (anything heavy enough to make you strain).     Medications:  Take all your medications as prescribed by your health care provider and bring all your medication bottles to every appointment. It is important that you know what you are taking, when, and for what reason.      Medications you should not take:  Nonsteroidal anti-inflammatory drugs (NSAIDS): such as ibuprofen, Advil, Motrin, naprosyn, Naproxen, may cause fluid retention. You should not take these medications.    Call your healthcare provider if you are not feeling well or have new problems. We will call you back within 24 hours (Monday-Friday). In case of an emergency, call 911 or go to the nearest emergency department.      Thank you for choosing the [facility] Failure Center at [facility] with [facility] for your care. It is important to keep all your medical appointments, even if you feel well. To reschedule or cancel an appointment  with our clinic, call [phone]. If you need to leave a message on the nurse line for the heart failure clinic, please call [phone].      Increase toprol xl 50 mg - 1 1/2 tablets daily - 75 mg daily.     Continue bumex 2 mg in am and 2 mg in afternoon.      Increase walking daily.     While you are traveling, feel free to contact our clinic to review your symptoms.    We have called your primary care provider to request they send in a refill of your insulin.

## 2018-11-03 ENCOUNTER — Other Ambulatory Visit: Payer: Self-pay | Admitting: Family Nurse Practitioner

## 2018-11-05 ENCOUNTER — Encounter (INDEPENDENT_AMBULATORY_CARE_PROVIDER_SITE_OTHER): Payer: Self-pay

## 2018-11-05 ENCOUNTER — Other Ambulatory Visit: Payer: 59

## 2018-11-05 ENCOUNTER — Ambulatory Visit: Payer: 59 | Attending: Registered Nurse | Admitting: Registered Nurse

## 2018-11-05 ENCOUNTER — Telehealth (INDEPENDENT_AMBULATORY_CARE_PROVIDER_SITE_OTHER): Payer: Self-pay

## 2018-11-05 ENCOUNTER — Other Ambulatory Visit (INDEPENDENT_AMBULATORY_CARE_PROVIDER_SITE_OTHER): Payer: Self-pay | Admitting: Registered Nurse

## 2018-11-05 ENCOUNTER — Encounter (INDEPENDENT_AMBULATORY_CARE_PROVIDER_SITE_OTHER): Payer: Self-pay | Admitting: Registered Nurse

## 2018-11-05 VITALS — BP 117/68 | HR 89 | Resp 18 | Ht 74.0 in | Wt 319.0 lb

## 2018-11-05 DIAGNOSIS — E1165 Type 2 diabetes mellitus with hyperglycemia: Secondary | ICD-10-CM

## 2018-11-05 DIAGNOSIS — G629 Polyneuropathy, unspecified: Secondary | ICD-10-CM

## 2018-11-05 DIAGNOSIS — E782 Mixed hyperlipidemia: Secondary | ICD-10-CM

## 2018-11-05 DIAGNOSIS — R1310 Dysphagia, unspecified: Secondary | ICD-10-CM

## 2018-11-05 DIAGNOSIS — Z794 Long term (current) use of insulin: Secondary | ICD-10-CM

## 2018-11-05 MED ORDER — GLUCOSE BLOOD VI STRP
ORAL_STRIP | 11 refills | Status: DC
Start: 2018-11-05 — End: 2020-08-28

## 2018-11-05 MED ORDER — LANCETS ULTRA FINE MISC
11 refills | Status: AC
Start: 2018-11-05 — End: ?

## 2018-11-05 MED ORDER — LANCETS ULTRA FINE MISC
11 refills | Status: DC
Start: 2018-11-05 — End: 2018-11-05

## 2018-11-05 MED ORDER — BLOOD GLUCOSE MONITOR WITH STRIPS (GENERIC)
PACK | 1 refills | Status: AC
Start: 2018-11-05 — End: ?

## 2018-11-05 MED ORDER — TRUE METRIX METER W/DEVICE KIT
PACK | 0 refills | Status: DC
Start: 2018-11-05 — End: 2018-11-05

## 2018-11-05 MED ORDER — GLUCOSE BLOOD VI STRP
ORAL_STRIP | 11 refills | Status: DC
Start: 2018-11-05 — End: 2018-11-05

## 2018-11-05 NOTE — Progress Notes (Signed)
[name] Key: [id number] - PA Case ID: [id number] - Rx #: [id number] Need help? Call us at [phone]   Status   Sent to [employer]   DrugTrue Metrix Blood Glucose Test strips   [employer] Electronic PA Form [phone] NCPDP)   Original Claim Info70,MR NON-FORMULARY DRUG, CONTACT PRESCRIBER

## 2018-11-05 NOTE — Progress Notes (Signed)
[name] Key: [id number] - PA Case ID: [id number] - Rx #: [id number] Need help? Call us at [phone]   Outcome   Deniedtoday   PA Case: [id number], Status: Denied. Notification: Completed.   DrugTrue Metrix Blood Glucose Test strips   [employer] PA Form [phone] NCPDP)   Original Claim Info70,MR NON-FORMULARY DRUG, CONTACT PRESCRIBER

## 2018-11-05 NOTE — Telephone Encounter (Signed)
True Metrix meter and supplies not covered by insurance. Rx sent for preferred One Touch Ultra Blue.

## 2018-11-05 NOTE — Telephone Encounter (Signed)
Rx for test strips and lancets sent to pharmacy (True Metrix)

## 2018-11-05 NOTE — Progress Notes (Signed)
New Patient Diabetes Consult    Patient Name:  Samuel Koch [[id number]] DOB: [birth year]  Date: 11/05/2018    Subjective:      Patricia Nettle Helena is a 48 y.o. male who is referred by [facility] for evaluation of mild-moderately uncontrolled type 2 diabetes mellitus. Diabetes was diagnosed 2017.  Hgb A1c at time of referral: 8.5% on 04/14/18, previously 8.3% on 04/02/18, 8.2% on 03/26/18.   Hgb A1c goal based on age and comorbidities is <7%.  Complications from diabetes include neuropathy.  He has hyperlipidemia and CHF and followed by the heart failure clinic    This was an in-office visit.      Current Diabetes Medications Include:  -Lantus 20 units BID  -Humalog 8 units TID before meals - hasn't had this for over 2 weeks as he was out and didn't get a refill  -Metformin 1000 QD    Patient is tolerating medications well.  No N/V or diarrhea     Prior Diabetes Medications include Ozempic but too expensive;  Was on insulin and then had stopped when he got his A1c down and recently restarted     Glucose Meter:   He lost his glucose meter and has not checked blood sugars for over 3 weeks.   He recalls being over 200 but not sure if that was fasting or after meals.      Injections:   Injections are given by patient.   Injections sites include: abdominal wall  Injection compliance: He has not taken his Humalog in over 3 weeks.   No problems noted with injection sites.     Diet, Exercise, and Weight:     The patient tries to watch portions, starches and sweets.  He doesn't eat potatoes.  Has a lot of protein and vegetables.  He drinks coffee and water.  He does not drink surgery drinks.    He has rejoined the gym recently.  Prior to COVID, he was walking 5 miles on the treadmill 6 days a week.  He had a flare with his CHF and was not able to tolerate exercise for a few months and is just getting back to it.  He is very concerned about his weight.  At one time he was 350 lbs, got down to 295 lbs and is  now up to 319 lbs.   He did consider weight loss surgery but he knows his insurance does not cover it.      Hypoglycemia:   Symptoms of hypoglycemia include: dizziness and sweating and can feel this when he is in the 160-180.  He knows to eat  The patient does not have a Glucagon Emergency Kit.  The patient does not have a medical alert bracelet or necklace.    Diabetic Education: The patient has never received diabetes education.   Referral was made to [facility] DM program by heart failure clinic but he had not connected with them.          Diabetic Complication Screening/Treatment  Eye exam: Does not recall the eye doctor's name. Last visit was more than a year ago. .  Denies blurry vision or eye problems.     Urine microalbumin: negative;  Renal labs on 04/14/18 demonstrate Creat 1.1, BUN 24, eGFR 78.  Microalbumin/creat ratio was 5 on 04/14/18.     Foot Care: Positive for neuropathy and states he has numbness in he toes.  Does not take any medications.   He care for his feet  with the help of his wife. Denies wounds or ulcers  Monofilament: partially intact Completed on 11/05/18.    Aspirin Therapy: Does not take ASA    Cardiovascular risk factors: diabetes mellitus, dyslipidemia, family history of premature cardiovascular disease, hypertension, male gender and obesity (BMI >= 30 kg/m2);  Negative for smoking.  Negative for alcohol use.   He has CHF, cardiomyopathy and is followed by Dr. Craig Staggers and heart failure clinic.   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 [name].      Statin therapy: The patient is taking statin therapy as indicated in the medication list below.  Lipid panel on 04/14/18 demonstrates Chol 160, Trig 119, HDL 47, LDL 89.  Currently taking atorvastatin 40 mg daily and this is managed by PCP.     Ace Inhibitor/ARB: The patient is taking ACE-I/ARB at dose indicated in medication list and is tolerating this well. Currently taking atorvastatin 40 daily    Dental care: The  patient has regular dental exams every 6 months.      He report no history of thyroid issues.  Does complain of food getting stuck in his throat often.  Denies neck tenderness or compressive symptoms.    Does complain of fatigue, wt gain, constipation    Current Outpatient Medications   Medication Sig Dispense Refill    atorvastatin (LIPITOR) 20 MG tablet Take 10 mg by mouth daily      bumetanide (BUMEX) 1 MG tablet Take 2 tablets (2 mg total) by mouth 2 (two) times daily 120 tablet 3    insulin glargine (LANTUS) 100 UNIT/ML injection Inject 20 Units into the skin every 12 (twelve) hours      Insulin Pen Needle (PEN NEEDLES 3/16") 31G X 5 MM Misc 1 each by Does not apply route 4 (four) times daily 901 each 1    metFORMIN (GLUCOPHAGE) 500 MG tablet TAKE 2 TABLETS BY MOUTH ONCE DAILY IN THE MORNING WITH BREAKFAST 60 tablet 0    metoprolol succinate XL (Toprol XL) 50 MG 24 hr tablet Take 1.5 tablets (75 mg total) by mouth nightly 45 tablet 2    potassium chloride (KLOR-CON) 10 MEQ tablet Take 1 tablet (10 mEq total) by mouth daily dose 30 tablet 11    Blood Glucose Monitoring Suppl (True Metrix Meter) w/Device Kit Check blood sugars 5 times a week 1 each 0    glucose blood test strip Check BS 5 times daily 150 each 11    insulin lispro, 1 Unit Dial, (HUMALOG KWIKPEN) 100 UNIT/ML Solution Pen-injector injection pen Inject 8 Unit into the skin 3 (three) times daily before meals 3 mL 1    Lancets Ultra Fine Misc Check BS 5 times daily 150 each 11     No current facility-administered medications for this visit.       Medication review with Patricia Nettle Morr suggested compliance most of the time.       There is no immunization history on file for this patient.  The following portions of the patient's history were reviewed and updated as appropriate: allergies, current medications, past family history, past medical history, past social history, past surgical history and problem list.  The following information  was also reviewed at today's visit: office notes from referring provider, hospital records and lab data    Review of Systems  Review of Systems   Constitutional: Negative for malaise/fatigue and weight loss.   HENT: Negative for congestion and sore throat.  Eyes: Negative for blurred vision.   Respiratory: Negative for cough and shortness of breath.    Cardiovascular: Negative for chest pain, palpitations and leg swelling.   Gastrointestinal: Negative for abdominal pain, constipation, diarrhea, nausea and vomiting.   Genitourinary: Negative for dysuria, frequency and hematuria.   Musculoskeletal: Negative for back pain, joint pain and myalgias.   Skin: Negative for rash.   Neurological: Negative for tingling, tremors, weakness and headaches.   Endo/Heme/Allergies: Negative for polydipsia.   Psychiatric/Behavioral: Negative for depression. The patient is not nervous/anxious and does not have insomnia.         Objective:      Vital Signs: BP 117/68    Pulse 89    Resp 18    Ht 1.88 m (6\' 2" )    Wt 144.7 kg (319 lb)    BMI 40.96 kg/m   Physical Exam   Constitutional: He is oriented to person, place, and time and well-developed, well-nourished, and in no distress.   HENT:   Head: Normocephalic.   Eyes: Pupils are equal, round, and reactive to light. Conjunctivae are normal.   Neck: Normal range of motion. Neck supple. No thyromegaly present.   Cardiovascular: Normal rate, regular rhythm, S1 normal, S2 normal, normal heart sounds and intact distal pulses.   Pulses:       Radial pulses are 2+ on the right side and 2+ on the left side.        Dorsalis pedis pulses are 2+ on the right side and 2+ on the left side.        Posterior tibial pulses are 2+ on the right side and 2+ on the left side.   Pulmonary/Chest: Effort normal and breath sounds normal.   Musculoskeletal: Normal range of motion.   Lymphadenopathy:     He has no cervical adenopathy.   Neurological: He is alert and oriented to person, place, and time.    Monofilament assessment of bilateral feet completed and sensation is partially intact.   Able to feel 4th and 5th toes but not great toes.       Skin: Skin is warm and dry.   No wounds or ulcers on feet.  Dry skin noted on feet  Toe nails are long  Left great toenail thick    Psychiatric: Mood, memory, affect and judgment normal.   Nursing note and vitals reviewed.       Lab Review    Lab Results   Component Value Date    HGBA1CPERCNT 8.2 03/26/2018    HGBA1CPERCNT 10.7 10/06/2017    CHOL 201 (H) 03/26/2018    HDL 47 03/26/2018    LDL 108 03/26/2018    TRIG 228 (H) 03/26/2018    ALT 19 10/06/2017    GLU 165 (H) 03/27/2018    K 4.1 03/27/2018    CA 8.5 03/27/2018    CREAT 1.10 11/02/2018    CO2 28 03/27/2018    EGFR 78 11/02/2018    NA 136 03/27/2018    ALKPHOS 89 10/06/2017          Impression and Recommendations:    1. Uncontrolled type 2 diabetes mellitus with hyperglycemia  - Hemoglobin A1C; Future  - Ambulatory referral to Ophthalmology  - Ambulatory referral to Podiatry  - Blood Glucose Monitoring Suppl (True Metrix Meter) w/Device Kit; Check blood sugars 5 times a week  Dispense: 1 each; Refill: 0    2. Long term current use of insulin    3. Neuropathy  4. Mixed hyperlipidemia    5. Dysphagia, unspecified type  - TSH; Future    -Do not have any blood sugar readings as he has lost his meter.    -Rx for new meter sent  -Continue Lantus 20 units BID  -Continue Humalog 8 units TID before meals  -Increase Metformin to 1000 BID  -Asked to check blood sugars 4-5 times a day and record and bring log sheet back to office.   Will evaluate and adjust insulin  -Will consider starting a GLP-1;  Was on Ozempic in the past and did well with it but then insurance stopped covering.   -Referral to ophthalmology for dilated eye exam  -Referral to podiatry to evaluate right great toe  -Reviewed signs and symptoms of hypoglycemia and how to treat; Rule of 15  -Reviewed the importance of proper foot care and need to inspect  feet daily  -Does have neuropathy; not on gabapentin  -No renal dysfunction ;  Not on ACEI/ARB   -Does have hyperlipidemia and on a statin and managed by PCP  -Does have HTN and managed by PCP.  BP today was 117/68  -Reviewed diabetic diet, importance of exercise and water intake  -Advised to walk 30 mins 5 days a week and after meals.     Follow up in 1 month or sooner if needed       Counseling:    60 minutes spent with patient face to face, greater than 50% of the office visit was dedicated to counseling and coordination of care, reviewing tests & labs, treatment options and current plan, and follow up plans.    - Discussed with patient importance of always bringing glucometer and log book to all visits  - Discussed about heart healthy diabetic diet and how this impacts diabetes management  - Discussed about importance of exercise and walking 30 mins 5 days a week (150 mins/week)  - Discussed importance of medication and diet compliance  - Keep up to date with annual eye exams and annual podiatry visits  - Advised to call the office if any issues with glucose management such as any hypoglycemic episodes with glucose <70 and/or persistently elevated glucose levels >200 over the next few days  - Discussed signs and symptoms of hypoglycemia and hyperglycemia and patient is aware on how to treat them.     Thank you for referring this patient,   Jimmy Picket, FNP  Electronically Signed 11/05/2018

## 2018-11-05 NOTE — Patient Instructions (Signed)
Increase Metformin to 1000 BID    Check blood sugars 4-5 times a day including some post meal checks and before bed.   Record on log sheet and bring that back in 1 month    Get new glucose meter    Lab in next day or so  - you do not have to be fasting.

## 2018-11-06 LAB — TSH: TSH: 1.3 u[IU]/mL (ref 0.450–4.500)

## 2018-11-06 LAB — HEMOGLOBIN A1C: Hemoglobin A1C: 10.8 % — ABNORMAL HIGH (ref 4.8–5.6)

## 2018-11-10 ENCOUNTER — Other Ambulatory Visit: Payer: Self-pay | Admitting: Family Nurse Practitioner

## 2018-12-03 ENCOUNTER — Ambulatory Visit
Admission: RE | Admit: 2018-12-03 | Discharge: 2018-12-03 | Disposition: A | Discharge status: Home / Self Care | Payer: 59 | Source: Ambulatory Visit | Attending: Family | Admitting: Family

## 2018-12-03 ENCOUNTER — Encounter: Payer: Self-pay | Admitting: Internal Medicine

## 2018-12-03 VITALS — BP 99/80 | HR 74 | Resp 20 | Wt 323.0 lb

## 2018-12-03 DIAGNOSIS — I472 Ventricular tachycardia: Secondary | ICD-10-CM

## 2018-12-03 DIAGNOSIS — I493 Ventricular premature depolarization: Secondary | ICD-10-CM

## 2018-12-03 DIAGNOSIS — Z6839 Body mass index (BMI) 39.0-39.9, adult: Secondary | ICD-10-CM

## 2018-12-03 DIAGNOSIS — E785 Hyperlipidemia, unspecified: Secondary | ICD-10-CM

## 2018-12-03 DIAGNOSIS — R739 Hyperglycemia, unspecified: Secondary | ICD-10-CM

## 2018-12-03 DIAGNOSIS — R0609 Other forms of dyspnea: Secondary | ICD-10-CM

## 2018-12-03 DIAGNOSIS — N289 Disorder of kidney and ureter, unspecified: Secondary | ICD-10-CM

## 2018-12-03 DIAGNOSIS — E1169 Type 2 diabetes mellitus with other specified complication: Secondary | ICD-10-CM

## 2018-12-03 DIAGNOSIS — I4729 Other ventricular tachycardia: Secondary | ICD-10-CM

## 2018-12-03 DIAGNOSIS — I110 Hypertensive heart disease with heart failure: Secondary | ICD-10-CM | POA: Insufficient documentation

## 2018-12-03 DIAGNOSIS — I428 Other cardiomyopathies: Secondary | ICD-10-CM | POA: Insufficient documentation

## 2018-12-03 DIAGNOSIS — I495 Sick sinus syndrome: Secondary | ICD-10-CM

## 2018-12-03 DIAGNOSIS — I5042 Chronic combined systolic (congestive) and diastolic (congestive) heart failure: Secondary | ICD-10-CM | POA: Insufficient documentation

## 2018-12-03 DIAGNOSIS — R0600 Dyspnea, unspecified: Secondary | ICD-10-CM

## 2018-12-03 DIAGNOSIS — I10 Essential (primary) hypertension: Secondary | ICD-10-CM

## 2018-12-03 LAB — I-STAT CHEM 8 CARTRIDGE
Anion Gap I-Stat: 14 (ref 7.0–16.0)
BUN I-Stat: 31 mg/dL — ABNORMAL HIGH (ref 7–22)
Calcium Ionized I-Stat: 5 mg/dL (ref 4.35–5.10)
Chloride I-Stat: 98 mMol/L (ref 98–110)
Creatinine I-Stat: 1.1 mg/dL (ref 0.80–1.30)
EGFR: 78 mL/min/{1.73_m2} (ref 60–150)
Glucose I-Stat: 250 mg/dL — ABNORMAL HIGH (ref 71–99)
Hematocrit I-Stat: 42 % (ref 39.0–52.5)
Hemoglobin I-Stat: 14.3 gm/dL (ref 13.0–17.5)
Potassium I-Stat: 4.4 mMol/L (ref 3.5–5.3)
Sodium I-Stat: 137 mMol/L (ref 136–147)
TCO2 I-Stat: 29 mMol/L (ref 24–29)

## 2018-12-03 MED ORDER — FUROSEMIDE 10 MG/ML IJ SOLN
120.00 mg | Freq: Once | INTRAMUSCULAR | Status: AC
Start: 2018-12-03 — End: 2018-12-03
  Administered 2018-12-03: 09:00:00 120 mg via INTRAVENOUS
  Filled 2018-12-03: qty 12

## 2018-12-03 NOTE — Patient Instructions (Addendum)
Heart failure causes many symptoms.   Some of them are more serious than others.   It is important to recognize when you should call 911 for emergency help and when you should call your doctor or nurse for urgent attention.    The following lists can help you decide what to do when you experience certain symptoms. You may want to print this page and keep it in a convenient spot.    Emergency Symptoms of Heart Failure  Call 911 for emergency help, if you have:    -Chest discomfort or pain that lasts more than 15 minutes that is not relieved with rest or nitroglycerin.  -Severe, persistent shortness of breath.  -Fainted or passed out.    Urgent Symptoms of Heart Failure  Call your doctor immediately, if you have any of the following symptoms:    -Increasing shortness of breath or a new shortness of breath while resting.  -Trouble sleeping due to difficulty breathing. For example, waking up suddenly at night due to difficulty breathing.  -A need to sleep sitting up or on more pillows than usual.  -Fast or irregular heart beats, palpitations, or a racing heart that persists and makes you feel dizzy or lightheaded.    Or if you:  -Cough up frothy or pink sputum.  -Feel like you may faint or pass out.    To manage your heart failure, please follow the following recommendations:    Fluid restriction:  You should follow a daily fluid restriction not to exceed 2 Liters daily (equivalent to 64 fluid ounces). This includes all things liquid at room temperature such as: all beverages, soup, ice cream, popsicles, jello and ice (you will need to guess how much the ice would be when melted and add toward your daily total). Keep track of your fluid intake throughout the day and measure everything.     Daily weights:  Weigh yourself every day when you wake up, right after using the bathroom. Keep a record of your daily weights and call our office if you gain more than 2-3 pounds in 1 day or 5 pounds in less than a week.     Low  sodium diet:  Sodium can lead to fluid retention and patients with heart failure should follow the low sodium diet, do not exceed 2000 mg sodium daily. Do not cook with salt or add it at the table. This includes table salt, sea salt, Kosher salt, garlic salt, seasoned salt or other seasoning's with salt (read the nutrition label). Avoid eating out at restaurants and high sodium foods such as processed meats, pre-packaged meals, pickles, soy sauce, chips, canned soups and bacon. Ask your healthcare provider for more information.    Salt Substitutes   When you eat sodium, your body dilutes it with water. This causes water retention, high blood pressure and a tough job for your heart! Limiting salt will give your heart a well-deserved break but you dont have to sacrifice taste! Salt substitutes enhance the flavor of food without adding sodium. Replace your salt shaker with herbs & spices to support the health of your heart.    Potassium-Salt versus Sodium Salt-Substitutes  Some salt substitutes use Potassium Chloride instead of Sodium Chloride. For some, extra potassium is a bad idea because too much may cause heart rhythm problems. Never use a salt substitute with potassium chloride without first checking with your health care provider.     Contain Potassium:  - Nu-Salt by [employer]   -  NoSalt  - Morton Salt Substitute  - AlsoSalt    No Potassium or Sodium:  - Mrs. Dash (in 10+ flavors)  - [place] No Added Salt (in 4 flavors)  - Emerils No Salt (only 1 flavor: Essence [place])  - Sport and exercise psychologist by [employer] (in 7 flavors)  Herbs & Spices  - lemon or lime juice  - flavored vinegar  - thyme   - basil   - oregano  - sage  - nutmeg   - black pepper  - cayenne pepper  - paprika   - ginger   - cumin  - onion powder  - mace   - celery seed  - garlic powder  - fresh garlic  - curry powder  - mustard seed  - parsley flakes  - cinnamon  - bay leaf     Other Salt-Free Ideas  Season or marinate  meat, poultry, and fish ahead of time with onion, garlic, and your favorite herbs before cooking to bring out the flavor.    Activity:  We encourage you to engage in regular physical activity, such as daily walking. Start with short distances and walk at a comfortable pace. You should be able to "walk and talk". Gradually increase the distance and time that you walk. Exercise will help you feel better and can relieve the symptom of "heavy, tired legs". Stop exercise if you have chest pain, dizziness, or get so short of breath that you can not talk.   You should not lift/push/or pull heavy objects (anything heavy enough to make you strain).     Medications:  Take all your medications as prescribed by your health care provider and bring all your medication bottles to every appointment. It is important that you know what you are taking, when, and for what reason.    Medications you should not take:  Nonsteroidal anti-inflammatory drugs (NSAIDS): such as ibuprofen, Advil, Motrin, naprosyn, Naproxen, may cause fluid retention. You should not take these medications.    Call your healthcare provider if you are not feeling well or have new problems. We will call you back within 24 hours (Monday-Friday). In case of an emergency, call 911 or go to the nearest emergency department.      Thank you for choosing the Advanced Heart Failure Center at [facility] with [facility] for your care. It is important to keep all your medical appointments, even if you feel well. To reschedule or cancel an appointment with our clinic, call [phone]. If you need to leave a message on the nurse line for the heart failure clinic, please call [phone].        We will give IV Lasix 120 mg push in the clinic.  Continue taking Bumex 2 mg twice a day at home.  If experiencing more dizziness or low blood pressures, can cut back to 1 mg twice a day and give [place] a call.  It appears your weight gain may be more due to diet and  true weight gain rather than water weight.  Follow-up in about 6 weeks.

## 2018-12-03 NOTE — Addendum Note (Signed)
Encounter addended by: Malena Peer, RN on: 12/03/2018 9:29 AM   Actions taken: MAR administration accepted

## 2018-12-03 NOTE — Progress Notes (Signed)
[facility]    I had the pleasure of seeing Samuel Koch today at [facility] for an advanced heart failure follow up.  Follows with Dr. [name] for cardiology.    Samuel Koch is a 48 y.o. male with nonischemic cardiomyopathy, chronic combined systolic and diastolic heart failure, latest EF 50-55%, NYHA Class 3, ACC/AHA Stage C.  Has dual-chamber pacemaker in place.  Status post PVC ablation.  Also uncontrolled type II diabetic, obesity.    His LV function has improved significantly.  He is no longer taking MRA or afterload reduction therapy, does remain on beta-blocker.  At last visit still complaining of some fatigue, not exercising much but was encouraged to walk more.  He is on Bumex 2 mg twice a day.    He also had mentioned that he would be traveling for much of October.  He went to [place] in a couple of days states he noticed that he did not have the best diet.  He has gained weight about 4 pounds, currently current 23 pounds.  Acknowledges abdominal bloating, but no swelling in the legs.  Occasionally having some dizziness, he does not appear to be correlating it with low blood pressures or low sugars.  Glucose today is elevated at 250.  Borderline low blood pressure 99/80.    Has follow-up with Endo on 11/05/2018, recommended that time to continue with Lantus and Humalog and Metformin.  Has not seen nephrology.  Last device check on 10/08/2018 which showed an intermittent duration SVT events.        Review of Systems       Comprehensive review of systems performed by me. Unless otherwise noted, all systems are negative.      Most recent laboratory testing reveals:   LABORATORY DATA:  Lab Results   Component Value Date    BUN 20 03/27/2018    NA 136 03/27/2018    K 4.1 03/27/2018    CL 103 03/27/2018    CO2 28 03/27/2018     Lab Results   Component Value Date    WBC 6.3 03/27/2018    HGB 13.7 03/27/2018    HCT 41.0 03/27/2018    MCV 92  03/27/2018    PLT 210 03/27/2018       Medical Problems:  Patient Active Problem List    Diagnosis Date Noted    Adjustment and management of cardiac pacemaker 07/08/2018    Class 2 severe obesity with serious comorbidity and body mass index (BMI) of 39.0 to 39.9 in adult, unspecified obesity type 07/08/2018    SSS (sick sinus syndrome) 07/08/2018    PVC (premature ventricular contraction) 07/08/2018    Chest pain 03/26/2018    NICM (nonischemic cardiomyopathy) 10/20/2017    Chronic combined systolic and diastolic HF (heart failure) 10/20/2017    Obesity, morbid, BMI 40.0-49.9 10/07/2017    DOE (dyspnea on exertion) 10/06/2017    Hyperglycemia 03/29/2017    Essential hypertension 03/29/2017    Renal insufficiency 03/29/2017    NSVT (nonsustained ventricular tachycardia) 08/17/2015    DM type 2 with diabetic dyslipidemia 07/31/2015       History from Cardiology at [facility] [place]  1. Chronic combined systolic and diastolic CHF  2. Nonischemic cardiomyopathy  A. LHC (6/17): 30% mid RCA, no other significant disease.   B. Echo (6/17): EF 30-35%.   C. Holter (7/17): 19% PVCs.   D. Echo (3/18): EF 50-55%, moderate LVH, grade II  diastolic dysfunction.   E. Echo (9/18): EF 50-55%  F. Echo 10/07/17- LV cavity size is increased. The LVEF is est. at 50%. Mild hypokinesis of the distal septum and apex which may be due to paced rhythm. Pacer wire present in RV.   G. Echo 06/12/2018- Mild concentric LVH, EF 50-55%, aortic root is mildy dilated, aortic root is 3.9cm, tricuspid regurgitation is mild, RSVP .     3. Type II diabetes  4. Hypertriglyceridemia  5. PVCs  A. 48 hour holter (7/17) with 19% PVCs.  B. PVC ablation in 8/17 but PVCs recurred. Will   C. Holter (11/17) with rare PVCs, < 1%.   D. Holter (9/18) with rare PVCs only  6. Bradycardia/ SSS  A. Dual chamber pacemaker placed ([employer]tronic, MRI compatible).     7. NSVT  8. Acute Coronary Syndrome   A. Echo 03/27/2018- EF 50%, no regional wall motion  abnormalities present, grade 2 DD.  no significant valvular heart disease.   B. Stress 03/28/2018- Completed vasodilator protocol., No ischemic changes with infusion, No arrhythmias,low risk. Perfusion defect noted thought to be diaphragmatic attenuation  Dilated LV with mildly decreased left ventricular function, stress EF 44%.   C. THC 03/30/2018- Non obstructive cad.  Echo output 5 cardiac index 2 wedge 14 mean PA 22    Past Medical History:   Diagnosis Date    Cardiomyopathy     Congestive heart failure     Diabetes mellitus     Hypertension     PVC's (premature ventricular contractions)      Past Surgical History:   Procedure Laterality Date    APPENDECTOMY      CARDIAC PACEMAKER PLACEMENT      RIGHT & LEFT HEART CATH  W/ CORONARY ANGIOS, LV/LA Right 03/30/2018    Procedure: RIGHT & LEFT HEART CATH  W/ CORONARY ANGIOS, LV/LA;  Surgeon: Santo Held, MD;  Location: [facility] CATH/EP;  Service: Cardiovascular;  Laterality: Right;     Family History   Problem Relation Age of Onset    Heart disease Father     Hypertension Father     Heart disease Sister     Heart attack Sister     Heart disease Brother     Diabetes Brother     Diabetes Paternal Grandmother      Social History     Socioeconomic History    Marital status: Married     Spouse name: Not on file    Number of children: Not on file    Years of education: Not on file    Highest education level: Not on file   Occupational History    Not on file   Social Needs    Financial resource strain: Not on file    Food insecurity     Worry: Not on file     Inability: Not on file    Transportation needs     Medical: Not on file     Non-medical: Not on file   Tobacco Use    Smoking status: Former Smoker    Smokeless tobacco: Former Estate agent and Sexual Activity    Alcohol use: No    Drug use: No    Sexual activity: Not on file     Comment: Deferred   Lifestyle    Physical activity     Days per week: Not on file     Minutes per session: Not on  file    Stress: Not  on file   Relationships    Social connections     Talks on phone: Not on file     Gets together: Not on file     Attends religious service: Not on file     Active member of club or organization: Not on file     Attends meetings of clubs or organizations: Not on file     Relationship status: Not on file    Intimate partner violence     Fear of current or ex partner: Not on file     Emotionally abused: Not on file     Physically abused: Not on file     Forced sexual activity: Not on file   Other Topics Concern    Not on file   Social History Narrative    Not on file     Married   Non smoker - isolated smoking before age 81  Isolated drinking before age 82 - 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      Current Outpatient Medications   Medication Sig Dispense Refill    atorvastatin (LIPITOR) 20 MG tablet Take 10 mg by mouth daily      Blood Glucose Monitor, generic, Kit Use as directed to monitor blood sugar 1 kit 1    bumetanide (BUMEX) 2 MG tablet Take 2 mg by mouth 2 (two) times daily      glucose blood test strip Check BS 5 times daily 150 each 11    insulin glargine (LANTUS) 100 UNIT/ML injection Inject 20 Units into the skin every 12 (twelve) hours      insulin lispro, 1 Unit Dial, (HUMALOG KWIKPEN) 100 UNIT/ML Solution Pen-injector injection pen Inject 8 Unit into the skin 3 (three) times daily before meals 3 mL 1    Insulin Pen Needle (PEN NEEDLES 3/16") 31G X 5 MM Misc 1 each by Does not apply route 4 (four) times daily 901 each 1    Lancets Ultra Fine Misc Check BS 5 times daily 150 each 11    metFORMIN (GLUCOPHAGE) 500 MG tablet TAKE 2 TABLETS BY MOUTH ONCE DAILY IN THE MORNING WITH BREAKFAST 60 tablet 0    metoprolol succinate XL (Toprol XL) 50 MG 24 hr tablet Take 1.5 tablets (75 mg total) by mouth nightly 45 tablet 2    potassium chloride (KLOR-CON) 10 MEQ tablet Take 1 tablet (10 mEq total) by mouth daily dose 30 tablet 11      No current facility-administered medications for this encounter.      No Known Allergies    Vitals:    Vitals:    12/03/18 0843   BP: 99/80   Pulse: 74   Resp: 20   SpO2: 99%     Weight is down 6 lbs from last office visit.     Physical Exam:   General: overweight male, alert, NAD  HEENT:  Anicteric sclera, supple, no significant JVD.  Lungs: Bibasilar decreased air entry, no crackles or rails.   Cardiovascular: s1s2, normal rate, regular rhythm, no m/r/g  Abdominal: obese/distended, nontender  Extremities: warm to touch, no edema  Neuromuscular exam: grossly non-focal, though not formally tested.    Assessment and Plan:  1. Hyperglycemia     2. Essential hypertension     3. Renal insufficiency     4. DOE (dyspnea on exertion)     5. Obesity, morbid, BMI 40.0-49.9     6. NICM (nonischemic cardiomyopathy)  7. Chronic combined systolic and diastolic HF (heart failure)     8. Class 2 severe obesity with serious comorbidity and body mass index (BMI) of 39.0 to 39.9 in adult, unspecified obesity type     9. SSS (sick sinus syndrome)     10. NSVT (nonsustained ventricular tachycardia)     11. DM type 2 with diabetic dyslipidemia     12. PVC (premature ventricular contraction)         Cardiomyopathy:   My impression is that, Samuel Koch has nonischemic cardiomyopathy, chronic systolic/diastolic heart failure ACC/AHA stage C, class III with improved EF.  Remains on beta-blocker, but no longer on any other heart failure medical therapy.    He is having episodes of some lightheadedness/dizziness, blood pressure 98/80.  However with his history of SVT, heart rate 74, will continue with Toprol-XL.  He is actually fairly well compensated, perhaps a couple pounds of volume overload given some abdominal distention.  However the recent weight gain and overall weight gain over the last couple of months may be more likely secondary to diet and increase in true weight rather than fluid weight.    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  Is been encouraged to rectify this and to try to increase his exercise and set a goal to come down to 300 pounds over the next 3 to 4 months      In regards to OMM:  Beta-blocker: mtoprol XL  ACE-I/ARB/ARNI: no, patient stopped taking  Aldosterone antagonist: no, patient stopped taking  Hydralazine/nitrate: no   Digoxin: no    Medication changes:  We will give IV Lasix push 120 mg here in the clinic.  Meanwhile, he should continue Bumex 2 mg twice a day at home, cut back to 1 mg twice a day if he is feeling lightheadedness still and give [place] a call.  If additional diabetic therapy will be added by endocrinology, do recommend considering SGLT2 inhibitor given the benefit in heart failure patients with with and without diabetes.      6-week follow-up.      Electronically signed by:  Faythe Ghee, MD  [facility]  [address], [address]  [place], [place] [address]  [phone]    [facility] - [facility]  [address]  [place], [place] [address]  [phone]

## 2018-12-09 ENCOUNTER — Telehealth (INDEPENDENT_AMBULATORY_CARE_PROVIDER_SITE_OTHER): Payer: Self-pay

## 2018-12-09 ENCOUNTER — Ambulatory Visit: Payer: 59 | Attending: Registered Nurse | Admitting: Registered Nurse

## 2018-12-09 ENCOUNTER — Encounter (INDEPENDENT_AMBULATORY_CARE_PROVIDER_SITE_OTHER): Payer: Self-pay | Admitting: Registered Nurse

## 2018-12-09 VITALS — BP 112/72 | HR 67 | Temp 98.0°F | Resp 14 | Ht 74.02 in | Wt 321.3 lb

## 2018-12-09 DIAGNOSIS — Z794 Long term (current) use of insulin: Secondary | ICD-10-CM

## 2018-12-09 DIAGNOSIS — E1169 Type 2 diabetes mellitus with other specified complication: Secondary | ICD-10-CM

## 2018-12-09 DIAGNOSIS — E782 Mixed hyperlipidemia: Secondary | ICD-10-CM

## 2018-12-09 DIAGNOSIS — G629 Polyneuropathy, unspecified: Secondary | ICD-10-CM

## 2018-12-09 DIAGNOSIS — E1165 Type 2 diabetes mellitus with hyperglycemia: Secondary | ICD-10-CM

## 2018-12-09 DIAGNOSIS — N521 Erectile dysfunction due to diseases classified elsewhere: Secondary | ICD-10-CM

## 2018-12-09 MED ORDER — INSULIN GLARGINE 100 UNIT/ML SC SOLN
22.00 [IU] | Freq: Two times a day (BID) | SUBCUTANEOUS | 3 refills | Status: DC
Start: 2018-12-09 — End: 2019-03-11

## 2018-12-09 MED ORDER — OZEMPIC (0.25 OR 0.5 MG/DOSE) 2 MG/1.5ML SC SOPN
1.00 mg | PEN_INJECTOR | SUBCUTANEOUS | 3 refills | Status: DC
Start: 2018-12-09 — End: 2019-03-11

## 2018-12-09 MED ORDER — FREESTYLE LIBRE 2 SENSOR SYSTM MISC
1.00 | 12 refills | Status: DC
Start: 2018-12-09 — End: 2021-09-13

## 2018-12-09 MED ORDER — INSULIN LISPRO (1 UNIT DIAL) 100 UNIT/ML SC SOPN
8.00 [IU] | PEN_INJECTOR | Freq: Three times a day (TID) | SUBCUTANEOUS | 3 refills | Status: DC
Start: 2018-12-09 — End: 2019-03-11

## 2018-12-09 NOTE — Progress Notes (Signed)
Endocrinology Follow Up Note    Patient Name:  Samuel Koch [[id number]] DOB: 12-Sep-1970  Date: 12/09/2018    Subjective:      [name] is a 48 y.o. male here for follow up of severally  uncontrolled type 2 diabetes mellitus. Diabetes was diagnosed 2017.  Hgb A1c was 10.8% on 11/08/18, previously 8.5% on 04/14/18, 8.3% on 04/02/18, 8.2% on 03/26/18.   Hgb A1c goal based on age and comorbidities is <7%.  Complications from diabetes include neuropathy and erectile dysfunction.  He has hyperlipidemia and CHF and followed by the heart failure clinic    This was an in-office visit.  His wife was on the phone.      Current Diabetes Medications Include:  -Lantus 20 units BID  -Humalog 8 units TID before meals   -Metformin 1000 BID    Patient is tolerating medications well.  No N/V or diarrhea.  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  States he lost 7 lbs.       Prior Diabetes Medications include Ozempic but too expensive;  Was on insulin and then had stopped when he got his A1c down and recently restarted     Glucose Meter:   He has brought his blood sugar log today and he checks his blood sugars once a day in the morning.     Fasting range 175-321.   This morning he was 321.      Injections:   Injections are given by patient.   Injections sites include: abdominal wall  Injection compliance: He does not like that the insulin causes weight gain so he stopped taking it for 2 days.    No problems noted with injection sites.     Diet, Exercise, and Weight:     The patient tries to watch portions, starches and sweets.  He doesn't eat potatoes.  Has a lot of protein and vegetables.  He drinks coffee and water.  He does not drink surgery drinks.    He has rejoined the gym recently.  Prior to COVID, he was walking 5 miles on the treadmill 6 days a week.  He had a flare with his CHF and was not able to tolerate exercise for a few  months and is just getting back to it.  He is very concerned about his weight.  At one time he was 350 lbs, got down to 295 lbs and is now up to 321 lbs.   He did consider weight loss surgery but he knows his insurance does not cover it as he has already checked.      Hypoglycemia:   Symptoms of hypoglycemia include: dizziness and sweating and can feel this when he is in the 160-180.  He knows to eat  The patient does not have a Glucagon Emergency Kit.  The patient does not have a medical alert bracelet or necklace.    Diabetic Education: The patient has never received diabetes education.   Referral was made to [facility] DM program by heart failure clinic but he had not connected with them.          Diabetic Complication Screening/Treatment  Eye exam: Does not recall the eye doctor's name. Last visit was more than a year ago. .  Denies blurry vision or eye problems.  A referral was made at last visit in early October but he  has not connected with them.      Urine microalbumin: negative;  Renal labs on 12/03/18 demonstrate Creat 1.10, BUN 31, eGFR 78.  Microalbumin/creat ratio was 5 on 04/14/18.     Foot Care: Positive for neuropathy and states he has numbness in he toes.  Does not take any medications.   He care for his feet with the help of his wife. Denies wounds or ulcers  Monofilament: partially intact Completed on 11/05/18.    Aspirin Therapy: Does not take ASA    Cardiovascular risk factors: diabetes mellitus, dyslipidemia, family history of premature cardiovascular disease, hypertension, male gender and obesity (BMI >= 30 kg/m2);  Negative for smoking.  Negative for alcohol use.   He has CHF, cardiomyopathy and is followed by Dr. Craig Staggers and heart failure clinic.   He has a pacemaker that was implanted for SSS and is followed by Dr. [name].      Statin therapy: The patient is taking statin therapy as indicated in the medication list below.  Lipid panel on 04/14/18 demonstrates Chol 160, Trig 119, HDL 47, LDL 89.   Currently taking atorvastatin 40 mg daily and this is managed by PCP.     Ace Inhibitor/ARB: The patient is taking ACE-I/ARB at dose indicated in medication list and is tolerating this well. Currently taking atorvastatin 40 daily    Dental care: The patient has regular dental exams every 6 months.      He report no history of thyroid issues.  Did complain of food getting stuck in his throat but this has resolved.  Denies neck tenderness or compressive symptoms.    Does complain of fatigue, wt gain, constipation.   TSH on 11/08/18 was 1.3.    He is having erectile dysfunction that started approximately 4 months ago which is very concerning to him.  He would like to be referred to review options.     Current Outpatient Medications   Medication Sig Dispense Refill    atorvastatin (LIPITOR) 20 MG tablet Take 10 mg by mouth daily      Blood Glucose Monitor, generic, Kit Use as directed to monitor blood sugar 1 kit 1    Blood Glucose Monitoring Suppl (OneTouch Ultra 2) w/Device Kit USE AS DIRECTED TO MONITOR BLOOD SUGAR      bumetanide (BUMEX) 2 MG tablet Take 2 mg by mouth 2 (two) times daily      glucose blood test strip Check BS 5 times daily 150 each 11    insulin glargine (LANTUS) 100 UNIT/ML injection Inject 22 Units into the skin every 12 (twelve) hours Pt states that he has not been compliant in the past few days with taking his insulin secondary to weight gain, but did take it this morning. 15 pen 3    insulin lispro, 1 Unit Dial, (HumaLOG KwikPen) 100 UNIT/ML Solution Pen-injector injection pen Inject 8 Units into the skin 3 (three) times daily before meals 9 pen 3    Insulin Pen Needle (PEN NEEDLES 3/16") 31G X 5 MM Misc 1 each by Does not apply route 4 (four) times daily 901 each 1    Lancets Ultra Fine Misc Check BS 5 times daily 150 each 11    lisinopril (ZESTRIL) 10 MG tablet Take 10 mg by mouth daily      metFORMIN (GLUCOPHAGE) 500 MG tablet TAKE 2 TABLETS BY MOUTH ONCE DAILY IN THE MORNING WITH  BREAKFAST (Patient taking differently: Taking 2 tablets in the morning and 1 in the evening.  )  60 tablet 0    metoprolol succinate XL (Toprol XL) 50 MG 24 hr tablet Take 1.5 tablets (75 mg total) by mouth nightly 45 tablet 2    potassium chloride (KLOR-CON) 10 MEQ tablet Take 1 tablet (10 mEq total) by mouth daily dose 30 tablet 11    spironolactone (ALDACTONE) 25 MG tablet Take 25 mg by mouth daily      Continuous Blood Gluc Sensor (FreeStyle Libre 2 Sensor Systm) Misc 1 Device by Does not apply route every 14 (fourteen) days 2 each 12    Semaglutide,0.25 or 0.5MG /DOS, (Ozempic, 0.25 or 0.5 MG/DOSE,) 2 MG/1.5ML Solution Pen-injector Inject 1 mg into the skin once a week 0.25mg  once a week for 4 weeks,0.5mg  once a week for 4 weeks, then 1 mg once a week 1 pen 3     No current facility-administered medications for this visit.       Medication review with Patricia [place] suggested compliance most of the time.       There is no immunization history on file for this patient.  The following portions of the patient's history were reviewed and updated as appropriate: allergies, current medications, past family history, past medical history, past social history, past surgical history and problem list.  The following information was also reviewed at today's visit: office notes from referring provider, hospital records and lab data    Review of Systems  Review of Systems   Constitutional: Negative for malaise/fatigue and weight loss.   HENT: Negative for congestion and sore throat.    Eyes: Negative for blurred vision.   Respiratory: Negative for cough and shortness of breath.    Cardiovascular: Negative for chest pain, palpitations and leg swelling.   Gastrointestinal: Negative for abdominal pain, constipation, diarrhea, nausea and vomiting.   Genitourinary: Negative for dysuria, frequency and hematuria.   Musculoskeletal: Negative for back pain, joint pain and myalgias.   Skin: Negative for rash.   Neurological:  Positive for tingling (feet). Negative for tremors, weakness and headaches.   Endo/Heme/Allergies: Negative for polydipsia.   Psychiatric/Behavioral: Negative for depression. The patient is not nervous/anxious and does not have insomnia.         Objective:      Vital Signs: BP 112/72    Pulse 67    Temp 98 F (36.7 C) (Oral)    Resp 14    Ht 1.88 m (6' 2.02")    Wt 145.8 kg (321 lb 5.1 oz)    BMI 41.24 kg/m   Physical Exam   Constitutional: He is oriented to person, place, and time and well-developed, well-nourished, and in no distress. No distress.   HENT:   Head: Normocephalic.   Eyes: Pupils are equal, round, and reactive to light. Conjunctivae are normal.   Neck: Normal range of motion. Neck supple. No thyromegaly present.   Cardiovascular: Normal rate, regular rhythm, S1 normal, S2 normal, normal heart sounds and intact distal pulses.   Pulses:       Radial pulses are 2+ on the right side and 2+ on the left side.   Pulmonary/Chest: Effort normal and breath sounds normal.   Musculoskeletal: Normal range of motion.   Lymphadenopathy:     He has no cervical adenopathy.   Neurological: He is alert and oriented to person, place, and time.        Skin: Skin is warm and dry.   Psychiatric: Mood, memory, affect and judgment normal.   Nursing note and vitals reviewed.  Lab Review    Lab Results   Component Value Date    HGBA1CPERCNT 8.2 03/26/2018    HGBA1CPERCNT 10.7 10/06/2017    HGBA1C 10.8 (H) 11/05/2018    TSH 1.300 11/05/2018    CHOL 201 (H) 03/26/2018    HDL 47 03/26/2018    LDL 108 03/26/2018    TRIG 228 (H) 03/26/2018    ALT 19 10/06/2017    GLU 165 (H) 03/27/2018    K 4.1 03/27/2018    CA 8.5 03/27/2018    CREAT 1.10 12/03/2018    CO2 28 03/27/2018    EGFR 78 12/03/2018    NA 136 03/27/2018    ALKPHOS 89 10/06/2017          Impression and Recommendations:    1. Uncontrolled type 2 diabetes mellitus with hyperglycemia  - Ambulatory referral to Diabetic Education  - Semaglutide,0.25 or 0.5MG /DOS,  (Ozempic, 0.25 or 0.5 MG/DOSE,) 2 MG/1.5ML Solution Pen-injector; Inject 1 mg into the skin once a week 0.25mg  once a week for 4 weeks,0.5mg  once a week for 4 weeks, then 1 mg once a week  Dispense: 1 pen; Refill: 3  - Continuous Blood Gluc Sensor (FreeStyle Libre 2 Sensor Systm) Misc; 1 Device by Does not apply route every 14 (fourteen) days  Dispense: 2 each; Refill: 12  - Hemoglobin A1C; Future  - Ambulatory referral to Urology    2. Long term current use of insulin    3. Neuropathy    4. Erectile dysfunction associated with type 2 diabetes mellitus    5. Mixed hyperlipidemia    -Increase Lantus to 22 units BID  -Continue Humalog 8 units TID before meals  -Continue Metformin to 1000 BID  -Start Ozempic weekly injections.  Start at 0.25 mg x 4 weeks, then 0.5 mg  X 4 weeks, then 1 mg weekly.    -Placed a Free Style Libre on his left arm with instructions on how to use.   -Asked to check blood sugars 4-5 times a day and record and bring log sheet and send to the office in 2 weeks.  Will evaluate and adjust insulin  -Will consider starting a GLP-1;  Was on Ozempic in the past and did well with it but then insurance stopped covering.   -Referral to [facility] DM program for the in person class for he and his wife.   -Referral to ophthalmology for dilated eye exam was made previously  -Referral to podiatry to evaluate right great toe was made previously  -Reviewed signs and symptoms of hypoglycemia and how to treat; Rule of 15  -Reviewed the importance of proper foot care and need to inspect feet daily  -Does have neuropathy; not on gabapentin  -No renal dysfunction ;  Not on ACEI/ARB   -Does have hyperlipidemia and on a statin and managed by PCP  -Does have HTN and managed by PCP.  BP today was 112/72  -Reviewed diabetic diet, importance of exercise and water intake  -Advised to walk 30 mins 5 days a week and after meals.     Follow up in 3 month or sooner if needed     Counseling:    40 minutes spent with patient face to  face, greater than 50% of the office visit was dedicated to counseling and coordination of care, reviewing tests & labs, treatment options and current plan, and follow up plans.    - Discussed with patient importance of always bringing glucometer and log book to all visits  -  Discussed about heart healthy diabetic diet and how this impacts diabetes management  - Discussed about importance of exercise and walking 30 mins 5 days a week (150 mins/week)  - Discussed importance of medication and diet compliance  - Keep up to date with annual eye exams and annual podiatry visits  - Advised to call the office if any issues with glucose management such as any hypoglycemic episodes with glucose <70 and/or persistently elevated glucose levels >200 over the next few days  - Discussed signs and symptoms of hypoglycemia and hyperglycemia and patient is aware on how to treat them.     Jimmy Picket, FNP  Electronically Signed 12/09/2018

## 2018-12-09 NOTE — Telephone Encounter (Signed)
Lot number was found without calling the patient.

## 2018-12-09 NOTE — Telephone Encounter (Signed)
-----   Message from Jimmy Picket, FNP sent at 12/09/2018 12:50 PM EST -----  Please call him and ask him the Lot Number on the [facility] box.  Its on the side.   Thanks, Thurston Hole

## 2018-12-09 NOTE — Patient Instructions (Addendum)
Increase Lantus to 22 units twice a day    Check blood sugar 4 times a day and record and send to the office in 2 weeks.   Will review and call you with results    Start Ozempic injection weekly.   Start with just 0.25 mg for 4 weeks, then increase to 0.5 mg for 4 weeks, then up to 1 mg weekly.     Referral to diabetes program for the class.

## 2018-12-10 ENCOUNTER — Encounter (INDEPENDENT_AMBULATORY_CARE_PROVIDER_SITE_OTHER): Payer: Self-pay

## 2018-12-10 NOTE — Progress Notes (Signed)
BS log scanned to chart

## 2018-12-14 ENCOUNTER — Telehealth (INDEPENDENT_AMBULATORY_CARE_PROVIDER_SITE_OTHER): Payer: Self-pay

## 2018-12-14 NOTE — Telephone Encounter (Signed)
Called the patient and left a message giving him Dr. Dayna Ramus number.

## 2018-12-14 NOTE — Telephone Encounter (Signed)
Called podiatry and left a message concerning the patient's referral.

## 2018-12-14 NOTE — Telephone Encounter (Signed)
Called Dr. Evlyn Courier office regarding Dr. Gae Gallop referral. They stated that they had called him multiple times without being able to get in touch with him. I will give him a call this morning and give him Dr. Evlyn Courier and Dr. Neita Garnet number. I will also give Dr. Dayna Ramus office a call to see what that status of his referral is.

## 2018-12-17 ENCOUNTER — Ambulatory Visit: Payer: Self-pay | Admitting: Internal Medicine

## 2018-12-21 ENCOUNTER — Other Ambulatory Visit: Payer: Self-pay | Admitting: Internal Medicine

## 2018-12-26 ENCOUNTER — Other Ambulatory Visit: Payer: Self-pay | Admitting: Family Nurse Practitioner

## 2018-12-26 ENCOUNTER — Emergency Department
Admission: EM | Admit: 2018-12-26 | Discharge: 2018-12-26 | Disposition: A | Discharge status: Home / Self Care | Payer: 59 | Attending: Emergency Medicine | Admitting: Emergency Medicine

## 2018-12-26 ENCOUNTER — Emergency Department: Payer: 59

## 2018-12-26 DIAGNOSIS — R079 Chest pain, unspecified: Secondary | ICD-10-CM

## 2018-12-26 DIAGNOSIS — I10 Essential (primary) hypertension: Secondary | ICD-10-CM | POA: Insufficient documentation

## 2018-12-26 DIAGNOSIS — E119 Type 2 diabetes mellitus without complications: Secondary | ICD-10-CM | POA: Insufficient documentation

## 2018-12-26 LAB — COMPREHENSIVE METABOLIC PANEL
ALT: 24 U/L (ref 0–55)
AST (SGOT): 15 U/L (ref 10–42)
Albumin/Globulin Ratio: 1.45 Ratio (ref 0.80–2.00)
Albumin: 4.2 gm/dL (ref 3.5–5.0)
Alkaline Phosphatase: 67 U/L (ref 40–145)
Anion Gap: 11.1 mMol/L (ref 7.0–18.0)
BUN / Creatinine Ratio: 19.9 Ratio (ref 10.0–30.0)
BUN: 27 mg/dL — ABNORMAL HIGH (ref 7–22)
Bilirubin, Total: 0.3 mg/dL (ref 0.1–1.2)
CO2: 28 mMol/L (ref 20–30)
Calcium: 9.6 mg/dL (ref 8.5–10.5)
Chloride: 101 mMol/L (ref 98–110)
Creatinine: 1.36 mg/dL — ABNORMAL HIGH (ref 0.80–1.30)
EGFR: 60 mL/min/{1.73_m2} (ref 60–150)
Globulin: 2.9 gm/dL (ref 2.0–4.0)
Glucose: 231 mg/dL — ABNORMAL HIGH (ref 71–99)
Osmolality Calculated: 284 mOsm/kg (ref 275–300)
Potassium: 4.1 mMol/L (ref 3.5–5.3)
Protein, Total: 7.1 gm/dL (ref 6.0–8.3)
Sodium: 136 mMol/L (ref 136–147)

## 2018-12-26 LAB — CBC AND DIFFERENTIAL
Basophils %: 0.4 % (ref 0.0–3.0)
Basophils Absolute: 0 10*3/uL (ref 0.0–0.3)
Eosinophils %: 1.5 % (ref 0.0–7.0)
Eosinophils Absolute: 0.2 10*3/uL (ref 0.0–0.8)
Hematocrit: 39.3 % (ref 39.0–52.5)
Hemoglobin: 13.2 gm/dL (ref 13.0–17.5)
Lymphocytes Absolute: 3.2 10*3/uL (ref 0.6–5.1)
Lymphocytes: 27.3 % (ref 15.0–46.0)
MCH: 31 pg (ref 28–35)
MCHC: 34 gm/dL (ref 32–36)
MCV: 92 fL (ref 80–100)
MPV: 8 fL (ref 6.0–10.0)
Monocytes Absolute: 1 10*3/uL (ref 0.1–1.7)
Monocytes: 8.5 % (ref 3.0–15.0)
Neutrophils %: 62.3 % (ref 42.0–78.0)
Neutrophils Absolute: 7.3 10*3/uL (ref 1.7–8.6)
PLT CT: 281 10*3/uL (ref 130–440)
RBC: 4.29 10*6/uL (ref 4.00–5.70)
RDW: 11.6 % (ref 11.0–14.0)
WBC: 11.8 10*3/uL — ABNORMAL HIGH (ref 4.0–11.0)

## 2018-12-26 LAB — TROPONIN I: Troponin I: 0.01 ng/mL (ref 0.00–0.02)

## 2018-12-26 NOTE — Discharge Instructions (Signed)
Uncertain Causes of Chest Pain    Chest pain can happen for a number of reasons. Sometimes the cause can't be determined. If your condition does not seem serious, and your pain does not appear to be coming from your heart, your healthcare provider may recommend watching it closely. Sometimes the signs of a serious problem take more time to appear. Many problems not related to your heart can cause chest pain. These include:  · Musculoskeletal. Costochondritis is an inflammation of the tissues around the ribs that can occur from trauma or overuse injuries, or a strain of the muscles of the chest wall  · Respiratory. Pneumonia, collapsed lung (pneumothorax), or inflammation of the lining of the chest and lungs (pleurisy)  · Gastrointestinal. Esophageal reflux, heartburn, ulcers, or gallbladder disease  · Anxiety and panic disorders  · Nerve compression and inflammation  · Rare miscellaneous problems such as aortic aneurysm (a swelling of the large artery coming out of the heart) or pulmonary embolism (a blood clot in the lungs)  Home care  After your visit, follow these recommendations:  · Rest today and avoid strenuous activity.  · Take any prescribed medicine as directed.  · Be aware of any recurrent chest pain and notice any changes  Follow-up care  Follow up with your healthcare provider if you do not start to feel better within 24 hours, or as advised.  Call 911  Call 911 if any of these occur:  · A change in the type of pain: if it feels different, becomes more severe, lasts longer, or begins to spread into your shoulder, arm, neck, jaw or back  · Shortness of breath or increased pain with breathing  · Weakness, dizziness, or fainting  · Rapid heart beat  · Crushing sensation in your chest  When to seek medical advice  Call your healthcare provider right away if any of the following occur:  · Cough with dark colored sputum (phlegm) or blood  · Fever of 100.4ºF (38ºC) or higher, or as directed by your healthcare  provider  · Swelling, pain or redness in one leg  [employer] last reviewed this educational content on 06/04/2016  © 2000-2020 [employer] [address], [place] [address]. All rights reserved. This information is not intended as a substitute for professional medical care. Always follow your healthcare professional's instructions.

## 2018-12-26 NOTE — ED Provider Notes (Signed)
EMERGENCY DEPARTMENT HISTORY AND PHYSICAL EXAM      Date Time: 12/26/18 8:00 PM  Patient Name: Samuel Koch  Attending Physician: Marvene Staff, MD    Assessment/Plan:     Chest pain  No acute findings on CXR, exam, labs  Offered CT scan of chest but patient wanted to go home    MDM     The patient presented with chest pain and is clinically well appearing. Symptoms are not suggestive of pulmonary embolus, cardiac ischemia, aortic dissection, or other serious etiology. He has a history of cardiomyopathy but does not have coronary disease. I offered a CT scan but he did not want one done. I suggested he follow up with his PCP as well as a GI evaluation for possible esophageal etiology of his pain.     History of Presenting Illness:   History provided by:  Patient, wife, medical record  Samuel Koch is a 48 y.o. male     He complains of left-sided chest pain that started earlier today after he was chopping wood. No nausea/vomiting. Chronic shortness of breath. He has CHF but no known coronary disease. No cough or fever. No chest trauma recently. He has had chest pain episodes intermittently for the past 4 years and does not have a clear diagnosis. He had an abnormal stress test that led to a cardiac catheterization earlier this year. There was no significant coronary disease and the stress test was considered a false positive.      Past Medical History:     Past Medical History:   Diagnosis Date    Cardiomyopathy     Congestive heart failure     Diabetes mellitus     Hypertension     PVC's (premature ventricular contractions)        Past Surgical History:     Past Surgical History:   Procedure Laterality Date    APPENDECTOMY      CARDIAC PACEMAKER PLACEMENT      RIGHT & LEFT HEART CATH  W/ CORONARY ANGIOS, LV/LA Right 03/30/2018    Procedure: RIGHT & LEFT HEART CATH  W/ CORONARY ANGIOS, LV/LA;  Surgeon: Santo Held, MD;  Location: [facility] CATH/EP;  Service: Cardiovascular;  Laterality: Right;        Family History:     Family History   Problem Relation Age of Onset    Heart disease Father     Hypertension Father     Heart disease Sister     Heart attack Sister     Heart disease Brother     Diabetes Brother     Diabetes Paternal Grandmother        Social History:     Social History     Socioeconomic History    Marital status: Married     Spouse name: Not on file    Number of children: Not on file    Years of education: Not on file    Highest education level: Not on file   Occupational History    Not on file   Social Needs    Financial resource strain: Not on file    Food insecurity     Worry: Not on file     Inability: Not on file    Transportation needs     Medical: Not on file     Non-medical: Not on file   Tobacco Use    Smoking status: Former Smoker    Smokeless tobacco: Former Neurosurgeon  Substance and Sexual Activity    Alcohol use: No    Drug use: No    Sexual activity: Not on file     Comment: Deferred   Lifestyle    Physical activity     Days per week: Not on file     Minutes per session: Not on file    Stress: Not on file   Relationships    Social connections     Talks on phone: Not on file     Gets together: Not on file     Attends religious service: Not on file     Active member of club or organization: Not on file     Attends meetings of clubs or organizations: Not on file     Relationship status: Not on file    Intimate partner violence     Fear of current or ex partner: Not on file     Emotionally abused: Not on file     Physically abused: Not on file     Forced sexual activity: Not on file   Other Topics Concern    Not on file   Social History Narrative    Not on file       Allergies:   No Known Allergies    Medications:     Discharge Medication List as of 12/26/2018  8:21 PM      CONTINUE these medications which have NOT CHANGED    Details   atorvastatin (LIPITOR) 20 MG tablet TAKE 1 TABLET BY MOUTH ONCE DAILY AT NIGHT, E-Rx      bumetanide (BUMEX) 2 MG tablet Take 2  mg by mouth 2 (two) times daily, Historical Med      lisinopril (ZESTRIL) 10 MG tablet Take 10 mg by mouth daily, Starting Sat 11/14/2018, Historical Med      metFORMIN (GLUCOPHAGE) 500 MG tablet TAKE 2 TABLETS BY MOUTH ONCE DAILY IN THE MORNING WITH BREAKFAST, E-Rx      metoprolol succinate XL (Toprol XL) 50 MG 24 hr tablet Take 1.5 tablets (75 mg total) by mouth nightly, Starting Wed 10/28/2018, Until Thu 10/28/2019, E-Rx      potassium chloride (KLOR-CON) 10 MEQ tablet Take 1 tablet (10 mEq total) by mouth daily dose, Starting Wed 10/28/2018, E-Rx      spironolactone (ALDACTONE) 25 MG tablet Take 1 tablet by mouth once daily, E-Rx      Blood Glucose Monitor, generic, Kit Use as directed to monitor blood sugar, Print      Blood Glucose Monitoring Suppl (OneTouch Ultra 2) w/Device Kit USE AS DIRECTED TO MONITOR BLOOD SUGAR, Historical Med      Continuous Blood Gluc Sensor (FreeStyle Libre 2 Sensor Systm) Misc 1 Device by Does not apply route every 14 (fourteen) days, Starting Wed 12/09/2018, E-Rx      glucose blood test strip Check BS 5 times daily, E-Rx      insulin glargine (LANTUS) 100 UNIT/ML injection Inject 22 Units into the skin every 12 (twelve) hours Pt states that he has not been compliant in the past few days with taking his insulin secondary to weight gain, but did take it this morning., Starting Wed 12/09/2018, E-Rx      insulin lispro, 1 Unit Dial, (HumaLOG KwikPen) 100 UNIT/ML Solution Pen-injector injection pen Inject 8 Units into the skin 3 (three) times daily before meals, Starting Wed 12/09/2018, Normal      Insulin Pen Needle (PEN NEEDLES 3/16") 31G X 5 MM Misc 1 each by Does  not apply route 4 (four) times daily, Starting Mon 03/30/2018, Normal      Lancets Ultra Fine Misc Check BS 5 times daily, E-Rx      Semaglutide,0.25 or 0.5MG /DOS, (Ozempic, 0.25 or 0.5 MG/DOSE,) 2 MG/1.5ML Solution Pen-injector Inject 1 mg into the skin once a week 0.25mg  once a week for 4 weeks,0.5mg  once a week for 4 weeks,  then 1 mg once a week, Starting Wed 12/09/2018, E-Rx              Review of Systems:   Constitutional:  No fever.  No chills    Eyes:  No blurred vision.    Nose:  No congestion.  No discharge    Throat:  No sore throat.  No difficulty swallowing.    Cardiovascular: + chest pain.  No palpitations.    Respiratory: No cough.  No shortness of breath.    GI:  No abdominal pain.  No nausea.  No vomiting.  No diarrhea.    GU:  No dysuria.    Neurological:  No headache.  No weakness.    Musculoskeletal:  No pain.    Skin:  No rash.  No skin lesions.    All other systems reviewed and negative except as above, pertinent findings in HPI.      Physical Exam:   Blood pressure 101/54, pulse 65, temperature 98.5 F (36.9 C), temperature source Oral, resp. rate 18, height 1.88 m, weight 149.5 kg, SpO2 100 %.    Constitutional:  Vitals signs reviewed. Well-appearing.  Obese. No acute distress.    Head:  Atraumatic, normocephalic    Eyes:  Pupils equal, round, and reactive to light.  Conjunctiva no injection or erythema    Nose:  Mucous membranes moist.  No discharge.     Mouth & Throat:   No erythema.  No exudates     Neck:  Supple, non tender.  No cervical lymphadenopathy.    Respiratory:  Breath sounds normal.  No distress    Cardiovascular:  Heart regular rate and rhythm.  No murmurs/gallops/rubs.    Abdomen:  Soft. Non-tender. No distension    Back:  No CVA tenderness bilaterally    Extremities:  Full range of motion.  No edema.  No cyanosis.  No deformity.    Skin:  Warm.  Dry.  No pallor.  No rashes.  No lesions.  No bruises    Neurological:  Alert. Oriented to person,place, time.  GCS 15.  No focal motor deficits. Cranial Nerves II-XII intact.     Psychiatric:  Normal affect.  No anxiety.  No depression.  No agitation.      Lab Results     Labs Reviewed   CBC AND DIFFERENTIAL - Abnormal; Notable for the following components:       Result Value    WBC 11.8 (*)     All other components within normal limits   COMPREHENSIVE  METABOLIC PANEL - Abnormal; Notable for the following components:    Glucose 231 (*)     Creatinine 1.36 (*)     BUN 27 (*)     All other components within normal limits   TROPONIN I   TROPONIN I       Radiology Results     XR Chest 2 Views   Final Result   IMPRESSION:       1.  No definite radiographic evidence of acute cardiopulmonary disease.      ReadingStation:[id number]  Labs and Radiological Studies Reviewed    Final Impression     Final diagnoses:   Chest pain, unspecified type       Disposition     ED Disposition     ED Disposition Condition Date/Time Comment    Discharge  Sat Dec 26, 2018  8:21 PM Patricia Nettle Woodin discharge to home/self care.    Condition at disposition: Stable          Follow-Up Provider   Tandy Gaw, MD  [address]  [place] [address]  [phone]          Luz Brazen, MD  [address]  [place] [address]  [phone]                Marvene Staff, MD     Marvene Staff, MD  12/26/18 2227

## 2018-12-26 NOTE — ED Triage Notes (Signed)
Patient presents to the ED with c/o chest pain and SOB worsening with exertion. Endorses intermittent dizziness and frontal headache as well. States symptoms starting approx 3 hours prior to arrival, believes he "over did it" while chopping woods. States symptoms began while chopping wood. Endorses hx of CHF, has a pacemaker, hx of DM2. States his BG have been in the 200s which is normal for him, states still trying to get it under control. Denies cough, N/V/D, urinary symptoms, fever.

## 2018-12-28 ENCOUNTER — Ambulatory Visit: Payer: 59 | Attending: Registered Nurse

## 2018-12-28 VITALS — Wt 326.6 lb

## 2018-12-28 DIAGNOSIS — Z713 Dietary counseling and surveillance: Secondary | ICD-10-CM | POA: Insufficient documentation

## 2018-12-28 DIAGNOSIS — E1165 Type 2 diabetes mellitus with hyperglycemia: Secondary | ICD-10-CM

## 2018-12-28 NOTE — Progress Notes (Signed)
S:  I was doing really well controlling my diabetes right after diagnosis 4 years ago, I lost from 345 lb down to 285 lbs and my A1c went completely back to normal on no medications, now that I am taking insulin because I have CHF and I had to stop my 5 miles a day walking, my sugar went up then the insulin was added, without it my feet are burning, even with it when I eat something I shouldn't they burn and if they are burning I even take an extra 8 units of Humalog before bed to help with the pain, I know my sugar is high when my feet burn, I don't eat breakfast but I still take the 8 units and I am hungry before lunch, I did put on a Libre, but it fell off after 3 days so I am now using the Ultra 2, I check fasting and sometimes I will check other times in the day, fasting my sugar is 240-260 mg/dl, I did have a low down to 50 once, I would drink OJ if it happened again, about a cup, I don't carry anything with me, I used to eat a 70% vegetable, 30% meat meal plan but haven't been following that recently    O:  Wt 326.6 bls, A1c 10.8% on 11-06-2018 up from 8.2% on 03-26-18    A:  Diabetes type 2 uncontrolled with hyperglycemia using metformin 3 pills at 500 mg per day, 22 units of Lantus BID and 8 units of Humalog AC and sometimes on his own HS     P:  Assessment completed, see media, discussed with patient skin tac and adhesive removal pads teaching him not to use the adhesive in the center where the fiber would go through his skin, taught patient the concept of gap from blood glucose to interstitial CGM glucose, discussed with patient the concept of correction insulin and nutritional insulin, right now due to glucose being so high him taking the 8 units ACB and skipping breakfast are not causing lows but will eventually, reviewed the rule of 15 and treating a low with 15 grams of carb and checking BG again in 15 min, discussed neuropathy and his is currently temporary but the need to control his BG to prevent  permanent burning in his feet regardless of glucose control, discussed weight gain when glucose is being controlled, need to lower calories, patient was not willing to schedule an appointment for classes or with the dietitian at this time but I alerted him this assessment is good for 1 year so he can call us in Feb as he said he plans to, reviewed the healthy eating basic carb counting sheet with max carbs per meal instructions

## 2018-12-29 LAB — ECG 12-LEAD
P-R Interval: 152 ms
Patient Age: 48 years
Q-T Interval(Corrected): 413 ms
Q-T Interval: 413 ms
QRS Axis: 57 deg
QRS Duration: 87 ms
T Axis: 41 years
Ventricular Rate: 60 //min

## 2019-01-08 ENCOUNTER — Emergency Department: Payer: 59

## 2019-01-08 ENCOUNTER — Emergency Department
Admission: EM | Admit: 2019-01-08 | Discharge: 2019-01-09 | Disposition: A | Discharge status: Home / Self Care | Payer: 59 | Attending: Emergency Medicine | Admitting: Emergency Medicine

## 2019-01-08 DIAGNOSIS — U071 COVID-19: Secondary | ICD-10-CM | POA: Insufficient documentation

## 2019-01-08 DIAGNOSIS — J189 Pneumonia, unspecified organism: Secondary | ICD-10-CM

## 2019-01-08 DIAGNOSIS — J1289 Other viral pneumonia: Secondary | ICD-10-CM | POA: Insufficient documentation

## 2019-01-08 LAB — ECG 12-LEAD
P Wave Axis: 43 deg
P-R Interval: 176 ms
Patient Age: 48 years
Q-T Interval(Corrected): 418 ms
Q-T Interval: 358 ms
QRS Axis: -25 deg
QRS Duration: 86 ms
T Axis: 51 years
Ventricular Rate: 82 //min

## 2019-01-08 NOTE — ED Triage Notes (Signed)
Pt here today for chest pain, SOB, fatigue, aching, chills, dizziness, cough. Per pt low grade fever at home. Pt was at a family gathering for Thanksgiving- 5 days ago started having symptoms. Other family members have tested positive for covid. Hx of CHF, pacemaker, diabetes type 2.

## 2019-01-08 NOTE — ED Notes (Signed)
Bed: C13-A  Expected date:   Expected time:   Means of arrival:   Comments:

## 2019-01-08 NOTE — ED Notes (Signed)
This RN walked covid swab up to lab. Swabbed from nares by Dr. Nechama Guard.

## 2019-01-08 NOTE — ED Provider Notes (Signed)
ED DOC NOTE     History provided by the patient and the spouse.    History is limited by none    HPI     48 y.o. male here for shortness of breath.  States he presented to [facility] medical clinic 5 days ago for 24-hour symptoms of headache muscle aches cough low-grade fever.  Multiple contacts with Covid including his sister and family members from out of town who came and gave "everyone Covid".  Sister has been sick.  Brother-in-law has been sick.  His mother has been sick.  He is here today for low oxygen saturation.  Was given a pulse ox to use at home he states it dropped to 87% intermittently.  He has been on no medications.  He is diabetic.  Have a history of congestive heart failure and obesity.        Associated symptoms as above        Review of Systems   Review of Systems   All other systems reviewed and are negative.          PAST MEDICAL/SURGICAL HISTORY:  Past Medical History:   Diagnosis Date    Cardiomyopathy     Congestive heart failure     Diabetes mellitus     Hypertension     PVC's (premature ventricular contractions)       Past Surgical History:   Procedure Laterality Date    APPENDECTOMY      CARDIAC PACEMAKER PLACEMENT      RIGHT & LEFT HEART CATH  W/ CORONARY ANGIOS, LV/LA Right 03/30/2018    Procedure: RIGHT & LEFT HEART CATH  W/ CORONARY ANGIOS, LV/LA;  Surgeon: Santo Held, MD;  Location: [facility] CATH/EP;  Service: Cardiovascular;  Laterality: Right;     Additional Past Medical/Surgical History:       SOCIAL AND FAMILY HISTORY:  Social History     Tobacco Use    Smoking status: Former Smoker    Smokeless tobacco: Former Neurosurgeon    Tobacco comment: when 48 years old   Substance Use Topics    Alcohol use: No    Drug use: No     Family History   Problem Relation Age of Onset    Heart disease Father     Hypertension Father     Heart disease Sister     Heart attack Sister     Heart disease Brother     Diabetes Brother     Diabetes Paternal Production assistant, radio Social and  Family History:  Additional SH: [x]   I reviewed SH above       Additional FH: [x]  I reviewed FH above    No Known Allergies  Additional Allergies:    Prior to Admission medications    Medication Sig Start Date End Date Taking? Authorizing Provider   atorvastatin (LIPITOR) 20 MG tablet TAKE 1 TABLET BY MOUTH ONCE DAILY AT NIGHT 12/24/18   Adonis Brook, MD   Blood Glucose Monitor, generic, Kit Use as directed to monitor blood sugar 11/05/18   Jimmy Picket, FNP   Blood Glucose Monitoring Suppl (OneTouch Ultra 2) w/Device Kit USE AS DIRECTED TO MONITOR BLOOD SUGAR 11/06/18   [provider]   bumetanide (BUMEX) 2 MG tablet Take 2 mg by mouth 2 (two) times daily    [provider]   Continuous Blood Gluc Sensor (FreeStyle Libre 2 Sensor Systm) Misc 1 Device by Does not  apply route every 14 (fourteen) days 12/09/18   Jimmy Picket, FNP   glucose blood test strip Check BS 5 times daily 11/05/18   Jimmy Picket, FNP   insulin glargine (LANTUS) 100 UNIT/ML injection Inject 22 Units into the skin every 12 (twelve) hours Pt states that he has not been compliant in the past few days with taking his insulin secondary to weight gain, but did take it this morning.  Patient taking differently: Inject 22 Units into the skin every 12 (twelve) hours Taking consistently   12/09/18   Riser, Norvel Richards, FNP   insulin lispro, 1 Unit Dial, (HumaLOG KwikPen) 100 UNIT/ML Solution Pen-injector injection pen Inject 8 Units into the skin 3 (three) times daily before meals 12/09/18   Jimmy Picket, FNP   Insulin Pen Needle (PEN NEEDLES 3/16") 31G X 5 MM Misc 1 each by Does not apply route 4 (four) times daily 03/30/18   Rolin Barry, NP   Lancets Ultra Fine Misc Check BS 5 times daily 11/05/18   Jimmy Picket, FNP   lisinopril (ZESTRIL) 10 MG tablet Take 10 mg by mouth daily 11/14/18   [provider]   metFORMIN (GLUCOPHAGE) 500 MG tablet TAKE 2 TABLETS BY MOUTH ONCE DAILY IN THE MORNING WITH BREAKFAST  Patient taking  differently: Taking 2 tablets in the morning and 1 in the evening.   11/10/18   Elizbeth Squires, NP   metoprolol succinate XL (Toprol XL) 50 MG 24 hr tablet Take 1.5 tablets (75 mg total) by mouth nightly 10/28/18 10/28/19  Elizbeth Squires, NP   potassium chloride (KLOR-CON) 10 MEQ tablet Take 1 tablet (10 mEq total) by mouth daily dose 10/28/18   Elizbeth Squires, NP   Semaglutide,0.25 or 0.5MG /DOS, (Ozempic, 0.25 or 0.5 MG/DOSE,) 2 MG/1.5ML Solution Pen-injector Inject 1 mg into the skin once a week 0.25mg  once a week for 4 weeks,0.5mg  once a week for 4 weeks, then 1 mg once a week 12/09/18   Jimmy Picket, FNP   spironolactone (ALDACTONE) 25 MG tablet Take 1 tablet by mouth once daily 12/24/18   Adonis Brook, MD       PHYSICAL EXAM   Vitals:    01/09/19 0017   BP: 128/74   Pulse: 74   Resp: (!) 33   Temp:    SpO2: 95%     [x]  Nursing note reviewed [x]  Vitals reviewed   Pulse Oximetry on Room Air Normal  Physical Exam  Constitutional:       Appearance: He is obese. He is not ill-appearing.   Eyes:      Pupils: Pupils are equal, round, and reactive to light.   Neck:      Musculoskeletal: Normal range of motion.   Cardiovascular:      Rate and Rhythm: Normal rate and regular rhythm.   Pulmonary:      Effort: Pulmonary effort is normal.      Breath sounds: Normal breath sounds.   Abdominal:      Palpations: Abdomen is soft.   Musculoskeletal: Normal range of motion.      Right lower leg: No edema.      Left lower leg: No edema.   Skin:     General: Skin is warm and dry.   Neurological:      General: No focal deficit present.      Mental Status: He is alert.         LABS/TESTS:  Results  Procedure Component Value Units Date/Time    C Reactive Protein [[id number]] Collected: 01/08/19 2331    Specimen: Plasma Updated: 01/09/19 0115     C-Reactive Protein 0.48 mg/dL     D-dimer, quantitative [[id number]] Collected: 01/08/19 2331    Specimen: Blood Updated: 01/09/19 0041     D-Dimer 0.35 mg/L FEU     Troponin I [[id number]]  Collected: 01/08/19 2331    Specimen: Plasma Updated: 01/09/19 0035     Troponin I 0.01 ng/mL     B-type Natriuretic Peptide [[id number]] Collected: 01/08/19 2331    Specimen: Blood Updated: 01/09/19 0035     B-Natriuretic Peptide <10.0 pg/mL     CBC and differential [[id number]] Collected: 01/08/19 2331    Specimen: Blood Updated: 01/09/19 0027     WBC 4.4 K/cmm      RBC 4.31 M/cmm      Hemoglobin 13.2 gm/dL      Hematocrit 36.6 %      MCV 92 fL      MCH 31 pg      MCHC 33 gm/dL      RDW 44.0 %      PLT CT 207 K/cmm      MPV 7.7 fL      Neutrophils % 60.3 %      Lymphocytes 27.8 %      Monocytes 10.0 %      Eosinophils % 1.3 %      Basophils % 0.5 %      Neutrophils Absolute 2.6 K/cmm      Lymphocytes Absolute 1.2 K/cmm      Monocytes Absolute 0.4 K/cmm      Eosinophils Absolute 0.1 K/cmm      Basophils Absolute 0.0 K/cmm     Basic Metabolic Panel [[id number]]  (Abnormal) Collected: 01/08/19 2331    Specimen: Plasma Updated: 01/09/19 0027     Sodium 137 mMol/L      Potassium 4.2 mMol/L      Chloride 105 mMol/L      CO2 25 mMol/L      Calcium 8.7 mg/dL      Glucose 387 mg/dL      Creatinine 5.64 mg/dL      BUN 16 mg/dL      Anion Gap 33.2 mMol/L      BUN / Creatinine Ratio 15.2 Ratio      EGFR 82 mL/min/1.72m2      Osmolality Calculated 279 mOsm/kg     SARS-CoV-2 Assay ([employer] System(TM)) [[id number]] Collected: 01/08/19 2316    Specimen: Nasopharyngeal Swab Updated: 01/08/19 2330    Narrative:      Specimen source - Nasopharyngeal Swab               Xr Chest Ap Portable    Result Date: 01/08/2019  Right basilar pneumonia ReadingStation:[id number]            EKG:   I have interpreted the EKG at the time it was performed and my official reading is as follows:  Last EKG Result     Procedure Component Value Units Date/Time    ECG 12 lead [[id number]] Collected: 01/08/19 2302     Updated: 01/08/19 2308     Patient Age 58 years      Patient DOB 12/06/1970     Patient Height --     Patient Weight --     Interpretation  Text --     Sinus rhythm  Borderline left axis deviation  Low voltage, extremity leads  Abnormal inferior Q waves  Consider anterior infarct  Electronically Signed On 01-08-2019 23:08:19 EST by Elwin Mocha       Physician Interpreter [facility]     Ventricular Rate 82 //min      QRS Duration 86 ms      P-R Interval 176 ms      Q-T Interval 358 ms      Q-T Interval(Corrected) 418 ms      P Wave Axis 43 deg      QRS Axis -25 deg      T Axis 51 years             ED COURSE:  BP 128/74    Pulse 74    Temp 98.4 F (36.9 C) (Temporal)    Resp (!) 33    Ht 1.88 m    Wt 148.9 kg    SpO2 95%    BMI 42.15 kg/m   ED Course as of Jan 09 148   Sat Jan 09, 2019   0105 B-Natriuretic Peptide: <10.0 [SB]   0105 Troponin I: 0.01 [SB]   0105 D-Dimer: 0.35 [SB]      ED Course User Index  [SB] Harriette Bouillon, MD         PROCEDURES:        IMPRESSION:     48 y.o. male with  1. Pneumonia of right lower lobe due to infectious organism        DIFFERENTIAL DIAGNOSIS:  Includes but is not limited to  CHF, AMI, PNEUMONIA, PULMONARY EMBOLUS, COPD EXACERBATION,BRONCHITIS, ASTHMA EXACERBATION, PNEUMOTHORAX, PERICARDIAL EFFUSION, PERICARDIAL TAMPONADE      MEDICAL DECISION MAKING:  I have evaluated all labs and imaging studies in the scope of a single Emergency Department visit and have determined that at this time an acute life threatening illness  does not exist. I have explained all results to the patient and all questions have been answered.  The patient have been given strict return instructions to return to the ER for any worsening or changing symptoms and that they should follow up with a primary care provider. The patient was discharge home in stable condition.      PLAN:   ED Disposition     ED Disposition Condition Date/Time Comment    Discharge  Sat Jan 09, 2019  1:05 AM Patricia Nettle Tarrant discharge to home/self care.    Condition at disposition: Stable        New Prescriptions    AZITHROMYCIN (ZITHROMAX) 250 MG TABLET    Take 500  mg (2 tablets) the first day and then 250 mg (1 tablet) every day until completed.          Harriette Bouillon, MD  01/09/19 [phone]

## 2019-01-09 LAB — CBC AND DIFFERENTIAL
Basophils %: 0.5 % (ref 0.0–3.0)
Basophils Absolute: 0 10*3/uL (ref 0.0–0.3)
Eosinophils %: 1.3 % (ref 0.0–7.0)
Eosinophils Absolute: 0.1 10*3/uL (ref 0.0–0.8)
Hematocrit: 39.5 % (ref 39.0–52.5)
Hemoglobin: 13.2 gm/dL (ref 13.0–17.5)
Lymphocytes Absolute: 1.2 10*3/uL (ref 0.6–5.1)
Lymphocytes: 27.8 % (ref 15.0–46.0)
MCH: 31 pg (ref 28–35)
MCHC: 33 gm/dL (ref 32–36)
MCV: 92 fL (ref 80–100)
MPV: 7.7 fL (ref 6.0–10.0)
Monocytes Absolute: 0.4 10*3/uL (ref 0.1–1.7)
Monocytes: 10 % (ref 3.0–15.0)
Neutrophils %: 60.3 % (ref 42.0–78.0)
Neutrophils Absolute: 2.6 10*3/uL (ref 1.7–8.6)
PLT CT: 207 10*3/uL (ref 130–440)
RBC: 4.31 10*6/uL (ref 4.00–5.70)
RDW: 11.2 % (ref 11.0–14.0)
WBC: 4.4 10*3/uL (ref 4.0–11.0)

## 2019-01-09 LAB — BASIC METABOLIC PANEL
Anion Gap: 11.2 mMol/L (ref 7.0–18.0)
BUN / Creatinine Ratio: 15.2 Ratio (ref 10.0–30.0)
BUN: 16 mg/dL (ref 7–22)
CO2: 25 mMol/L (ref 20–30)
Calcium: 8.7 mg/dL (ref 8.5–10.5)
Chloride: 105 mMol/L (ref 98–110)
Creatinine: 1.05 mg/dL (ref 0.80–1.30)
EGFR: 82 mL/min/{1.73_m2} (ref 60–150)
Glucose: 166 mg/dL — ABNORMAL HIGH (ref 71–99)
Osmolality Calculated: 279 mOsm/kg (ref 275–300)
Potassium: 4.2 mMol/L (ref 3.5–5.3)
Sodium: 137 mMol/L (ref 136–147)

## 2019-01-09 LAB — B-TYPE NATRIURETIC PEPTIDE: B-Natriuretic Peptide: <10 pg/mL (ref 0.0–100.0)

## 2019-01-09 LAB — TROPONIN I: Troponin I: 0.01 ng/mL (ref 0.00–0.02)

## 2019-01-09 LAB — C-REACTIVE PROTEIN: C-Reactive Protein: 0.48 mg/dL (ref 0.02–0.80)

## 2019-01-09 LAB — D-DIMER, QUANTITATIVE: D-Dimer: 0.35 mg/L FEU (ref 0.19–0.52)

## 2019-01-09 MED ORDER — AZITHROMYCIN 250 MG PO TABS
ORAL_TABLET | ORAL | 0 refills | Status: AC
Start: 2019-01-09 — End: 2019-01-14

## 2019-01-09 NOTE — Discharge Instructions (Signed)
Pneumonia (Adult)  Pneumonia is an infection deep in the lungs. It is in the small air sacs (alveoli). It may be caused by a virus, fungus, or bacteria. Pneumonia caused by bacteriais often treated with an antibiotic. Severe cases may need to be treated in the hospital. Milder cases can be treated at home. Pneumonia symptoms are a lot like flu symptoms. They include fever, cough (dry or with phlegm), headache, muscle weakness, and pain. These symptoms often get worse in the first 2 days. But they often start to get better in the first week of treatment.     Home care  Follow these guidelines when caring for yourself at home:    Get plenty of rest. Don't let yourself get overly tired when you go back to your activities. Participate in activities as directed by your healthcare provider.   Stop smoking. This is the most important step you can take to help treat pneumonia. If you need help stopping smoking, talk with your healthcare provider.   Stay away from smoke and other irritants. Stay away from secondhand smoke. Don't let anyone smoke in your home.   Prevent lung infections. Ask your healthcare provider about the flu and pneumonia vaccines. Take steps to prevent colds and other lung infections.   Practice correct handwashing. Wash your hands often with soap and water. Use hand sanitizer when you can't wash your hands. Stay away from crowds during cold and flu season.   Use pain medicine as directed. You may use acetaminophen or ibuprofen to control fever or pain, unless another medicine was prescribed. If you have chronic liver or kidney disease, talk with your healthcare provider before using these medicines. Also talk with your provider if you've had a stomach ulcer or GI (gastrointestinal) bleeding. Don't give aspirin to a child younger than age 19 unless directed by the provider. Taking aspirin can put a child at risk for Reye syndrome. This is a rare but very serious disorder. It most often affects  the brain and the liver.   Eat a light diet as needed. You may not feel like eating, so a light diet is fine. Follow the treatment plan as advised by your healthcare provider.   Drink plenty of water and fluids. This can make mucus thinner and easier to cough up. Ask your healthcare provider how much water you should drink. For many people, 6 to 8 glasses (8 ounces each) a day is a good goal. Other fluids include sport drinks, sodas without caffeine, juices, tea, or soup. If you also have heart or kidney disease, check with your provider before you drink extra fluids.   Finish all prescription medicine. Take antibiotic or antiviral medicine as prescribed by your healthcare provider, even if you are feeling better after a few days. Take the medicine until it is all gone.   Try to stay away from air pollution. If you live in an area with air pollution, track the Air Quality Index (AQI) reports and plan your outdoor activities with the AQI recommendations in mind  Follow-up care  Follow up with your healthcare provider in the next 2 to 3 days, or as advised. This is to be sure the medicine is helping you get better.   If you are 65 or older, you should get a pneumococcal vaccine and a yearlyflu (influenza)shot. You should also get these vaccines if you have chronic lung disease such as asthma, emphysema, or COPD. A second type of pneumonia vaccine is also available for people   over age 65 and those younger than 65 with certain health conditions. Talk with your healthcare provider about which pneumococcal vaccine is best for you.   Call 911  Call 911if any of these occur:    Unable to speak or swallow   Lips or skin looks blue, purple, or gray   Feeling dizzy or faint   Unable to wake up or loss of consciousness   Feeling of doom   Trouble breathing or wheezing   Shortness of breath gets worse or doesn't get better with treatment   Rapid breathing (more than 25 breaths per minute)   Coughing up  blood   Chest pain gets worse with breathing or doesn't get better with treatment  When to get medical advice  Call your healthcare provider right away if any of these occur:   You don't get better in the first 2 days of treatment   Fever of100.4F (38C) or higher, or as directed by your healthcare provider   Shaking chills   Cough with phlegm that doesn't get better, or get worse   Shortness of breath with activities   Weakness, dizziness, or fainting that gets worse   Thirst or dry mouth that gets worse   Sinus pain, headache, or a stiff neck   Chest pain with breathing or coughing   Symptoms that get worse or not improving  [employer] last reviewed this educational content on 12/05/2017   2000-2020 [employer] [address], [place] [address]. All rights reserved. This information is not intended as a substitute for professional medical care. Always follow your healthcare professional's instructions.

## 2019-01-11 NOTE — ED Notes (Signed)
Notified patient of positive covid result.  Discussed isolation precautions and reasons to return to the ED if worsening.  Notified ordering ED provider.

## 2019-01-12 ENCOUNTER — Ambulatory Visit: Payer: 59 | Admitting: Internal Medicine

## 2019-01-12 LAB — VH SARS-COV-2 ASSAY (PERKINELMER SYSTEM(TM))
Does patient reside in a congregate care setting?: NEGATIVE
Is patient employed in a healthcare setting?: NEGATIVE
Is the patient pregnant?: NEGATIVE
SARS-CoV-2 Assay (PerkinElmer System (TM)): DETECTED — CR

## 2019-02-01 ENCOUNTER — Telehealth: Payer: Self-pay

## 2019-02-01 NOTE — Telephone Encounter (Signed)
Requested ov, labs from pcp

## 2019-02-04 NOTE — Telephone Encounter (Signed)
2nd request

## 2019-02-08 ENCOUNTER — Encounter: Payer: 59 | Admitting: Internal Medicine

## 2019-02-09 ENCOUNTER — Ambulatory Visit
Admission: RE | Admit: 2019-02-09 | Discharge: 2019-02-09 | Disposition: A | Discharge status: Home / Self Care | Payer: 59 | Source: Ambulatory Visit | Attending: Family | Admitting: Family

## 2019-02-09 ENCOUNTER — Encounter: Payer: Self-pay | Admitting: Family Nurse Practitioner

## 2019-02-09 VITALS — BP 139/92 | HR 81 | Resp 16 | Wt 341.0 lb

## 2019-02-09 DIAGNOSIS — I10 Essential (primary) hypertension: Secondary | ICD-10-CM

## 2019-02-09 DIAGNOSIS — I110 Hypertensive heart disease with heart failure: Secondary | ICD-10-CM | POA: Insufficient documentation

## 2019-02-09 DIAGNOSIS — I5042 Chronic combined systolic (congestive) and diastolic (congestive) heart failure: Secondary | ICD-10-CM | POA: Insufficient documentation

## 2019-02-09 DIAGNOSIS — I428 Other cardiomyopathies: Secondary | ICD-10-CM | POA: Insufficient documentation

## 2019-02-09 DIAGNOSIS — I495 Sick sinus syndrome: Secondary | ICD-10-CM

## 2019-02-09 DIAGNOSIS — Z950 Presence of cardiac pacemaker: Secondary | ICD-10-CM

## 2019-02-09 LAB — I-STAT CHEM 8 CARTRIDGE
Anion Gap I-Stat: 16 (ref 7.0–16.0)
BUN I-Stat: 18 mg/dL (ref 7–22)
Calcium Ionized I-Stat: 5.1 mg/dL (ref 4.35–5.10)
Chloride I-Stat: 109 mMol/L (ref 98–110)
Creatinine I-Stat: 0.8 mg/dL (ref 0.80–1.30)
EGFR: 104 mL/min/{1.73_m2} (ref 60–150)
Glucose I-Stat: 123 mg/dL — ABNORMAL HIGH (ref 71–99)
Hematocrit I-Stat: 41 % (ref 39.0–52.5)
Hemoglobin I-Stat: 13.9 gm/dL (ref 13.0–17.5)
Potassium I-Stat: 4 mMol/L (ref 3.5–5.3)
Sodium I-Stat: 142 mMol/L (ref 136–147)
TCO2 I-Stat: 23 mMol/L — ABNORMAL LOW (ref 24–29)

## 2019-02-09 MED ORDER — FUROSEMIDE 10 MG/ML IJ SOLN
80.00 mg | Freq: Once | INTRAMUSCULAR | Status: AC
Start: 2019-02-09 — End: 2019-02-09
  Administered 2019-02-09: 12:00:00 80 mg via INTRAVENOUS
  Filled 2019-02-09: qty 8

## 2019-02-09 MED ORDER — BUMETANIDE 2 MG PO TABS
ORAL_TABLET | ORAL | 3 refills | Status: DC
Start: 2019-02-09 — End: 2019-05-24

## 2019-02-09 MED ORDER — FUROSEMIDE 10 MG/ML IJ SOLN
200.00 mg | Freq: Once | INTRAMUSCULAR | Status: AC
Start: 2019-02-09 — End: 2019-02-09
  Administered 2019-02-09: 13:00:00 200 mg via INTRAVENOUS
  Filled 2019-02-09: qty 20

## 2019-02-09 NOTE — Progress Notes (Signed)
[facility]    I had the pleasure of seeing [name] today at [facility] for an advanced heart failure follow up.  Follows with Dr. [name] for cardiology.    He contacted the clinic today requesting an urgent add on appointment due to weight gain at home. He had covid 19 around thanksgiving and developed pneumonia. He denies any medication changes. He does not feel his diet has dramatically changed. He did notice weight gain a few weeks ago after returning from a trip but did not have time to schedule an appointment here for iv diuretics. He was unable to increase his home diuretics as he was in need of a refill. He is planning to travel to [place] for work later this week. 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     Samuel Koch is a 49 y.o. male with nonischemic cardiomyopathy, chronic combined systolic and diastolic heart failure, latest EF 50-55%, NYHA Class 3, ACC/AHA Stage C.  Has dual-chamber pacemaker in place.  Status post PVC ablation.  Also uncontrolled type II diabetic, obesity. Has established with primary care but was only seen for an initial appointment on 04/01/2018.  He is now followed by endocrinology and is scheduled to see Ms. Riser, NP on 03/11/2019.          Review of Systems       Comprehensive review of systems performed by me. Unless otherwise noted, all systems are negative.      Most recent laboratory testing reveals:   LABORATORY DATA:  Lab Results   Component Value Date    BUN 16 01/08/2019    NA 137 01/08/2019    K 4.2 01/08/2019    CL 105 01/08/2019    CO2 25 01/08/2019     Lab Results   Component Value Date    WBC 4.4 01/08/2019    HGB 13.2 01/08/2019    HCT 39.5 01/08/2019    MCV 92 01/08/2019    PLT 207 01/08/2019     Results     Procedure Component Value Units Date/Time    i-Stat Chem 8 CartrIDge [[id number]]  (Abnormal) Collected: 02/09/19 1203    Specimen: Blood Updated: 02/09/19  1222     Sodium I-Stat 142 mMol/L      Potassium I-Stat 4.0 mMol/L      Chloride I-Stat 109 mMol/L      TCO2 I-Stat 23 mMol/L      Calcium Ionized I-Stat 5.10 mg/dL      Glucose I-Stat 629 mg/dL      Creatinine I-Stat 0.80 mg/dL      BUN I-Stat 18 mg/dL      Anion Gap I-Stat 52.8     EGFR 104 mL/min/1.44m2      Hematocrit I-Stat 41.0 %      Hemoglobin I-Stat 13.9 gm/dL             Medical Problems:  Patient Active Problem List    Diagnosis Date Noted    Adjustment and management of cardiac pacemaker 07/08/2018    Class 2 severe obesity with serious comorbidity and body mass index (BMI) of 39.0 to 39.9 in adult, unspecified obesity type 07/08/2018    SSS (sick sinus syndrome) 07/08/2018    PVC (premature ventricular contraction) 07/08/2018    Chest pain 03/26/2018    NICM (nonischemic cardiomyopathy) 10/20/2017    Chronic combined systolic and diastolic HF (heart failure) 10/20/2017  Obesity, morbid, BMI 40.0-49.9 10/07/2017    DOE (dyspnea on exertion) 10/06/2017    Hyperglycemia 03/29/2017    Essential hypertension 03/29/2017    Renal insufficiency 03/29/2017    NSVT (nonsustained ventricular tachycardia) 08/17/2015    DM type 2 with diabetic dyslipidemia 07/31/2015       History from Cardiology at [facility]  1. Chronic combined systolic and diastolic CHF    2. Nonischemic cardiomyopathy  A. LHC (6/17): 30% mid RCA, no other significant disease.   B. Echo (6/17): EF 30-35%.   C. Holter (7/17): 19% PVCs.   D. Echo (3/18): EF 50-55%, moderate LVH, grade II diastolic dysfunction.   E. Echo (9/18): EF 50-55%  F. Echo 10/07/17- LV cavity size is increased. The LVEF is est. at 50%. Mild hypokinesis of the distal septum and apex which may be due to paced rhythm. Pacer wire present in RV.   G. Echo 06/12/2018- Mild concentric LVH, EF 50-55%, aortic root is mildy dilated, aortic root is 3.9cm, tricuspid regurgitation is mild, RSVP .     3. Type II diabetes    4. Hypertriglyceridemia    5. PVCs  A. 48  hour holter (7/17) with 19% PVCs.  B. PVC ablation in 8/17 but PVCs recurred. Will   C. Holter (11/17) with rare PVCs, < 1%.   D. Holter (9/18) with rare PVCs only    6. Bradycardia/ SSS  A. Dual chamber pacemaker placed (Medtronic, MRI compatible).     7. NSVT    8. CAD evaluation   A. Echo 03/27/2018- EF 50%, no regional wall motion abnormalities present, grade 2 DD.  no significant valvular heart disease.   B. Stress 03/28/2018- Completed vasodilator protocol., No ischemic changes with infusion, No arrhythmias,low risk. Perfusion defect noted thought to be diaphragmatic attenuation  Dilated LV with mildly decreased left ventricular function, stress EF 44%.   C. THC 03/30/2018- Non obstructive cad.  CO 5 CI 2.0 wedge 14 mean PA 22    Past Medical History:   Diagnosis Date    Cardiomyopathy     Congestive heart failure     Diabetes mellitus     Hypertension     PVC's (premature ventricular contractions)      Past Surgical History:   Procedure Laterality Date    APPENDECTOMY      CARDIAC PACEMAKER PLACEMENT      RIGHT & LEFT HEART CATH  W/ CORONARY ANGIOS, LV/LA Right 03/30/2018    Procedure: RIGHT & LEFT HEART CATH  W/ CORONARY ANGIOS, LV/LA;  Surgeon: Santo Held, MD;  Location: [facility] CATH/EP;  Service: Cardiovascular;  Laterality: Right;     Family History   Problem Relation Age of Onset    Heart disease Father     Hypertension Father     Heart disease Sister     Heart attack Sister     Heart disease Brother     Diabetes Brother     Diabetes Paternal Grandmother      Social History     Socioeconomic History    Marital status: Married     Spouse name: Not on file    Number of children: Not on file    Years of education: Not on file    Highest education level: Bachelor's degree (e.g., BA, AB, BS)   Occupational History    Not on file   Social Needs    Financial resource strain: Somewhat hard    Food insecurity  Worry: Never true     Inability: Never true    Transportation needs     Medical:  No     Non-medical: No   Tobacco Use    Smoking status: Former Smoker    Smokeless tobacco: Former Neurosurgeon    Tobacco comment: when 49 years old   Substance and Sexual Activity    Alcohol use: No    Drug use: No    Sexual activity: Not on file     Comment: Deferred   Lifestyle    Physical activity     Days per week: Not on file     Minutes per session: Not on file    Stress: Not on file   Relationships    Social connections     Talks on phone: Not on file     Gets together: Not on file     Attends religious service: Not on file     Active member of club or organization: Not on file     Attends meetings of clubs or organizations: Not on file     Relationship status: Not on file    Intimate partner violence     Fear of current or ex partner: Not on file     Emotionally abused: Not on file     Physically abused: Not on file     Forced sexual activity: Not on file   Other Topics Concern    Not on file   Social History Narrative    Not on file     Married   Non smoker - isolated smoking before age 73  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 270 tablet 3    Continuous Blood Gluc Sensor (FreeStyle Libre 2 Sensor Systm) Misc 1 Device by Does not apply route every 14 (fourteen) days 2 each 12    glucose blood test strip Check BS 5 times daily 150 each 11    insulin glargine (LANTUS) 100 UNIT/ML injection Inject 22 Units into the skin every 12 (twelve)  hours Pt states that he has not been compliant in the past few days with taking his insulin secondary to weight gain, but did take it this morning. (Patient taking differently: Inject 22 Units into the skin every 12 (twelve) hours Taking consistently  ) 15 pen 3    insulin lispro, 1 Unit Dial, (HumaLOG KwikPen) 100 UNIT/ML Solution Pen-injector injection pen Inject 8 Units into the skin 3 (three) times daily before meals 9 pen 3    Insulin Pen Needle (PEN NEEDLES 3/16") 31G X 5 MM Misc 1 each by Does not apply route 4 (four) times daily 901 each 1    Lancets Ultra Fine Misc Check BS 5 times daily 150 each 11    lisinopril (ZESTRIL) 10 MG tablet Take 10 mg by mouth daily  metoprolol succinate XL (Toprol XL) 50 MG 24 hr tablet Take 1.5 tablets (75 mg total) by mouth nightly 45 tablet 2    potassium chloride (KLOR-CON) 10 MEQ tablet Take 1 tablet (10 mEq total) by mouth daily dose 30 tablet 11    Semaglutide,0.25 or 0.5MG /DOS, (Ozempic, 0.25 or 0.5 MG/DOSE,) 2 MG/1.5ML Solution Pen-injector Inject 1 mg into the skin once a week 0.25mg  once a week for 4 weeks,0.5mg  once a week for 4 weeks, then 1 mg once a week 1 pen 3    spironolactone (ALDACTONE) 25 MG tablet Take 1 tablet by mouth once daily 90 tablet 3    metFORMIN (GLUCOPHAGE) 500 MG tablet TAKE 2 TABLETS BY MOUTH ONCE DAILY IN THE MORNING WITH BREAKFAST (Patient taking differently: Taking 2 tablets in the morning and 1 in the evening.  ) 60 tablet 0     Current Facility-Administered Medications   Medication Dose Route Frequency Provider Last Rate Last Admin    furosemide (LASIX) 200 mg in sodium chloride 0.9 % 50 mL IV Piggyback  200 mg Intravenous Once Inez Catalina I, NP         No Known Allergies    Vitals:    Vitals:    02/09/19 1146   BP: (!) 139/92   Pulse: 81   Resp: 16   SpO2: 98%     Weight is up 13 lbs from last office visit.     Physical Exam:   General: overweight male, alert, NAD  HEENT:  Anicteric sclera, supple, no significant  JVD.  Lungs: diminished bases, no crackles or rails.   Cardiovascular: s1s2, normal rate, regular rhythm, no m/r/g  Abdominal: obese/distended, nontender  Extremities: warm to touch, 1+ edema  Neuromuscular exam: grossly non-focal, though not formally tested.    Assessment and Plan:  1. Chronic combined systolic and diastolic HF (heart failure)     2. NICM (nonischemic cardiomyopathy)     3. SSS (sick sinus syndrome)     4. Pacemaker     5. Essential hypertension     6. Obesity, morbid, BMI 40.0-49.9         Cardiomyopathy:   My impression is that, Mr. Fauble has nonischemic cardiomyopathy, chronic systolic/diastolic heart failure ACC/AHA stage C, class III with improved EF.  Remains on beta-blocker and ace and spironolactone      In regards to OMM:  Beta-blocker: toprol XL  ACE-I/ARB/ARNI: yes  Aldosterone antagonist: yes  Hydralazine/nitrate: no   Digoxin: no    Medication changes:  We will give IV Lasix push 80 mg here in the clinic. And IV Lasix 200 mg over 1 hour.     Increase home bumex 4 mg in am and 2 mg in afternoon until he returns here.       Electronically signed by:    Deatra James. Georg Ruddle, FNP-C  Clinical Coordinator for [facility]  [facility] - [facility]  [facility]  [address]  [place], [place] [address]  [phone]   [phone] - portable phone  [phone] - fax         Note: This chart was generated by the [facility] EMR system/speech recognition and may contain inherit omission or errors not intended by the user.  Grammatical errors, random word insertions, deletions, pronoun errors and incomplete sentences are occasionally consequences of this technology due to software limitations.  Not all errors are caught or corrected.  If there are questions or concerns about  the content of this note or information contained in the body of this dictation they should be addressed directly with the author for clarification.

## 2019-02-09 NOTE — Patient Instructions (Addendum)
Heart failure causes many symptoms.   Some of them are more serious than others.   It is important to recognize when you should call 911 for emergency help and when you should call your doctor or nurse for urgent attention.    The following lists can help you decide what to do when you experience certain symptoms. You may want to print this page and keep it in a convenient spot.    Emergency Symptoms of Heart Failure  Call 911 for emergency help, if you have:    -Chest discomfort or pain that lasts more than 15 minutes that is not relieved with rest or nitroglycerin.  -Severe, persistent shortness of breath.  -Fainted or passed out.    Urgent Symptoms of Heart Failure  Call your doctor immediately, if you have any of the following symptoms:    -Increasing shortness of breath or a new shortness of breath while resting.  -Trouble sleeping due to difficulty breathing. For example, waking up suddenly at night due to difficulty breathing.  -A need to sleep sitting up or on more pillows than usual.  -Fast or irregular heart beats, palpitations, or a “racing heart” that persists and makes you feel dizzy or lightheaded.    Or if you:  -Cough up frothy or pink sputum.  -Feel like you may faint or pass out.    To manage your heart failure, please follow the following recommendations:    Fluid restriction:  You should follow a daily fluid restriction not to exceed 2 Liters daily (equivalent to 64 fluid ounces). This includes all things liquid at room temperature such as: all beverages, soup, ice cream, popsicles, jello and ice (you will need to guess how much the ice would be when melted and add toward your daily total). Keep track of your fluid intake throughout the day and measure everything.     Daily weights:  Weigh yourself every day when you wake up, right after using the bathroom. Keep a record of your daily weights and call our office if you gain more than 2-3 pounds in 1 day or 5 pounds in less than a week.     Low  sodium diet:  Sodium can lead to fluid retention and patients with heart failure should follow the low sodium diet, do not exceed 2000 mg sodium daily. Do not cook with salt or add it at the table. This includes table salt, sea salt, Kosher salt, garlic salt, seasoned salt or other seasoning's with salt (read the nutrition label). Avoid eating out at restaurants and high sodium foods such as processed meats, pre-packaged meals, pickles, soy sauce, chips, canned soups and bacon. Ask your healthcare provider for more information.    Activity:  We encourage you to engage in regular physical activity, such as daily walking. Start with short distances and walk at a comfortable pace. You should be able to "walk and talk". Gradually increase the distance and time that you walk. Exercise will help you feel better and can relieve the symptom of "heavy, tired legs". Stop exercise if you have chest pain, dizziness, or get so short of breath that you can not talk.   You should not lift/push/or pull heavy objects (anything heavy enough to make you strain).     Medications:  Take all your medications as prescribed by your health care provider and bring all your medication bottles to every appointment. It is important that you know what you are taking, when, and for what reason.      Medications you should not take:  Nonsteroidal anti-inflammatory drugs (NSAIDS): such as ibuprofen, Advil, Motrin, naprosyn, Naproxen, may cause fluid retention. You should not take these medications.    Call your healthcare provider if you are not feeling well or have new problems. We will call you back within 24 hours (Monday-Friday). In case of an emergency, call 911 or go to the nearest emergency department.      Thank you for choosing the [facility] at [facility] with [facility] for your care. It is important to keep all your medical appointments, even if you feel well. To reschedule or cancel an appointment  with our clinic, call [phone]. If you need to leave a message on the nurse line for the heart failure clinic, please call [phone].      Increase bumex 2 mg - 2 tablets in am and 1 tablet in afternoon    Please schedule an appointment with your primary care provider. You have not been seen by their office since 04/01/2018.  You are overdue for follow up with their office.     It is important for you to keep your currently scheduled endocrinology on 03/11/2019 with Ms. Cira Servant, NP.

## 2019-02-11 ENCOUNTER — Ambulatory Visit: Payer: 59 | Admitting: Family

## 2019-02-23 ENCOUNTER — Ambulatory Visit: Payer: Self-pay | Admitting: Internal Medicine

## 2019-03-01 ENCOUNTER — Ambulatory Visit (HOSPITAL_BASED_OUTPATIENT_CLINIC_OR_DEPARTMENT_OTHER)
Admission: RE | Admit: 2019-03-01 | Discharge: 2019-03-01 | Disposition: A | Discharge status: Home / Self Care | Payer: 59 | Source: Ambulatory Visit | Admitting: Internal Medicine

## 2019-03-01 DIAGNOSIS — I5042 Chronic combined systolic (congestive) and diastolic (congestive) heart failure: Secondary | ICD-10-CM

## 2019-03-01 LAB — I-STAT CHEM 8 CARTRIDGE
Anion Gap I-Stat: 14 (ref 7.0–16.0)
BUN I-Stat: 24 mg/dL — ABNORMAL HIGH (ref 7–22)
Calcium Ionized I-Stat: 5 mg/dL (ref 4.35–5.10)
Chloride I-Stat: 102 mMol/L (ref 98–110)
Creatinine I-Stat: 1.1 mg/dL (ref 0.80–1.30)
EGFR: 78 mL/min/{1.73_m2} (ref 60–150)
Glucose I-Stat: 277 mg/dL — ABNORMAL HIGH (ref 71–99)
Hematocrit I-Stat: 44 % (ref 39.0–52.5)
Hemoglobin I-Stat: 15 gm/dL (ref 13.0–17.5)
Potassium I-Stat: 3.6 mMol/L (ref 3.5–5.3)
Sodium I-Stat: 139 mMol/L (ref 136–147)
TCO2 I-Stat: 27 mMol/L (ref 24–29)

## 2019-03-01 MED ORDER — VH POTASSIUM CHLORIDE CRYS ER 20 MEQ PO TBCR (WRAP)
20.00 meq | EXTENDED_RELEASE_TABLET | Freq: Once | ORAL | Status: AC
Start: 2019-03-01 — End: 2019-03-01
  Administered 2019-03-01: 15:00:00 20 meq via ORAL
  Filled 2019-03-01: qty 1

## 2019-03-01 MED ORDER — BUMETANIDE 0.25 MG/ML IJ SOLN
2.50 mg | Freq: Once | INTRAMUSCULAR | Status: AC
Start: 2019-03-01 — End: 2019-03-01
  Administered 2019-03-01: 2.5 mg via INTRAVENOUS
  Filled 2019-03-01: qty 10

## 2019-03-01 NOTE — Patient Instructions (Signed)
IV Bumex push 2.5 mg today.    Continue with home Bumex 4 mg in the morning 2 mg in the afternoon.  Keep your endocrine appointment, make appointment with primary care.  1 month follow-up.

## 2019-03-01 NOTE — Progress Notes (Signed)
[facility]    I had the pleasure of seeing Samuel Koch today at [facility] for an advanced heart failure follow up.  Follows with Dr. [name] for cardiology.    Samuel Koch is a 49 y.o. male with nonischemic cardiomyopathy, chronic combined systolic and diastolic heart failure, latest EF 50-55%, NYHA Class 3, ACC/AHA Stage C.  Has dual-chamber pacemaker in place.  Status post PVC ablation.  Also uncontrolled type II diabetic, obesity.   He is followed by endocrinology and is scheduled to see Ms. Riser, NP on 03/11/2019.      At last visit, remains significantly volume overloaded and decompensated.  Received IV Lasix push 80 mg with infusion of 200 mg over 1 hour, increase home Bumex to 4 mg in the morning and 2 mg in the afternoon.    Today, he is doing better, less abdominal swelling although still with distention, no swelling in the legs.  Blood pressure heart rate well controlled.  Does occasionally feel some heart racing, but not long-lasting.  Did recently come back from a trip from [place] with where he was primarily in the car a lot, had some dietary discretion, but weight was still able to come down overall, currently 331 pounds.    I-STAT lab work today reveals potassium 3.6, bicarb 27, creatinine 1.1.  Still with some orthopnea, overall NYHA class III symptoms.  Not exercising as much as he would like to.  Asked today if it safe for him to take prescription medication for erectile dysfunction such as Viagra or Cialis.        Review of Systems       Comprehensive review of systems performed by me. Unless otherwise noted, all systems are negative.      Most recent laboratory testing reveals:   LABORATORY DATA:  Lab Results   Component Value Date    BUN 16 01/08/2019    NA 137 01/08/2019    K 4.2 01/08/2019    CL 105 01/08/2019    CO2 25 01/08/2019     Lab Results   Component Value Date    WBC 4.4 01/08/2019    HGB 13.2 01/08/2019    HCT  39.5 01/08/2019    MCV 92 01/08/2019    PLT 207 01/08/2019     Results     Procedure Component Value Units Date/Time    i-Stat Chem 8 CartrIDge [[id number]]  (Abnormal) Collected: 03/01/19 1447    Specimen: Blood Updated: 03/01/19 1501     Sodium I-Stat 139 mMol/L      Potassium I-Stat 3.6 mMol/L      Chloride I-Stat 102 mMol/L      TCO2 I-Stat 27 mMol/L      Calcium Ionized I-Stat 5.00 mg/dL      Glucose I-Stat 409 mg/dL      Creatinine I-Stat 1.10 mg/dL      BUN I-Stat 24 mg/dL      Anion Gap I-Stat 81.1     EGFR 78 mL/min/1.45m2      Hematocrit I-Stat 44.0 %      Hemoglobin I-Stat 15.0 gm/dL             Medical Problems:  Patient Active Problem List    Diagnosis Date Noted    Adjustment and management of cardiac pacemaker 07/08/2018    Class 2 severe obesity with serious comorbidity and body mass index (BMI) of 39.0 to 39.9 in adult, unspecified obesity type 07/08/2018  SSS (sick sinus syndrome) 07/08/2018    PVC (premature ventricular contraction) 07/08/2018    Chest pain 03/26/2018    NICM (nonischemic cardiomyopathy) 10/20/2017    Chronic combined systolic and diastolic HF (heart failure) 10/20/2017    Obesity, morbid, BMI 40.0-49.9 10/07/2017    DOE (dyspnea on exertion) 10/06/2017    Hyperglycemia 03/29/2017    Essential hypertension 03/29/2017    Renal insufficiency 03/29/2017    NSVT (nonsustained ventricular tachycardia) 08/17/2015    DM type 2 with diabetic dyslipidemia 07/31/2015       History from Cardiology at [facility] [place]  1. Chronic combined systolic and diastolic CHF    2. Nonischemic cardiomyopathy  A. LHC (6/17): 30% mid RCA, no other significant disease.   B. Echo (6/17): EF 30-35%.   C. Holter (7/17): 19% PVCs.   D. Echo (3/18): EF 50-55%, moderate LVH, grade II diastolic dysfunction.   E. Echo (9/18): EF 50-55%  F. Echo 10/07/17- LV cavity size is increased. The LVEF is est. at 50%. Mild hypokinesis of the distal septum and apex which may be due to paced rhythm. Pacer wire  present in RV.   G. Echo 06/12/2018- Mild concentric LVH, EF 50-55%, aortic root is mildy dilated, aortic root is 3.9cm, tricuspid regurgitation is mild, RSVP .     3. Type II diabetes    4. Hypertriglyceridemia    5. PVCs  A. 48 hour holter (7/17) with 19% PVCs.  B. PVC ablation in 8/17 but PVCs recurred. Will   C. Holter (11/17) with rare PVCs, < 1%.   D. Holter (9/18) with rare PVCs only    6. Bradycardia/ SSS  A. Dual chamber pacemaker placed (Medtronic, MRI compatible).     7. NSVT    8. CAD evaluation   A. Echo 03/27/2018- EF 50%, no regional wall motion abnormalities present, grade 2 DD.  no significant valvular heart disease.   B. Stress 03/28/2018- Completed vasodilator protocol., No ischemic changes with infusion, No arrhythmias,low risk. Perfusion defect noted thought to be diaphragmatic attenuation  Dilated LV with mildly decreased left ventricular function, stress EF 44%.   C. THC 03/30/2018- Non obstructive cad.  CO 5 CI 2.0 wedge 14 mean PA 22    Past Medical History:   Diagnosis Date    Cardiomyopathy     Congestive heart failure     Diabetes mellitus     Hypertension     PVC's (premature ventricular contractions)      Past Surgical History:   Procedure Laterality Date    APPENDECTOMY      CARDIAC PACEMAKER PLACEMENT      RIGHT & LEFT HEART CATH  W/ CORONARY ANGIOS, LV/LA Right 03/30/2018    Procedure: RIGHT & LEFT HEART CATH  W/ CORONARY ANGIOS, LV/LA;  Surgeon: Santo Held, MD;  Location: [facility] CATH/EP;  Service: Cardiovascular;  Laterality: Right;     Family History   Problem Relation Age of Onset    Heart disease Father     Hypertension Father     Heart disease Sister     Heart attack Sister     Heart disease Brother     Diabetes Brother     Diabetes Paternal Grandmother      Social History     Socioeconomic History    Marital status: Married     Spouse name: Not on file    Number of children: Not on file    Years of education: Not on file  Highest education level:  Bachelor's degree (e.g., BA, AB, BS)   Occupational History    Not on file   Social Needs    Financial resource strain: Somewhat hard    Food insecurity     Worry: Never true     Inability: Never true    Transportation needs     Medical: No     Non-medical: No   Tobacco Use    Smoking status: Former Smoker    Smokeless tobacco: Former Neurosurgeon    Tobacco comment: when 49 years old   Substance and Sexual Activity    Alcohol use: No    Drug use: No    Sexual activity: Not on file     Comment: Deferred   Lifestyle    Physical activity     Days per week: Not on file     Minutes per session: Not on file    Stress: Not on file   Relationships    Social connections     Talks on phone: Not on file     Gets together: Not on file     Attends religious service: Not on file     Active member of club or organization: Not on file     Attends meetings of clubs or organizations: Not on file     Relationship status: Not on file    Intimate partner violence     Fear of current or ex partner: Not on file     Emotionally abused: Not on file     Physically abused: Not on file     Forced sexual activity: Not on file   Other Topics Concern    Not on file   Social History Narrative    Not on file     Married   Non smoker - isolated smoking before age 66  Isolated drinking before age 55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 270 tablet 3    Continuous Blood Gluc Sensor (FreeStyle Libre 2 Sensor  Systm) Misc 1 Device by Does not apply route every 14 (fourteen) days 2 each 12    glucose blood test strip Check BS 5 times daily 150 each 11    insulin glargine (LANTUS) 100 UNIT/ML injection Inject 22 Units into the skin every 12 (twelve) hours Pt states that he has not been compliant in the past few days with taking his insulin secondary to weight gain, but did take it this morning. (Patient taking differently: Inject 22 Units into the skin every 12 (twelve) hours Taking consistently  ) 15 pen 3    insulin lispro, 1 Unit Dial, (HumaLOG KwikPen) 100 UNIT/ML Solution Pen-injector injection pen Inject 8 Units into the skin 3 (three) times daily before meals 9 pen 3    Insulin Pen Needle (PEN NEEDLES 3/16") 31G X 5 MM Misc 1 each by  Does not apply route 4 (four) times daily 901 each 1    Lancets Ultra Fine Misc Check BS 5 times daily 150 each 11    lisinopril (ZESTRIL) 10 MG tablet Take 10 mg by mouth daily      metFORMIN (GLUCOPHAGE) 500 MG tablet TAKE 2 TABLETS BY MOUTH ONCE DAILY IN THE MORNING WITH BREAKFAST (Patient taking differently: Taking 2 tablets in the morning and 1 in the evening.  ) 60 tablet 0    metoprolol succinate XL (Toprol XL) 50 MG 24 hr tablet Take 1.5 tablets (75 mg total) by mouth nightly 45 tablet 2    potassium chloride (KLOR-CON) 10 MEQ tablet Take 1 tablet (10 mEq total) by mouth daily dose 30 tablet 11    Semaglutide,0.25 or 0.5MG /DOS, (Ozempic, 0.25 or 0.5 MG/DOSE,) 2 MG/1.5ML Solution Pen-injector Inject 1 mg into the skin once a week 0.25mg  once a week for 4 weeks,0.5mg  once a week for 4 weeks, then 1 mg once a week 1 pen 3    spironolactone (ALDACTONE) 25 MG tablet Take 1 tablet by mouth once daily 90 tablet 3     No current facility-administered medications for this encounter.      No Known Allergies    Vitals:    Vitals:    03/01/19 1437   BP: (!) 143/94   Pulse: 84   Resp: 14   SpO2: 98%       Physical Exam:   General: overweight male, alert, NAD  HEENT:  Anicteric  sclera, supple, no significant JVD.  Lungs: diminished bases, no crackles or rails.   Cardiovascular: s1s2, normal rate, regular rhythm, no m/r/g  Abdominal: obese/distended, nontender  Extremities: warm to touch, trace edema  Neuromuscular exam: grossly non-focal, though not formally tested.    Assessment and Plan:  1. Chronic combined systolic and diastolic HF (heart failure)         Cardiomyopathy:   My impression is that, Mr. Ricarte has nonischemic cardiomyopathy, chronic systolic/diastolic heart failure ACC/AHA stage C, class III with improved EF.  Remains on guideline directed heart failure medical therapy.    Keep endocrinology appointment, make appointment with primary care.    In regards to OMM:  Beta-blocker: toprol XL  ACE-I/ARB/ARNI: yes  Aldosterone antagonist: yes  Hydralazine/nitrate: no   Digoxin: no    Medication changes:  We will give IV Bumex 2.5 mg push.    Continue with bumex 4 mg in am and 2 mg in afternoon at home.  1 month follow-up.          Electronically signed by:  Faythe Ghee, MD  [facility]  [address]  [place], [place] [address]  [phone]    [facility] - [facility]  [address]  [place], [place] [address]  [phone]

## 2019-03-11 ENCOUNTER — Encounter (INDEPENDENT_AMBULATORY_CARE_PROVIDER_SITE_OTHER): Payer: Self-pay | Admitting: Registered Nurse

## 2019-03-11 ENCOUNTER — Ambulatory Visit: Payer: 59 | Attending: Registered Nurse | Admitting: Registered Nurse

## 2019-03-11 VITALS — BP 129/92 | HR 77 | Temp 98.4°F | Resp 14 | Ht 74.02 in | Wt 329.0 lb

## 2019-03-11 DIAGNOSIS — E782 Mixed hyperlipidemia: Secondary | ICD-10-CM

## 2019-03-11 DIAGNOSIS — E1165 Type 2 diabetes mellitus with hyperglycemia: Secondary | ICD-10-CM

## 2019-03-11 DIAGNOSIS — Z794 Long term (current) use of insulin: Secondary | ICD-10-CM

## 2019-03-11 DIAGNOSIS — G629 Polyneuropathy, unspecified: Secondary | ICD-10-CM

## 2019-03-11 DIAGNOSIS — N521 Erectile dysfunction due to diseases classified elsewhere: Secondary | ICD-10-CM

## 2019-03-11 DIAGNOSIS — E1169 Type 2 diabetes mellitus with other specified complication: Secondary | ICD-10-CM

## 2019-03-11 MED ORDER — METFORMIN HCL 500 MG PO TABS
ORAL_TABLET | ORAL | 3 refills | Status: DC
Start: 2019-03-11 — End: 2020-03-08

## 2019-03-11 MED ORDER — INSULIN LISPRO (1 UNIT DIAL) 100 UNIT/ML SC SOPN
PEN_INJECTOR | SUBCUTANEOUS | 3 refills | Status: DC
Start: 2019-03-11 — End: 2019-08-23

## 2019-03-11 MED ORDER — SEMAGLUTIDE (1 MG/DOSE) 2 MG/1.5ML SC SOPN
1.00 mg | PEN_INJECTOR | SUBCUTANEOUS | 3 refills | Status: DC
Start: 2019-03-11 — End: 2019-06-09

## 2019-03-11 MED ORDER — INSULIN GLARGINE 100 UNIT/ML SC SOLN
24.00 [IU] | Freq: Two times a day (BID) | SUBCUTANEOUS | 3 refills | Status: DC
Start: 2019-03-11 — End: 2019-03-29

## 2019-03-11 NOTE — Patient Instructions (Addendum)
Increase Lantus to 24 units twice a day    Increase Humalog to 10 units plus add a sliding scale  150-200 - add 2 units  201-250 - add 4 units  251-300 - add 6 units  301-350 - add 8 units  351-400 - add 10 units    Continue Metformin 1000 mg in the morning and 500 mg in the evening    Continue Ozempic 1 mg weekly    If having hypoglycemia, remember to follow the Rule of 15.  Eat or drink 15 grams of fasting-acting carbohydrates (such as 4 glucose tablets or 4 ounces of juice or regular soda). Blood sugar should be rechecked in 15 minutes and if still low, repeat the treatment.        Send blood sugars to office in 2 weeks and will call you after reviewing them about any insulin adjustments.

## 2019-03-11 NOTE — Progress Notes (Signed)
Endocrinology Follow Up Note    Patient Name:  Samuel Koch [[id number]] DOB: [birth year]  Date: 03/11/2019    Subjective:      Samuel Koch is a 49 y.o. male here for follow up of severally  uncontrolled type 2 diabetes mellitus. Diabetes was diagnosed 2017.  Hgb A1c was 10.8% on 11/08/18, previously 8.5% on 04/14/18, 8.3% on 04/02/18, 8.2% on 03/26/18.   Hgb A1c goal based on age and comorbidities is <7%.  Complications from diabetes include neuropathy and erectile dysfunction.  He has hyperlipidemia and CHF and followed by the heart failure clinic    This was an in-office visit.      Current Diabetes Medications Include:  -Lantus 22 units BID  -Humalog 8 units TID before meals   -Metformin 1000 mg AM and 500 mg PM  - not able to tolerate 2000 mg/day  -Ozempic 1 mg injections weekly    Patient is tolerating medications well.   He ran out of metformin a few weeks ago.      He did not bring his glucose meter nor blood sugar readings today.  States his blood sugar was 168 this morning and 152 last evening.   States he is normally in the high 100s most of the time.    He has not had any low blood sugars.  He knows how to treat low blood sugars.    Was using the [employer] [facility] but not currently on.       Diet, Exercise, and Weight:     The patient tries to watch portions, starches and sweets.  He doesn't eat potatoes.  Has a lot of protein and vegetables.  He drinks coffee and water.  He does not drink surgery drinks.    He has been busy with his job and not exercising regularly.   He is very concerned about his weight.  At one time he was 350 lbs, got down to 295 lbs and is now up to 329 lbs.   He did consider weight loss surgery but he knows his insurance does not cover it as he has already checked.    Recently with CHF exacerbation and reviewed diuretics in the cardiology office.   Last saw Dr. Craig Staggers on 03/01/19.   States he is feeling better.     Diabetic Education: He did met with Font[place] No, [facility] DM program  CDE on 12/28/18.         Diabetic Complication Screening/Treatment  Eye exam:    Denies vision problems.  Has not schedule his next appt yet.      Urine microalbumin: negative;  Renal labs on 03/01/19 demonstrate Creat 1.10, BUN 24, eGFR 78.  Microalbumin/creat ratio was 5 on 04/14/18.     Foot Care: Positive for neuropathy and states he has numbness in he toes.  Does not take any medications.   He care for his feet with the help of his wife. Denies wounds or ulcers  Monofilament: partially intact Completed on 11/05/18.    Aspirin Therapy: Does not take ASA    Cardiovascular risk factors: diabetes mellitus, dyslipidemia, family history of premature cardiovascular disease, hypertension, male gender and obesity (BMI >= 30 kg/m2);  Negative for smoking.  Negative for alcohol use.   He has CHF, cardiomyopathy and is followed by Dr. Craig Staggers and heart failure clinic.   He has a pacemaker that was implanted for SSS and is followed by Dr. [name].      Statin therapy:  The patient is taking statin therapy as indicated in the medication list below.  Lipid panel on 04/14/18 demonstrates Chol 160, Trig 119, HDL 47, LDL 89.  Currently taking atorvastatin 40 mg daily and this is managed by PCP.     Ace Inhibitor/ARB: The patient is taking ACE-I/ARB at dose indicated in medication list and is tolerating this well. Currently taking atorvastatin 40 daily    Dental care: The patient has regular dental exams every 6 months.      TSH on 11/08/18 was 1.3.    He is having erectile dysfunction that started approximately 4 months ago which is very concerning to him.  He was referred to urology but has not connected with them yet.      Current Outpatient Medications   Medication Sig Dispense Refill    atorvastatin (LIPITOR) 20 MG tablet TAKE 1 TABLET BY MOUTH ONCE DAILY AT NIGHT 90 tablet 3    Blood Glucose Monitor, generic, Kit Use as directed to monitor blood sugar 1 kit 1    Blood Glucose Monitoring Suppl (OneTouch Ultra 2) w/Device  Kit USE AS DIRECTED TO MONITOR BLOOD SUGAR      bumetanide (BUMEX) 2 MG tablet Take 2 tablets (4 mg total) by mouth every morning AND 1 tablet (2 mg total) Daily after lunch. 270 tablet 3    glucose blood test strip Check BS 5 times daily 150 each 11    insulin glargine (LANTUS) 100 UNIT/ML injection Inject 24 Units into the skin every 12 (twelve) hours 45 mL 3    insulin lispro, 1 Unit Dial, (HumaLOG KwikPen) 100 UNIT/ML Solution Pen-injector injection pen 10 unit TID pre meals plus sliding scale 2:50>150 with max of 60 units per day 54 mL 3    Insulin Pen Needle (PEN NEEDLES 3/16") 31G X 5 MM Misc 1 each by Does not apply route 4 (four) times daily 901 each 1    Lancets Ultra Fine Misc Check BS 5 times daily 150 each 11    lisinopril (ZESTRIL) 10 MG tablet Take 10 mg by mouth daily      metoprolol succinate XL (Toprol XL) 50 MG 24 hr tablet Take 1.5 tablets (75 mg total) by mouth nightly 45 tablet 2    potassium chloride (KLOR-CON) 10 MEQ tablet Take 1 tablet (10 mEq total) by mouth daily dose 30 tablet 11    spironolactone (ALDACTONE) 25 MG tablet Take 1 tablet by mouth once daily 90 tablet 3    Continuous Blood Gluc Sensor (FreeStyle Libre 2 Sensor Systm) Misc 1 Device by Does not apply route every 14 (fourteen) days 2 each 12    metFORMIN (GLUCOPHAGE) 500 MG tablet 2 tablets (1000 mg) with breakfast and 1 tablets (500 mg) with dinner 270 tablet 3    Semaglutide, 1 MG/DOSE, 2 MG/1.5ML Solution Pen-injector Inject 1 mg into the skin once a week 9 mL 3     No current facility-administered medications for this visit.       Medication review with Patricia Nettle Hollingsworth suggested compliance most of the time.       There is no immunization history on file for this patient.  The following portions of the patient's history were reviewed and updated as appropriate: allergies, current medications, past family history, past medical history, past social history, past surgical history and problem list.  The  following information was also reviewed at today's visit: office notes from referring provider, hospital records and lab data    Review of  Systems  Review of Systems   Constitutional: Negative for malaise/fatigue and weight loss.   HENT: Negative for congestion and sore throat.    Eyes: Negative for blurred vision.   Respiratory: Negative for cough and shortness of breath.    Cardiovascular: Negative for chest pain, palpitations and leg swelling.   Gastrointestinal: Negative for abdominal pain, constipation, diarrhea, nausea and vomiting.   Genitourinary: Negative for dysuria, frequency and hematuria.   Musculoskeletal: Negative for back pain, joint pain and myalgias.   Skin: Negative for rash.   Neurological: Positive for tingling (feet). Negative for tremors, weakness and headaches.   Endo/Heme/Allergies: Negative for polydipsia.   Psychiatric/Behavioral: Negative for depression. The patient is not nervous/anxious and does not have insomnia.         Objective:      Vital Signs: BP (!) 129/92    Pulse 77    Temp 98.4 F (36.9 C) (Temporal)    Resp 14    Ht 1.88 m (6' 2.02")    Wt 149.2 kg (329 lb 0.6 oz)    BMI 42.23 kg/m   Physical Exam   Constitutional: He is oriented to person, place, and time and well-developed, well-nourished, and in no distress. No distress.   HENT:   Head: Normocephalic.   Eyes: Pupils are equal, round, and reactive to light. Conjunctivae are normal.   Neck: Normal range of motion. Neck supple. No thyromegaly present.   Cardiovascular: Normal rate, regular rhythm, S1 normal, S2 normal, normal heart sounds and intact distal pulses.   Pulses:       Radial pulses are 2+ on the right side and 2+ on the left side.   Pulmonary/Chest: Effort normal and breath sounds normal.   Musculoskeletal: Normal range of motion.   Lymphadenopathy:     He has no cervical adenopathy.   Neurological: He is alert and oriented to person, place, and time.        Skin: Skin is warm and dry.   Psychiatric: Mood,  memory, affect and judgment normal.   Nursing note and vitals reviewed.       Lab Review    Lab Results   Component Value Date    HGBA1CPERCNT 8.2 03/26/2018    HGBA1CPERCNT 10.7 10/06/2017    HGBA1C 10.8 (H) 11/05/2018    TSH 1.300 11/05/2018    CHOL 201 (H) 03/26/2018    HDL 47 03/26/2018    LDL 108 03/26/2018    TRIG 228 (H) 03/26/2018    ALT 24 12/26/2018    GLU 166 (H) 01/08/2019    K 4.2 01/08/2019    CA 8.7 01/08/2019    CREAT 1.10 03/01/2019    CO2 25 01/08/2019    EGFR 78 03/01/2019    NA 137 01/08/2019    ALKPHOS 67 12/26/2018          Impression and Recommendations:    1. Uncontrolled type 2 diabetes mellitus with hyperglycemia  - Hemoglobin A1C; Future  - Lipid panel; Future  - Microalbumin / Creatinine Ratio; Future    2. Long term current use of insulin    3. Neuropathy    4. Mixed hyperlipidemia  - Lipid panel; Future    5. Erectile dysfunction associated with type 2 diabetes mellitus    -Increase Lantus to 24 units BID  -Increase  Humalog to 10 units TID before meals plus add correction of 2:50>150  -Continue Metformin to 1000 qAM and 500 mg qPM  -Continue Ozempic 1 mg weekly injections    -  Asked to check blood sugars 4-5 times a day and record and bring log sheet and send to the office in 2 weeks.  Will evaluate and adjust insulin  - A referral had been made to podiatry to evaluate right great toe but he did not schedule an appt yet.   -Reviewed signs and symptoms of hypoglycemia and how to treat; Rule of 15  -Reviewed the importance of proper foot care and need to inspect feet daily  -Does have neuropathy; not on gabapentin  -No renal dysfunction nor microalbuminuria ;  Not on ACEI/ARB   -Does have hyperlipidemia and on a statin and managed by PCP  -Does have HTN and managed by PCP.  BP today was 129/92;  Discussed that this is not in goal;  Advised to check BP at home and record and discuss with PCP  -Reviewed diabetic diet, importance of exercise and water intake  -Advised to walk 30 mins 5 days  a week and after meals.   -Labs as above prior to next appt.     Follow up in 3 month or sooner if needed     Counseling:    40 minutes spent with patient face to face, greater than 50% of the office visit was dedicated to counseling and coordination of care, reviewing tests & labs, treatment options and current plan, and follow up plans.    - Discussed with patient importance of always bringing glucometer and log book to all visits  - Discussed about heart healthy diabetic diet and how this impacts diabetes management  - Discussed about importance of exercise and walking 30 mins 5 days a week (150 mins/week)  - Discussed importance of medication and diet compliance  - Keep up to date with annual eye exams and annual podiatry visits  - Advised to call the office if any issues with glucose management such as any hypoglycemic episodes with glucose <70 and/or persistently elevated glucose levels >200 over the next few days  - Discussed signs and symptoms of hypoglycemia and hyperglycemia and patient is aware on how to treat them.     Jimmy Picket, FNP  Electronically Signed 03/11/2019

## 2019-03-29 ENCOUNTER — Encounter: Payer: Self-pay | Admitting: Internal Medicine

## 2019-03-29 ENCOUNTER — Ambulatory Visit
Admission: RE | Admit: 2019-03-29 | Discharge: 2019-03-29 | Disposition: A | Discharge status: Home / Self Care | Payer: 59 | Source: Ambulatory Visit | Attending: Family | Admitting: Family

## 2019-03-29 ENCOUNTER — Other Ambulatory Visit (INDEPENDENT_AMBULATORY_CARE_PROVIDER_SITE_OTHER): Payer: Self-pay | Admitting: Registered Nurse

## 2019-03-29 VITALS — BP 134/77 | HR 80 | Resp 18 | Wt 331.0 lb

## 2019-03-29 DIAGNOSIS — I10 Essential (primary) hypertension: Secondary | ICD-10-CM

## 2019-03-29 DIAGNOSIS — E785 Hyperlipidemia, unspecified: Secondary | ICD-10-CM

## 2019-03-29 DIAGNOSIS — I4729 Other ventricular tachycardia: Secondary | ICD-10-CM

## 2019-03-29 DIAGNOSIS — I110 Hypertensive heart disease with heart failure: Secondary | ICD-10-CM | POA: Insufficient documentation

## 2019-03-29 DIAGNOSIS — I428 Other cardiomyopathies: Secondary | ICD-10-CM | POA: Insufficient documentation

## 2019-03-29 DIAGNOSIS — I5042 Chronic combined systolic (congestive) and diastolic (congestive) heart failure: Secondary | ICD-10-CM | POA: Insufficient documentation

## 2019-03-29 DIAGNOSIS — R739 Hyperglycemia, unspecified: Secondary | ICD-10-CM

## 2019-03-29 DIAGNOSIS — R0609 Other forms of dyspnea: Secondary | ICD-10-CM

## 2019-03-29 LAB — I-STAT CHEM 8 CARTRIDGE
Anion Gap I-Stat: 16 (ref 7.0–16.0)
BUN I-Stat: 23 mg/dL — ABNORMAL HIGH (ref 7–22)
Calcium Ionized I-Stat: 4.6 mg/dL (ref 4.35–5.10)
Chloride I-Stat: 100 mMol/L (ref 98–110)
Creatinine I-Stat: 1.1 mg/dL (ref 0.80–1.30)
EGFR: 78 mL/min/{1.73_m2} (ref 60–150)
Glucose I-Stat: 141 mg/dL — ABNORMAL HIGH (ref 71–99)
Hematocrit I-Stat: 45 % (ref 39.0–52.5)
Hemoglobin I-Stat: 15.3 gm/dL (ref 13.0–17.5)
Potassium I-Stat: 3.6 mMol/L (ref 3.5–5.3)
Sodium I-Stat: 140 mMol/L (ref 136–147)
TCO2 I-Stat: 29 mMol/L (ref 24–29)

## 2019-03-29 MED ORDER — SPIRONOLACTONE 25 MG PO TABS
50.0000 mg | ORAL_TABLET | Freq: Every day | ORAL | 3 refills | Status: DC
Start: 2019-03-29 — End: 2019-05-24

## 2019-03-29 MED ORDER — VH POTASSIUM CHLORIDE CRYS ER 20 MEQ PO TBCR (WRAP)
40.00 meq | EXTENDED_RELEASE_TABLET | Freq: Once | ORAL | Status: AC
Start: 2019-03-29 — End: 2019-03-29
  Administered 2019-03-29: 16:00:00 40 meq via ORAL
  Filled 2019-03-29: qty 2

## 2019-03-29 MED ORDER — INSULIN GLARGINE 100 UNIT/ML SC SOLN
24.00 [IU] | Freq: Two times a day (BID) | SUBCUTANEOUS | 3 refills | Status: DC
Start: 2019-03-29 — End: 2019-08-23

## 2019-03-29 MED ORDER — BUMETANIDE 0.25 MG/ML IJ SOLN
2.50 mg | Freq: Once | INTRAMUSCULAR | Status: AC
Start: 2019-03-29 — End: 2019-03-29
  Administered 2019-03-29: 2.5 mg via INTRAVENOUS
  Filled 2019-03-29: qty 10

## 2019-03-29 NOTE — Patient Instructions (Signed)
Increase Spironolactone 50mg  daily.  IV Bumex 2.5mg  today  1 month return.

## 2019-03-29 NOTE — Progress Notes (Signed)
[facility]    I had the pleasure of seeing Mr. Patricia Nettle Moroni today at [facility] for an advanced heart failure follow up.  Follows with Dr. [name] for cardiology.    Ebony Rickel is a 49 y.o. male with nonischemic cardiomyopathy, chronic combined systolic and diastolic heart failure, latest EF 50-55%, NYHA Class 3, ACC/AHA Stage C.  Has dual-chamber pacemaker in place.  Status post PVC ablation.  Also uncontrolled type II diabetic, obesity.       He has been requiring IV diuretic pushes and up titration of oral diuretics at home.    At last visit, was volume overloaded.  Offered IV Bumex 2.5 mg push and continued with home Bumex 4 mg morning 2 mg in the afternoon.    Since then, he saw an endocrinology, insulin was adjusted, he finds that his glucose levels are better at home.  Overall his diabetes is better controlled.  Today, doing okay, exercising regularly treadmill 20 to 30 minutes at a time sometimes even more, he can endorses greater than 6 hours of exercise per week.  Occasionally gets some left-sided chest pain which is short-lived and typically at rest.  Does not have anginal components.  Also patient gets some cramping in his legs.  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  Endorses that he does eat more poorly with high salt foods when he is on the road.      I-STAT lab work today  K 3.6, Cr 1.1, CO2 29.      Denies any dizziness or lightheadedness or syncope.  No orthopnea or PND.  No significant worsening of shortness of breath.  No hospitalizations.        Review of Systems       Comprehensive review of systems performed by me. Unless otherwise noted, all systems are negative.      Most recent laboratory testing reveals:   LABORATORY DATA:  Lab Results   Component Value Date    BUN 16 01/08/2019    NA 137 01/08/2019    K 4.2 01/08/2019    CL 105 01/08/2019    CO2 25  01/08/2019     Lab Results   Component Value Date    WBC 4.4 01/08/2019    HGB 13.2 01/08/2019    HCT 39.5 01/08/2019    MCV 92 01/08/2019    PLT 207 01/08/2019     Results     Procedure Component Value Units Date/Time    i-Stat Chem 8 CartrIDge [[id number]]  (Abnormal) Collected: 03/29/19 1542    Specimen: Blood Updated: 03/29/19 1600     Sodium I-Stat 140 mMol/L      Potassium I-Stat 3.6 mMol/L      Chloride I-Stat 100 mMol/L      TCO2 I-Stat 29 mMol/L      Calcium Ionized I-Stat 4.60 mg/dL      Glucose I-Stat 409 mg/dL      Creatinine I-Stat 1.10 mg/dL      BUN I-Stat 23 mg/dL      Anion Gap I-Stat 81.1     EGFR 78 mL/min/1.78m2      Hematocrit I-Stat 45.0 %      Hemoglobin I-Stat 15.3 gm/dL             Medical Problems:  Patient Active Problem List    Diagnosis Date Noted  Adjustment and management of cardiac pacemaker 07/08/2018    Class 2 severe obesity with serious comorbidity and body mass index (BMI) of 39.0 to 39.9 in adult, unspecified obesity type 07/08/2018    SSS (sick sinus syndrome) 07/08/2018    PVC (premature ventricular contraction) 07/08/2018    Chest pain 03/26/2018    NICM (nonischemic cardiomyopathy) 10/20/2017    Chronic combined systolic and diastolic HF (heart failure) 10/20/2017    Obesity, morbid, BMI 40.0-49.9 10/07/2017    DOE (dyspnea on exertion) 10/06/2017    Hyperglycemia 03/29/2017    Essential hypertension 03/29/2017    Renal insufficiency 03/29/2017    NSVT (nonsustained ventricular tachycardia) 08/17/2015    DM type 2 with diabetic dyslipidemia 07/31/2015       History from Cardiology at [facility] [place]  1. Chronic combined systolic and diastolic CHF    2. Nonischemic cardiomyopathy  A. LHC (6/17): 30% mid RCA, no other significant disease.   B. Echo (6/17): EF 30-35%.   C. Holter (7/17): 19% PVCs.   D. Echo (3/18): EF 50-55%, moderate LVH, grade II diastolic dysfunction.   E. Echo (9/18): EF 50-55%  F. Echo 10/07/17- LV cavity size is increased. The LVEF is est. at  50%. Mild hypokinesis of the distal septum and apex which may be due to paced rhythm. Pacer wire present in RV.   G. Echo 06/12/2018- Mild concentric LVH, EF 50-55%, aortic root is mildy dilated, aortic root is 3.9cm, tricuspid regurgitation is mild, RSVP .     3. Type II diabetes    4. Hypertriglyceridemia    5. PVCs  A. 48 hour holter (7/17) with 19% PVCs.  B. PVC ablation in 8/17 but PVCs recurred. Will   C. Holter (11/17) with rare PVCs, < 1%.   D. Holter (9/18) with rare PVCs only    6. Bradycardia/ SSS  A. Dual chamber pacemaker placed (Medtronic, MRI compatible).     7. NSVT    8. CAD evaluation   A. Echo 03/27/2018- EF 50%, no regional wall motion abnormalities present, grade 2 DD.  no significant valvular heart disease.   B. Stress 03/28/2018- Completed vasodilator protocol., No ischemic changes with infusion, No arrhythmias,low risk. Perfusion defect noted thought to be diaphragmatic attenuation  Dilated LV with mildly decreased left ventricular function, stress EF 44%.   C. THC 03/30/2018- Non obstructive cad.  CO 5 CI 2.0 wedge 14 mean PA 22    Past Medical History:   Diagnosis Date    Cardiomyopathy     Congestive heart failure     Diabetes mellitus     Hypertension     PVC's (premature ventricular contractions)      Past Surgical History:   Procedure Laterality Date    APPENDECTOMY      CARDIAC PACEMAKER PLACEMENT      RIGHT & LEFT HEART CATH  W/ CORONARY ANGIOS, LV/LA Right 03/30/2018    Procedure: RIGHT & LEFT HEART CATH  W/ CORONARY ANGIOS, LV/LA;  Surgeon: Santo Held, MD;  Location: [facility] CATH/EP;  Service: Cardiovascular;  Laterality: Right;     Family History   Problem Relation Age of Onset    Heart disease Father     Hypertension Father     Heart disease Sister     Heart attack Sister     Heart disease Brother     Diabetes Brother     Diabetes Paternal Grandmother      Social History     Socioeconomic  History    Marital status: Married     Spouse name: Not on file    Number of  children: Not on file    Years of education: Not on file    Highest education level: Bachelor's degree (e.g., BA, AB, BS)   Occupational History    Not on file   Social Needs    Financial resource strain: Somewhat hard    Food insecurity     Worry: Never true     Inability: Never true    Transportation needs     Medical: No     Non-medical: No   Tobacco Use    Smoking status: Former Smoker    Smokeless tobacco: Former Neurosurgeon    Tobacco comment: when 49 years old   Substance and Sexual Activity    Alcohol use: No    Drug use: No    Sexual activity: Not on file     Comment: Deferred   Lifestyle    Physical activity     Days per week: Not on file     Minutes per session: Not on file    Stress: Not on file   Relationships    Social connections     Talks on phone: Not on file     Gets together: Not on file     Attends religious service: Not on file     Active member of club or organization: Not on file     Attends meetings of clubs or organizations: Not on file     Relationship status: Not on file    Intimate partner violence     Fear of current or ex partner: Not on file     Emotionally abused: Not on file     Physically abused: Not on file     Forced sexual activity: Not on file   Other Topics Concern    Not on file   Social History Narrative    Not on file     Married   Non smoker - isolated smoking before age 88  Isolated drinking before age 62 - 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 270 tablet 3    Continuous Blood Gluc Sensor (FreeStyle Libre 2 Sensor Systm) Misc 1 Device by Does not apply route every 14 (fourteen) days 2 each 12    glucose blood test strip Check BS 5 times daily 150 each 11    insulin glargine (LANTUS) 100 UNIT/ML injection Inject 24 Units into the skin every 12 (twelve) hours 45 mL 3    insulin lispro, 1 Unit Dial, (HumaLOG KwikPen) 100 UNIT/ML Solution Pen-injector injection pen 10 unit TID pre meals plus sliding scale 2:50>150 with max of 60 units per day 54 mL 3    Insulin Pen Needle (PEN NEEDLES 3/16") 31G X 5 MM Misc 1 each by Does not apply  route 4 (four) times daily 901 each 1    Lancets Ultra Fine Misc Check BS 5 times daily 150 each 11    lisinopril (ZESTRIL) 10 MG tablet Take 10 mg by mouth daily      metFORMIN (GLUCOPHAGE) 500 MG tablet 2 tablets (1000 mg) with breakfast and 1 tablets (500 mg) with dinner 270 tablet 3    metoprolol succinate XL (Toprol XL) 50 MG 24 hr tablet Take 1.5 tablets (75 mg total) by mouth nightly 45 tablet 2    potassium chloride (KLOR-CON) 10 MEQ tablet Take 1 tablet (10 mEq total) by mouth daily dose 30 tablet 11    Semaglutide, 1 MG/DOSE, 2 MG/1.5ML Solution Pen-injector Inject 1 mg into the skin once a week 9 mL 3    spironolactone (ALDACTONE) 25 MG tablet Take 2 tablets (50 mg total) by mouth daily 90 tablet 3     No current facility-administered medications for this encounter.      No Known Allergies    Vitals:    Vitals:    03/29/19 1526   BP: 134/77   Pulse: 80   Resp: 18   SpO2: 97%       Physical Exam:   General: overweight male, alert, NAD  HEENT:  Anicteric sclera, supple  Lungs: diminished bases, no crackles or rails.   Cardiovascular: Mild JVD, s1s2, normal rate, regular rhythm, no m/r/g  Abdominal: obese/distended, nontender  Extremities: warm to touch, trace edema  Neuromuscular exam: grossly non-focal, though not formally tested.    Assessment and Plan:  1. Hyperglycemia     2. Chronic combined  systolic and diastolic HF (heart failure)     3. Essential hypertension     4. DOE (dyspnea on exertion)     5. NICM (nonischemic cardiomyopathy)     6. NSVT (nonsustained ventricular tachycardia)     7. DM type 2 with diabetic dyslipidemia         Cardiomyopathy:   My impression is that, Mr. Lupton has nonischemic cardiomyopathy, chronic systolic/diastolic heart failure ACC/AHA stage C, class III with improved EF.  Remains on guideline directed heart failure medical therapy.    In regards to OMM:  Beta-blocker: toprol XL  ACE-I/ARB/ARNI: yes  Aldosterone antagonist: yes  Hydralazine/nitrate: no   Digoxin: no    Medication changes:  Increase Spironolactone 50mg  daily.  IV Bumex 2.5mg  today, + K 40.  1 month return.        Electronically signed by:  Faythe Ghee, MD  [facility]  [address]  [place], [place] [address]  [phone]    [facility] - [facility]  [address]  [place], [place] [address]  [phone]

## 2019-04-19 ENCOUNTER — Ambulatory Visit
Admission: RE | Admit: 2019-04-19 | Discharge: 2019-04-19 | Disposition: A | Discharge status: Home / Self Care | Payer: 59 | Source: Ambulatory Visit | Attending: Family | Admitting: Family

## 2019-04-19 ENCOUNTER — Encounter: Payer: Self-pay | Admitting: Internal Medicine

## 2019-04-19 ENCOUNTER — Ambulatory Visit: Payer: 59

## 2019-04-19 VITALS — BP 121/96 | HR 85 | Resp 18 | Wt 331.0 lb

## 2019-04-19 DIAGNOSIS — I495 Sick sinus syndrome: Secondary | ICD-10-CM

## 2019-04-19 DIAGNOSIS — I110 Hypertensive heart disease with heart failure: Secondary | ICD-10-CM | POA: Insufficient documentation

## 2019-04-19 DIAGNOSIS — I429 Cardiomyopathy, unspecified: Secondary | ICD-10-CM

## 2019-04-19 DIAGNOSIS — I493 Ventricular premature depolarization: Secondary | ICD-10-CM

## 2019-04-19 DIAGNOSIS — R0609 Other forms of dyspnea: Secondary | ICD-10-CM

## 2019-04-19 DIAGNOSIS — I428 Other cardiomyopathies: Secondary | ICD-10-CM | POA: Insufficient documentation

## 2019-04-19 DIAGNOSIS — R0600 Dyspnea, unspecified: Secondary | ICD-10-CM

## 2019-04-19 DIAGNOSIS — R002 Palpitations: Secondary | ICD-10-CM

## 2019-04-19 DIAGNOSIS — I472 Ventricular tachycardia: Secondary | ICD-10-CM

## 2019-04-19 DIAGNOSIS — N289 Disorder of kidney and ureter, unspecified: Secondary | ICD-10-CM

## 2019-04-19 DIAGNOSIS — I10 Essential (primary) hypertension: Secondary | ICD-10-CM

## 2019-04-19 DIAGNOSIS — I4729 Other ventricular tachycardia: Secondary | ICD-10-CM

## 2019-04-19 DIAGNOSIS — I5042 Chronic combined systolic (congestive) and diastolic (congestive) heart failure: Secondary | ICD-10-CM

## 2019-04-19 LAB — I-STAT CHEM 8 CARTRIDGE
Anion Gap I-Stat: 13 (ref 7.0–16.0)
BUN I-Stat: 22 mg/dL (ref 7–22)
Calcium Ionized I-Stat: 5 mg/dL (ref 4.35–5.10)
Chloride I-Stat: 103 mMol/L (ref 98–110)
Creatinine I-Stat: 1.1 mg/dL (ref 0.80–1.30)
EGFR: 78 mL/min/{1.73_m2} (ref 60–150)
Glucose I-Stat: 139 mg/dL — ABNORMAL HIGH (ref 71–99)
Hematocrit I-Stat: 46 % (ref 39.0–52.5)
Hemoglobin I-Stat: 15.6 gm/dL (ref 13.0–17.5)
Potassium I-Stat: 3.8 mMol/L (ref 3.5–5.3)
Sodium I-Stat: 141 mMol/L (ref 136–147)
TCO2 I-Stat: 30 mMol/L — ABNORMAL HIGH (ref 24–29)

## 2019-04-19 MED ORDER — VH POTASSIUM CHLORIDE CRYS ER 20 MEQ PO TBCR (WRAP)
20.00 meq | EXTENDED_RELEASE_TABLET | Freq: Once | ORAL | Status: AC
Start: 2019-04-19 — End: 2019-04-19
  Administered 2019-04-19: 16:00:00 20 meq via ORAL
  Filled 2019-04-19: qty 1

## 2019-04-19 MED ORDER — BUMETANIDE 0.25 MG/ML IJ SOLN
2.50 mg | Freq: Once | INTRAMUSCULAR | Status: AC
Start: 2019-04-19 — End: 2019-04-19
  Administered 2019-04-19: 2.5 mg via INTRAVENOUS
  Filled 2019-04-19: qty 10

## 2019-04-19 NOTE — Patient Instructions (Addendum)
Device interogation  IV Bumex 2.5 mg  Oral K  Same Bumex dose at home, but double afternoon dose when needed.  1 month return.

## 2019-04-19 NOTE — Progress Notes (Signed)
[facility]    I had the pleasure of seeing [name] today at [facility] for an advanced heart failure follow up.  Follows with Dr. [name] for cardiology.    Samuel Koch is a 49 y.o. male with nonischemic cardiomyopathy, chronic combined systolic and diastolic heart failure, latest EF 50-55%, NYHA Class 3, ACC/AHA Stage C.  Has dual-chamber pacemaker in place.  Status post PVC ablation.  Also uncontrolled type II diabetic, obesity.       He has been requiring IV diuretic pushes and up titration of oral diuretics at home.    Required IV Bumex on last visit, and potassium supplement.  Also increase prolactin to 50 mg daily.  Is been following endocrinology, adjusting insulin as needed.  Can exercise more at home.    Since last visit, took a trip to [place], over the last 2 days believes he is gained about 10 pounds of fluid he tends to have some diet indiscretion with salt when he travels.    Notably, he had 2 syncopal events since last visit, the first time was when he was laughing too hard and lost consciousness and fell in the chair he was sitting in.  No seizure-like activity noted as per patient.  Most recent syncopal event last Thursday, standing up from a sitting position and felt dizzy and lost consciousness.Been feeling a bit foggy lately, lack of focus and more tired.  Some palpitations.  He wonders if he is working too hard.    Currently, has orthopnea cannot lay flat down, sleeps in an inclined bed.  Get about 10 pounds over the last couple days due to his recent trip.    On i-STAT labs today, potassium 3.8, bicarb 30, creatinine 1.1.        Review of Systems       Comprehensive review of systems performed by me. Unless otherwise noted, all systems are negative.      Most recent laboratory testing reveals:   LABORATORY DATA:  Lab Results   Component Value Date    BUN 16 01/08/2019    NA 137 01/08/2019    K 4.2 01/08/2019     CL 105 01/08/2019    CO2 25 01/08/2019     Lab Results   Component Value Date    WBC 4.4 01/08/2019    HGB 13.2 01/08/2019    HCT 39.5 01/08/2019    MCV 92 01/08/2019    PLT 207 01/08/2019     Results     Procedure Component Value Units Date/Time    i-Stat Chem 8 CartrIDge [[id number]]  (Abnormal) Collected: 04/19/19 1515    Specimen: Blood Updated: 04/19/19 1522     Sodium I-Stat 141 mMol/L      Potassium I-Stat 3.8 mMol/L      Chloride I-Stat 103 mMol/L      TCO2 I-Stat 30 mMol/L      Calcium Ionized I-Stat 5.00 mg/dL      Glucose I-Stat 962 mg/dL      Creatinine I-Stat 1.10 mg/dL      BUN I-Stat 22 mg/dL      Anion Gap I-Stat 95.2     EGFR 78 mL/min/1.4m2      Hematocrit I-Stat 46.0 %      Hemoglobin I-Stat 15.6 gm/dL             Medical Problems:  Patient Active Problem List    Diagnosis Date Noted  Adjustment and management of cardiac pacemaker 07/08/2018    Class 2 severe obesity with serious comorbidity and body mass index (BMI) of 39.0 to 39.9 in adult, unspecified obesity type 07/08/2018    SSS (sick sinus syndrome) 07/08/2018    PVC (premature ventricular contraction) 07/08/2018    Chest pain 03/26/2018    NICM (nonischemic cardiomyopathy) 10/20/2017    Chronic combined systolic and diastolic HF (heart failure) 10/20/2017    Obesity, morbid, BMI 40.0-49.9 10/07/2017    DOE (dyspnea on exertion) 10/06/2017    Hyperglycemia 03/29/2017    Essential hypertension 03/29/2017    Renal insufficiency 03/29/2017    NSVT (nonsustained ventricular tachycardia) 08/17/2015    DM type 2 with diabetic dyslipidemia 07/31/2015       History from Cardiology at [facility] [place]  1. Chronic combined systolic and diastolic CHF    2. Nonischemic cardiomyopathy  A. LHC (6/17): 30% mid RCA, no other significant disease.   B. Echo (6/17): EF 30-35%.   C. Holter (7/17): 19% PVCs.   D. Echo (3/18): EF 50-55%, moderate LVH, grade II diastolic dysfunction.   E. Echo (9/18): EF 50-55%  F. Echo 10/07/17- LV cavity size is  increased. The LVEF is est. at 50%. Mild hypokinesis of the distal septum and apex which may be due to paced rhythm. Pacer wire present in RV.   G. Echo 06/12/2018- Mild concentric LVH, EF 50-55%, aortic root is mildy dilated, aortic root is 3.9cm, tricuspid regurgitation is mild, RSVP .     3. Type II diabetes    4. Hypertriglyceridemia    5. PVCs  A. 48 hour holter (7/17) with 19% PVCs.  B. PVC ablation in 8/17 but PVCs recurred. Will   C. Holter (11/17) with rare PVCs, < 1%.   D. Holter (9/18) with rare PVCs only    6. Bradycardia/ SSS  A. Dual chamber pacemaker placed (Medtronic, MRI compatible).     7. NSVT    8. CAD evaluation   A. Echo 03/27/2018- EF 50%, no regional wall motion abnormalities present, grade 2 DD.  no significant valvular heart disease.   B. Stress 03/28/2018- Completed vasodilator protocol., No ischemic changes with infusion, No arrhythmias,low risk. Perfusion defect noted thought to be diaphragmatic attenuation  Dilated LV with mildly decreased left ventricular function, stress EF 44%.   C. THC 03/30/2018- Non obstructive cad.  CO 5 CI 2.0 wedge 14 mean PA 22    Past Medical History:   Diagnosis Date    Cardiomyopathy     Congestive heart failure     Diabetes mellitus     Hypertension     PVC's (premature ventricular contractions)      Past Surgical History:   Procedure Laterality Date    APPENDECTOMY      CARDIAC PACEMAKER PLACEMENT      RIGHT & LEFT HEART CATH  W/ CORONARY ANGIOS, LV/LA Right 03/30/2018    Procedure: RIGHT & LEFT HEART CATH  W/ CORONARY ANGIOS, LV/LA;  Surgeon: Santo Held, MD;  Location: [facility] CATH/EP;  Service: Cardiovascular;  Laterality: Right;     Family History   Problem Relation Age of Onset    Heart disease Father     Hypertension Father     Heart disease Sister     Heart attack Sister     Heart disease Brother     Diabetes Brother     Diabetes Paternal Grandmother      Social History     Socioeconomic  History    Marital status: Married     Spouse  name: Not on file    Number of children: Not on file    Years of education: Not on file    Highest education level: Bachelor's degree (e.g., BA, AB, BS)   Occupational History    Not on file   Social Needs    Financial resource strain: Somewhat hard    Food insecurity     Worry: Never true     Inability: Never true    Transportation needs     Medical: No     Non-medical: No   Tobacco Use    Smoking status: Former Smoker    Smokeless tobacco: Former Neurosurgeon    Tobacco comment: when 49 years old   Substance and Sexual Activity    Alcohol use: No    Drug use: No    Sexual activity: Not on file     Comment: Deferred   Lifestyle    Physical activity     Days per week: Not on file     Minutes per session: Not on file    Stress: Not on file   Relationships    Social connections     Talks on phone: Not on file     Gets together: Not on file     Attends religious service: Not on file     Active member of club or organization: Not on file     Attends meetings of clubs or organizations: Not on file     Relationship status: Not on file    Intimate partner violence     Fear of current or ex partner: Not on file     Emotionally abused: Not on file     Physically abused: Not on file     Forced sexual activity: Not on file   Other Topics Concern    Not on file   Social History Narrative    Not on file     Married   Non smoker - isolated smoking before age 49  Isolated drinking before age 87 - 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 270 tablet 3    Continuous Blood Gluc Sensor (FreeStyle Libre 2 Sensor Systm) Misc 1 Device by Does not apply route every 14 (fourteen) days 2 each 12    glucose blood test strip Check BS 5 times daily 150 each 11    insulin glargine (LANTUS) 100 UNIT/ML injection Inject 24 Units into the skin every 12 (twelve) hours 45 mL 3    insulin lispro, 1 Unit Dial, (HumaLOG KwikPen) 100 UNIT/ML Solution Pen-injector injection pen 10 unit TID pre meals plus sliding scale 2:50>150 with max of 60 units per day 54 mL 3    Insulin Pen Needle (PEN NEEDLES 3/16") 31G X 5 MM Misc 1 each by Does not apply  route 4 (four) times daily 901 each 1    Lancets Ultra Fine Misc Check BS 5 times daily 150 each 11    lisinopril (ZESTRIL) 10 MG tablet Take 10 mg by mouth daily      metFORMIN (GLUCOPHAGE) 500 MG tablet 2 tablets (1000 mg) with breakfast and 1 tablets (500 mg) with dinner 270 tablet 3    metoprolol succinate XL (Toprol XL) 50 MG 24 hr tablet Take 1.5 tablets (75 mg total) by mouth nightly 45 tablet 2    potassium chloride (KLOR-CON) 10 MEQ tablet Take 1 tablet (10 mEq total) by mouth daily dose 30 tablet 11    Semaglutide, 1 MG/DOSE, 2 MG/1.5ML Solution Pen-injector Inject 1 mg into the skin once a week 9 mL 3    spironolactone (ALDACTONE) 25 MG tablet Take 2 tablets (50 mg total) by mouth daily 90 tablet 3     No current facility-administered medications for this encounter.      No Known Allergies    Vitals:    Vitals:    04/19/19 1504   BP: (!) 121/96   Pulse: 85   Resp: 18   SpO2: 98%       Physical Exam:   General: overweight male, alert, NAD  HEENT:  Anicteric sclera, supple  Lungs: diminished bases, no crackles or rails.   Cardiovascular: Mild JVD, s1s2, normal rate, regular rhythm, no m/r/g  Abdominal: obese/distended, nontender  Extremities: warm to touch, trace edema  Neuromuscular exam: grossly non-focal, though not formally tested.    Assessment and Plan:  1.  Essential hypertension     2. Chronic combined systolic and diastolic HF (heart failure)     3. Renal insufficiency     4. DOE (dyspnea on exertion)     5. Obesity, morbid, BMI 40.0-49.9     6. NICM (nonischemic cardiomyopathy)     7. PVC (premature ventricular contraction)     8. NSVT (nonsustained ventricular tachycardia)     9. SSS (sick sinus syndrome)         Cardiomyopathy:   My impression is that, Mr. Mueth has nonischemic cardiomyopathy, chronic systolic/diastolic heart failure ACC/AHA stage C, class III with improved EF.  Did have excessive PVC status post ablation in the past.  Remains on guideline directed heart failure medical therapy.    Concerning for 2 episodes syncope.  May be having substantial ventricular ectopy again.  No recent interrogation of his device.      In regards to OMM:  Beta-blocker: toprol XL  ACE-I/ARB/ARNI: yes  Aldosterone antagonist: yes  Hydralazine/nitrate: no   Digoxin: no    Medication changes:  Device interogation  IV Bumex 2.5 mg today  Oral K  Same Bumex dose at home, but double afternoon dose when needed.  1 month return.        Electronically signed by:  Faythe Ghee, MD  [facility]  [address], [address]  [place], [place] [address]  [phone]    [facility] - [facility]  [address]  [place], [place] [address]  [phone]

## 2019-04-20 NOTE — Progress Notes (Signed)
Okay I will forward onto [facility].     Hilary please see above.     Thank you!Rosalita Chessman, RN

## 2019-04-20 NOTE — Progress Notes (Signed)
Well he sent him from the HF clinic for me to check yesterday. I just wanted to fill him in this morning.

## 2019-04-20 NOTE — Progress Notes (Signed)
Was Dr. Mellody Drown asking for this specifically? This patient follows Dr. Mellody Drown in the [facility] only not our office. He follows Misty Stanley and Dr. [name] here.     Thank you!Rosalita Chessman, RN

## 2019-04-21 ENCOUNTER — Telehealth (INDEPENDENT_AMBULATORY_CARE_PROVIDER_SITE_OTHER): Payer: Self-pay

## 2019-04-21 NOTE — Telephone Encounter (Signed)
Pt's wife called and reported that he has been feeling nauseated and "full". She wanted to stop the ozempic to see if his symptoms would improve. I discussed with Thurston Hole and advised her to stop the ozempic and call back in a week to update [place] with symptoms.

## 2019-05-17 ENCOUNTER — Encounter (INDEPENDENT_AMBULATORY_CARE_PROVIDER_SITE_OTHER): Payer: Self-pay

## 2019-05-24 ENCOUNTER — Encounter: Payer: Self-pay | Admitting: Internal Medicine

## 2019-05-24 ENCOUNTER — Ambulatory Visit
Admission: RE | Admit: 2019-05-24 | Discharge: 2019-05-24 | Disposition: A | Discharge status: Home / Self Care | Payer: 59 | Source: Ambulatory Visit | Attending: Family | Admitting: Family

## 2019-05-24 ENCOUNTER — Telehealth: Payer: Self-pay

## 2019-05-24 VITALS — BP 140/82 | HR 96 | Resp 18 | Wt 338.2 lb

## 2019-05-24 DIAGNOSIS — I5042 Chronic combined systolic (congestive) and diastolic (congestive) heart failure: Secondary | ICD-10-CM | POA: Insufficient documentation

## 2019-05-24 DIAGNOSIS — R0609 Other forms of dyspnea: Secondary | ICD-10-CM

## 2019-05-24 DIAGNOSIS — I110 Hypertensive heart disease with heart failure: Secondary | ICD-10-CM | POA: Insufficient documentation

## 2019-05-24 DIAGNOSIS — E785 Hyperlipidemia, unspecified: Secondary | ICD-10-CM

## 2019-05-24 DIAGNOSIS — I428 Other cardiomyopathies: Secondary | ICD-10-CM

## 2019-05-24 DIAGNOSIS — E1169 Type 2 diabetes mellitus with other specified complication: Secondary | ICD-10-CM

## 2019-05-24 DIAGNOSIS — I4729 Other ventricular tachycardia: Secondary | ICD-10-CM

## 2019-05-24 DIAGNOSIS — N289 Disorder of kidney and ureter, unspecified: Secondary | ICD-10-CM

## 2019-05-24 DIAGNOSIS — I493 Ventricular premature depolarization: Secondary | ICD-10-CM

## 2019-05-24 DIAGNOSIS — R739 Hyperglycemia, unspecified: Secondary | ICD-10-CM

## 2019-05-24 DIAGNOSIS — I10 Essential (primary) hypertension: Secondary | ICD-10-CM

## 2019-05-24 DIAGNOSIS — I495 Sick sinus syndrome: Secondary | ICD-10-CM

## 2019-05-24 DIAGNOSIS — R0600 Dyspnea, unspecified: Secondary | ICD-10-CM

## 2019-05-24 DIAGNOSIS — I472 Ventricular tachycardia: Secondary | ICD-10-CM

## 2019-05-24 LAB — I-STAT CHEM 8 CARTRIDGE
Anion Gap I-Stat: 13 (ref 7.0–16.0)
BUN I-Stat: 36 mg/dL — ABNORMAL HIGH (ref 7–22)
Calcium Ionized I-Stat: 5.4 mg/dL — ABNORMAL HIGH (ref 4.35–5.10)
Chloride I-Stat: 98 mMol/L (ref 98–110)
Creatinine I-Stat: 1.5 mg/dL — ABNORMAL HIGH (ref 0.80–1.30)
EGFR: 53 mL/min/{1.73_m2} — ABNORMAL LOW (ref 60–150)
Glucose I-Stat: 137 mg/dL — ABNORMAL HIGH (ref 71–99)
Hematocrit I-Stat: 42 % (ref 39.0–52.5)
Hemoglobin I-Stat: 14.3 gm/dL (ref 13.0–17.5)
Potassium I-Stat: 4.1 mMol/L (ref 3.5–5.3)
Sodium I-Stat: 136 mMol/L (ref 136–147)
TCO2 I-Stat: 30 mMol/L — ABNORMAL HIGH (ref 24–29)

## 2019-05-24 MED ORDER — BUMETANIDE 2 MG PO TABS
ORAL_TABLET | ORAL | 3 refills | Status: DC
Start: 2019-05-24 — End: 2019-06-17

## 2019-05-24 MED ORDER — BUMETANIDE 0.25 MG/ML IJ SOLN
2.50 mg | Freq: Once | INTRAMUSCULAR | Status: AC
Start: 2019-05-24 — End: 2019-05-24
  Administered 2019-05-24: 2.5 mg via INTRAVENOUS
  Filled 2019-05-24: qty 10

## 2019-05-24 MED ORDER — METOPROLOL SUCCINATE ER 200 MG PO TB24
200.00 mg | ORAL_TABLET | Freq: Every day | ORAL | 2 refills | Status: DC
Start: 2019-05-24 — End: 2019-06-24

## 2019-05-24 NOTE — Patient Instructions (Addendum)
Increase Toprol-XL 200 mg daily.  IV Bumex 2.5mg  today.  Increase Bumex to 6mg  twice a day at home.   Increase Potassium to 40 MEQ daily    Call in one week to let us know how you are feeling.

## 2019-05-24 NOTE — Progress Notes (Addendum)
[facility]    I had the pleasure of seeing [name] today at [facility] for an advanced heart failure follow up.  Follows with Dr. [name] for cardiology.    Samuel Koch is a 49 y.o. male with nonischemic cardiomyopathy, chronic combined systolic and diastolic heart failure, latest EF 50-55%, NYHA Class 3, ACC/AHA Stage C.  Has dual-chamber pacemaker in place.  Status post PVC ablation.  Also uncontrolled type II diabetic, obesity.       He has been requiring IV diuretic pushes and up titration of oral diuretics at home.  He also does not do well when he takes trips, travels very often for his job.  Recently went to [place] gained over 10 pounds there prior to last visit.  Also been having occasional episodes of syncope when he laughs too hard but this is getting a bit better.    Still with orthopnea, sleeps in an inclined bed.  After last visit, had device interrogation, gave IV Bumex 2.5 mg, prescribed oral potassium, recommend to continue with same Bumex dose of 4 mg in the morning but double the afternoon dose.  Despite these changes, he is up with additional 7 pounds this visit, currently 3 and 30 pounds.  He did have another trip where he acknowledges he did not eat very healthy, he tends to stick to quick food, and he knows he resolved.  He does not follow a diet fluid and sodium restriction.    Also complains of more fatigue in general, used to be related to the gym 5 days a week, but now does not have the energy to do so.    Device interrogation 04/20/2019 showed normal functioning device, frequent episodes of SVT, sometimes up to 158 bpm.  On i-STAT lab work today potassium 4.1, bicarb 30, creatinine 1.5.      Review of Systems       Comprehensive review of systems performed by me. Unless otherwise noted, all systems are negative.      Most recent laboratory testing reveals:   LABORATORY DATA:  Lab Results   Component Value  Date    BUN 16 01/08/2019    NA 137 01/08/2019    K 4.2 01/08/2019    CL 105 01/08/2019    CO2 25 01/08/2019     Lab Results   Component Value Date    WBC 4.4 01/08/2019    HGB 13.2 01/08/2019    HCT 39.5 01/08/2019    MCV 92 01/08/2019    PLT 207 01/08/2019     Results     ** No results found for the last 24 hours. **            Medical Problems:  Patient Active Problem List    Diagnosis Date Noted    Chronic diastolic HF (heart failure) 06/08/2019    Adjustment and management of cardiac pacemaker 07/08/2018    Class 2 severe obesity with serious comorbidity and body mass index (BMI) of 39.0 to 39.9 in adult, unspecified obesity type 07/08/2018    SSS (sick sinus syndrome) 07/08/2018    PVC (premature ventricular contraction) 07/08/2018    Chest pain 03/26/2018    NICM (nonischemic cardiomyopathy) 10/20/2017    Chronic combined systolic and diastolic HF (heart failure) 10/20/2017    Obesity, morbid, BMI 40.0-49.9 10/07/2017    DOE (dyspnea on exertion) 10/06/2017    Hyperglycemia 03/29/2017    Essential hypertension 03/29/2017  Renal insufficiency 03/29/2017    NSVT (nonsustained ventricular tachycardia) 08/17/2015    DM type 2 with diabetic dyslipidemia 07/31/2015       History from Cardiology at [facility]  1. Chronic combined systolic and diastolic CHF    2. Nonischemic cardiomyopathy  A. LHC (6/17): 30% mid RCA, no other significant disease.   B. Echo (6/17): EF 30-35%.   C. Holter (7/17): 19% PVCs.   D. Echo (3/18): EF 50-55%, moderate LVH, grade II diastolic dysfunction.   E. Echo (9/18): EF 50-55%  F. Echo 10/07/17- LV cavity size is increased. The LVEF is est. at 50%. Mild hypokinesis of the distal septum and apex which may be due to paced rhythm. Pacer wire present in RV.   G. Echo 06/12/2018- Mild concentric LVH, EF 50-55%, aortic root is mildy dilated, aortic root is 3.9cm, tricuspid regurgitation is mild, RSVP .     3. Type II diabetes    4. Hypertriglyceridemia    5. PVCs  A. 48  hour holter (7/17) with 19% PVCs.  B. PVC ablation in 8/17 but PVCs recurred. Will   C. Holter (11/17) with rare PVCs, < 1%.   D. Holter (9/18) with rare PVCs only    6. Bradycardia/ SSS  A. Dual chamber pacemaker placed ([employer]tronic, MRI compatible).     7. NSVT    8. CAD evaluation   A. Echo 03/27/2018- EF 50%, no regional wall motion abnormalities present, grade 2 DD.  no significant valvular heart disease.   B. Stress 03/28/2018- Completed vasodilator protocol., No ischemic changes with infusion, No arrhythmias,low risk. Perfusion defect noted thought to be diaphragmatic attenuation  Dilated LV with mildly decreased left ventricular function, stress EF 44%.   C. THC 03/30/2018- Non obstructive cad.  CO 5 CI 2.0 wedge 14 mean PA 22    Past Medical History:   Diagnosis Date    Cardiomyopathy     Congestive heart failure     Diabetes mellitus     Hypertension     PVC's (premature ventricular contractions)      Past Surgical History:   Procedure Laterality Date    APPENDECTOMY      CARDIAC PACEMAKER PLACEMENT      RIGHT & LEFT HEART CATH  W/ CORONARY ANGIOS, LV/LA Right 03/30/2018    Procedure: RIGHT & LEFT HEART CATH  W/ CORONARY ANGIOS, LV/LA;  Surgeon: Santo Held, MD;  Location: [facility] CATH[facility]EP;  Service: Cardiovascular;  Laterality: Right;    RIGHT HEART CATH Right 06/08/2019    Procedure: RIGHT HEART CATH;  Surgeon: Adonis Brook, MD;  [facility] CATH[facility]EP;  Service: Cardiovascular;  Laterality: Right;  ARRIVAL TIME     Family History   Problem Relation Age of Onset    Heart disease Father     Hypertension Father     Heart disease Sister     Heart attack Sister     Heart disease Brother     Diabetes Brother     Diabetes Paternal Grandmother      Social History     Socioeconomic History    Marital status: Married     Spouse name: Not on file    Number of children: Not on file    Years of education: Not on file    Highest education level: Bachelor's degree (e.g., BA, AB, BS)   Occupational  History    Not on file   Tobacco Use    Smoking status: Former Smoker  Smokeless tobacco: Former Neurosurgeon    Tobacco comment: when 49 years old   Substance and Sexual Activity    Alcohol use: No    Drug use: No    Sexual activity: Not on file     Comment: Deferred   Other Topics Concern    Not on file   Social History Narrative    Not on file     Social Determinants of Health     Financial Resource Strain: Medium Risk    Difficulty of Paying Living Expenses: Somewhat hard   Food Insecurity: No Food Insecurity    Worried About Running Out of Food in the Last Year: Never true    Ran Out of Food in the Last Year: Never true   Transportation Needs: No Transportation Needs    Lack of Transportation (Medical): No    Lack of Transportation (Non-Medical): No   Physical Activity:     Days of Exercise per Week:     Minutes of Exercise per Session:    Stress:     Feeling of Stress :    Social Connections:     Frequency of Communication with Friends and Family:     Frequency of Social Gatherings with Friends and Family:     Attends Religious Services:     Active Member of Clubs or Organizations:     Attends Engineer, structural:     Marital Status:    Intimate Partner Violence:     Fear of Current or Ex-Partner:     Emotionally Abused:     Physically Abused:     Sexually Abused:      Married   Non smoker - isolated smoking before age 67  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 270 tablet 3    Continuous Blood Gluc Sensor (FreeStyle Libre 2 Sensor Systm) Misc 1 Device by Does not apply route every 14 (fourteen) days 2 each 12    glucose blood test strip Check BS 5 times daily 150 each 11    insulin glargine (LANTUS) 100 UNIT/ML injection Inject 24 Units into the skin every 12 (twelve) hours 45 mL 3    insulin lispro, 1 Unit Dial, (HumaLOG KwikPen) 100 UNIT/ML Solution Pen-injector injection pen 10 unit TID pre meals plus sliding scale 2:50>150 with max of 60 units per day (Patient taking differently: 15 units TID pre meals plus sliding scale 2:50>150 with max of 60 units per day  ) 54 mL 3    Insulin Pen Needle (PEN NEEDLES 3/16") 31G X 5 MM Misc 1 each by Does not apply route 4 (four) times daily 901 each 1    Lancets Ultra Fine Misc Check BS 5 times daily 150 each 11    lisinopril (ZESTRIL) 10 MG tablet Take 10 mg by mouth daily  metFORMIN (GLUCOPHAGE) 500 MG tablet 2 tablets (1000 mg) with breakfast and 1 tablets (500 mg) with dinner 270 tablet 3    potassium chloride (KLOR-CON) 10 MEQ tablet Take 40 mEq by mouth daily        spironolactone (ALDACTONE) 25 MG tablet Take 25 mg by mouth daily      metoprolol succinate (Toprol XL) 200 MG 24 hr tablet Take 1 tablet (200 mg total) by mouth daily 90 tablet 2    Semaglutide,0.25 or 0.5MG /DOS, (Ozempic, 0.25 or 0.5 MG/DOSE,) 2 MG/1.5ML Solution Pen-injector Inject 0.5 mg into the skin once a week 3 mL 3     No current facility-administered medications for this encounter.     No Known Allergies    Vitals:    Vitals:    05/24/19 1415   BP: 140/82   Pulse: 96   Resp: 18   SpO2: 98%       Physical Exam:   General: overweight male, alert, NAD  HEENT:  Anicteric sclera, supple  Lungs: diminished bases, no crackles or rails.   Cardiovascular: Moderate JVD, s1s2, normal rate, regular rhythm, no m/r/g  Abdominal: obese/distended, nontender  Extremities: warm to touch, trace edema  Neuromuscular  exam: grossly non-focal, though not formally tested.    Assessment and Plan:  1. Hyperglycemia     2. Essential hypertension     3. Renal insufficiency     4. DOE (dyspnea on exertion)     5. Obesity, morbid, BMI 40.0-49.9     6. NICM (nonischemic cardiomyopathy)  Cardiac Cath / EP Case Request - [facility]   7. Chronic combined systolic and diastolic HF (heart failure)  Cardiac Cath / EP Case Request - [facility]   8. SSS (sick sinus syndrome)     9. PVC (premature ventricular contraction)     10. NSVT (nonsustained ventricular tachycardia)     11. DM type 2 with diabetic dyslipidemia         Cardiomyopathy:   My impression is that, Mr. Mabey has nonischemic cardiomyopathy, chronic systolic/diastolic heart failure ACC/AHA stage C, class III with improved EF.  Did have excessive PVC status post ablation in the past.  Remains on guideline directed heart failure medical therapy.    More episodes of SVT, may require another ablation.  For now we will try to optimize on beta-blocker.  Remains volume overloaded, positive orthopnea, some PND.      In regards to OMM:  Beta-blocker: toprol XL  ACE-I/ARB/ARNI: yes  Aldosterone antagonist: yes  Hydralazine/nitrate: no   Digoxin: no    Medication changes:  Increase Toprol-XL 200 mg daily.  IV Bumex 2.5mg  today in clinic  Increase Bumex 6mg  twice a day.  Increase Potassium to 40 MEQ  Consider RHC in future.  Give [place] a call in 1 week with progress.        Electronically signed by:  Faythe Ghee, MD  [facility]  [address], [address]  [place], [place] [address]  [phone]    [facility]  [address]  [place], [place] [address]  [phone]

## 2019-05-24 NOTE — Addendum Note (Signed)
Encounter addended by: Elizbeth Squires, NP on: 05/24/2019 3:21 PM   Actions taken: MAR administration accepted, Charge Capture section accepted

## 2019-05-24 NOTE — Telephone Encounter (Signed)
Scheduled June appointment--left a message for the patient and mailed out appointment letter.    Thanks,  Karena Addison

## 2019-05-24 NOTE — Telephone Encounter (Signed)
Please schedule pt for a device check in June due to home monitor not connected. A letter has been sent also.     Thanks,   [place]

## 2019-06-02 ENCOUNTER — Telehealth: Payer: Self-pay

## 2019-06-02 NOTE — Addendum Note (Signed)
Encounter addended by: Adonis Brook, MD on: 06/02/2019 1:46 PM   Actions taken: Order list changed, Diagnosis association updated

## 2019-06-02 NOTE — Telephone Encounter (Signed)
Pt's wife called in to let us know that he is still not feeling well. He continues to c/o fatigue and sob/doe.     Per Dr. Mellody Drown, they would like to proceed with RHC at this time. This was scheduled for 5/4. PT and wife agreeable to this plan.    Cath holding will call him with instructions and exact time on Monday.     Marland Kitchen, CNA

## 2019-06-08 ENCOUNTER — Ambulatory Visit: Payer: Self-pay | Admitting: Internal Medicine

## 2019-06-08 ENCOUNTER — Ambulatory Visit
Admission: RE | Admit: 2019-06-08 | Discharge: 2019-06-08 | Disposition: A | Discharge status: Home / Self Care | Payer: 59 | Source: Ambulatory Visit | Attending: Internal Medicine | Admitting: Internal Medicine

## 2019-06-08 ENCOUNTER — Encounter: Payer: Self-pay | Admitting: Internal Medicine

## 2019-06-08 ENCOUNTER — Encounter
Admission: RE | Disposition: A | Discharge status: Home / Self Care | Payer: Self-pay | Source: Ambulatory Visit | Attending: Internal Medicine

## 2019-06-08 DIAGNOSIS — I5032 Chronic diastolic (congestive) heart failure: Secondary | ICD-10-CM | POA: Diagnosis present

## 2019-06-08 DIAGNOSIS — I5042 Chronic combined systolic (congestive) and diastolic (congestive) heart failure: Secondary | ICD-10-CM | POA: Insufficient documentation

## 2019-06-08 DIAGNOSIS — I471 Supraventricular tachycardia: Secondary | ICD-10-CM | POA: Insufficient documentation

## 2019-06-08 DIAGNOSIS — Z794 Long term (current) use of insulin: Secondary | ICD-10-CM | POA: Insufficient documentation

## 2019-06-08 DIAGNOSIS — Z87891 Personal history of nicotine dependence: Secondary | ICD-10-CM | POA: Insufficient documentation

## 2019-06-08 DIAGNOSIS — I110 Hypertensive heart disease with heart failure: Secondary | ICD-10-CM | POA: Insufficient documentation

## 2019-06-08 DIAGNOSIS — Z950 Presence of cardiac pacemaker: Secondary | ICD-10-CM | POA: Insufficient documentation

## 2019-06-08 DIAGNOSIS — E669 Obesity, unspecified: Secondary | ICD-10-CM | POA: Insufficient documentation

## 2019-06-08 DIAGNOSIS — Z79899 Other long term (current) drug therapy: Secondary | ICD-10-CM | POA: Insufficient documentation

## 2019-06-08 DIAGNOSIS — I428 Other cardiomyopathies: Secondary | ICD-10-CM | POA: Insufficient documentation

## 2019-06-08 DIAGNOSIS — Z8249 Family history of ischemic heart disease and other diseases of the circulatory system: Secondary | ICD-10-CM | POA: Insufficient documentation

## 2019-06-08 DIAGNOSIS — Z833 Family history of diabetes mellitus: Secondary | ICD-10-CM | POA: Insufficient documentation

## 2019-06-08 DIAGNOSIS — E1165 Type 2 diabetes mellitus with hyperglycemia: Secondary | ICD-10-CM | POA: Insufficient documentation

## 2019-06-08 SURGERY — RIGHT HEART CATH
Anesthesia: Anesthesia Choice | Laterality: Right

## 2019-06-08 MED ORDER — VH HEPARIN 10,000 UNITS/1000 ML NS (IR NARRATOR)
INJECTION | INTRAVENOUS | Status: DC | PRN
Start: 2019-06-08 — End: 2019-06-08
  Administered 2019-06-08: 1 via INTRAVENOUS

## 2019-06-08 MED ORDER — VH HEPARIN 2 UNIT/ML 0.9% SODIUM CHLORIDE (IRRIGATION) (IR NARRATOR)
INTRAMUSCULAR | Status: DC | PRN
Start: 2019-06-08 — End: 2019-06-08
  Administered 2019-06-08: 1

## 2019-06-08 MED ORDER — HEPARIN (PORCINE) IN NACL 2-0.9 UNIT/ML-% IJ SOLN (WRAP)
INTRAVENOUS | Status: DC
Start: 2019-06-08 — End: 2019-06-08
  Filled 2019-06-08: qty 500

## 2019-06-08 MED ORDER — MIDAZOLAM HCL 1 MG/ML IJ SOLN (WRAP)
INTRAMUSCULAR | Status: AC
Start: 2019-06-08 — End: ?
  Filled 2019-06-08: qty 2

## 2019-06-08 MED ORDER — VH HEPARIN 10,000 UNITS/1000 ML NS
INJECTION | INTRAVENOUS | Status: DC
Start: 2019-06-08 — End: 2019-06-08
  Filled 2019-06-08: qty 1000

## 2019-06-08 MED ORDER — LIDOCAINE HCL (PF) 1.5 % IJ SOLN
INTRAMUSCULAR | Status: DC | PRN
Start: 2019-06-08 — End: 2019-06-08
  Administered 2019-06-08: 5 mL via INTRADERMAL

## 2019-06-08 MED ORDER — FENTANYL CITRATE (PF) 50 MCG/ML IJ SOLN (WRAP)
INTRAMUSCULAR | Status: AC
Start: 2019-06-08 — End: ?
  Filled 2019-06-08: qty 2

## 2019-06-08 MED ORDER — LIDOCAINE HCL (PF) 1.5 % IJ SOLN
INTRAMUSCULAR | Status: AC
Start: 2019-06-08 — End: ?
  Filled 2019-06-08: qty 10

## 2019-06-08 NOTE — Progress Notes (Signed)
Discharge instructions reviewed with pt, pt verbalized understanding, IV discontinued, cath intact, tele off, pt up to get dressed, pt discharge ambulatory to home with family.

## 2019-06-08 NOTE — Discharge Summary (Signed)
Cardiology Discharge Summary     Patient Name: Samuel Koch, Samuel Koch  MRN#: [id number]  DOB: May 15, 1970  PCP: Edmon Crape, MD  Attending: Adonis Brook, MD  Primary Cardiologist: Mellody Drown  Date of Admission: 06/08/2019  Date of Discharge: 06/08/2019  LOS: 0 days     Reason for Admission:   NICM (nonischemic cardiomyopathy) [I42.8]  Chronic combined systolic and diastolic HF (heart failure) [I50.42]  Chronic diastolic HF (heart failure) [I50.32]    Discharge Diagnoses:    Active Problems:    NICM (nonischemic cardiomyopathy)    Chronic combined systolic and diastolic HF (heart failure)    Chronic diastolic HF (heart failure)  Resolved Problems:    * No resolved hospital problems. [facility] Course:      Right Heart Catheterization      Patient Name:  Samuel Koch [facility]  MEDICAL RECORD NUMBER [id number]   Date of Birth:  [birth year]   Date of Procedure:  06/08/2019  Operator:        Adonis Brook, MD  Diagnosis:      Heart Failure  Sedation:        No  Description:   Right antecubital vein prepped and draped in usual sterile fashion.  Local lidocaine was infiltrated as anesthetic.  A 170F sheath was introduced into the vein using a modified Seldinger's technique.    Ultrasound guidance was used.  Using fluoroscopic guidance a 170F balloon tipped pulmonary artery catheter was passed through the veins to the  left pulmonary artery attempting to get to a posterior descending branch.   Hemodynamics were assessed.  The venous sheath is removed and manual pressure is applied.  The patient was transferred to the [facility] in stable condition.        Complications: none     Pressure (S/D/EDP mmHg) Mean (mmHg) V-waves (mmHg)   Right Atrium  12    Right Ventricle 33/7     Pulmonary Artery 30/12 21    Wedge  16      Heart Rate 61 bpm   Blood Pressure  mmHg   PA saturation 65 %   Ao saturation 96 %   Fick CO 5.9 L/min   Fick CI 2.2 L/min/m2   Pulm Vasc Resistance  WU   Thermodilution CO  L/min   Thermodilution CI   L/min/m2           Discharge Follow Up:   No follow-up provider specified.    Discharge Medications:        Discharge Medication List      Taking    atorvastatin 20 MG tablet  Commonly known as: LIPITOR  TAKE 1 TABLET BY MOUTH ONCE DAILY AT NIGHT     Blood Glucose Monitor (generic) Kit  Use as directed to monitor blood sugar     bumetanide 2 MG tablet  Commonly known as: BUMEX  Take 3 tablets (6 mg total) by mouth every morning AND 3 tablets (6 mg total) Daily after lunch.     FreeStyle Libre 2 Sensor Systm Misc  Dose: 1 Device  1 Device by Does not apply route every 14 (fourteen) days     glucose blood test strip  Check BS 5 times daily     insulin glargine 100 UNIT/ML injection  Dose: 24 Units  Commonly known as: LANTUS  Inject 24 Units into the skin every 12 (twelve) hours     insulin lispro (1 Unit Dial) 100 UNIT/ML Sopn injection pen  What changed: additional instructions  Commonly known as: HumaLOG KwikPen  10 unit TID pre meals plus sliding scale 2:50>150 with max of 60 units per day     Lancets Ultra Fine Misc  Check BS 5 times daily     lisinopril 10 MG tablet  Dose: 10 mg  Commonly known as: ZESTRIL  Take 10 mg by mouth daily     metFORMIN 500 MG tablet  Commonly known as: GLUCOPHAGE  2 tablets (1000 mg) with breakfast and 1 tablets (500 mg) with dinner     metoprolol succinate 200 MG 24 hr tablet  Dose: 200 mg  Commonly known as: Toprol XL  Take 1 tablet (200 mg total) by mouth daily     OneTouch Ultra 2 w/Device Kit  USE AS DIRECTED TO MONITOR BLOOD SUGAR     Pen Needles 3/16" 31G X 5 MM Misc  Dose: 1 each  1 each by Does not apply route 4 (four) times daily     potassium chloride 10 MEQ tablet  Dose: 40 mEq  Commonly known as: KLOR-CON  Take 40 mEq by mouth daily     Semaglutide (1 MG/DOSE) 2 MG/1.5ML Sopn  Dose: 1 mg  Inject 1 mg into the skin once a week     spironolactone 25 MG tablet  Dose: 25 mg  Commonly known as: ALDACTONE  Take 25 mg by mouth daily             Hospital Labs:                                     Time spent examining patient, discussing with patient/family regarding hospital course, chart review, reconciling medications and discharge planning: 25 minutes.      Signed by: Adonis Brook, MD ,

## 2019-06-08 NOTE — H&P (Signed)
Cardiology Admission History and Physical   Date Time: 06/08/19 9:40 AM  Patient Name: Samuel Koch, Samuel Koch  MRN#: [id number]  DOB: [birth year]  PCP: Edmon Crape, MD  Primary Cardiologist: Faythe Ghee, MD     Reason for Admission:   RHC    Assessment / Plan   1. NICM EF 50-55%  2. NSVT  3. Bradycardia s/p PPM  4. DM2      PLAN:  Proceed with procedure    Consent Obtained for procedure.  Adonis Brook, MD to assess patient prior to procedure. Questions about procedure answered and risks reviewed.     Previous Anesthetic or sedation adverse reactions: No    Time and Ashby Dawes of Last Oral Intake:  Time of Last oral meal: 00:01 06/08/2019   No Liquid intake in the past 2 hours    ASA Physical Status Classification System:  Class 3 - Severe systemic disease, limits normal activity but not incapacitating    Mallampati Airway classification: Class III: Soft and hard palate and base of the uvula are visible    Anesthesia Plan: Moderate Sedation    Estimated Blood Loss: Minimal, <58mL.     History of Present Illness:       Samuel Koch is a 49 y.o. male with a PMH of nonischemic cardiomyopathy, chronic combined systolic and diastolic heart failure, latest EF 50-55%, NYHA Class 3, ACC/AHA Stage C.  Has dual-chamber pacemaker in place.  Status post PVC ablation.  Also uncontrolled type II diabetic, obesity. He presents today for RHC with Dr. Mellody Drown.     He has been requiring IV diuretic pushes and up titration of oral diuretics at home.  He also does not do well when he takes trips, travels very often for his job.  Recently went to [place] gained over 10 pounds there prior to last visit.  Also been having occasional episodes of syncope when he laughs too hard but this is getting a bit better.    Still with orthopnea, sleeps in an inclined bed.  After last visit, had device interrogation, gave IV Bumex 2.5 mg, prescribed oral potassium, recommend to continue with same Bumex dose of 4 mg in the morning but  double the afternoon dose.  Despite these changes, he is up with additional 7 pounds this visit, currently 3 and 30 pounds.  He did have another trip where he acknowledges he did not eat very healthy, he tends to stick to quick food, and he knows he resolved.  He does not follow a diet fluid and sodium restriction.    Also complains of more fatigue in general, used to be related to the gym 5 days a week, but now does not have the energy to do so.    Device interrogation 04/20/2019 showed normal functioning device, frequent episodes of SVT, sometimes up to 158 bpm.  On i-STAT lab work today potassium 4.1, bicarb 30, creatinine 1.5.        Problem List:   Active Problems:    NICM (nonischemic cardiomyopathy)    Chronic combined systolic and diastolic HF (heart failure)    Past Medical History:     Past Medical History:   Diagnosis Date    Cardiomyopathy     Congestive heart failure     Diabetes mellitus     Hypertension     PVC's (premature ventricular contractions)      Past Surgical History:     Past Surgical History:   Procedure Laterality Date  APPENDECTOMY      CARDIAC PACEMAKER PLACEMENT      RIGHT & LEFT HEART CATH  W/ CORONARY ANGIOS, LV/LA Right 03/30/2018    Procedure: RIGHT & LEFT HEART CATH  W/ CORONARY ANGIOS, LV/LA;  Surgeon: Santo Held, MD;  Location: [facility] CATH/EP;  Service: Cardiovascular;  Laterality: Right;     Family History:     Family History   Problem Relation Age of Onset    Heart disease Father     Hypertension Father     Heart disease Sister     Heart attack Sister     Heart disease Brother     Diabetes Brother     Diabetes Paternal Grandmother      Social History:     Social History     Socioeconomic History    Marital status: Married     Spouse name: Not on file    Number of children: Not on file    Years of education: Not on file    Highest education level: Bachelor's degree (e.g., BA, AB, BS)   Occupational History    Not on file   Tobacco Use    Smoking status:  Former Smoker    Smokeless tobacco: Former Neurosurgeon    Tobacco comment: when 49 years old   Substance and Sexual Activity    Alcohol use: No    Drug use: No    Sexual activity: Not on file     Comment: Deferred   Other Topics Concern    Not on file   Social History Narrative    Not on file     Social Determinants of Health     Financial Resource Strain: Medium Risk    Difficulty of Paying Living Expenses: Somewhat hard   Food Insecurity: No Food Insecurity    Worried About Programme researcher, broadcasting/film/video in the Last Year: Never true    Ran Out of Food in the Last Year: Never true   Transportation Needs: No Transportation Needs    Lack of Transportation (Medical): No    Lack of Transportation (Non-Medical): No   Physical Activity:     Days of Exercise per Week:     Minutes of Exercise per Session:    Stress:     Feeling of Stress :    Social Connections:     Frequency of Communication with Friends and Family:     Frequency of Social Gatherings with Friends and Family:     Attends Religious Services:     Active Member of Clubs or Organizations:     Attends Banker Meetings:     Marital Status:    Intimate Partner Violence:     Fear of Current or Ex-Partner:     Emotionally Abused:     Physically Abused:     Sexually Abused:      Allergies:   No Known Allergies  Medications:   Home Meds:  Home Medications             atorvastatin (LIPITOR) 20 MG tablet     TAKE 1 TABLET BY MOUTH ONCE DAILY AT NIGHT     Blood Glucose Monitor, generic, Kit     Use as directed to monitor blood sugar     Blood Glucose Monitoring Suppl (OneTouch Ultra 2) w/Device Kit     USE AS DIRECTED TO MONITOR BLOOD SUGAR     bumetanide (BUMEX) 2 MG tablet     Take 3 tablets (  6 mg total) by mouth every morning AND 3 tablets (6 mg total) Daily after lunch.     Continuous Blood Gluc Sensor (FreeStyle Libre 2 Sensor Systm) Misc     1 Device by Does not apply route every 14 (fourteen) days     glucose blood test strip     Check BS 5  times daily     insulin glargine (LANTUS) 100 UNIT/ML injection     Inject 24 Units into the skin every 12 (twelve) hours     insulin lispro, 1 Unit Dial, (HumaLOG KwikPen) 100 UNIT/ML Solution Pen-injector injection pen     10 unit TID pre meals plus sliding scale 2:50>150 with max of 60 units per day     Patient taking differently: 15 units TID pre meals plus sliding scale 2:50>150 with max of 60 units per day       Insulin Pen Needle (PEN NEEDLES 3/16") 31G X 5 MM Misc     1 each by Does not apply route 4 (four) times daily     Lancets Ultra Fine Misc     Check BS 5 times daily     lisinopril (ZESTRIL) 10 MG tablet     Take 10 mg by mouth daily     metFORMIN (GLUCOPHAGE) 500 MG tablet     2 tablets (1000 mg) with breakfast and 1 tablets (500 mg) with dinner     metoprolol succinate (Toprol XL) 200 MG 24 hr tablet     Take 1 tablet (200 mg total) by mouth daily     potassium chloride (KLOR-CON) 10 MEQ tablet     Take 40 mEq by mouth daily       Semaglutide, 1 MG/DOSE, 2 MG/1.5ML Solution Pen-injector     Inject 1 mg into the skin once a week     spironolactone (ALDACTONE) 25 MG tablet     Take 25 mg by mouth daily        Ongoing Comment    Stottlemyer, Minerva Areola, PharmD    03/26/2018  7:40 PM    03/26/2018 - pt admittedly noncompliant with meds as noted        Review of Systems:   A comprehensive review of systems was: Otherwise negative except as stated above.    Physical Exam:   There were no vitals taken for this visit.  Wt Readings from Last 3 Encounters:   05/24/19 153.4 kg (338 lb 3.2 oz)   04/19/19 150.1 kg (331 lb)   03/29/19 150.1 kg (331 lb)      Gen: A+ O x3, afebrile  Neck: JVP not appreciated  Chest: No wheeze  CV: RRR  Abd: Soft, NT, ND  Ext: WWP, no BLE edema, no evidence of bruit or hematoma  Skin: No rash or lesions noted  Neuro: CN 2-12 intact, no focal deficits      EKG:   EKG:  AP-VS  Labs Reviewed:     Results     ** No results found for the last 24 hours. **          Signed by: Dale Durham,  NP

## 2019-06-08 NOTE — Procedures (Addendum)
Right Heart Catheterization      Patient Name:  Samuel Koch [facility]  MEDICAL RECORD NUMBER [id number]   Date of Birth:  Dec 01, 1970   Date of Procedure:  06/08/2019  Operator: Adonis Brook, MD  Diagnosis: Heart Failure  Sedation: No  Description: Right antecubital vein prepped and draped in usual sterile fashion.  Local lidocaine was infiltrated as anesthetic.  A 611F sheath was introduced into the vein using a modified Seldinger's technique.    Ultrasound guidance was used.  Using fluoroscopic guidance a 611F balloon tipped pulmonary artery catheter was passed through the veins to the  left pulmonary artery attempting to get to a posterior descending branch.   Hemodynamics were assessed.  The venous sheath is removed and manual pressure is applied.  The patient was transferred to the [facility] in stable condition.        Complications: none     Pressure (S/D/EDP mmHg) Mean (mmHg) V-waves (mmHg)   Right Atrium  12    Right Ventricle 33/7     Pulmonary Artery 30/12 21    Wedge  16      Heart Rate 61 bpm   Blood Pressure  mmHg   PA saturation 65 %   Ao saturation 96 %   Fick CO 5.9 L/min   Fick CI 2.2 L/min/m2   Pulm Vasc Resistance  WU   Thermodilution CO  L/min   Thermodilution CI  L/min/m2         Electronically signed by:  Faythe Ghee, MD  [facility]  [address]  [place], [place] [address]  [phone]    [facility] - [facility]  [address]  [place], [place] [address]  [phone]

## 2019-06-09 ENCOUNTER — Ambulatory Visit: Payer: 59 | Attending: Registered Nurse | Admitting: Registered Nurse

## 2019-06-09 ENCOUNTER — Telehealth: Payer: Self-pay

## 2019-06-09 ENCOUNTER — Encounter (INDEPENDENT_AMBULATORY_CARE_PROVIDER_SITE_OTHER): Payer: Self-pay | Admitting: Registered Nurse

## 2019-06-09 VITALS — Ht 74.02 in | Wt 334.0 lb

## 2019-06-09 DIAGNOSIS — E1165 Type 2 diabetes mellitus with hyperglycemia: Secondary | ICD-10-CM

## 2019-06-09 DIAGNOSIS — Z794 Long term (current) use of insulin: Secondary | ICD-10-CM

## 2019-06-09 DIAGNOSIS — E782 Mixed hyperlipidemia: Secondary | ICD-10-CM

## 2019-06-09 DIAGNOSIS — G629 Polyneuropathy, unspecified: Secondary | ICD-10-CM

## 2019-06-09 LAB — ECG 12-LEAD
P-R Interval: 150 ms
Patient Age: 49 years
Q-T Interval(Corrected): 419 ms
Q-T Interval: 375 ms
QRS Axis: 23 deg
QRS Duration: 92 ms
T Axis: 43 years
Ventricular Rate: 75 //min

## 2019-06-09 MED ORDER — OZEMPIC (0.25 OR 0.5 MG/DOSE) 2 MG/1.5ML SC SOPN
0.50 mg | PEN_INJECTOR | SUBCUTANEOUS | 3 refills | Status: DC
Start: 2019-06-09 — End: 2020-01-26

## 2019-06-09 NOTE — Patient Instructions (Signed)
Restart Ozempic 0.5 mg injections weekly  Increase Humalog to 12 units plus add your same scale  Send blood sugars to the office in 2 weeks.      If having hypoglycemia, remember to follow the Rule of 15.  Eat or drink 15 grams of fasting-acting carbohydrates (such as 4 glucose tablets or 4 ounces of juice or regular soda). Blood sugar should be rechecked in 15 minutes and if still low, repeat the treatment.

## 2019-06-09 NOTE — Progress Notes (Signed)
Endocrinology Follow Up Note    Patient Name:  Samuel Koch [[id number]] DOB: July 10, 1970  Date: 06/09/2019    Subjective:      Samuel Koch is a 49 y.o. male here for follow up of mildly  uncontrolled type 2 diabetes mellitus.  Diabetes was diagnosed 2017.  Hgb A1c was 7.7% on 05/21/19, previously 10.8% on 11/08/18, 8.5% on 04/14/18, 8.3% on 04/02/18, 8.2% on 03/26/18.   Hgb A1c goal based on age and comorbidities is <7%.  Complications from diabetes include neuropathy and erectile dysfunction.  He has hyperlipidemia and CHF and followed by the heart failure clinic.    This visit was conducted with the use of interactive HIPAA compliant visual telecommunication that permitted real time communication between [employer] and myself.   He consented to participation and received services at home, while I was located at [facility], [place], [place]  This visit was changed from an in-person visit to a telehealth visit as he had a heart cath yesterday and was not sure how he would be feeling.     Current Diabetes Medications Include:  -Lantus 26 units BID  -Humalog 10units TID plus 2:50>150  -Metformin 1000 mg AM and 500 mg PM  - not able to tolerate 2000 mg/day    Patient is tolerating medications well.   He ran out of metformin a few weeks ago.    Had stopped Ozempic 1 mg because it was making him nauseated.  He states since he stopped the Ozempic in March, his blood sugars have gone up.      He reported his blood sugar readings over telehealth.  Fasting this morning was 208 and have been in the low 200 since stopping Ozempic 04/21/19.    With Ozempic, blood sugars were 96-160s, one at 177.    Reports predinner sugars to be 180-250    Diet, Exercise, and Weight:     The patient tries to watch portions, starches and sweets.  He doesn't eat potatoes.  Has a lot of protein and vegetables.  He drinks coffee and water.  He does not drink surgery drinks.    He has been busy with his  job and not exercising regularly.   He is very concerned about his weight.  At one time he was 350 lbs, got down to 295 lbs and is now up to 334 lbs.   He did consider weight loss surgery but he knows his insurance does not cover it as he has already checked.        Diabetic Education: He did met with [facility] DM program CDE on 12/28/18.         Diabetic Complication Screening/Treatment  Eye exam:    Denies vision problems.  States had a recent visit (unsure of name) and does not have diabetic retinopathy.     Urine microalbumin: negative;  Renal labs on 05/21/19 demonstrate Creat 1.59, BUN 50, eGFR 49.03.  Microalbumin/creat ratio was 7 on 05/21/19.      Foot Care: Positive for neuropathy and states he has numbness in he toes.  Does not take any medications.   He care for his feet with the help of his wife. Denies wounds or ulcers  Monofilament: partially intact Completed on 11/05/18.    Aspirin Therapy: Does not take ASA    Cardiovascular risk factors: diabetes mellitus, dyslipidemia, family history of premature cardiovascular disease, hypertension, male gender and obesity (BMI >= 30 kg/m2);  Negative for smoking.  Negative for alcohol use.   He has CHF, cardiomyopathy and is followed by Dr. Craig Staggers and heart failure clinic.  He had a right heart cath 06/08/19.  He has a pacemaker that was implanted for SSS and is followed by Dr. [name].      Statin therapy: The patient is taking statin therapy as indicated in the medication list below.  Lipid panel on 05/21/19 demonstrates Chol 229, Trig 337 (up from 119), HDL 47, LDL 107 (up from 89).  Currently taking atorvastatin 40 mg daily and this is managed by PCP.     Ace Inhibitor/ARB: The patient is taking ACE-I/ARB at dose indicated in medication list and is tolerating this well. Currently taking lisinopril 10 mg daily    Dental care: The patient has regular dental exams every 6 months.        Current Outpatient Medications   Medication Sig Dispense Refill     atorvastatin (LIPITOR) 20 MG tablet TAKE 1 TABLET BY MOUTH ONCE DAILY AT NIGHT 90 tablet 3    Blood Glucose Monitor, generic, Kit Use as directed to monitor blood sugar 1 kit 1    Blood Glucose Monitoring Suppl (OneTouch Ultra 2) w/Device Kit USE AS DIRECTED TO MONITOR BLOOD SUGAR      bumetanide (BUMEX) 2 MG tablet Take 3 tablets (6 mg total) by mouth every morning AND 3 tablets (6 mg total) Daily after lunch. 270 tablet 3    glucose blood test strip Check BS 5 times daily 150 each 11    insulin glargine (LANTUS) 100 UNIT/ML injection Inject 24 Units into the skin every 12 (twelve) hours 45 mL 3    insulin lispro, 1 Unit Dial, (HumaLOG KwikPen) 100 UNIT/ML Solution Pen-injector injection pen 10 unit TID pre meals plus sliding scale 2:50>150 with max of 60 units per day (Patient taking differently: 15 units TID pre meals plus sliding scale 2:50>150 with max of 60 units per day  ) 54 mL 3    Insulin Pen Needle (PEN NEEDLES 3/16") 31G X 5 MM Misc 1 each by Does not apply route 4 (four) times daily 901 each 1    Lancets Ultra Fine Misc Check BS 5 times daily 150 each 11    lisinopril (ZESTRIL) 10 MG tablet Take 10 mg by mouth daily      metFORMIN (GLUCOPHAGE) 500 MG tablet 2 tablets (1000 mg) with breakfast and 1 tablets (500 mg) with dinner 270 tablet 3    metoprolol succinate (Toprol XL) 200 MG 24 hr tablet Take 1 tablet (200 mg total) by mouth daily 90 tablet 2    potassium chloride (KLOR-CON) 10 MEQ tablet Take 40 mEq by mouth daily        spironolactone (ALDACTONE) 25 MG tablet Take 25 mg by mouth daily      Continuous Blood Gluc Sensor (FreeStyle Libre 2 Sensor Systm) Misc 1 Device by Does not apply route every 14 (fourteen) days 2 each 12    Semaglutide,0.25 or 0.5MG /DOS, (Ozempic, 0.25 or 0.5 MG/DOSE,) 2 MG/1.5ML Solution Pen-injector Inject 0.5 mg into the skin once a week 3 mL 3     No current facility-administered medications for this visit.      Medication review with Patricia Nettle Bernat  suggested compliance most of the time.       There is no immunization history on file for this patient.  The following portions of the patient's history were reviewed and updated as appropriate: allergies, current  medications, past family history, past medical history, past social history, past surgical history and problem list.  The following information was also reviewed at today's visit: office notes from referring provider, hospital records and lab data    Review of Systems  Review of Systems   Constitutional: Positive for malaise/fatigue. Negative for weight loss.   HENT: Negative for congestion and sore throat.    Eyes: Negative for blurred vision.   Respiratory: Negative for cough and shortness of breath.    Cardiovascular: Negative for chest pain, palpitations and leg swelling.   Gastrointestinal: Negative for abdominal pain, constipation, diarrhea, nausea and vomiting.   Genitourinary: Negative for dysuria, frequency and hematuria.   Musculoskeletal: Negative for back pain, joint pain and myalgias.   Skin: Negative for rash.   Neurological: Positive for tingling (feet). Negative for tremors, weakness and headaches.   Endo/Heme/Allergies: Negative for polydipsia.   Psychiatric/Behavioral: Negative for depression. The patient is not nervous/anxious and does not have insomnia.         Objective:      Vital Signs: Ht 1.88 m (6' 2.02")    Wt 151.5 kg (334 lb)    BMI 42.86 kg/m   Physical Exam   Constitutional: He is oriented to person, place, and time and well-developed, well-nourished, and in no distress. No distress.   HENT:   Head: Normocephalic.   Eyes: Conjunctivae are normal.   Pulmonary/Chest: Effort normal.   Musculoskeletal:         General: Normal range of motion.      Cervical back: Normal range of motion and neck supple.   Neurological: He is alert and oriented to person, place, and time.        Skin: Skin is warm and dry.   Psychiatric: Mood, memory, affect and judgment normal.   Nursing note and  vitals reviewed.       Lab Review    Lab Results   Component Value Date    HGBA1CPERCNT 8.2 03/26/2018    HGBA1CPERCNT 10.7 10/06/2017    HGBA1C 10.8 (H) 11/05/2018    TSH 1.300 11/05/2018    CHOL 201 (H) 03/26/2018    HDL 47 03/26/2018    LDL 108 03/26/2018    TRIG 228 (H) 03/26/2018    ALT 24 12/26/2018    GLU 166 (H) 01/08/2019    K 4.2 01/08/2019    CA 8.7 01/08/2019    CREAT 1.50 (H) 05/24/2019    CO2 25 01/08/2019    EGFR 53 (L) 05/24/2019    NA 137 01/08/2019    ALKPHOS 67 12/26/2018          Impression and Recommendations:    1. Uncontrolled type 2 diabetes mellitus with hyperglycemia    2. Long term current use of insulin    3. Neuropathy    4. Mixed hyperlipidemia    -Continue Lantus to 26 units BID  -Increase  Humalog to 12 units TID before meals plus add correction of 2:50>150  -Continue Metformin to 1000 qAM and 500 mg qPM  -He want to try Ozempic again - 0.5 mg weekly injections ; new Rx sent  -Asked to check blood sugars 4-5 times a day and record and send log sheet and send to the office in 2 weeks.  Will evaluate and adjust insulin  -Reviewed signs and symptoms of hypoglycemia and how to treat; Rule of 15  -Reviewed the importance of proper foot care and need to inspect feet daily  -Does have neuropathy;  not on gabapentin  -Mild renal dysfunction; No microalbuminuria ; On ACEI which is renal protective   -Does have hyperlipidemia and on a statin (atorvastatin 40 mg)  and managed by PCP  -Does have HTN and managed by PCP.  BP 06/08/19 prior to cath was 117/76;  -Reviewed diabetic diet, importance of exercise and water intake  -Advised to walk 30 mins 5 days a week and after meals.   -Gets labs at PCP, [place].     Follow up in 3 month or sooner if needed     Counseling:    40 minutes spent with patient face to face, greater than 50% of the office visit was dedicated to counseling and coordination of care, reviewing tests & labs, treatment options and current plan, and follow up plans.    - Discussed with  patient importance of always bringing glucometer and log book to all visits  - Discussed about heart healthy diabetic diet and how this impacts diabetes management  - Discussed about importance of exercise and walking 30 mins 5 days a week (150 mins/week)  - Discussed importance of medication and diet compliance  - Keep up to date with annual eye exams and annual podiatry visits  - Advised to call the office if any issues with glucose management such as any hypoglycemic episodes with glucose <70 and/or persistently elevated glucose levels >200 over the next few days  - Discussed signs and symptoms of hypoglycemia and hyperglycemia and patient is aware on how to treat them.     Samuel Picket, FNP  Electronically Signed 06/09/2019

## 2019-06-14 ENCOUNTER — Encounter: Payer: Self-pay | Admitting: Internal Medicine

## 2019-06-14 NOTE — Addendum Note (Signed)
Encounter addended by: Adonis Brook, MD on: 06/14/2019 2:16 PM   Actions taken: Clinical Note Signed

## 2019-06-17 ENCOUNTER — Ambulatory Visit
Admission: RE | Admit: 2019-06-17 | Discharge: 2019-06-17 | Disposition: A | Discharge status: Home / Self Care | Payer: 59 | Source: Ambulatory Visit | Attending: Family | Admitting: Family

## 2019-06-17 ENCOUNTER — Encounter: Payer: Self-pay | Admitting: Internal Medicine

## 2019-06-17 VITALS — BP 110/74 | HR 83 | Resp 18 | Wt 341.0 lb

## 2019-06-17 DIAGNOSIS — I5042 Chronic combined systolic (congestive) and diastolic (congestive) heart failure: Secondary | ICD-10-CM

## 2019-06-17 DIAGNOSIS — E785 Hyperlipidemia, unspecified: Secondary | ICD-10-CM

## 2019-06-17 DIAGNOSIS — E1169 Type 2 diabetes mellitus with other specified complication: Secondary | ICD-10-CM

## 2019-06-17 DIAGNOSIS — R0600 Dyspnea, unspecified: Secondary | ICD-10-CM

## 2019-06-17 DIAGNOSIS — R0609 Other forms of dyspnea: Secondary | ICD-10-CM

## 2019-06-17 DIAGNOSIS — I428 Other cardiomyopathies: Secondary | ICD-10-CM

## 2019-06-17 DIAGNOSIS — I472 Ventricular tachycardia: Secondary | ICD-10-CM

## 2019-06-17 DIAGNOSIS — I4729 Other ventricular tachycardia: Secondary | ICD-10-CM

## 2019-06-17 DIAGNOSIS — I10 Essential (primary) hypertension: Secondary | ICD-10-CM

## 2019-06-17 DIAGNOSIS — I5032 Chronic diastolic (congestive) heart failure: Secondary | ICD-10-CM

## 2019-06-17 DIAGNOSIS — I493 Ventricular premature depolarization: Secondary | ICD-10-CM

## 2019-06-17 DIAGNOSIS — I495 Sick sinus syndrome: Secondary | ICD-10-CM

## 2019-06-17 DIAGNOSIS — R739 Hyperglycemia, unspecified: Secondary | ICD-10-CM

## 2019-06-17 DIAGNOSIS — N289 Disorder of kidney and ureter, unspecified: Secondary | ICD-10-CM

## 2019-06-17 MED ORDER — BUMETANIDE 2 MG PO TABS
ORAL_TABLET | ORAL | 3 refills | Status: DC
Start: 2019-06-17 — End: 2020-03-08

## 2019-06-17 NOTE — Progress Notes (Signed)
[facility]    I had the pleasure of seeing [name] today at [facility] for an advanced heart failure follow up.  Follows with Dr. [name] for cardiology.    Samuel Koch is a 49 y.o. male with nonischemic cardiomyopathy, chronic combined systolic and diastolic heart failure, latest EF 50-55%, NYHA Class 3, ACC/AHA Stage C.  Has dual-chamber pacemaker in place.  Status post PVC ablation.  Also uncontrolled type II diabetic, obesity.       He has been requiring IV diuretic pushes and up titration of oral diuretics at home.  He also does not do well when he takes trips, travels very often for his job.  Recently went to [place] gained over 10 pounds there prior to last visit.  Also been having occasional episodes of syncope when he laughs too hard but this is getting a bit better.    Despite significant work-up for his fatigue, still not a clear answer.  Thought to be from heart failure, but his weight gain is likely not fluid related.  He does have a poor diet especially he is on the road.    At last visit as well complaining more fatigue than ever.  Used to be able to go to the gym 5 days a week but now has no energy.  Device interrogation 04/20/2019 showed normal functioning device, frequent episodes of SVT, sometimes up to 158 bpm.    We have optimized his beta-blocker, at last visit gave IV Bumex and increase oral Bumex at home 6 mg twice a day and increase potassium.  Also set up with a right heart cath which showed that he has no significant volume overload, wedge of 16 cardiac index 2.2.        Review of Systems       Comprehensive review of systems performed by me. Unless otherwise noted, all systems are negative.      Most recent laboratory testing reveals:   LABORATORY DATA:  Lab Results   Component Value Date    BUN 16 01/08/2019    NA 137 01/08/2019    K 4.2 01/08/2019    CL 105 01/08/2019    CO2 25 01/08/2019     Lab  Results   Component Value Date    WBC 4.4 01/08/2019    HGB 13.2 01/08/2019    HCT 39.5 01/08/2019    MCV 92 01/08/2019    PLT 207 01/08/2019     Results     ** No results found for the last 24 hours. **            Medical Problems:  Patient Active Problem List    Diagnosis Date Noted    Chronic diastolic HF (heart failure) 06/08/2019    Adjustment and management of cardiac pacemaker 07/08/2018    Class 2 severe obesity with serious comorbidity and body mass index (BMI) of 39.0 to 39.9 in adult, unspecified obesity type 07/08/2018    SSS (sick sinus syndrome) 07/08/2018    PVC (premature ventricular contraction) 07/08/2018    Chest pain 03/26/2018    NICM (nonischemic cardiomyopathy) 10/20/2017    Chronic combined systolic and diastolic HF (heart failure) 10/20/2017    Obesity, morbid, BMI 40.0-49.9 10/07/2017    DOE (dyspnea on exertion) 10/06/2017    Hyperglycemia 03/29/2017    Essential hypertension 03/29/2017    Renal insufficiency 03/29/2017    NSVT (nonsustained ventricular tachycardia) 08/17/2015    DM  type 2 with diabetic dyslipidemia 07/31/2015       History from Cardiology at [facility] [place]  1. Chronic combined systolic and diastolic CHF    2. Nonischemic cardiomyopathy  A. LHC (6/17): 30% mid RCA, no other significant disease.   B. Echo (6/17): EF 30-35%.   C. Holter (7/17): 19% PVCs.   D. Echo (3/18): EF 50-55%, moderate LVH, grade II diastolic dysfunction.   E. Echo (9/18): EF 50-55%  F. Echo 10/07/17- LV cavity size is increased. The LVEF is est. at 50%. Mild hypokinesis of the distal septum and apex which may be due to paced rhythm. Pacer wire present in RV.   G. Echo 06/12/2018- Mild concentric LVH, EF 50-55%, aortic root is mildy dilated, aortic root is 3.9cm, tricuspid regurgitation is mild, RSVP .     3. Type II diabetes    4. Hypertriglyceridemia    5. PVCs  A. 48 hour holter (7/17) with 19% PVCs.  B. PVC ablation in 8/17 but PVCs recurred. Will   C. Holter (11/17) with rare  PVCs, < 1%.   D. Holter (9/18) with rare PVCs only    6. Bradycardia/ SSS  A. Dual chamber pacemaker placed (Medtronic, MRI compatible).     7. NSVT    8. CAD evaluation   A. Echo 03/27/2018- EF 50%, no regional wall motion abnormalities present, grade 2 DD.  no significant valvular heart disease.   B. Stress 03/28/2018- Completed vasodilator protocol., No ischemic changes with infusion, No arrhythmias,low risk. Perfusion defect noted thought to be diaphragmatic attenuation  Dilated LV with mildly decreased left ventricular function, stress EF 44%.   C. THC 03/30/2018- Non obstructive cad.  CO 5 CI 2.0 wedge 14 mean PA 22    Past Medical History:   Diagnosis Date    Cardiomyopathy     Congestive heart failure     Diabetes mellitus     Hypertension     PVC's (premature ventricular contractions)      Past Surgical History:   Procedure Laterality Date    APPENDECTOMY      CARDIAC PACEMAKER PLACEMENT      RIGHT & LEFT HEART CATH  W/ CORONARY ANGIOS, LV/LA Right 03/30/2018    Procedure: RIGHT & LEFT HEART CATH  W/ CORONARY ANGIOS, LV/LA;  Surgeon: Santo Held, MD;  Location: [facility] CATH/EP;  Service: Cardiovascular;  Laterality: Right;    RIGHT HEART CATH Right 06/08/2019    Procedure: RIGHT HEART CATH;  Surgeon: Adonis Brook, MD;  Location: [facility] CATH/EP;  Service: Cardiovascular;  Laterality: Right;  ARRIVAL TIME     Family History   Problem Relation Age of Onset    Heart disease Father     Hypertension Father     Heart disease Sister     Heart attack Sister     Heart disease Brother     Diabetes Brother     Diabetes Paternal Grandmother      Social History     Socioeconomic History    Marital status: Married     Spouse name: Not on file    Number of children: Not on file    Years of education: Not on file    Highest education level: Bachelor's degree (e.g., BA, AB, BS)   Occupational History    Not on file   Tobacco Use    Smoking status: Former Smoker    Smokeless tobacco: Former Neurosurgeon     Tobacco comment: when 49 years  old   Substance and Sexual Activity    Alcohol use: No    Drug use: No    Sexual activity: Not on file     Comment: Deferred   Other Topics Concern    Not on file   Social History Narrative    Not on file     Social Determinants of Health     Financial Resource Strain: Medium Risk    Difficulty of Paying Living Expenses: Somewhat hard   Food Insecurity: No Food Insecurity    Worried About Running Out of Food in the Last Year: Never true    Ran Out of Food in the Last Year: Never true   Transportation Needs: No Transportation Needs    Lack of Transportation (Medical): No    Lack of Transportation (Non-Medical): No   Physical Activity:     Days of Exercise per Week:     Minutes of Exercise per Session:    Stress:     Feeling of Stress :    Social Connections:     Frequency of Communication with Friends and Family:     Frequency of Social Gatherings with Friends and Family:     Attends Religious Services:     Active Member of Clubs or Organizations:     Attends Engineer, structural:     Marital Status:    Intimate Partner Violence:     Fear of Current or Ex-Partner:     Emotionally Abused:     Physically Abused:     Sexually Abused:      Married   Non smoker - isolated smoking before age 66  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 270 tablet 3    Continuous Blood Gluc Sensor (FreeStyle Libre 2  Sensor Systm) Misc 1 Device by Does not apply route every 14 (fourteen) days 2 each 12    glucose blood test strip Check BS 5 times daily 150 each 11    insulin glargine (LANTUS) 100 UNIT/ML injection Inject 24 Units into the skin every 12 (twelve) hours 45 mL 3    insulin lispro, 1 Unit Dial, (HumaLOG KwikPen) 100 UNIT/ML Solution Pen-injector injection pen 10 unit TID pre meals plus sliding scale 2:50>150 with max of 60 units per day (Patient taking differently: 15 units TID pre meals plus sliding scale 2:50>150 with max of 60 units per day  ) 54 mL 3    Insulin Pen Needle (PEN NEEDLES 3/16") 31G X 5 MM Misc 1 each by Does not apply route 4 (four) times daily 901 each 1    Lancets Ultra Fine Misc Check BS 5 times daily 150 each 11    lisinopril (ZESTRIL) 10 MG tablet Take 5 mg by mouth daily        metFORMIN (GLUCOPHAGE) 500 MG tablet 2 tablets (  1000 mg) with breakfast and 1 tablets (500 mg) with dinner 270 tablet 3    metoprolol succinate (Toprol XL) 200 MG 24 hr tablet Take 1 tablet (200 mg total) by mouth daily 90 tablet 2    potassium chloride (KLOR-CON) 10 MEQ tablet Take 40 mEq by mouth daily        Semaglutide,0.25 or 0.5MG /DOS, (Ozempic, 0.25 or 0.5 MG/DOSE,) 2 MG/1.5ML Solution Pen-injector Inject 0.5 mg into the skin once a week 3 mL 3    spironolactone (ALDACTONE) 25 MG tablet Take 25 mg by mouth daily       No current facility-administered medications for this encounter.     No Known Allergies    Vitals:    Vitals:    06/17/19 1420   BP: 110/74   Pulse: 83   Resp: 18   SpO2: 96%       Physical Exam:   General: overweight male, alert, NAD  HEENT:  Anicteric sclera, supple  Lungs: diminished bases, no crackles or rails.   Cardiovascular: Moderate JVD, s1s2, normal rate, regular rhythm, no m/r/g  Abdominal: obese/distended, nontender  Extremities: warm to touch, trace edema  Neuromuscular exam: grossly non-focal, though not formally tested.    Assessment and Plan:  1. Hyperglycemia     2.  Essential hypertension     3. Renal insufficiency     4. DOE (dyspnea on exertion)     5. Obesity, morbid, BMI 40.0-49.9     6. NICM (nonischemic cardiomyopathy)     7. Chronic combined systolic and diastolic HF (heart failure)     8. SSS (sick sinus syndrome)     9. PVC (premature ventricular contraction)     10. NSVT (nonsustained ventricular tachycardia)     11. DM type 2 with diabetic dyslipidemia     12. Chronic diastolic HF (heart failure)         Cardiomyopathy:   My impression is that, Samuel Koch has nonischemic cardiomyopathy, chronic systolic/diastolic heart failure ACC/AHA stage C, class III with improved EF.  Did have excessive PVC status post ablation in the past.  Remains on guideline directed heart failure medical therapy.    Unclear why patient remains with fatigue.  Likely multifactorial.  Currently not decompensated, recent right heart cath with normal wedge.  He is overweight, 1 component may certainly be deconditioning.  Additionally, fatigue may be secondary to beta-blocker side effect.  And also high degree of PVC burden such as in the past for which she required PVC ablation.    Recommend reengaging in exercise regiment.  Recommend he follow with the EP to discuss potential repeat PVC ablation.  For now we will continue beta-blocker.      In regards to OMM:  Beta-blocker: toprol XL  ACE-I/ARB/ARNI: yes  Aldosterone antagonist: yes  Hydralazine/nitrate: no   Digoxin: no    Medication changes:  Decrease Lisinopril to 5mg  daily  Decrease Bumex to 2 pills twice a day.        Electronically signed by:  Faythe Ghee, MD  [facility]  [address], [address]  [place], [place] [address]  [phone]    [facility] - [facility]  [address]  [place], [place] [address]  [phone]

## 2019-06-17 NOTE — Patient Instructions (Addendum)
Decrease Lisinopril to 5mg  daily  Decrease Bumex to 2 pills twice a day.  Follow up with EP.

## 2019-06-18 ENCOUNTER — Telehealth: Payer: Self-pay

## 2019-06-18 NOTE — Telephone Encounter (Signed)
Requested ov, labs from pcp

## 2019-06-22 ENCOUNTER — Ambulatory Visit: Payer: Self-pay | Admitting: Internal Medicine

## 2019-06-22 NOTE — Telephone Encounter (Signed)
2ND REQUEST

## 2019-06-24 ENCOUNTER — Ambulatory Visit: Payer: 59 | Admitting: Internal Medicine

## 2019-06-24 ENCOUNTER — Encounter: Payer: Self-pay | Admitting: Internal Medicine

## 2019-06-24 VITALS — BP 148/90 | HR 85 | Ht 74.0 in | Wt 339.0 lb

## 2019-06-24 DIAGNOSIS — I5042 Chronic combined systolic (congestive) and diastolic (congestive) heart failure: Secondary | ICD-10-CM

## 2019-06-24 DIAGNOSIS — M6281 Muscle weakness (generalized): Secondary | ICD-10-CM

## 2019-06-24 DIAGNOSIS — I493 Ventricular premature depolarization: Secondary | ICD-10-CM

## 2019-06-24 DIAGNOSIS — I495 Sick sinus syndrome: Secondary | ICD-10-CM

## 2019-06-24 DIAGNOSIS — R0602 Shortness of breath: Secondary | ICD-10-CM

## 2019-06-24 MED ORDER — DILTIAZEM HCL ER COATED BEADS 120 MG PO CP24
120.00 mg | ORAL_CAPSULE | Freq: Every day | ORAL | 1 refills | Status: DC
Start: 2019-06-24 — End: 2019-10-29

## 2019-06-24 NOTE — Progress Notes (Deleted)
Cardiology Follow-Up      Patient Name: Samuel Koch   Date of Birth: 15-Nov-1970    Provider: Abel Presto, DO     Patient Care Team:  Samuel Gaw, MD as PCP - General (Family Medicine)  Samuel Presto, DO as Consulting Physician (Cardiology)  Samuel Squires, NP as Nurse Practitioner (Family Nurse Practitioner)  Samuel Roach, PA as Physician Assistant (Physician Assistant)    Chief Complaint: No chief complaint on file.      Impression      There are no diagnoses linked to this encounter.    Plan           History of Present Illness   Samuel Koch is a 49 y.o. male being seen today for ***    He has a history chronic combined systolic and diastolic CHF, NICM, bradycardia, SSS, acute coronary syndrome, type II diabetes, PVCs, and NSVT. S/p dual chamber pacemaker implant in 2017. S/p PVC ablation in 2017. His echo on 06/12/18 demonstrated EF 50-55% with a mildly dilated aortic root at 3.9 cm and mild tricuspid regurgitation.      Past Medical History     Past Medical History 06/24/19- DVA/MB     LOV 07/08/18 Samuel Koch), 05/11/18 (DVA)   Labs in epic     1. Chronic combined systolic and diastolic CHF   A. RHC 06/08/19- results??     2. Nonischemic cardiomyopathy   A. LHC (6/17): 30% mid RCA, no other significant disease.   B. Echo (6/17): EF 30-35%.   C. Holter (7/17): 19% PVCs.   D. Echo (3/18): EF 50-55%, moderate LVH, grade II diastolic dysfunction.   E. Echo (9/18): EF 50-55%.   F. Echo 10/07/17- LV cavity size is increased. The LVEF is est. at 50%. Mild hypokinesis of the distal septum and apex which may be due to paced rhythm. Pacer wire present in RV.   G. Echo 05/22/18- EF 50-55%. Grade II DD. Mitral valve leaflets mildly thickened without stenosis. Trace MR. Trace TR. Estimated pulmonary arterial pressure is 25-30 mmHg.   H. Echo 06/12/2018- Mild concentric LVH, EF 50-55%, aortic root is mildy dilated, aortic root is 3.9cm, tricuspid regurgitation is mild, RSVP .     3. Acute Coronary Syndrome   A. Echo  03/27/2018- Technically difficult study with limited endocardial definition and structural resolution in the apical views, Intravenous contrast was administered to enhance endocardial definition, LV cavity size is norma, mild concentric left ventricular hypertrophy, LV systolic function is at the lower limits of normal, EF 50%, no regional wall motion abnormalities present, LV diastolic filling pattern is probably pseudonormalized, consistent with grade 2 left ventricular diastolic dysfunction and elevated left atrial pressure, Right-sided chambers are not optimally visualized but appear grossly normal in size, Pacemaker lead is visualized within the right heart chamber, no significant valvular heart disease.   B. Stress 03/28/2018- Completed vasodilator protocol., No ischemic changes with infusion, No arrhythmias, Overall, low risk vasodilator stress rest SPECT perfusion study, a large moderate to severe perfusion defect involving the apex, inferior stripe, mid and basal inferoseptal and inferolateral segments with mild reversibility suggestive of possible ischemia but with likely strong component of diaphragmatic attenuation artifact, Also anteroseptal perfusion defect from attenuation artifact, No significant findings of ischemia Noted, Dilated LV with mildly decreased left ventricular function, stress EF 44%.   C. THC 03/30/2018- Negative heart cath. False positive stress test.     4. Bradycardia/ SSS   A. Dual chamber pacemaker  placed 09/20/15 (Medtronic, MRI compatible). [facility])     5. PVCs   A. 48 hour holter (7/17) with 19% PVCs.   B. PVC ablation in 8/17 but PVCs recurred.   C. Holter (11/17) with rare PVCs, < 1%.   D. Holter (9/18) with rare PVCs only.     6. NSVT   7. Type II diabetes   8. Hypertriglyceridemia     He has a history chronic combined systolic and diastolic CHF, NICM, bradycardia, SSS, acute coronary syndrome, type II diabetes, PVCs, and NSVT. S/p dual chamber pacemaker implant in 2017. S/p  PVC ablation in 2017. His echo on 06/12/18 demonstrated EF 50-55% with a mildly dilated aortic root at 3.9 cm and mild tricuspid regurgitation.    Allergies     has No Known Allergies.    Medications       Current Outpatient Medications:     atorvastatin (LIPITOR) 20 MG tablet, TAKE 1 TABLET BY MOUTH ONCE DAILY AT NIGHT, Disp: 90 tablet, Rfl: 3    Blood Glucose Monitor, generic, Kit, Use as directed to monitor blood sugar, Disp: 1 kit, Rfl: 1    Blood Glucose Monitoring Suppl (OneTouch Ultra 2) w/Device Kit, USE AS DIRECTED TO MONITOR BLOOD SUGAR, Disp: , Rfl:     bumetanide (BUMEX) 2 MG tablet, Take 2 tablets (4 mg total) by mouth every morning AND 2 tablets (4 mg total) Daily after lunch., Disp: 270 tablet, Rfl: 3    Continuous Blood Gluc Sensor (FreeStyle Libre 2 Sensor Systm) Misc, 1 Device by Does not apply route every 14 (fourteen) days, Disp: 2 each, Rfl: 12    glucose blood test strip, Check BS 5 times daily, Disp: 150 each, Rfl: 11    insulin glargine (LANTUS) 100 UNIT/ML injection, Inject 24 Units into the skin every 12 (twelve) hours, Disp: 45 mL, Rfl: 3    insulin lispro, 1 Unit Dial, (HumaLOG KwikPen) 100 UNIT/ML Solution Pen-injector injection pen, 10 unit TID pre meals plus sliding scale 2:50>150 with max of 60 units per day (Patient taking differently: 15 units TID pre meals plus sliding scale 2:50>150 with max of 60 units per day ), Disp: 54 mL, Rfl: 3    Insulin Pen Needle (PEN NEEDLES 3/16") 31G X 5 MM Misc, 1 each by Does not apply route 4 (four) times daily, Disp: 901 each, Rfl: 1    Lancets Ultra Fine Misc, Check BS 5 times daily, Disp: 150 each, Rfl: 11    lisinopril (ZESTRIL) 10 MG tablet, Take 5 mg by mouth daily , Disp: , Rfl:     metFORMIN (GLUCOPHAGE) 500 MG tablet, 2 tablets (1000 mg) with breakfast and 1 tablets (500 mg) with dinner, Disp: 270 tablet, Rfl: 3    metoprolol succinate (Toprol XL) 200 MG 24 hr tablet, Take 1 tablet (200 mg total) by mouth daily, Disp: 90 tablet,  Rfl: 2    potassium chloride (KLOR-CON) 10 MEQ tablet, Take 40 mEq by mouth daily , Disp: , Rfl:     Semaglutide,0.25 or 0.5MG /DOS, (Ozempic, 0.25 or 0.5 MG/DOSE,) 2 MG/1.5ML Solution Pen-injector, Inject 0.5 mg into the skin once a week, Disp: 3 mL, Rfl: 3    spironolactone (ALDACTONE) 25 MG tablet, Take 25 mg by mouth daily, Disp: , Rfl:     Review of Systems   Constitution: Negative for activity change, appetite change, fatigue, fever, and unexpected weight change  HENT: Negative for trouble swallowing, and voice change  Eyes: Negative for any visual disturbances  Respiratory: Negative for apnea and/or awakening with sob, chest tightness, and sob  Cardiovascular: Negative for chest pain/discomfort, leg or ankle swelling, difficulty breathing lying down, and palpitations    Comprehensive review of systems performed by me. Unless otherwise noted, all systems are negative.      Physical Exam   There were no vitals taken for this visit.  There were no vitals filed for this visit.  Wt Readings from Last 3 Encounters:   06/17/19 154.7 kg (341 lb)   06/09/19 151.5 kg (334 lb)   06/08/19 149.7 kg (330 lb)        Constitutional -  Well appearing, and in no distress  Respiratory - Clear to auscultation bilaterally, normal respiratory effort    Cardiovascular system -    Regular rate and rhythm    Normal S1, S2    No murmurs, rubs, gallops   Jugular venous pulse is normal        Carotid upstroke normal, no carotid bruits auscultated   2+ pulses in the posterior tibial / dorsalis pedis bilaterally    Neurological - Alert, oriented, no focal neurological deficits  Extremities -  No clubbing or cyanosis. No peripheral edema    Labs     Lab Results   Component Value Date/Time    WBC 4.4 01/08/2019 11:31 PM    RBC 4.31 01/08/2019 11:31 PM    HGB 13.2 01/08/2019 11:31 PM    HCT 39.5 01/08/2019 11:31 PM    PLT 207 01/08/2019 11:31 PM    TSH 1.300 11/05/2018 12:00 AM       Lab Results   Component Value Date/Time    NA 137  01/08/2019 11:31 PM    K 4.2 01/08/2019 11:31 PM    CL 105 01/08/2019 11:31 PM    CO2 25 01/08/2019 11:31 PM    GLU 166 (H) 01/08/2019 11:31 PM    BUN 16 01/08/2019 11:31 PM    CREAT 1.50 (H) 05/24/2019 02:29 PM    CREAT 1.05 01/08/2019 11:31 PM    PROT 7.1 12/26/2018 06:20 PM    ALKPHOS 67 12/26/2018 06:20 PM    AST 15 12/26/2018 06:20 PM    ALT 24 12/26/2018 06:20 PM       Lab Results   Component Value Date/Time    CHOL 201 (H) 03/26/2018 04:24 PM    TRIG 228 (H) 03/26/2018 04:24 PM    HDL 47 03/26/2018 04:24 PM    LDL 108 03/26/2018 04:24 PM       Lab Results   Component Value Date/Time    HGBA1CPERCNT 8.2 03/26/2018 04:24 PM       Cardiogenics:     EKG:           This note was scribed by Grover Canavan on behalf of Samuel Presto, DO    Electronically signed by:   Samuel Presto, DO  06/24/2019

## 2019-06-24 NOTE — Progress Notes (Signed)
Cardiology Follow-Up      Patient Name: Samuel Koch   Date of Birth: 1971/01/17    Provider: Abel Presto, DO     Patient Care Team:  Tandy Gaw, MD as PCP - General (Family Medicine)  Abel Presto, DO as Consulting Physician (Cardiology)  Elizbeth Squires, NP as Nurse Practitioner (Family Nurse Practitioner)  Joneen Roach, PA as Physician Assistant (Physician Assistant)    Chief Complaint: PVC (per Dr. Mellody Drown, pt needs to be seen for PVC's)      Impression      1. PVC (premature ventricular contraction)  - Office - Holter monitor - 48 hour; Future  - Office - Echo (TTE); Future    2. SOB (shortness of breath)  - Office - Echo (TTE); Future    3. Chronic combined systolic and diastolic HF (heart failure)    4. SSS (sick sinus syndrome)    5. Muscle weakness      Plan     1. Evaluation for chronic combined systolic and diastolic CHF. He had a right heart catheterization on 5/4/21y Dr. Mellody Drown that demonstrated overall normal pressures and a normal cardiac index of 2.2, not indicative of heart failure. His previous echocardiogram showed an EF of around 20-30% prior to initiation of therapies. His recent echocardiogram on 06/12/18 demonstrated an EF of 50-55%. Continue to monitor.    2. Sinus node dysfunction. S/p dual chamber pacemaker. He is 50% atrial paced. Continue with regular device checks.     3. PVCs. S/p PVC ablation in 2017. Last device check showed a very low burden of PVCs. However he had recurrent episodes of SVT with rates up to 158 bpm. At this time I would like to obtain a 48 hour holter monitor to assess current PVC burden. However I do not think the PVCs are the cause of the fatigue. Follow up on results. I will start him on 120 mg of diltiazem daily to see if this controls his episodes of SVT. He may discontinue if he is having side effects. Repeat echocardiogram to assess LV function.    4. Fatigue/muscle weakness. Significantly improved after self-discontinuation of  metoprolol. I will talk to Dr. [name] regarding how to proceed. There is broad differential including primary neurologic disorders, autoimmune musculoskeletal disorders and possibly increased PVC burden however pacemaker checks do not suggest this.     I will see him back in 4 months.     History of Present Illness   Samuel Koch is a 49 y.o. male being seen today for fatigue.    He has a history chronic combined systolic and diastolic CHF, NICM, bradycardia, SSS, acute coronary syndrome, type II diabetes, PVCs, and NSVT. S/p dual chamber pacemaker implant in 2017. S/p PVC ablation in 2017. His echo on 06/12/18 demonstrated EF 50-55% with a mildly dilated aortic root at 3.9 cm and mild tricuspid regurgitation.    He states that he recently self-discontinued the metoprolol due to extreme fatigue and shortness of breath. Last week he could not walk to the mailbox or complete normal tasks, due to extreme fatigue, which is very abnormal for him. His wife tells me that he is choosing shoes based on how light they are because he is not able to carry heavy shoes.     Today he is feeling much better than he did last week. He thinks that he was taking too much metoprolol. He has tried other beta blocker therapies however those caused side effects  as well. He still has weakness in his legs and trouble going up and down the stairs, but improved since last week. He had lab work for Lyme disease and thyroid function already, which were normal.    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     Past Medical History     Past Medical History 06/24/19- DVA/MB     LOV 07/08/18 Samuel Koch), 05/11/18 (DVA)   Labs in epic     1. Chronic combined systolic and diastolic CHF   A. RHC 06/08/19- results??     2. Nonischemic cardiomyopathy   A. LHC (6/17): 30% mid RCA, no other significant disease.   B. Echo (6/17): EF 30-35%.   C. Holter (7/17): 19% PVCs.   D. Echo (3/18): EF 50-55%, moderate LVH, grade II diastolic dysfunction.   E. Echo  (9/18): EF 50-55%.   F. Echo 10/07/17- LV cavity size is increased. The LVEF is est. at 50%. Mild hypokinesis of the distal septum and apex which may be due to paced rhythm. Pacer wire present in RV.   G. Echo 05/22/18- EF 50-55%. Grade II DD. Mitral valve leaflets mildly thickened without stenosis. Trace MR. Trace TR. Estimated pulmonary arterial pressure is 25-30 mmHg.   H. Echo 06/12/2018- Mild concentric LVH, EF 50-55%, aortic root is mildy dilated, aortic root is 3.9cm, tricuspid regurgitation is mild, RSVP .     3. Acute Coronary Syndrome   A. Echo 03/27/2018- Technically difficult study with limited endocardial definition and structural resolution in the apical views, Intravenous contrast was administered to enhance endocardial definition, LV cavity size is norma, mild concentric left ventricular hypertrophy, LV systolic function is at the lower limits of normal, EF 50%, no regional wall motion abnormalities present, LV diastolic filling pattern is probably pseudonormalized, consistent with grade 2 left ventricular diastolic dysfunction and elevated left atrial pressure, Right-sided chambers are not optimally visualized but appear grossly normal in size, Pacemaker lead is visualized within the right heart chamber, no significant valvular heart disease.   B. Stress 03/28/2018- Completed vasodilator protocol., No ischemic changes with infusion, No arrhythmias, Overall, low risk vasodilator stress rest SPECT perfusion study, a large moderate to severe perfusion defect involving the apex, inferior stripe, mid and basal inferoseptal and inferolateral segments with mild reversibility suggestive of possible ischemia but with likely strong component of diaphragmatic attenuation artifact, Also anteroseptal perfusion defect from attenuation artifact, No significant findings of ischemia Noted, Dilated LV with mildly decreased left ventricular function, stress EF 44%.   C. THC 03/30/2018- Negative heart cath. False  positive stress test.     4. Bradycardia/ SSS   A. 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 [facility])     5. PVCs   A. 48 hour holter (7/17) with 19% PVCs.   B. PVC ablation in 8/17 but PVCs recurred.   C. Holter (11/17) with rare PVCs, < 1%.   D. Holter (9/18) with rare PVCs only.     6. NSVT   7. Type II diabetes   8. Hypertriglyceridemia     He has a history chronic combined systolic and diastolic CHF, NICM, bradycardia, SSS, acute coronary syndrome, type II diabetes, PVCs, and NSVT. S/p dual chamber pacemaker implant in 2017. S/p PVC ablation in 2017. His echo on 06/12/18 demonstrated EF 50-55% with a mildly dilated aortic root at 3.9 cm and mild tricuspid regurgitation.    Allergies     has No Known Allergies.    Medications  Current Outpatient Medications:     atorvastatin (LIPITOR) 20 MG tablet, TAKE 1 TABLET BY MOUTH ONCE DAILY AT NIGHT, Disp: 90 tablet, Rfl: 3    Blood Glucose Monitor, generic, Kit, Use as directed to monitor blood sugar, Disp: 1 kit, Rfl: 1    Blood Glucose Monitoring Suppl (OneTouch Ultra 2) w/Device Kit, USE AS DIRECTED TO MONITOR BLOOD SUGAR, Disp: , Rfl:     bumetanide (BUMEX) 2 MG tablet, Take 2 tablets (4 mg total) by mouth every morning AND 2 tablets (4 mg total) Daily after lunch., Disp: 270 tablet, Rfl: 3    Continuous Blood Gluc Sensor (FreeStyle Libre 2 Sensor Systm) Misc, 1 Device by Does not apply route every 14 (fourteen) days, Disp: 2 each, Rfl: 12    glucose blood test strip, Check BS 5 times daily, Disp: 150 each, Rfl: 11    insulin glargine (LANTUS) 100 UNIT/ML injection, Inject 24 Units into the skin every 12 (twelve) hours (Patient taking differently: Inject 30 Units into the skin every 12 (twelve) hours  ), Disp: 45 mL, Rfl: 3    insulin lispro, 1 Unit Dial, (HumaLOG KwikPen) 100 UNIT/ML Solution Pen-injector injection pen, 10 unit TID pre meals plus sliding scale 2:50>150 with max of 60 units per day (Patient taking  differently: 15 units TID pre meals plus sliding scale 2:50>150 with max of 60 units per day ), Disp: 54 mL, Rfl: 3    Insulin Pen Needle (PEN NEEDLES 3/16") 31G X 5 MM Misc, 1 each by Does not apply route 4 (four) times daily, Disp: 901 each, Rfl: 1    Lancets Ultra Fine Misc, Check BS 5 times daily, Disp: 150 each, Rfl: 11    lisinopril (ZESTRIL) 10 MG tablet, Take 5 mg by mouth daily , Disp: , Rfl:     metFORMIN (GLUCOPHAGE) 500 MG tablet, 2 tablets (1000 mg) with breakfast and 1 tablets (500 mg) with dinner, Disp: 270 tablet, Rfl: 3    potassium chloride (KLOR-CON) 10 MEQ tablet, Take 10 mEq by mouth daily  , Disp: , Rfl:     Semaglutide,0.25 or 0.5MG /DOS, (Ozempic, 0.25 or 0.5 MG/DOSE,) 2 MG/1.5ML Solution Pen-injector, Inject 0.5 mg into the skin once a week, Disp: 3 mL, Rfl: 3    spironolactone (ALDACTONE) 25 MG tablet, Take 25 mg by mouth daily, Disp: , Rfl:     VITAMIN D PO, Take by mouth daily, Disp: , Rfl:     dilTIAZem (Cardizem CD) 120 MG 24 hr capsule, Take 1 capsule (120 mg total) by mouth daily, Disp: 30 capsule, Rfl: 1    Review of Systems   Constitution: Negative for activity change, appetite change, fatigue, fever, and unexpected weight change  HENT: Negative for trouble swallowing, and voice change  Eyes: Negative for any visual disturbances  Respiratory: Negative for apnea and/or awakening with sob, chest tightness, and sob  Cardiovascular: Negative for chest pain/discomfort, leg or ankle swelling, difficulty breathing lying down, and palpitations    Comprehensive review of systems performed by me. Unless otherwise noted, all systems are negative.      Physical Exam     Visit Vitals  BP 148/90 (BP Site: Left arm, Patient Position: Sitting)   Pulse 85   Ht 1.88 m (6\' 2" )   Wt 153.8 kg (339 lb)   BMI 43.53 kg/m     Vitals:    06/24/19 1617   BP: 148/90   BP Site: Left arm   Patient Position: Sitting   Pulse:  85   Weight: 153.8 kg (339 lb)   Height: 1.88 m (6\' 2" )     Wt Readings from  Last 3 Encounters:   06/24/19 153.8 kg (339 lb)   06/17/19 154.7 kg (341 lb)   06/09/19 151.5 kg (334 lb)        Constitutional -  Well appearing, and in no distress  Respiratory - Clear to auscultation bilaterally, normal respiratory effort    Cardiovascular system -    Regular rate and rhythm    Normal S1, S2    No murmurs, rubs, gallops   Jugular venous pulse is normal        Carotid upstroke normal, no carotid bruits auscultated   2+ pulses in the posterior tibial / dorsalis pedis bilaterally    Neurological - Alert, oriented, no focal neurological deficits  Extremities -  No clubbing or cyanosis. No peripheral edema    Labs     Lab Results   Component Value Date/Time    WBC 4.4 01/08/2019 11:31 PM    RBC 4.31 01/08/2019 11:31 PM    HGB 13.2 01/08/2019 11:31 PM    HCT 39.5 01/08/2019 11:31 PM    PLT 207 01/08/2019 11:31 PM    TSH 1.300 11/05/2018 12:00 AM       Lab Results   Component Value Date/Time    NA 137 01/08/2019 11:31 PM    K 4.2 01/08/2019 11:31 PM    CL 105 01/08/2019 11:31 PM    CO2 25 01/08/2019 11:31 PM    GLU 166 (H) 01/08/2019 11:31 PM    BUN 16 01/08/2019 11:31 PM    CREAT 1.50 (H) 05/24/2019 02:29 PM    CREAT 1.05 01/08/2019 11:31 PM    PROT 7.1 12/26/2018 06:20 PM    ALKPHOS 67 12/26/2018 06:20 PM    AST 15 12/26/2018 06:20 PM    ALT 24 12/26/2018 06:20 PM       Lab Results   Component Value Date/Time    CHOL 201 (H) 03/26/2018 04:24 PM    TRIG 228 (H) 03/26/2018 04:24 PM    HDL 47 03/26/2018 04:24 PM    LDL 108 03/26/2018 04:24 PM       Lab Results   Component Value Date/Time    HGBA1CPERCNT 8.2 03/26/2018 04:24 PM       Cardiogenics:     EKG: sinus rhythm           This note was scribed by Abel Presto on behalf of Abel Presto, DO    Electronically signed by:   Abel Presto, DO  07/04/2019

## 2019-07-07 NOTE — Progress Notes (Signed)
Echo 06/12/2018  [facility]      Interpretation Summary:  Normal LV size, LV EF is low normal. Mild concentric LVH. EF=50-55%. Aortic root is mildy dilated, aortic root is 3.9cm, tricuspid regurgitation is mild, RSVP . Pacer wire present in RV.

## 2019-07-08 ENCOUNTER — Other Ambulatory Visit: Payer: 59

## 2019-07-12 ENCOUNTER — Other Ambulatory Visit: Payer: 59

## 2019-07-21 ENCOUNTER — Other Ambulatory Visit: Payer: Self-pay | Admitting: Family Nurse Practitioner

## 2019-07-29 ENCOUNTER — Telehealth: Payer: Self-pay | Admitting: Family Nurse Practitioner

## 2019-07-29 ENCOUNTER — Ambulatory Visit: Payer: 59 | Admitting: Family Nurse Practitioner

## 2019-07-29 NOTE — Telephone Encounter (Signed)
Canceled appointment.

## 2019-08-05 ENCOUNTER — Telehealth: Payer: Self-pay

## 2019-08-05 ENCOUNTER — Ambulatory Visit: Payer: 59 | Admitting: Internal Medicine

## 2019-08-05 NOTE — Telephone Encounter (Signed)
Rescheduled

## 2019-08-05 NOTE — Telephone Encounter (Signed)
Pt wife calling to get him scheduled for echo and heart monitor

## 2019-08-06 ENCOUNTER — Ambulatory Visit: Payer: 59

## 2019-08-06 DIAGNOSIS — I493 Ventricular premature depolarization: Secondary | ICD-10-CM

## 2019-08-13 ENCOUNTER — Other Ambulatory Visit: Payer: Self-pay | Admitting: Internal Medicine

## 2019-08-13 ENCOUNTER — Ambulatory Visit: Payer: 59

## 2019-08-13 DIAGNOSIS — R0602 Shortness of breath: Secondary | ICD-10-CM

## 2019-08-13 DIAGNOSIS — I493 Ventricular premature depolarization: Secondary | ICD-10-CM

## 2019-08-13 MED ORDER — PERFLUTREN LIPID MICROSPHERE 6.52 MG/ML IV SUSP
1.50 mL | Freq: Once | INTRAVENOUS | Status: AC
Start: 2019-08-13 — End: 2019-08-13
  Administered 2019-08-13: 1.5 mL via INTRAVENOUS

## 2019-08-23 ENCOUNTER — Other Ambulatory Visit (INDEPENDENT_AMBULATORY_CARE_PROVIDER_SITE_OTHER): Payer: Self-pay

## 2019-08-23 DIAGNOSIS — E1165 Type 2 diabetes mellitus with hyperglycemia: Secondary | ICD-10-CM

## 2019-08-23 MED ORDER — INSULIN LISPRO (1 UNIT DIAL) 100 UNIT/ML SC SOPN
PEN_INJECTOR | SUBCUTANEOUS | 3 refills | Status: DC
Start: 2019-08-23 — End: 2020-03-30

## 2019-08-23 MED ORDER — INSULIN GLARGINE 100 UNIT/ML SC SOLN
34.00 [IU] | Freq: Two times a day (BID) | SUBCUTANEOUS | 3 refills | Status: DC
Start: 2019-08-23 — End: 2019-09-09

## 2019-08-23 NOTE — Telephone Encounter (Signed)
Sent new scripts for pt insulin per request.

## 2019-09-09 ENCOUNTER — Ambulatory Visit: Payer: 59 | Attending: Registered Nurse | Admitting: Registered Nurse

## 2019-09-09 ENCOUNTER — Encounter (INDEPENDENT_AMBULATORY_CARE_PROVIDER_SITE_OTHER): Payer: Self-pay | Admitting: Registered Nurse

## 2019-09-09 VITALS — BP 126/85 | HR 80 | Temp 97.0°F | Resp 14 | Ht 74.02 in | Wt 331.2 lb

## 2019-09-09 DIAGNOSIS — E1165 Type 2 diabetes mellitus with hyperglycemia: Secondary | ICD-10-CM

## 2019-09-09 DIAGNOSIS — E782 Mixed hyperlipidemia: Secondary | ICD-10-CM

## 2019-09-09 DIAGNOSIS — Z794 Long term (current) use of insulin: Secondary | ICD-10-CM

## 2019-09-09 DIAGNOSIS — G629 Polyneuropathy, unspecified: Secondary | ICD-10-CM

## 2019-09-09 MED ORDER — INSULIN GLARGINE 100 UNIT/ML SC SOLN
36.00 [IU] | Freq: Two times a day (BID) | SUBCUTANEOUS | 3 refills | Status: DC
Start: 2019-09-09 — End: 2020-04-12

## 2019-09-09 NOTE — Patient Instructions (Signed)
Increase Lantus to 36 units twice a day    Continue the Humalog before each per your same scale    Continue Metformin 1000 mg in the morning and 500 mg in the evening    Continue Ozempic 0.5 mg weekly      If having hypoglycemia, remember to follow the Rule of 15.  Eat or drink 15 grams of fasting-acting carbohydrates (such as 4 glucose tablets or 4 ounces of juice or regular soda). Blood sugar should be rechecked in 15 minutes and if still low, repeat the treatment.

## 2019-09-09 NOTE — Progress Notes (Signed)
Endocrinology Follow Up Note    Patient Name:  Samuel Koch [[id number]] DOB: Feb 03, 1971  Date: 09/09/2019    Subjective:      Samuel Koch is a 49 y.o. male here for follow up of mildly  uncontrolled type 2 diabetes mellitus.  Diabetes was diagnosed 2017.  Hgb A1c was 7.7% on 05/21/19, previously 10.8% on 11/08/18, 8.5% on 04/14/18, 8.3% on 04/02/18, 8.2% on 03/26/18.   Hgb A1c goal based on age and comorbidities is <7%.  Complications from diabetes include neuropathy and erectile dysfunction.  He has hyperlipidemia and CHF and followed by the heart failure clinic.    Current Diabetes Medications Include:  -Lantus 34 units BID  -Humalog 12 units TID plus 2:50>150  -Metformin 1000 mg AM and 500 mg PM  - not able to tolerate 2000 mg/day  -Ozempic 0.5 mg weekly injections - can tolerate 1 mg    Patient is tolerating medications well.   He ran out of metformin a few weeks ago.          Did not bring blood sugars or meter  He reported his blood sugar readings  Fasting this morning 140-160   During the day 140-180    Reports predinner sugars to be 180-250  Highest to be 260.      Diet, Exercise, and Weight:     The patient tries to watch portions, starches and sweets.  He doesn't eat potatoes.  Has a lot of protein and vegetables.  He drinks coffee and water.  He does not drink surgery drinks.    He has been busy with his job and not exercising regularly.   He is very concerned about his weight.  At one time he was 350 lbs, got down to 295 lbs and is now up to 331lbs which is down 3 lbs since 06/09/19.   He did consider weight loss surgery but he knows his insurance does not cover it as he has already checked.      Diabetic Education: He did met with Fontaine No, [facility] DM program CDE on 12/28/18.         Diabetic Complication Screening/Treatment  Eye exam:    Denies vision problems.  States just had a recent eye exam visit (unsure of name) and does not have diabetic retinopathy.     Urine microalbumin: negative;  Renal labs  on 05/24/19 demonstrate Creat 1.50, BUN 36, eGFR 53.  Microalbumin/creat ratio was 7 on 05/21/19.      Foot Care: Positive for neuropathy and states he has numbness in he toes.  Does not take any medications.   He care for his feet with the help of his wife. Denies wounds or ulcers  Monofilament: partially intact Completed on 11/05/18.    Aspirin Therapy: Does not take ASA    Cardiovascular risk factors: diabetes mellitus, dyslipidemia, family history of premature cardiovascular disease, hypertension, male gender and obesity (BMI >= 30 kg/m2);  Negative for smoking.  Negative for alcohol use.   He has CHF, cardiomyopathy and is followed heart failure clinic.  He had a right heart cath 06/08/19.  He has a pacemaker that was implanted for SSS and is followed by Dr. [name].      Statin therapy: The patient is taking statin therapy as indicated in the medication list below.  Lipid panel on 05/21/19 demonstrates Chol 229, Trig 337 (up from 119), HDL 47, LDL 107 (up from 89).  Currently taking atorvastatin 40 mg daily and this is  managed by PCP.     Ace Inhibitor/ARB: The patient is taking ACE-I/ARB at dose indicated in medication list and is tolerating this well. Currently taking lisinopril 10 mg daily    Current Outpatient Medications   Medication Sig Dispense Refill    atorvastatin (LIPITOR) 20 MG tablet TAKE 1 TABLET BY MOUTH ONCE DAILY AT NIGHT 90 tablet 3    Blood Glucose Monitor, generic, Kit Use as directed to monitor blood sugar 1 kit 1    Blood Glucose Monitoring Suppl (OneTouch Ultra 2) w/Device Kit USE AS DIRECTED TO MONITOR BLOOD SUGAR      bumetanide (BUMEX) 2 MG tablet Take 2 tablets (4 mg total) by mouth every morning AND 2 tablets (4 mg total) Daily after lunch. 270 tablet 3    dilTIAZem (Cardizem CD) 120 MG 24 hr capsule Take 1 capsule (120 mg total) by mouth daily 30 capsule 1    glucose blood test strip Check BS 5 times daily 150 each 11    insulin glargine (LANTUS) 100 UNIT/ML injection Inject 36  Units into the skin every 12 (twelve) hours 75 mL 3    insulin lispro, 1 Unit Dial, (HumaLOG KwikPen) 100 UNIT/ML Solution Pen-injector injection pen 15 units TID pre meals plus sliding scale 2:50>150 with max of 75 units per day 60 mL 3    Insulin Pen Needle (PEN NEEDLES 3/16") 31G X 5 MM Misc 1 each by Does not apply route 4 (four) times daily 901 each 1    Lancets Ultra Fine Misc Check BS 5 times daily 150 each 11    lisinopril (ZESTRIL) 5 MG tablet Take 1 tablet (5 mg total) by mouth daily 30 tablet 11    metFORMIN (GLUCOPHAGE) 500 MG tablet 2 tablets (1000 mg) with breakfast and 1 tablets (500 mg) with dinner 270 tablet 3    potassium chloride (KLOR-CON) 10 MEQ tablet Take 10 mEq by mouth daily         Semaglutide,0.25 or 0.5MG /DOS, (Ozempic, 0.25 or 0.5 MG/DOSE,) 2 MG/1.5ML Solution Pen-injector Inject 0.5 mg into the skin once a week 3 mL 3    spironolactone (ALDACTONE) 25 MG tablet Take 25 mg by mouth daily      vitamin D, ergocalciferol, (DRISDOL) 50000 UNIT Cap 50,000 Units Weekly        Continuous Blood Gluc Sensor (FreeStyle Libre 2 Sensor Systm) Misc 1 Device by Does not apply route every 14 (fourteen) days 2 each 12     No current facility-administered medications for this visit.      Medication review with Patricia Nettle Iseman suggested compliance most of the time.     Immunization History   Administered Date(s) Administered    COVID-19 Ad26 Vaccine Preservative Free 0.5 mL (JANSSEN) 06/23/2019     The following portions of the patient's history were reviewed and updated as appropriate: allergies, current medications, past family history, past medical history, past social history, past surgical history and problem list.  The following information was also reviewed at today's visit: office notes from referring provider, hospital records and lab data    Review of Systems  Review of Systems   Constitutional: Positive for malaise/fatigue. Negative for weight loss.   HENT: Negative for congestion  and sore throat.    Eyes: Negative for blurred vision.   Respiratory: Negative for cough and shortness of breath.    Cardiovascular: Negative for chest pain, palpitations and leg swelling.   Gastrointestinal: Negative for abdominal pain, constipation, diarrhea, nausea and vomiting.  Genitourinary: Negative for dysuria, frequency and hematuria.   Musculoskeletal: Negative for back pain, joint pain and myalgias.   Skin: Negative for rash.   Neurological: Positive for tingling (feet). Negative for tremors, weakness and headaches.   Endo/Heme/Allergies: Negative for polydipsia.   Psychiatric/Behavioral: Negative for depression. The patient is not nervous/anxious and does not have insomnia.         Objective:      Vital Signs: BP 126/85    Pulse 80    Temp 97 F (36.1 C) (Temporal)    Resp 14    Ht 1.88 m (6' 2.02")    Wt 150.2 kg (331 lb 3.2 oz)    BMI 42.51 kg/m   Physical Exam  Vitals and nursing note reviewed.   Constitutional:       General: He is not in acute distress.  HENT:      Head: Normocephalic.   Eyes:      Conjunctiva/sclera: Conjunctivae normal.      Pupils: Pupils are equal, round, and reactive to light.   Neck:      Thyroid: No thyromegaly.   Cardiovascular:      Rate and Rhythm: Normal rate and regular rhythm.      Pulses:           Radial pulses are 2+ on the right side and 2+ on the left side.      Heart sounds: Normal heart sounds, S1 normal and S2 normal.   Pulmonary:      Effort: Pulmonary effort is normal.      Breath sounds: Normal breath sounds.   Musculoskeletal:         General: Normal range of motion.      Cervical back: Normal range of motion and neck supple.   Lymphadenopathy:      Cervical: No cervical adenopathy.   Skin:     General: Skin is warm and dry.   Neurological:      Mental Status: He is alert and oriented to person, place, and time.      Comments:      Psychiatric:         Mood and Affect: Mood and affect normal.         Cognition and Memory: Memory normal.         Judgment:  Judgment normal.          Lab Review    Lab Results   Component Value Date    HGBA1CPERCNT 8.2 03/26/2018    HGBA1CPERCNT 10.7 10/06/2017    HGBA1C 10.8 (H) 11/05/2018    TSH 1.300 11/05/2018    CHOL 201 (H) 03/26/2018    HDL 47 03/26/2018    LDL 108 03/26/2018    TRIG 228 (H) 03/26/2018    ALT 24 12/26/2018    GLU 166 (H) 01/08/2019    K 4.2 01/08/2019    CA 8.7 01/08/2019    CREAT 1.50 (H) 05/24/2019    CO2 25 01/08/2019    EGFR 53 (L) 05/24/2019    NA 137 01/08/2019    ALKPHOS 67 12/26/2018          Impression and Recommendations:    1. Uncontrolled type 2 diabetes mellitus with hyperglycemia  - insulin glargine (LANTUS) 100 UNIT/ML injection; Inject 36 Units into the skin every 12 (twelve) hours  Dispense: 75 mL; Refill: 3    2. Long term current use of insulin    3. Neuropathy    4. Mixed hyperlipidemia    -  Increase Lantus to 36 units BID  -Continue Humalog to 12 units TID before meals plus add correction of 2:50>150  -Continue Metformin to 1000 qAM and 500 mg qPM  -Continue Ozempic 0.5 mg weekly injections  -Asked to check blood sugars 4 times a day and record and send log sheet and send to the office in 2 weeks.  Will evaluate and adjust insulin  -Reviewed signs and symptoms of hypoglycemia and how to treat; Rule of 15  -Reviewed the importance of proper foot care and need to inspect feet daily  -Does have neuropathy; not on gabapentin  -Mild renal dysfunction; No microalbuminuria ; On ACEI which is renal protective   -Does have hyperlipidemia and on a statin (atorvastatin 40 mg)  and managed by PCP  -Does have HTN and managed by PCP.  BP 126/85  -Reviewed diabetic diet, importance of exercise and water intake  -Advised to walk 30 mins 5 days a week and after meals.   -Gets labs at PCP, [place].     Follow up in 4 month or sooner if needed     Counseling:    40 minutes spent with patient face to face, greater than 50% of the office visit was dedicated to counseling and coordination of care, reviewing tests &  labs, treatment options and current plan, and follow up plans.    - Discussed with patient importance of always bringing glucometer and log book to all visits  - Discussed about heart healthy diabetic diet and how this impacts diabetes management  - Discussed about importance of exercise and walking 30 mins 5 days a week (150 mins/week)  - Discussed importance of medication and diet compliance  - Keep up to date with annual eye exams and annual podiatry visits  - Advised to call the office if any issues with glucose management such as any hypoglycemic episodes with glucose <70 and/or persistently elevated glucose levels >200 over the next few days  - Discussed signs and symptoms of hypoglycemia and hyperglycemia and patient is aware on how to treat them.     Jimmy Picket, FNP  Electronically Signed 09/09/2019

## 2019-10-02 ENCOUNTER — Other Ambulatory Visit
Admission: RE | Admit: 2019-10-02 | Discharge: 2019-10-02 | Disposition: A | Discharge status: Home / Self Care | Payer: 59 | Source: Ambulatory Visit | Attending: Family Medicine | Admitting: Family Medicine

## 2019-10-03 LAB — VH APTIMA SARS-COV-2 ASSAY (PANTHER SYSTEM)(TM)
Aptima SARS-CoV-2: NEGATIVE
Does patient have symptoms related to condition of interest?: NEGATIVE
Does patient reside in a congregate care setting?: NEGATIVE
Is patient admitted to the intensive care unit?: NEGATIVE
Is patient employed in a healthcare setting?: NEGATIVE
Is the patient hospitalized because of this condition?: NEGATIVE
Is the patient pregnant?: NEGATIVE

## 2019-10-06 ENCOUNTER — Telehealth: Payer: Self-pay

## 2019-10-06 NOTE — Telephone Encounter (Signed)
Requested ov, labs from pcp

## 2019-10-08 NOTE — Telephone Encounter (Signed)
2nd request

## 2019-10-13 ENCOUNTER — Encounter: Payer: 59 | Admitting: Internal Medicine

## 2019-10-25 ENCOUNTER — Other Ambulatory Visit: Payer: Self-pay | Admitting: Internal Medicine

## 2019-11-11 ENCOUNTER — Telehealth: Payer: Self-pay

## 2019-11-11 NOTE — Telephone Encounter (Signed)
Attempted to call pt regarding holter monitor on 08/06/19. A woman answered the phone stating pt was unavailable & she would have him call us back.    Please see if pt wore holter monitor & if so, did he return it?    Thanks,  [employer], CNA

## 2019-11-17 ENCOUNTER — Telehealth: Payer: Self-pay

## 2019-11-17 NOTE — Telephone Encounter (Signed)
Pts wife called in to have patient scheduled as an urgent add-on before he leaves town.  Pt is scheduled to travel Friday.  Pt was added on to schedule tomorrow at 4:15    Warren Danes RN, BSN  Heart Failure Navigator  Phone:  [phone]  Fax:  [phone]

## 2019-11-18 ENCOUNTER — Ambulatory Visit
Admission: RE | Admit: 2019-11-18 | Discharge: 2019-11-18 | Disposition: A | Discharge status: Home / Self Care | Payer: 59 | Source: Ambulatory Visit | Attending: Family | Admitting: Family

## 2019-11-18 ENCOUNTER — Encounter: Payer: Self-pay | Admitting: Family

## 2019-11-18 VITALS — BP 160/100 | HR 106 | Wt 338.0 lb

## 2019-11-18 DIAGNOSIS — Z950 Presence of cardiac pacemaker: Secondary | ICD-10-CM | POA: Insufficient documentation

## 2019-11-18 DIAGNOSIS — I10 Essential (primary) hypertension: Secondary | ICD-10-CM

## 2019-11-18 DIAGNOSIS — I428 Other cardiomyopathies: Secondary | ICD-10-CM | POA: Insufficient documentation

## 2019-11-18 DIAGNOSIS — I495 Sick sinus syndrome: Secondary | ICD-10-CM | POA: Insufficient documentation

## 2019-11-18 DIAGNOSIS — Z794 Long term (current) use of insulin: Secondary | ICD-10-CM | POA: Insufficient documentation

## 2019-11-18 DIAGNOSIS — R0609 Other forms of dyspnea: Secondary | ICD-10-CM

## 2019-11-18 DIAGNOSIS — I5042 Chronic combined systolic (congestive) and diastolic (congestive) heart failure: Secondary | ICD-10-CM | POA: Insufficient documentation

## 2019-11-18 DIAGNOSIS — Z6841 Body Mass Index (BMI) 40.0 and over, adult: Secondary | ICD-10-CM | POA: Insufficient documentation

## 2019-11-18 DIAGNOSIS — I493 Ventricular premature depolarization: Secondary | ICD-10-CM

## 2019-11-18 DIAGNOSIS — I2510 Atherosclerotic heart disease of native coronary artery without angina pectoris: Secondary | ICD-10-CM | POA: Insufficient documentation

## 2019-11-18 DIAGNOSIS — Z833 Family history of diabetes mellitus: Secondary | ICD-10-CM | POA: Insufficient documentation

## 2019-11-18 DIAGNOSIS — E119 Type 2 diabetes mellitus without complications: Secondary | ICD-10-CM | POA: Insufficient documentation

## 2019-11-18 DIAGNOSIS — R0600 Dyspnea, unspecified: Secondary | ICD-10-CM

## 2019-11-18 DIAGNOSIS — I110 Hypertensive heart disease with heart failure: Secondary | ICD-10-CM | POA: Insufficient documentation

## 2019-11-18 DIAGNOSIS — E781 Pure hyperglyceridemia: Secondary | ICD-10-CM | POA: Insufficient documentation

## 2019-11-18 DIAGNOSIS — N289 Disorder of kidney and ureter, unspecified: Secondary | ICD-10-CM | POA: Insufficient documentation

## 2019-11-18 DIAGNOSIS — Z9049 Acquired absence of other specified parts of digestive tract: Secondary | ICD-10-CM | POA: Insufficient documentation

## 2019-11-18 DIAGNOSIS — Z8249 Family history of ischemic heart disease and other diseases of the circulatory system: Secondary | ICD-10-CM | POA: Insufficient documentation

## 2019-11-18 DIAGNOSIS — Z87891 Personal history of nicotine dependence: Secondary | ICD-10-CM | POA: Insufficient documentation

## 2019-11-18 DIAGNOSIS — Z79899 Other long term (current) drug therapy: Secondary | ICD-10-CM | POA: Insufficient documentation

## 2019-11-18 LAB — I-STAT CHEM 8 CARTRIDGE
Anion Gap I-Stat: 18 — ABNORMAL HIGH (ref 7.0–16.0)
BUN I-Stat: 26 mg/dL — ABNORMAL HIGH (ref 7–22)
Calcium Ionized I-Stat: 5 mg/dL (ref 4.35–5.10)
Chloride I-Stat: 99 mMol/L (ref 98–110)
Creatinine I-Stat: 1.5 mg/dL — ABNORMAL HIGH (ref 0.80–1.30)
EGFR: 53 mL/min/{1.73_m2} — ABNORMAL LOW (ref 60–150)
Glucose I-Stat: 153 mg/dL — ABNORMAL HIGH (ref 71–99)
Hematocrit I-Stat: 44 % (ref 39.0–52.5)
Hemoglobin I-Stat: 15 gm/dL (ref 13.0–17.5)
Potassium I-Stat: 3.7 mMol/L (ref 3.5–5.3)
Sodium I-Stat: 139 mMol/L (ref 136–147)
TCO2 I-Stat: 27 mMol/L (ref 24–29)

## 2019-11-18 MED ORDER — VH POTASSIUM CHLORIDE CRYS ER 20 MEQ PO TBCR (WRAP)
40.0000 meq | EXTENDED_RELEASE_TABLET | Freq: Once | ORAL | Status: AC
Start: 2019-11-18 — End: 2019-11-18
  Administered 2019-11-18: 17:00:00 40 meq via ORAL
  Filled 2019-11-18: qty 2

## 2019-11-18 MED ORDER — BUMETANIDE 0.25 MG/ML IJ SOLN
2.50 mg | Freq: Once | INTRAMUSCULAR | Status: AC
Start: 2019-11-18 — End: 2019-11-18
  Administered 2019-11-18: 2.5 mg via INTRAVENOUS
  Filled 2019-11-18: qty 10

## 2019-11-18 NOTE — Patient Instructions (Signed)
Changes for Today:  1. You were given IV bumex in the clinic today  2. Continue bumex 2 mg in AM and 4 mg in PM  3. Please call [facility] to reschedule your visit with Dr. [name] regarding PVCs.  4. You will need to do your remote check as well.  5. We will see you in 1 week.      Emergency Symptoms of Heart Failure  Call 911 for emergency help, if you have:    Samuel Koch Chest discomfort or pain that lasts more than 15 minutes that is not relieved with rest or nitroglycerin.  . Severe, persistent shortness of breath.  . Fainted or passed out.    Urgent Symptoms of Heart Failure  Call your doctor immediately, if you have any of the following symptoms:    . Increasing shortness of breath or a new shortness of breath while resting.  . Trouble sleeping due to difficulty breathing. For example, waking up suddenly at night due to difficulty breathing.  . A need to sleep sitting up or on more pillows than usual.  . Fast or irregular heart beats, palpitations, or a "racing heart" that persists and makes you feel dizzy or lightheaded.  . Cough up frothy or pink sputum.  . Feel like you may faint or pass out.    Living with Heart Failure Information:    . Follow a sodium restricted diet. Eat no more than 2000 milligrams of sodium per day.  Read labels of packaged foods if you are not familiar with the sodium content per serving.   . Follow a 64 ounce fluid restriction. This is approximately equal to 2 liters, or 2000 cc's of fluid. Count all of the sources of fluid in your diet: drinks, fruits, vegetables, soups, etc.  . Weigh yourself daily.  . Report to our nurses a change in your weight of 2 pounds or more in one day, or 4 pounds or more in one week.  Call if you experience a significant change in your heart failure symptoms.    Activity:  We encourage you to engage in regular physical activity, such as daily walking. Start with short distances and walk at a comfortable pace. You should be able to "walk and talk".  Gradually increase the distance and time that you walk. Exercise will help you feel better and can relieve the symptom of "heavy, tired legs". Stop exercise if you have chest pain, dizziness, or get so short of breath that you can not talk.   You should not lift/push/or pull heavy objects (anything heavy enough to make you strain).     Medications:  Take all your medications as prescribed by your health care provider and bring all your medication bottles to every appointment. It is important that you know what you are taking, when, and for what reason.    Medications you should not take:  Nonsteroidal anti-inflammatory drugs (NSAIDS): such as ibuprofen, Advil, Motrin, naprosyn, Naproxen, may cause fluid retention. You should not take these medications.    Please call with any questions, concerns, or changes in symptoms.    Phone numbers:   - Clinic Office: [phone]

## 2019-11-19 ENCOUNTER — Telehealth: Payer: Self-pay

## 2019-11-19 ENCOUNTER — Encounter: Payer: Self-pay | Admitting: Family

## 2019-11-19 NOTE — Telephone Encounter (Signed)
Patient's wife called, stating pt has had c/o SOB, dizziness, increased PVC's, fatigue. She is requesting an appt ASAP. Next available appt provided, C.Campos 10/29 1020. Advised if symptoms worsen or persist, to call CHF clinic and/or report to the ED.     Jackie Plum, RN

## 2019-11-19 NOTE — Progress Notes (Signed)
[facility]      follow-up Visit    Patient Name: Samuel Koch   Date of Birth: March 20, 1970    Provider: Jones Broom, NP     Referring provider: Faythe Ghee MD  Primary Cardiologist: Lovey Newcomer. [name], DO    Chief Complaint:  Shortness of breath  Subjective   HPI:  I had the pleasure of seeing [name] today at [facility] for an advanced heart failure visit.  Samuel Koch is a 49 y.o. male with non-ischemic cardiomyopathy,  EF 50-55% previous 30% (2017), NYHA Class 3, ACC/AHA Stage C.  Samuel Koch also  has a past medical history of Cardiomyopathy, Congestive heart failure, Diabetes mellitus, Hypertension, and PVC's (premature ventricular contractions).      From a cardiovascular standpoint, Samuel Koch states has not done well.  He has missed a few appointments in the past.  He called the clinic the other day complaining of increased weight and increased palpitations.  He is scheduled to leave to go to [place] for the weekend and wanted to be checked out today to see if he needed IV diuretics prior to him leaving.    He normally follows with Dr. [name] for his PVCs.  He is intolerant to beta-blockers therefore had been started on diltiazem to help control his palpitations.    He is prescribed Bumex 4 mg twice a day however has only been taking 1 tablet in the morning and 2 tablets in the afternoon.  His weight has gone up at home.  He is more short of breath when he walks around.    He has had no hospital admissions or emergency room visits.    Samuel Koch reports that he has not been exercising.  He does check blood pressures at home.  He has been checking morning weights; his weight has not been stable.  Samuel Koch has been following a fluid restriction of 2000 mL per day and has restricting dietary sodium content to no more than 2 gm/day.  He reports that he has been compliant with his medications.      Review of Systems:  ROS    Comprehensive 12 point review of system is negative unless described above in HPI    Review of Cardiovascular Testing:  EKG (date):      1. Chronic combined systolic and diastolic CHF     2. Nonischemic cardiomyopathy   A. LHC (6/17): 30% mid RCA, no other significant disease.   B. Echo (6/17): EF 30-35%.   C. Holter (7/17): 19% PVCs.   D. Echo (3/18): EF 50-55%, moderate LVH, grade II diastolic dysfunction.   E. Echo (9/18): EF 50-55%   F. Echo 10/07/17- LV cavity size is increased. The LVEF is est. at 50%. Mild hypokinesis of the distal septum and apex which may be due to paced rhythm. Pacer wire present in RV.   G. Echo 06/12/2018- Mild concentric LVH, EF 50-55%, aortic root is mildy dilated, aortic root is 3.9cm, tricuspid regurgitation is mild, RSVP .   H. RHC 06/08/2019 co 5.9/ ci 2.2; wedge 16; mean pa 21    3. Type II diabetes, poorly controlled     4. Hypertriglyceridemia     5. PVCs   A. 48 hour holter (7/17) with 19% PVCs.   B. PVC ablation in 8/17 but PVCs recurred.    C. Holter (11/17) with rare PVCs, < 1%.   D. Holter (9/18) with rare PVCs  only     6. Bradycardia/ SSS   A. Dual chamber pacemaker placed ([employer]onic, MRI compatible).     7. NSVT     8. CAD evaluation   A. Echo 03/27/2018- EF 50%, no regional wall motion abnormalities present, grade 2 DD. no significant valvular heart disease.   B. Stress 03/28/2018- Completed vasodilator protocol., No ischemic changes with infusion, No arrhythmias,low risk. Perfusion defect noted thought to be diaphragmatic attenuation Dilated LV with mildly decreased left ventricular function, stress EF 44%.   C. THC 03/30/2018- Non obstructive cad. CO 5 CI 2.0 wedge 14 mean PA 22       Past Surgical History:   has a past surgical history that includes Appendectomy; Cardiac pacemaker placement; Right & Left Heart Cath  w/ Coronary Angios, LV/LA (Right, 03/30/2018); and Right Heart Cath (Right, 06/08/2019).    Family History:  family history includes Diabetes in his  brother and paternal grandmother; Heart attack in his sister; Heart disease in his brother, father, and sister; Hypertension in his father.    Social History:   reports that he has quit smoking. He has quit using smokeless tobacco. He reports that he does not drink alcohol and does not use drugs.    Allergies:  has No Known Allergies.    Medications:    Current Outpatient Medications:   .  Blood Glucose Monitor, generic, Kit, Use as directed to monitor blood sugar, Disp: 1 kit, Rfl: 1  .  Blood Glucose Monitoring Suppl (OneTouch Ultra 2) w/Device Kit, USE AS DIRECTED TO MONITOR BLOOD SUGAR, Disp: , Rfl:   .  bumetanide (BUMEX) 2 MG tablet, Take 2 tablets (4 mg total) by mouth every morning AND 2 tablets (4 mg total) Daily after lunch. (Patient taking differently: Take 1 tablets (2 mg total) by mouth every morning AND 2 tablets (4 mg total) Daily after lunch.), Disp: 270 tablet, Rfl: 3  .  Continuous Blood Gluc Sensor (FreeStyle Libre 2 Sensor Systm) Misc, 1 Device by Does not apply route every 14 (fourteen) days, Disp: 2 each, Rfl: 12  .  dilTIAZem (CARDIZEM CD) 120 MG 24 hr capsule, Take 1 capsule by mouth once daily, Disp: 30 capsule, Rfl: 0  .  glucose blood test strip, Check BS 5 times daily, Disp: 150 each, Rfl: 11  .  insulin glargine (LANTUS SOLOSTAR) 100 UNIT/ML injection pen, , Disp: , Rfl:   .  insulin glargine (LANTUS) 100 UNIT/ML injection, Inject 36 Units into the skin every 12 (twelve) hours, Disp: 75 mL, Rfl: 3  .  insulin lispro, 1 Unit Dial, (HumaLOG KwikPen) 100 UNIT/ML Solution Pen-injector injection pen, 15 units TID pre meals plus sliding scale 2:50>150 with max of 75 units per day, Disp: 60 mL, Rfl: 3  .  Insulin Pen Needle (PEN NEEDLES 3/16") 31G X 5 MM Misc, 1 each by Does not apply route 4 (four) times daily, Disp: 901 each, Rfl: 1  .  Lancets Ultra Fine Misc, Check BS 5 times daily, Disp: 150 each, Rfl: 11  .  lisinopril (ZESTRIL) 5 MG tablet, Take 1 tablet (5 mg total) by mouth daily,  Disp: 30 tablet, Rfl: 11  .  metFORMIN (GLUCOPHAGE) 500 MG tablet, 2 tablets (1000 mg) with breakfast and 1 tablets (500 mg) with dinner, Disp: 270 tablet, Rfl: 3  .  potassium chloride (K-DUR) 10 MEQ tablet, Take 10 mEq by mouth daily Takes extra potassium if he has cramps in his hands , Disp: , Rfl:   .  Semaglutide,0.25 or 0.5MG /DOS, (Ozempic, 0.25 or 0.5 MG/DOSE,) 2 MG/1.5ML Solution Pen-injector, Inject 0.5 mg into the skin once a week, Disp: 3 mL, Rfl: 3  .  spironolactone (ALDACTONE) 25 MG tablet, Take 25 mg by mouth daily, Disp: , Rfl:   .  vitamin D, ergocalciferol, (DRISDOL) 50000 UNIT Cap, 50,000 Units Weekly , Disp: , Rfl:   No current facility-administered medications for this encounter.       Objective   Vitals:    Visit Vitals  BP (!) 160/100 (BP Site: Right arm, Patient Position: Sitting)   Pulse (!) 106   Wt 153.3 kg (338 lb)   SpO2 100%   BMI 43.38 kg/m       Wt Readings from Last 7 Encounters:   11/18/19 153.3 kg (338 lb)   09/09/19 150.2 kg (331 lb 3.2 oz)   06/24/19 153.8 kg (339 lb)   06/17/19 154.7 kg (341 lb)   06/09/19 151.5 kg (334 lb)   06/08/19 149.7 kg (330 lb)   05/24/19 153.4 kg (338 lb 3.2 oz)       Physical Exam:   General: well-developed, overweight male who is alert, NAD  HEENT:  Anicteric sclera  Neck:  JVP ~ 7 mmHg, no carotid bruits. Carotid pulses are 2+ bilaterally  Lungs: CTA B/L, no rhonchi, wheezes or crackles with normal rate and effort  Cardiovascular: s1s2, normal rate, regular rhythm, no m/r/g, PMI laterally displaced  Abdominal: soft, NT/ND, +B.S., no HSM.   Extremities: warm to touch, no cyanosis, trace edema, peripheral pulses are 2+ bilaterally.  Neuromuscular exam: grossly non-focal, though not formally tested.      Laboratory Data:  Lab Results   Component Value Date/Time    NA 137 01/08/2019 11:31 PM    K 4.2 01/08/2019 11:31 PM    CL 105 01/08/2019 11:31 PM    CO2 25 01/08/2019 11:31 PM    CA 8.7 01/08/2019 11:31 PM    GLU 166 (H) 01/08/2019 11:31 PM    CREAT  1.50 (H) 11/18/2019 04:39 PM    CREAT 1.05 01/08/2019 11:31 PM    BUN 16 01/08/2019 11:31 PM    PROT 7.1 12/26/2018 06:20 PM    ALB 4.2 12/26/2018 06:20 PM    ALKPHOS 67 12/26/2018 06:20 PM    ALT 24 12/26/2018 06:20 PM    AST 15 12/26/2018 06:20 PM    BILITOTAL 0.3 12/26/2018 06:20 PM    AGRATIO 1.45 12/26/2018 06:20 PM    ANIONGAP 11.2 01/08/2019 11:31 PM    BUNCRRATIO 15.2 01/08/2019 11:31 PM    EGFR 53 (L) 11/18/2019 04:39 PM    OSMO 279 01/08/2019 11:31 PM    GLOB 2.9 12/26/2018 06:20 PM         Additional Non-ischemic w/u panel:  Lab Results   Component Value Date/Time    TSH 1.300 11/05/2018 12:00 AM       No results found for: HIVAGAB    No results found for: HCVAB, HBV, HEPBCOREAB             Lab Results   Component Value Date/Time    WBC 4.4 01/08/2019 11:31 PM    RBC 4.31 01/08/2019 11:31 PM    HGB 13.2 01/08/2019 11:31 PM    HCT 39.5 01/08/2019 11:31 PM    PLT 207 01/08/2019 11:31 PM     Lab Results   Component Value Date/Time    CHOL 201 (H) 03/26/2018 04:24 PM    TRIG 228 (H) 03/26/2018 04:24  PM    HDL 47 03/26/2018 04:24 PM    LDL 108 03/26/2018 04:24 PM     Lab Results   Component Value Date/Time    HGBA1CPERCNT 8.2 03/26/2018 04:24 PM     Lab Results   Component Value Date/Time    BNP <10.0 01/08/2019 11:31 PM              Today's I-stat Labs:  Results     ** No results found for the last 24 hours. **               Assessment and Plan:  1. NICM (nonischemic cardiomyopathy)  2. Chronic combined systolic and diastolic HF (heart failure)  Stage c, nyha III. Likely PVC induced.  He is s/p ablation for this. Ef now recovered 50-55% from 30-35%. rhc in 5/21 shows slightly elevated filling pressures ra 12, pa 30/12 (21), pcwp 16, co/ci 5.9/2.2.  Currently does not appear to be well compensated.  Continues to feel excess pvcs.  He has been on gdmt for heart failure.      In terms of gdmt:  -does not tolerate bb due to severe fatigue  -lisinopril 5 mg daily  -spironolactone 25 mg daily  -bumex 4 mg  bid  -will provide him with bumex 2.5 mg IV today, will also give him potassium 20 mEq PO as well.    3. Essential hypertension  Well controlled.    4. Renal insufficiency  Stable.  Baseline     5. Obesity, morbid, BMI 40.0-49.9  Encouraged to exercise    6. DOE (dyspnea on exertion)      7. PVC (premature ventricular contraction)  8. SSS (sick sinus syndrome)  9. Pacemaker  Last interrogation March 2021 normal function, 50% a paced, <1% V paced.  Have encouraged him to call the office to reschedule his remote checks for his device as he has none scheduled in Epic.    I do not feel that he has a poor short term cardiac prognosis and do not feel that he warrants consideration for advanced heart failure therapies including mechanical circulatory support or heart transplantation.   We will be monitoring closely for changes in heart failure symptoms, electrolyte abnormalities and arrhythmias.    -HF Education: he has been given heart failure education and to include sodium and fluid restrictions as well as weighing daily.  he can call the clinic if she has any questions regarding medications.     -Devices:  he  is not a candidate for cardiac resynchronization therapy.  Has ppm in placed, see above.    -Exercise :  he  is not a candidate for cardiac rehabilitation.    -Counseling:  Provided in discharge instructions.    -Follow up: 1 week for follow up and check in    Orders Placed This Encounter   Procedures   . i-Stat Chem 8 CartrIDge     Standing Status:   Standing     Number of Occurrences:   1         Our office is available to answer any questions you or the patient may have. We appreciate the opportunity to participate in the care of your patients.    Electronically signed by:    Toma Deiters, AGACNP-BC  VAD Program Coordinator  Advanced Heart Failure Program    [phone] (office) M-F 8a-5p; Pager# [phone] (HF Clinic)    [facility]  [address]  [place], [place] [address]  Note: This chart was generated by the [facility] EMR system/speech recognition and may contain inherit omission or errors not intended by the user.  Grammatical errors, random word insertions, deletions, pronoun errors and incomplete sentences are occasionally consequences of this technology due to software limitations.  Not all errors are caught or corrected.  If there are questions or concerns about the content of this note or information contained in the body of this dictation they should be addressed directly with the author for clarification.

## 2019-11-25 ENCOUNTER — Ambulatory Visit: Payer: 59 | Admitting: Family

## 2019-11-26 ENCOUNTER — Other Ambulatory Visit: Payer: Self-pay | Admitting: Family Nurse Practitioner

## 2019-11-29 ENCOUNTER — Telehealth: Payer: Self-pay

## 2019-11-29 NOTE — Telephone Encounter (Signed)
Requested records from pcp. Requested updates in ce. Patient has an upcoming appt on 12/03/19

## 2019-12-01 NOTE — Telephone Encounter (Signed)
2nd request

## 2019-12-03 ENCOUNTER — Ambulatory Visit: Payer: 59 | Admitting: Physician Assistant

## 2019-12-03 ENCOUNTER — Other Ambulatory Visit: Payer: Self-pay

## 2019-12-03 ENCOUNTER — Encounter: Payer: Self-pay | Admitting: Physician Assistant

## 2019-12-03 VITALS — BP 136/87 | HR 86 | Ht 74.0 in | Wt 338.5 lb

## 2019-12-03 DIAGNOSIS — I5042 Chronic combined systolic (congestive) and diastolic (congestive) heart failure: Secondary | ICD-10-CM

## 2019-12-03 DIAGNOSIS — Z45018 Encounter for adjustment and management of other part of cardiac pacemaker: Secondary | ICD-10-CM

## 2019-12-03 DIAGNOSIS — I493 Ventricular premature depolarization: Secondary | ICD-10-CM

## 2019-12-03 DIAGNOSIS — I495 Sick sinus syndrome: Secondary | ICD-10-CM

## 2019-12-03 DIAGNOSIS — I428 Other cardiomyopathies: Secondary | ICD-10-CM

## 2019-12-03 MED ORDER — DILTIAZEM HCL ER COATED BEADS 180 MG PO CP24
180.0000 mg | ORAL_CAPSULE | Freq: Every day | ORAL | 3 refills | Status: DC
Start: 2019-12-03 — End: 2020-04-25

## 2019-12-03 NOTE — Telephone Encounter (Signed)
NOTE AND LABS RECEIVED ALREADY IN 01.06.21 SCAN ONLY

## 2019-12-03 NOTE — Progress Notes (Signed)
Cardiology Follow-Up      Patient Name: Samuel Koch   Date of Birth: Jul 24, 1970    Provider: Magdalen Spatz, PA     Patient Care Team:  Edmon Crape, MD as PCP - General (Family Medicine)  Abel Presto, DO as Consulting Physician (Cardiology)  Elizbeth Squires, NP as Nurse Practitioner (Family Nurse Practitioner)  Joneen Roach, PA as Physician Assistant (Physician Assistant)    Chief Complaint: PVC (Follow up ), Fatigue, and Bradycardia      Impression      1. PVC's (premature ventricular contractions)    2. SSS (sick sinus syndrome)    3. Adjustment and management of cardiac pacemaker    4. NICM (nonischemic cardiomyopathy)    5. Chronic combined systolic and diastolic HF (heart failure)    6. Obesity, morbid, BMI 40.0-49.9      Plan     1. PVCs: s/p PVC ablation in 2017. EF 50-55% by echo (08/2019). He is more symptomatic over the past month with palpitations and fatigue, however, device interrogation actually shows a slight decrease in hourly PVCs compared to prior. He does have runs of SVT. He wore a 48 hour monitor recently, but we do not have these results yet. He is intolerant to beta blockers. Will try increasing diltiazem to 180 mg to see if this improves his sx.     2. Sinus node dysfunction: s/p dual chamber pacemaker (2017). Has not had any remote or office interrogations since march, so we had his device checked today. He is 35% atrial paced. Normal device function.     3. Nonischemic cardiomyopathy: EF 50-55%, previously 30% in 2017. NYHA class 3. ACC/AHA stage C. Followed by HF clinic. On GDMT.     4. Type II diabetes: followed by primary care    5. Morbid obesity    6. Fatigue: He feels exhausted all the time and this has worsened over the past month. Had normal TSH, CBC, and lyme titer earlier this year. He was tested for sleep apnea in the distant past in [place], [place]. This was reportedly negative. May be worth repeating the sleep study if holter monitor does not reveal any  obvious causes.    7. Chest pain: seems atypical. He had a false positive stress test last year and normal coronaries by [facility] in 03/2018, so do not think repeating a stress test would be helpful. Continue risk factor modification. Will continue to follow.     PLAN:  Obtain 48 hr holter monitor report to assess PVC burden.  Increase diltiazem to 180 mg daily.  Will further update Dr. [name] and follow-up once monitor report reviewed.    History of Present Illness   [name] is a 49 y.o. male being seen today for follow-up.     He has a history hypertension, chronic combined systolic and diastolic CHF, NICM, sinus node dysfunction, type II diabetes, PVCs, and NSVT. S/p dual chamber pacemaker implant in 2017. S/p PVC ablation in 2017.     His most recent echo from 08/13/19 showed EF 50-55%, grade I diastolic dysfunction, RV mildly dilated with normal systolic function, and no significant valvular disease. His EF was previously 30% in 2017. He continues to follow with the HF clinic.     He was seen by Dr. [name] in May, at which time he complained of palpitations, shortness of breath, and weakness. A 48-hour holter monitor was ordered at that time. He did not turn this in until  a week or two ago, so we do not have these results. We did check his device today though, and this demonstrated episodes of SVT lasting several minutes, reduced hourly PVCs, A paced 35%, and normal device function.     He reports worsening fatigue over the past month. He also has shortness of breath, DOE, orthopnea, dizziness when sitting, palpitations, and chest pain. He describes chest pain as sharp. It shoots down his left arm and is typically brief. This occurs at rest and takes his breath away. He also describes a different kind of chest heaviness. This can last up to 20 minutes. This also occurs primarily at rest.      He was tested for sleep apnea in the distant past in [place], [place]. We do not have these results, but apparently this  was normal. The wife denies witnessed apneic episodes, but he does have persistent daytime fatigue.     Past Medical History     PMHx 12/03/19 JAS/ m. sites  Preloaded 11/24/19  LOV 06/24/19 DVA  EKG   Labs 11/18/19 in Epic    -48 hour monitor ordered    1. Chronic combined systolic and diastolic CHF   A. RHC 06/08/19- wedge pressure 16, RB pressure 33/7. Pulmonary artery pressure 30/12       2. Nonischemic cardiomyopathy   A. LHC (6/17): 30% mid RCA, no other significant disease.   B. Echo (6/17): EF 30-35%.   C. Holter (7/17): 19% PVCs.   D. Echo (3/18): EF 50-55%, moderate LVH, grade II diastolic dysfunction.   E. Echo (9/18): EF 50-55%.   F. Echo 10/07/17- LV cavity size is increased. The LVEF is est. at 50%. Mild hypokinesis of the distal septum and apex which may be due to paced rhythm. Pacer wire present in RV.   G. Echo 05/22/18- EF 50-55%. Grade II DD. Mitral valve leaflets mildly thickened without stenosis. Trace MR. Trace TR. Estimated pulmonary arterial pressure is 25-30 mmHg.   H. Echo 06/12/2018- Mild concentric LVH, EF 50-55%, aortic root is mildy dilated, aortic root is 3.9cm, tricuspid regurgitation is mild, RSVP .   I. Echo 08/13/19- EF 50-55%. Grade I diastolic dysfunction of the left ventricle (impaired relaxation pattern). The left ventricular ejection fraction is low normal, estimated at 50- 55%. RV mildly dilated in size with normal systolic function.  A pacer wire is present in RV. No significant valvular heart disease or evidence for pulmonary hypertension.      3. Acute Coronary Syndrome   A. Echo 03/27/2018- Technically difficult study with limited endocardial definition and structural resolution in the apical views, Intravenous contrast was administered to enhance endocardial definition, LV cavity size is norma, mild concentric left ventricular hypertrophy, LV systolic function is at the lower limits of normal, EF 50%, no regional wall motion abnormalities present, LV diastolic filling pattern  is probably pseudonormalized, consistent with grade 2 left ventricular diastolic dysfunction and elevated left atrial pressure, Right-sided chambers are not optimally visualized but appear grossly normal in size, Pacemaker lead is visualized within the right heart chamber, no significant valvular heart disease.   B. Stress 03/28/2018- Completed vasodilator protocol., No ischemic changes with infusion, No arrhythmias, Overall, low risk vasodilator stress rest SPECT perfusion study, a large moderate to severe perfusion defect involving the apex, inferior stripe, mid and basal inferoseptal and inferolateral segments with mild reversibility suggestive of possible ischemia but with likely strong component of diaphragmatic attenuation artifact, Also anteroseptal perfusion defect from attenuation artifact, No significant findings of  ischemia Noted, Dilated LV with mildly decreased left ventricular function, stress EF 44%.   C. THC 03/30/2018- Negative heart cath. False positive stress test.     4. Bradycardia/ SSS   A. 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 [facility])     5. PVCs   A. 48 hour holter (7/17) with 19% PVCs.   B. PVC ablation in 8/17 but PVCs recurred.   C. Holter (11/17) with rare PVCs, < 1%.   D. Holter (9/18) with rare PVCs only. 6. NSVT   7. Type II diabetes   8. Hypertriglyceridemia     He has a history chronic combined systolic and diastolic CHF, NICM, bradycardia, SSS, acute coronary syndrome, type II diabetes, PVCs, and NSVT. S/p dual chamber pacemaker implant in 2017. S/p PVC ablation in 2017. His echo on 06/12/18 demonstrated EF 50-55% with a mildly dilated aortic root at 3.9 cm and mild tricuspid regurgitation.    Allergies     has No Known Allergies.    Medications       Current Outpatient Medications:     Blood Glucose Monitor, generic, Kit, Use as directed to monitor blood sugar, Disp: 1 kit, Rfl: 1    Blood Glucose Monitoring Suppl (OneTouch Ultra 2) w/Device Kit,  USE AS DIRECTED TO MONITOR BLOOD SUGAR, Disp: , Rfl:     bumetanide (BUMEX) 2 MG tablet, Take 2 tablets (4 mg total) by mouth every morning AND 2 tablets (4 mg total) Daily after lunch. (Patient taking differently: Take 1 tablets (2 mg total) by mouth every morning AND 2 tablets (4 mg total) Daily after lunch.), Disp: 270 tablet, Rfl: 3    Continuous Blood Gluc Sensor (FreeStyle Libre 2 Sensor Systm) Misc, 1 Device by Does not apply route every 14 (fourteen) days, Disp: 2 each, Rfl: 12    glucose blood test strip, Check BS 5 times daily, Disp: 150 each, Rfl: 11    insulin glargine (LANTUS SOLOSTAR) 100 UNIT/ML injection pen, , Disp: , Rfl:     insulin glargine (LANTUS) 100 UNIT/ML injection, Inject 36 Units into the skin every 12 (twelve) hours, Disp: 75 mL, Rfl: 3    insulin lispro, 1 Unit Dial, (HumaLOG KwikPen) 100 UNIT/ML Solution Pen-injector injection pen, 15 units TID pre meals plus sliding scale 2:50>150 with max of 75 units per day, Disp: 60 mL, Rfl: 3    Insulin Pen Needle (PEN NEEDLES 3/16") 31G X 5 MM Misc, 1 each by Does not apply route 4 (four) times daily, Disp: 901 each, Rfl: 1    Lancets Ultra Fine Misc, Check BS 5 times daily, Disp: 150 each, Rfl: 11    lisinopril (ZESTRIL) 5 MG tablet, Take 1 tablet (5 mg total) by mouth daily, Disp: 30 tablet, Rfl: 11    metFORMIN (GLUCOPHAGE) 500 MG tablet, 2 tablets (1000 mg) with breakfast and 1 tablets (500 mg) with dinner, Disp: 270 tablet, Rfl: 3    potassium chloride (K-DUR) 10 MEQ tablet, Take 1 tablet by mouth once daily, Disp: 30 tablet, Rfl: 4    Semaglutide,0.25 or 0.5MG /DOS, (Ozempic, 0.25 or 0.5 MG/DOSE,) 2 MG/1.5ML Solution Pen-injector, Inject 0.5 mg into the skin once a week, Disp: 3 mL, Rfl: 3    spironolactone (ALDACTONE) 25 MG tablet, Take 25 mg by mouth daily, Disp: , Rfl:     vitamin D, ergocalciferol, (DRISDOL) 50000 UNIT Cap, 50,000 Units Weekly , Disp: , Rfl:     dilTIAZem (Cardizem CD) 180 MG 24 hr capsule, Take  1 capsule  (180 mg total) by mouth daily, Disp: 90 capsule, Rfl: 3    Review of Systems   Constitution: Fatigue. Negative for activity change, appetite change, fever, and unexpected weight change  HENT: Negative for trouble swallowing, and voice change  Eyes: Negative for any visual disturbances  Respiratory: SOB, DOE, Orthopnea, Negative for apnea and/or awakening with sob  Cardiovascular: Chest pain, palpitations, dizziness; negative for syncope    Comprehensive review of systems performed by me. Unless otherwise noted, all systems are negative.      Physical Exam     Visit Vitals  BP 136/87   Pulse 86   Ht 1.88 m (6\' 2" )   Wt 153.5 kg (338 lb 8 oz)   BMI 43.46 kg/m     Vitals:    12/03/19 1041   BP: 136/87   Pulse: 86   Weight: 153.5 kg (338 lb 8 oz)   Height: 1.88 m (6\' 2" )     Wt Readings from Last 3 Encounters:   12/03/19 153.5 kg (338 lb 8 oz)   11/18/19 153.3 kg (338 lb)   09/09/19 150.2 kg (331 lb 3.2 oz)        Constitutional -  Well appearing, and in no distress, morbidly obese  Respiratory - Clear to auscultation bilaterally, normal respiratory effort    Cardiovascular system -    Regular rate and rhythm    Normal S1, S2    No murmurs, rubs, gallops   Jugular venous pulse is normal        Carotid upstroke normal, no carotid bruits auscultated   2+ pulses in the posterior tibial / dorsalis pedis bilaterally    Neurological - Alert, oriented, no focal neurological deficits  Extremities -  No clubbing or cyanosis. No peripheral edema    Labs     Lab Results   Component Value Date/Time    WBC 4.4 01/08/2019 11:31 PM    RBC 4.31 01/08/2019 11:31 PM    HGB 13.2 01/08/2019 11:31 PM    HCT 39.5 01/08/2019 11:31 PM    PLT 207 01/08/2019 11:31 PM    TSH 1.300 11/05/2018 12:00 AM       Lab Results   Component Value Date/Time    NA 137 01/08/2019 11:31 PM    K 4.2 01/08/2019 11:31 PM    CL 105 01/08/2019 11:31 PM    CO2 25 01/08/2019 11:31 PM    GLU 166 (H) 01/08/2019 11:31 PM    BUN 16 01/08/2019 11:31 PM    CREAT 1.50 (H)  11/18/2019 04:39 PM    CREAT 1.05 01/08/2019 11:31 PM    PROT 7.1 12/26/2018 06:20 PM    ALKPHOS 67 12/26/2018 06:20 PM    AST 15 12/26/2018 06:20 PM    ALT 24 12/26/2018 06:20 PM       Lab Results   Component Value Date/Time    CHOL 201 (H) 03/26/2018 04:24 PM    TRIG 228 (H) 03/26/2018 04:24 PM    HDL 47 03/26/2018 04:24 PM    LDL 108 03/26/2018 04:24 PM       Lab Results   Component Value Date/Time    HGBA1CPERCNT 8.2 03/26/2018 04:24 PM       Cardiogenics:     EKG:  Sinus rhythm at 86 bpm, low voltage          This note was scribed by Loni Muse on behalf of Magdalen Spatz, [place]    Electronically signed by:  Clemon Chambers [place], [place]  12/03/2019

## 2020-01-08 ENCOUNTER — Other Ambulatory Visit (INDEPENDENT_AMBULATORY_CARE_PROVIDER_SITE_OTHER): Payer: Self-pay | Admitting: Registered Nurse

## 2020-01-08 ENCOUNTER — Inpatient Hospital Stay: Admission: RE | Admit: 2020-01-08 | Payer: Self-pay | Source: Ambulatory Visit

## 2020-01-08 ENCOUNTER — Other Ambulatory Visit: Payer: Self-pay

## 2020-01-09 LAB — MICROALBUMIN, RANDOM URINE
Creatinine, UR: 366.1 mg/dL
Microalb/Crt. Ratio: 7 mg/g creat (ref 0–29)
Microalbumin, UR: 26.3 ug/mL

## 2020-01-09 LAB — LIPID PANEL
Cholesterol / HDL Ratio: 4.8 ratio — ABNORMAL HIGH (ref 0.0–4.4)
Cholesterol: 191 mg/dL (ref 100–199)
HDL: 40 mg/dL (ref >39)
LDL Chol Calculated (NIH): 103 mg/dL — ABNORMAL HIGH (ref 0–99)
Triglycerides: 279 mg/dL — ABNORMAL HIGH (ref 0–149)
VLDL Calculated: 48 mg/dL — ABNORMAL HIGH (ref 5–40)

## 2020-01-10 LAB — HEMOGLOBIN A1C: Hemoglobin A1C: 7 % — ABNORMAL HIGH (ref 4.8–5.6)

## 2020-01-10 NOTE — Progress Notes (Signed)
A1c greatly improved to 7 from 10.8.    Very good  Will review other labs at your appointment

## 2020-01-12 ENCOUNTER — Encounter (INDEPENDENT_AMBULATORY_CARE_PROVIDER_SITE_OTHER): Payer: Self-pay | Admitting: Registered Nurse

## 2020-01-12 ENCOUNTER — Emergency Department
Admission: EM | Admit: 2020-01-12 | Discharge: 2020-01-12 | Disposition: A | Discharge status: Home / Self Care | Payer: 59 | Attending: Emergency Medicine | Admitting: Emergency Medicine

## 2020-01-12 ENCOUNTER — Ambulatory Visit: Payer: 59 | Attending: Registered Nurse | Admitting: Registered Nurse

## 2020-01-12 VITALS — BP 122/84 | HR 90 | Temp 98.2°F | Resp 14 | Ht 74.02 in | Wt 339.3 lb

## 2020-01-12 DIAGNOSIS — E1165 Type 2 diabetes mellitus with hyperglycemia: Secondary | ICD-10-CM

## 2020-01-12 DIAGNOSIS — G629 Polyneuropathy, unspecified: Secondary | ICD-10-CM

## 2020-01-12 DIAGNOSIS — L03032 Cellulitis of left toe: Secondary | ICD-10-CM | POA: Insufficient documentation

## 2020-01-12 DIAGNOSIS — L600 Ingrowing nail: Secondary | ICD-10-CM | POA: Insufficient documentation

## 2020-01-12 DIAGNOSIS — E782 Mixed hyperlipidemia: Secondary | ICD-10-CM

## 2020-01-12 DIAGNOSIS — R079 Chest pain, unspecified: Secondary | ICD-10-CM | POA: Insufficient documentation

## 2020-01-12 DIAGNOSIS — Z794 Long term (current) use of insulin: Secondary | ICD-10-CM

## 2020-01-12 DIAGNOSIS — B351 Tinea unguium: Secondary | ICD-10-CM | POA: Insufficient documentation

## 2020-01-12 LAB — I-STAT CHEM 8 CARTRIDGE
Anion Gap I-Stat: 17 — ABNORMAL HIGH (ref 7.0–16.0)
BUN I-Stat: 24 mg/dL — ABNORMAL HIGH (ref 7–22)
Calcium Ionized I-Stat: 4.8 mg/dL (ref 4.35–5.10)
Chloride I-Stat: 101 mMol/L (ref 98–110)
Creatinine I-Stat: 1.2 mg/dL (ref 0.80–1.30)
EGFR: 70 mL/min/{1.73_m2} (ref 60–150)
Glucose I-Stat: 62 mg/dL — ABNORMAL LOW (ref 71–99)
Hematocrit I-Stat: 44 % (ref 39.0–52.5)
Hemoglobin I-Stat: 15 gm/dL (ref 13.0–17.5)
Potassium I-Stat: 3.4 mMol/L — ABNORMAL LOW (ref 3.5–5.3)
Sodium I-Stat: 142 mMol/L (ref 136–147)
TCO2 I-Stat: 28 mMol/L (ref 24–29)

## 2020-01-12 LAB — ECG 12-LEAD
P Wave Axis: 41 deg
P-R Interval: 181 ms
Patient Age: 49 years
Q-T Interval(Corrected): 411 ms
Q-T Interval: 356 ms
QRS Axis: 58 deg
QRS Duration: 89 ms
T Axis: 19 years
Ventricular Rate: 80 //min

## 2020-01-12 LAB — VH DEXTROSE STICK GLUCOSE: Glucose POCT: 66 mg/dL — ABNORMAL LOW (ref 71–99)

## 2020-01-12 LAB — VH I-STAT TROPONIN NOTIFICATION

## 2020-01-12 LAB — VH I-STAT CHEM 8 NOTIFICATION

## 2020-01-12 LAB — I-STAT TROPONIN: Troponin I I-Stat: <0.02 ng/mL (ref 0.00–0.08)

## 2020-01-12 MED ORDER — CEPHALEXIN 500 MG PO CAPS
500.0000 mg | ORAL_CAPSULE | Freq: Four times a day (QID) | ORAL | 0 refills | Status: AC
Start: 2020-01-12 — End: 2020-01-19

## 2020-01-12 MED ORDER — HYDROCODONE-ACETAMINOPHEN 5-325 MG PO TABS
1.0000 | ORAL_TABLET | Freq: Four times a day (QID) | ORAL | 0 refills | Status: DC | PRN
Start: 2020-01-12 — End: 2020-01-12

## 2020-01-12 MED ORDER — ROSUVASTATIN CALCIUM 5 MG PO TABS
5.0000 mg | ORAL_TABLET | Freq: Every day | ORAL | 1 refills | Status: DC
Start: 2020-01-12 — End: 2020-07-19

## 2020-01-12 MED ORDER — CEPHALEXIN 500 MG PO CAPS
ORAL_CAPSULE | ORAL | Status: AC
Start: 2020-01-12 — End: ?
  Filled 2020-01-12: qty 1

## 2020-01-12 MED ORDER — HYDROCODONE-ACETAMINOPHEN 10-325 MG PO TABS
0.5000 | ORAL_TABLET | Freq: Four times a day (QID) | ORAL | 0 refills | Status: DC | PRN
Start: 2020-01-12 — End: 2020-02-05

## 2020-01-12 MED ORDER — CEPHALEXIN 500 MG PO CAPS
500.0000 mg | ORAL_CAPSULE | Freq: Once | ORAL | Status: AC
Start: 2020-01-12 — End: 2020-01-12
  Administered 2020-01-12: 16:00:00 500 mg via ORAL

## 2020-01-12 MED ORDER — LIDOCAINE-EPINEPHRINE 2 %-1:100000 IJ SOLN
INTRAMUSCULAR | Status: AC
Start: 2020-01-12 — End: ?
  Filled 2020-01-12: qty 20

## 2020-01-12 MED ORDER — INSULIN SYRINGES (DISPOSABLE) U-100 1 ML MISC
1.0000 | Freq: Two times a day (BID) | 0 refills | Status: AC
Start: 2020-01-12 — End: ?

## 2020-01-12 NOTE — Discharge Instructions (Signed)
Ingrown toenail discharge instructions: Leave dressing in place until seen by podiatry Friday morning.  If not seen Friday morning, please remove dressing both external and internal dressing underneath nail bed and wash with soap and water, apply over-the-counter antibiotic ointment film to the entire nail bed, apply loosely taped gauze twice daily.   Open toed shoes at all times. Return for increased pain, redness, discharge.     Discharge instructions of chest pain: Continue with your present management per cardiology.    Acetaminophen - Take 650 mg every 4 to 6 hours for pain/fever.  Not to exceed 3000mg /24 hours.    DO NOT MIX TYLENOL WITH PRESCRIBED TYLENOL/HYDROCODONE PRESCRIPTION.        Paronychia of theFinger or Toe  Paronychia is an infection near a fingernail or toenail. It usually occurs when an opening in the cuticle or an ingrown toenail lets bacteria under the skin.   The infection will need to be drained if pus is present. If the infection has been caught early, you may need only antibiotic treatment. Healing will take about 1 to 2 weeks.   Home care  Follow these guidelines when caring for yourself at home:    Clean and soak the toe or finger. Do this 2 times a day for the first 3 days. To do so:  ? Soak your foot or hand in a tub of warm water for 5 minutes. Or hold your toe or finger under a faucet of warm running water for 5 minutes.  ? Clean any crust away with soap and water using a cotton swab.  ? Put antibiotic ointment on the infected area.   Change the dressing daily or any time it gets dirty.   If you were given antibiotics, take them as directed until they are all gone.   If your infection is on a toe, wear comfortable shoes with a lot of toe room. You can also wear open-toed sandals while your toe heals.   You may use over-the-counter medicine (acetaminophen or ibuprofen) to help with pain, unless another medicine was prescribed. If you have chronic liver or kidney disease, talk  with your healthcare provider before using these medicines. Also talk with your provider if you've had a stomach ulcer or gastrointestinal bleeding.  Prevention  The following can prevent paronychia:    Don't cut or play with your cuticles at home. A healthy cuticle maintains a seal between your skin and nail and keeps out infection.   Don't bite your nails.   Don't suck on your thumbs or fingers.  Follow-up care  Follow up with your healthcare provider, or as advised.   When to seek medical advice  Call your healthcare provider right away if any of these occur:    Redness, pain, or swelling of the finger or toe gets worse   You have trouble moving or bending the finger or toe   Red streaks in the skin leading away from the wound   Pus or fluid draining from the nail area   Fever of 100.17F (38C) or higher, or as directed by your provider  [employer] last reviewed this educational content on 08/04/2017   2000-2021 [employer], [place]. All rights reserved. This information is not intended as a substitute for professional medical care. Always follow your healthcare professional's instructions.

## 2020-01-12 NOTE — Patient Instructions (Addendum)
Continue Lantus and Humalog , metformin and Ozempic as before      Start rosuvastatin 5 mg for high cholesterol once a day      Recheck labs, A1c and Lipids in 3 months.          If having hypoglycemia (low blood sugar with symptoms of shakiness, sweatiness, confusion, feeling weak), remember to follow the Rule of 15.  Eat or drink 15 grams of fast-acting carbohydrates (such as 4 glucose tablets or 4 ounces of juice or regular soda) and sit down.    Recheck blood sugar in 15 minutes and if still low, repeat eating or drinking 15 grams of fast-acting carbohydrates.    Then plan to eat your regular meal or protein to keep your blood sugar from dropping low.     Always bring a list of blood sugar readings (some before meals and some 2 hours after meals) and your glucose meter to each appointment.   Seeing your blood sugars is how we decide if your medications need to be adjusted.       Be sure to watch your diet and portion sizes of food.   Avoid regular soda and juice as these have a large amount of sugar.   Drinking water helps to lower your blood sugars.

## 2020-01-12 NOTE — ED Triage Notes (Signed)
Patient to room E50/E50-A    Samuel Koch is a 49 y.o. male presenting to the ED for Toe Pain    The patient arrived by private car and is alone.    Nursing (triage) note reviewed for the following pertinent information:  Pt presents with left big toe pain.  States has a really bad toe nail since hx of frost bite.

## 2020-01-12 NOTE — Progress Notes (Signed)
Endocrinology Follow Up Note    Patient Name:  Balen Woolum [[id number]] DOB: 04-Sep-1970  Date: 01/12/2020    Subjective:      Delbert Darley is a 49 y.o. male here for follow up of mildly  uncontrolled type 2 diabetes mellitus.  Diabetes was diagnosed 2017.    Hgb A1c 7.0% pm 01/08/20, previously was 7.7% on 05/21/19, 10.8% on 11/08/18, 8.5% on 04/14/18, 8.3% on 04/02/18, 8.2% on 03/26/18.   Hgb A1c goal based on age and comorbidities is <7%.  Complications from diabetes include neuropathy and erectile dysfunction.  He has hyperlipidemia and CHF and followed by the heart failure clinic.    Current Diabetes Medications Include:  -Lantus 36 units BID  -Humalog 14 units TID plus 2:50>150  -Metformin 1000 mg AM and 500 mg PM  - not able to tolerate 2000 mg/day  -Ozempic 0.5 mg weekly injections - can tolerate 1 mg    Patient is tolerating medications well.   He ran out of metformin a few weeks ago.        Did not bring blood sugars or meter  He reported his blood sugar readings  Fasting 150-180  Does not routinely check at other times of the day    Diet, Exercise, and Weight:     The patient tries to watch portions, starches and sweets.  He doesn't eat potatoes.  Has a lot of protein and vegetables.  He drinks coffee and water.  He does not drink surgery drinks.    He has been busy with his job and not exercising regularly.   At one time he was 350 lbs, got down to 295 lbs and is now up to 339 lbs, up 6 lbs since 09/09/19.  He did consider weight loss surgery but he knows his insurance does not cover it as he has already checked.      Diabetic Education: He did met with Fontaine No, [facility] CDE on 12/28/18.         Diabetic Complication Screening/Treatment  Eye exam:    Denies vision problems.  Eye exam visit (unsure of name) within the last 6 months and does not have diabetic retinopathy.     Urine microalbumin: negative;  Renal labs on 11/18/19 demonstrate Creat 1.50, BUN 26, eGFR 53.  Microalbumin/creat ratio was  7 on 01/08/20.      Foot Care: Positive for neuropathy and states he has numbness in he toes.  Does not take any medications.   He cares for his feet with the help of his wife. Denies wounds or ulcers  Monofilament: partially intact Completed on 11/05/18.    Aspirin Therapy: Does not take ASA    Cardiovascular risk factors: diabetes mellitus, dyslipidemia, family history of premature cardiovascular disease, hypertension, male gender and obesity (BMI >= 30 kg/m2);  Negative for smoking.  Negative for alcohol use.   He has CHF, cardiomyopathy and is followed heart failure clinic.  He had a right heart cath 06/08/19.  He has a pacemaker that was implanted for SSS and is followed by Dr. [name].      Statin therapy: The patient is taking statin therapy as indicated in the medication list below.  Lipid panel on 01/08/20 demonstrates Chol 191, Trig 276 (down from 337), HDL 48, LDL 103 (down from 107).  Had been on atorvastatin 40 mg daily but not able to tolerate the side effects.       Ace Inhibitor/ARB: The patient is taking ACE-I/ARB at  dose indicated in medication list and is tolerating this well. Currently taking lisinopril 10 mg daily    Current Outpatient Medications   Medication Sig Dispense Refill    Blood Glucose Monitor, generic, Kit Use as directed to monitor blood sugar 1 kit 1    Blood Glucose Monitoring Suppl (OneTouch Ultra 2) w/Device Kit USE AS DIRECTED TO MONITOR BLOOD SUGAR      bumetanide (BUMEX) 2 MG tablet Take 2 tablets (4 mg total) by mouth every morning AND 2 tablets (4 mg total) Daily after lunch. (Patient taking differently: Take 1 tablets (2 mg total) by mouth every morning AND 2 tablets (4 mg total) Daily after lunch.) 270 tablet 3    dilTIAZem (Cardizem CD) 180 MG 24 hr capsule Take 1 capsule (180 mg total) by mouth daily 90 capsule 3    glucose blood test strip Check BS 5 times daily 150 each 11    insulin glargine (LANTUS) 100 UNIT/ML injection Inject 36 Units into the skin every 12  (twelve) hours 75 mL 3    insulin lispro, 1 Unit Dial, (HumaLOG KwikPen) 100 UNIT/ML Solution Pen-injector injection pen 15 units TID pre meals plus sliding scale 2:50>150 with max of 75 units per day 60 mL 3    Insulin Pen Needle (PEN NEEDLES 3/16") 31G X 5 MM Misc 1 each by Does not apply route 4 (four) times daily 901 each 1    Lancets Ultra Fine Misc Check BS 5 times daily 150 each 11    lisinopril (ZESTRIL) 5 MG tablet Take 1 tablet (5 mg total) by mouth daily 30 tablet 11    metFORMIN (GLUCOPHAGE) 500 MG tablet 2 tablets (1000 mg) with breakfast and 1 tablets (500 mg) with dinner 270 tablet 3    potassium chloride (K-DUR) 10 MEQ tablet Take 1 tablet by mouth once daily 30 tablet 4    Semaglutide,0.25 or 0.5MG /DOS, (Ozempic, 0.25 or 0.5 MG/DOSE,) 2 MG/1.5ML Solution Pen-injector Inject 0.5 mg into the skin once a week 3 mL 3    spironolactone (ALDACTONE) 25 MG tablet Take 25 mg by mouth daily      vitamin D, ergocalciferol, (DRISDOL) 50000 UNIT Cap 50,000 Units Weekly        Continuous Blood Gluc Sensor (FreeStyle Libre 2 Sensor Systm) Misc 1 Device by Does not apply route every 14 (fourteen) days 2 each 12    Insulin Syringes, Disposable, U-100 1 ML Misc 1 each by Does not apply route 2 (two) times daily 180 each 0    rosuvastatin (CRESTOR) 5 MG tablet Take 1 tablet (5 mg total) by mouth daily 90 tablet 1     No current facility-administered medications for this visit.      Medication review with Patricia Nettle Neuhaus suggested compliance most of the time.     Immunization History   Administered Date(s) Administered    COVID-19 Ad26 Vaccine Preservative Free 0.5 mL (JANSSEN) 06/23/2019     The following portions of the patient's history were reviewed and updated as appropriate: allergies, current medications, past family history, past medical history, past social history, past surgical history and problem list.  The following information was also reviewed at today's visit: office notes from referring  provider, hospital records and lab data    Review of Systems  Review of Systems   Constitutional: Positive for malaise/fatigue. Negative for weight loss.   HENT: Negative for congestion and sore throat.    Eyes: Negative for blurred vision.   Respiratory: Negative  for cough and shortness of breath.    Cardiovascular: Negative for chest pain, palpitations and leg swelling.   Gastrointestinal: Negative for abdominal pain, constipation, diarrhea, nausea and vomiting.   Genitourinary: Negative for dysuria, frequency and hematuria.   Musculoskeletal: Negative for back pain, joint pain and myalgias.   Skin: Negative for rash.   Neurological: Positive for tingling (feet). Negative for tremors, weakness and headaches.   Endo/Heme/Allergies: Negative for polydipsia.   Psychiatric/Behavioral: Negative for depression. The patient is not nervous/anxious and does not have insomnia.         Objective:      Vital Signs: BP 122/84    Pulse 90    Temp 98.2 F (36.8 C) (Temporal)    Resp 14    Ht 1.88 m (6' 2.02")    Wt 153.9 kg (339 lb 4.6 oz)    BMI 43.54 kg/m   Physical Exam  Vitals and nursing note reviewed.   Constitutional:       General: He is not in acute distress.  HENT:      Head: Normocephalic.   Eyes:      Conjunctiva/sclera: Conjunctivae normal.      Pupils: Pupils are equal, round, and reactive to light.   Neck:      Thyroid: No thyromegaly.   Cardiovascular:      Rate and Rhythm: Normal rate and regular rhythm.      Pulses:           Radial pulses are 2+ on the right side and 2+ on the left side.      Heart sounds: Normal heart sounds, S1 normal and S2 normal.   Pulmonary:      Effort: Pulmonary effort is normal.      Breath sounds: Normal breath sounds.   Musculoskeletal:         General: Normal range of motion.      Cervical back: Normal range of motion and neck supple.   Lymphadenopathy:      Cervical: No cervical adenopathy.   Skin:     General: Skin is warm and dry.   Neurological:      Mental Status: He is  alert and oriented to person, place, and time.      Comments:      Psychiatric:         Mood and Affect: Mood and affect normal.         Cognition and Memory: Memory normal.         Judgment: Judgment normal.          Lab Review    Lab Results   Component Value Date    HGBA1CPERCNT 8.2 03/26/2018    HGBA1CPERCNT 10.7 10/06/2017    HGBA1C 7.0 (H) 01/08/2020    HGBA1C 10.8 (H) 11/05/2018    TSH 1.300 11/05/2018    CHOL 191 01/08/2020    HDL 40 01/08/2020    LDL 103 (H) 01/08/2020    TRIG 279 (H) 01/08/2020    ALT 24 12/26/2018    GLU 166 (H) 01/08/2019    K 4.2 01/08/2019    CA 8.7 01/08/2019    CREAT 1.50 (H) 11/18/2019    CO2 25 01/08/2019    EGFR 53 (L) 11/18/2019    NA 137 01/08/2019    ALKPHOS 67 12/26/2018          Impression and Recommendations:    1. Uncontrolled type 2 diabetes mellitus with hyperglycemia  - Hemoglobin A1C; Future  2. Long term current use of insulin    3. Neuropathy    4. Mixed hyperlipidemia  - rosuvastatin (CRESTOR) 5 MG tablet; Take 1 tablet (5 mg total) by mouth daily  Dispense: 90 tablet; Refill: 1  - Lipid panel; Future    -Does not have blood sugar readings today;   A1c much improved  -Continue Lantus to 36 units BID  -Continue Humalog 14 units TID  Plus correction scale 2:50>150  -Continue Metformin to 1000 qAM and 500 mg qPM  -Continue Ozempic 0.5 mg weekly injections  -Reviewed signs and symptoms of hypoglycemia and how to treat; Rule of 15  -Reviewed the importance of proper foot care and need to inspect feet daily  -Does have neuropathy; not on gabapentin  -Mild renal dysfunction; No microalbuminuria ; On ACEI which is renal protective   -Does have hyperlipidemia; not able to tolerate atorvastatin;  Willing to try another agent as LDL is 103 and his goal is less than 70;     Rx for rosuvastatin 5 mg sent;  Advised to take in the evening.   -Does have HTN and managed by PCP.  BP 122/84  -Reviewed diabetic diet, importance of exercise and water intake  -Advised to walk 30 mins 5  days a week and after meals.   -A1c and Lipids in 3 months.     Follow up in 4 month or sooner if needed     Counseling:    40 minutes spent with patient face to face, greater than 50% of the office visit was dedicated to counseling and coordination of care, reviewing tests & labs, treatment options and current plan, and follow up plans.    - Discussed with patient importance of always bringing glucometer and log book to all visits  - Discussed about heart healthy diabetic diet and how this impacts diabetes management  - Discussed about importance of exercise and walking 30 mins 5 days a week (150 mins/week)  - Discussed importance of medication and diet compliance  - Keep up to date with annual eye exams and annual podiatry visits  - Discussed signs and symptoms of hypoglycemia and hyperglycemia and patient is aware on how to treat them.     Jimmy Picket, FNP  Electronically Signed 01/12/2020

## 2020-01-12 NOTE — ED Provider Notes (Signed)
Physician/Midlevel provider first contact with patient: 01/12/20 1506         History     Chief Complaint   Patient presents with    Toe Pain     Samuel Koch  is a 49 y.o. male with hx of nonischemic cardiomyopathy, IDDM [controlled], HTN [controlled] presenting with L great toe pain, redness, swelling,  subjective fever, chills.    L great toe pain and redness, fever: Pt reports chronic hx of recurrent irregular thickened L great toenail secondary to previous frostbite, nail loss and fungal overgrowth. He reports 2 days of L great toe pain with associated redness and edema surrounding his L great toe nailbed and distal phalanx, one day of subjective fever and chills.   Pt reports additional baseline diminished sensation in toes secondary to diabetic neuropathy. Pt reports last tetanus vaccination 7 years previous.     Chest pain, SOB, DOE, diaphoresis, dizziness, fatigue, weakness: Pt reports chronic intermittent episodes of constillation of sx lasting <5 minutes in length and resolving spontaneously. He reports having x1 episode of CP daily at baseline.  He reports his cardiologist has been following along with him regularly regarding his known cardiac disease. [see below cardiac hx]. He reports 1 hour ago having a 5 min episode of chest pain with associated dizziness, fatigue, gait instability~resolving spontaneously. Denies associated  Palpitations.    Nausea: Pt reports 3-4 weeks of persistent nausea. Denies associated abdominal pain, vomiting, diarrhea.    Cardiology hx: Pt is followed by Shanon Ace, PA and Dr. [name] with [facility]. 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 Pt's pacemaker last interrogated 12/03/2019 WNL. Per review of EMR- pt's most recent echocardiogram completed 08/13/2019 demonstrating grade I diastolic dysfunction and a left ventricular EF of 50-55%. Pt with recent RHC 06/11/2019  mild pulm HTN 30/12. 2018/03/30 L and RHC: Minimal RCA disease, o/w clean cath.    Pulmonary embolism risk factors: Obesity, CHF.  Patient denies pulmonary embolism risk factors including: (relative risk of venous thromboembolism)  Inherited: Factor 5 Leiden mutation (x 7), prothrombin gene 20210A mutation(x 2.8), protein S deficiency, protein C deficiency, antithrombin deficiency, congenital heart disease.  Acquired, other disorders: trauma, surgery (especially orthopaedic), immobilization x 3 days or hospitalization in past 30 days, pregnancy, oral contraception (x 4), hormone replacement therapy, calf/ankle edema, prior DVT or pulmonary embolism, malignancy under treatment or diagnosed in past 6 months, Myeloproliferative neoplasms (polycythemia vera, essential thrombocytopenia), hemoptysis, inflammatory bowel disease, nephrotic syndrome, paroxysmal nocturnal hemoglobinuria, recent long distance travel, heavy cigarette smoking, Covid-19 infection, older age (> 78 years old).  Oral contraceptives + factor 5 Leiden mutation (x 35)    IDDM: Pt reports chronic long term use of Insulin, reports most recent blood sugar recorded to be between 160-180. Pt is regularly followed by Cira Servant, NP regarding diabetic management and mixed hyperlipidemia.     Covid-19: Fully vaccinated.     SHx: Denies tobacco, alcohol, street drug use.     Past Medical History:   Diagnosis Date    Cardiomyopathy     Congestive heart failure     Diabetes mellitus     Hypertension     PVC's (premature ventricular contractions)        Past Surgical History:   Procedure Laterality Date    APPENDECTOMY (OPEN)      CARDIAC PACEMAKER PLACEMENT      RIGHT & LEFT HEART CATH  W/ CORONARY ANGIOS, LV/LA  Right 03/30/2018    Procedure: RIGHT & LEFT HEART CATH  W/ CORONARY ANGIOS, LV/LA;  Surgeon: Santo Held, MD;  Location: [facility] CATH[facility]EP;  Service: Cardiovascular;  Laterality: Right;    RIGHT HEART CATH Right 06/08/2019    Procedure: RIGHT HEART  CATH;  Surgeon: Adonis Brook, MD;  Location: [facility] CATH/EP;  Service: Cardiovascular;  Laterality: Right;  ARRIVAL TIME       Family History   Problem Relation Age of Onset    Heart disease Father     Hypertension Father     Heart disease Sister     Heart attack Sister     Heart disease Brother     Diabetes Brother     Diabetes Paternal Grandmother        Social  Social History     Tobacco Use    Smoking status: Former Smoker    Smokeless tobacco: Former Neurosurgeon    Tobacco comment: when 49 years old   Psychologist, educational Use    Vaping Use: Never used   Substance Use Topics    Alcohol use: No    Drug use: No       .     No Known Allergies    Home Medications     Med List Status: In Progress Set By: Margorie John, RN at 01/12/2020  3:07 PM                Blood Glucose Monitor, generic, Kit     Use as directed to monitor blood sugar     Blood Glucose Monitoring Suppl (OneTouch Ultra 2) w/Device Kit     USE AS DIRECTED TO MONITOR BLOOD SUGAR     bumetanide (BUMEX) 2 MG tablet     Take 2 tablets (4 mg total) by mouth every morning AND 2 tablets (4 mg total) Daily after lunch.     Patient taking differently: Take 1 tablets (2 mg total) by mouth every morning AND 2 tablets (4 mg total) Daily after lunch.     Continuous Blood Gluc Sensor (FreeStyle Libre 2 Sensor Systm) Misc     1 Device by Does not apply route every 14 (fourteen) days     dilTIAZem (Cardizem CD) 180 MG 24 hr capsule     Take 1 capsule (180 mg total) by mouth daily     glucose blood test strip     Check BS 5 times daily     insulin glargine (LANTUS) 100 UNIT/ML injection     Inject 36 Units into the skin every 12 (twelve) hours     insulin lispro, 1 Unit Dial, (HumaLOG KwikPen) 100 UNIT/ML Solution Pen-injector injection pen     15 units TID pre meals plus sliding scale 2:50>150 with max of 75 units per day     Insulin Pen Needle (PEN NEEDLES 3/16") 31G X 5 MM Misc     1 each by Does not apply route 4 (four) times daily     Insulin Syringes,  Disposable, U-100 1 ML Misc     1 each by Does not apply route 2 (two) times daily     Lancets Ultra Fine Misc     Check BS 5 times daily     lisinopril (ZESTRIL) 5 MG tablet     Take 1 tablet (5 mg total) by mouth daily     metFORMIN (GLUCOPHAGE) 500 MG tablet     2 tablets (1000 mg) with breakfast and 1 tablets (  500 mg) with dinner     potassium chloride (K-DUR) 10 MEQ tablet     Take 1 tablet by mouth once daily     rosuvastatin (CRESTOR) 5 MG tablet     Take 1 tablet (5 mg total) by mouth daily     Semaglutide,0.25 or 0.5MG /DOS, (Ozempic, 0.25 or 0.5 MG/DOSE,) 2 MG/1.5ML Solution Pen-injector     Inject 0.5 mg into the skin once a week     spironolactone (ALDACTONE) 25 MG tablet     Take 25 mg by mouth daily     vitamin D, ergocalciferol, (DRISDOL) 50000 UNIT Cap     50,000 Units Weekly                    Ongoing Comment    Stottlemyer, Eric, PharmD    03/26/2018  7:40 PM    03/26/2018 - pt admittedly noncompliant with meds as noted           Review of Systems   Constitutional: Positive for diaphoresis, fatigue and fever (Tactile). Negative for activity change.   HENT: Negative for ear pain and sore throat.    Respiratory: Positive for shortness of breath (+DOE). Negative for cough and chest tightness.    Cardiovascular: Positive for chest pain. Negative for leg swelling.   Gastrointestinal: Positive for nausea. Negative for abdominal pain, blood in stool (Melena neg), diarrhea and vomiting.   Genitourinary: Negative for dysuria and flank pain.   Musculoskeletal: Positive for arthralgias (L great toe) and gait problem (Instability associated with episodes of CP).        Denies Injuries   Skin: Positive for color change (L toe erythema) and rash (ecchymosis surrounding L great toe).   Neurological: Positive for dizziness, weakness (generalized) and numbness (Baseline BLE neuropathy).   Psychiatric/Behavioral: Negative for behavioral problems.   All other systems reviewed and are negative.      Physical Exam    BP:  115/67, Heart Rate: 100, Temp: 98.5 F (36.9 C), Resp Rate: 18, SpO2: 99 %, Weight: 154.3 kg     Physical Exam  Constitutional:       Appearance: Normal appearance. He is morbidly obese.   HENT:      Head: Normocephalic.   Cardiovascular:      Rate and Rhythm: Normal rate and regular rhythm.      Pulses: Normal pulses.      Heart sounds: No murmur heard.      Pulmonary:      Effort: Pulmonary effort is normal. No respiratory distress.      Breath sounds: Normal breath sounds.   Abdominal:      General: Abdomen is flat. Bowel sounds are normal.      Tenderness: There is no abdominal tenderness. There is no right CVA tenderness or left CVA tenderness.   Musculoskeletal:         General: Tenderness present. Normal range of motion.      Cervical back: Normal range of motion.      Right lower leg: No edema.      Left lower leg: No edema.      Comments: LLE: Great toe: distal phalanx: Ventral surface: Erythema and area of fluctuance at base, without spontaneous drainage of epionychium consistent with paronychia, moderate tenderness throughout ventral and mild dorsal to palpation.  Severe onychomycosis and thickening of nail with nail loosely attached at edges and epinychium.    Skin:     General: Skin is warm.  Findings: Erythema (L Great toe, distal phalanx without proximal lymphatic streak. ) present.   Neurological:      General: No focal deficit present.      Mental Status: He is alert and oriented to person, place, and time. Mental status is at baseline.      Motor: No weakness.   Psychiatric:         Mood and Affect: Mood normal.         Behavior: Behavior normal.         Thought Content: Thought content normal.         Judgment: Judgment normal.     I have interpreted the EKG at the time it was performed and my official reading is as follows:     Last EKG Result     Procedure Component Value Units Date/Time    ECG 12 lead (Specify reason for exam) [[id number]] Collected: 01/12/20 1545     Updated: 01/12/20 2131      Patient Age 56 years      Patient DOB 20-Dec-1970     Patient Height --     Patient Weight --     Interpretation Text --     NSR  Compared to ECG 06/08/2019 09:51:51  Resoloution of A pacing  Otherwise minimal change  Electronically Signed On 01-12-2020 21:30:44 EST by Burna Cash       Physician Interpreter Burna Cash     Ventricular Rate 80 //min      QRS Duration 89 ms      P-R Interval 181 ms      Q-T Interval 356 ms      Q-T Interval(Corrected) 411 ms      P Wave Axis 41 deg      QRS Axis 58 deg      T Axis 19 years           DDX   DDX painful toe, non-trauma: Includes but is not limited to paronychia, felon, cellulitis, abscess, osteomyelitis.    DDX painful rash includes, but is not limited to traction blister, cellulitis, abscess, osteomyelitis, necrotizing fasciitis, subcutaneous foreign body, venous stasis dermatitis, severe subcutaneous edema with associated weeping vesicles, sepsis, sunburn, UV light burn, 1st, 2nd degree burn, toxic epidermal necrolysis, pemphigus vulgaris.    DDX chest pain/pressure/tightness: includes but is not limited to unstable angina, acute coronary syndrome, acute myocardial infarction, congestive heart failure, arrhythmia, pericarditis, pericardial effusion, myocarditis,  thoracic aortic aneurysm or dissection, pulmonary embolism, pulmonary hypertension, bronchitis, pneumonia , pleural effusion, pneumothorax, cancer,  GERD, esophagitis, esophogeal rupture, gastrtitis, shingles,  adverse reaction to medications, anxiety, panic attack.     DDX Shortness of breath: includes but is not limited to upper airway foreign body, epiglottitis, retropharyngeal abscess, laryngospasm, vocal cord dysfunction, laryngeal malignancy, upper respiratory tract infection, Covid-19, COPD exacerbation,  asthma exacerbation, bronchitis, pneumonia, pneumothorax, pulmonary embolism, pulmonary hypertension, malignancy, pulmonary fibrosis, pleural effusion, congestive heart failure, acute mitral  valve regurgitation, aortic stenosis, unstable angina, acute coronary syndrome, acute myocardial infarction, arrhythmia, cardiomyopathy, pericarditis, myocarditis, pericardial effusion, anemia, diabetic ketoacidosis, anxiety, panic attack.     DDX Nausea: includes but is not limited to viral syndrome, atypical presentation of unstable angina, atypical presentation of arrhythmia, gastritis, PUD, biliary tract disease, pancreatitis, kidney stone, UTI, pyelonephritis, pregnancy, cervicitis, PID, glaucoma, adverse reaction to medication, withdrawal from multiple medications, ETOH withdraw, viral syndrome.   ED Course     ED Medication Orders (From admission, onward)    Start Ordered  Status Ordering Provider    01/12/20 1536 01/12/20 1535  cephalexin (KEFLEX) capsule 500 mg  Once in ED        Route: Oral  Ordered Dose: 500 mg     Last MAR action: Given Vola Beneke, Lum Keas           ED Course as of 01/13/20 1203   Wed Jan 12, 2020   2015 [facility] called they had run out of 5 mg Norco's.  Prescription discontinued and a prescription for 10 mg Norco's with one half a tablet given his symptoms electronically sent. [WR]      ED Course User Index  [WR] Tikesha Mort, Lum Keas, MD       Incision/Drainage    Date/Time: 01/12/2020 6:00 PM  Performed by: Bernette Redbird, MD  Authorized by: Bernette Redbird, MD     Consent:     Consent obtained:  Verbal    Consent given by:  Patient    Risks discussed:  Bleeding, incomplete drainage, pain and infection  Location:     Type:  Abscess (Left great toe paronychia, onychomycoses of nail)    Size:  1.5 x 0.5 cm    Location:  Lower extremity    Lower extremity location:  Toe    Toe location:  L big toe (distal phalanx)  Pre-procedure details:     Skin preparation:  Chloraprep and Hibiclens  Anesthesia (see MAR for exact dosages):     Anesthesia method:  Nerve block    Block location:  Digital    Block needle gauge:  27 G    Block anesthetic:  Lidocaine 2% WITH epi    Block technique:   Digital    Block injection procedure:  Anatomic landmarks identified, introduced needle, incremental injection, anatomic landmarks palpated and negative aspiration for blood    Block outcome:  Anesthesia achieved  Procedure type:     Complexity:  Simple  Procedure details:     Incision type: Blunt disection with iris scissors and curved Kelly.    Incision depth:  Subungual    Wound management:  Probed and deloculated    Drainage:  Serosanguinous    Drainage amount:  Scant    Wound treatment:  Wound left open and drain placed    Packing materials:  1/4 in gauze    Amount 1/4":  1 cm x 2 in eponychial space  Post-procedure details:     Patient tolerance of procedure:  Tolerated well, no immediate complications  Comments:      Nail easily and gently removed with blunt dissection, subungal fibrinous exudate removed with forceps, nail bed recleansed with normal saline, xeroform and tube gauze with loosely applied Coban applied.        Results     Labs Reviewed   VH DEXTROSE STICK GLUCOSE - Abnormal; Notable for the following components:       Result Value    Glucose POCT 66 (*)     All other components within normal limits   I-STAT CHEM 8 CARTRIDGE - Abnormal; Notable for the following components:    Potassium I-Stat 3.4 (*)     Glucose I-Stat 62 (*)     BUN I-Stat 24 (*)     Anion Gap I-Stat 17.0 (*)     All other components within normal limits   VH CULTURE AND SMEAR, WOUND   VH I-STAT TROPONIN NOTIFICATION   VH I-STAT CHEM 8 NOTIFICATION   I-STAT TROPONIN  No orders to display       Lab and Radiology results discussed with patient.       MDM   Left great toe redness, swelling: Suspect paronychia with superimposed on advanced onychomycoses.  Procedure for drainage of paronychia resulted in dislodgment of the thickened onychomycotic nail with  Nail removed.  The nailbed was cleansed thoroughly irrigated plain Nu Gauze was placed in the eponychial edge to allow for additional drainage Xeroform placed across the  nailbed tube gauze with protective Coban loosely applied. Outpatient follow-up with podiatry as indicated. See procedure note.   With surrounding erythema involving the distal phalanx,  antibiotic coverage for cellulitis indicated.  Culture taken and first dose of Keflex given in the emergency department.    Episodes of chest pain, shortness of breath, dyspnea on exertion diaphoresis dizziness fatigue weakness.  Patient has extensive history non-ischema cardiomyopathy,  sick sinus syndrome,  patient has a pacemaker in place.  Today's EKG nonischemic, troponin is negative.  Review of record demonstrates near clean left heart catheterization from 03/2018, low suspicion for coronary disease n(small vessel disease)  as etiology of presentation.  There may be a component of diabetic gastropathy, gastroparesis contributing.  Further evaluation and management as an outpatient with his established cardiologist and primary care physician indicated.    Insulin-dependent diabetes: Patient had borderline hypoglycemia sugar 66.  He was given p.o. diet.  Patient encouraged to continue with his regular ADA diet and diabetes regimen.    Questions answered and discharge instructions reviewed at bedside.     Vitals: Normotensive, borderline tachycardia on arrival, normal respiratory rate afebrile O2 sat 99% on room air.    Clinical Impression & Disposition     Clinical Impression  Final diagnoses:   Ingrown toenail of left foot, first toe   Paronychia of great toe of left foot   Chest pain, unspecified type   Cellulitis of great toe of left foot   Onychomycosis of great toe        ED Disposition     ED Disposition Condition Date/Time Comment    Discharge  Wed Jan 12, 2020  6:55 PM Samuel Koch discharge to home/self care.    Condition at disposition: Stable           Discharge Medication List as of 01/12/2020  6:55 PM      START taking these medications    Details   cephalexin (KEFLEX) 500 MG capsule Take 1 capsule (500 mg total)  by mouth 4 (four) times daily for 7 days, Starting Wed 01/12/2020, Until Wed 01/19/2020, E-Rx      HYDROcodone-acetaminophen (NORCO) 5-325 MG per tablet Take 1-2 tablets by mouth every 6 (six) hours as needed for Pain, Starting Wed 01/12/2020, Until Wed 01/19/2020 at 2359, E-Rx              Edmon Crape, MD  [address]  [place] [address]  [phone]    Call in 3 days  For re-check    Jannett Celestine, DPM  [address]  [place] [address]  [phone]    Call in 3 days  Podiatry follow up      The documentation recorded by my scribe, Dorothyann Gibbs, accurately reflects the services I personally performed and the decisions made by me Burna Cash, MD         Cloyde Reams Lum Keas, MD  01/13/20 [phone]

## 2020-01-15 LAB — VH CULTURE AND SMEAR, WOUND

## 2020-01-18 NOTE — ED Notes (Addendum)
Wound culture  reviewed with Dr Cloyde Reams.  Patient can D/C Keflex and start on Cipro 500 mg bid for 7 days.  Notified patient's wife and I called in Rx for Cipro to [facility] on [address].   Patient has appt with podiatry tomorrow I also faxed copy of culture result and change in antibiotics to Dr. Oswaldo Done, DPM.

## 2020-01-26 ENCOUNTER — Other Ambulatory Visit: Payer: Self-pay

## 2020-01-26 ENCOUNTER — Other Ambulatory Visit (INDEPENDENT_AMBULATORY_CARE_PROVIDER_SITE_OTHER): Payer: Self-pay | Admitting: Registered Nurse

## 2020-01-26 MED ORDER — SPIRONOLACTONE 25 MG PO TABS
25.0000 mg | ORAL_TABLET | Freq: Every day | ORAL | 0 refills | Status: DC
Start: 2020-01-26 — End: 2020-02-02

## 2020-01-26 NOTE — Telephone Encounter (Signed)
Pt requesting refill aldactone.  Was seen in 06/2019 and has upcoming appt in January.  Pt was given a 30 day Rx.    Warren Danes RN, Multimedia programmer  Phone:  [phone]  Fax:  [phone]

## 2020-02-02 ENCOUNTER — Other Ambulatory Visit: Payer: Self-pay

## 2020-02-02 ENCOUNTER — Telehealth (INDEPENDENT_AMBULATORY_CARE_PROVIDER_SITE_OTHER): Payer: Self-pay

## 2020-02-02 MED ORDER — POTASSIUM CHLORIDE ER 10 MEQ PO TBCR
10.0000 meq | EXTENDED_RELEASE_TABLET | Freq: Every day | ORAL | 3 refills | Status: DC
Start: 2020-02-02 — End: 2021-01-16

## 2020-02-02 MED ORDER — SPIRONOLACTONE 25 MG PO TABS
25.0000 mg | ORAL_TABLET | Freq: Every day | ORAL | 3 refills | Status: DC
Start: 2020-02-02 — End: 2020-04-25

## 2020-02-02 MED ORDER — LISINOPRIL 5 MG PO TABS
5.0000 mg | ORAL_TABLET | Freq: Every day | ORAL | 3 refills | Status: DC
Start: 2020-02-02 — End: 2021-02-19

## 2020-02-02 NOTE — Telephone Encounter (Signed)
Pt is aware that refill has been sent.

## 2020-02-02 NOTE — Telephone Encounter (Signed)
-----   Message from Karie Kirks sent at 02/02/2020 11:07 AM EST -----  Patient called and needs a refill on his Ozempic.    Thanks!

## 2020-02-05 ENCOUNTER — Encounter (INDEPENDENT_AMBULATORY_CARE_PROVIDER_SITE_OTHER): Payer: Self-pay

## 2020-02-05 ENCOUNTER — Ambulatory Visit (INDEPENDENT_AMBULATORY_CARE_PROVIDER_SITE_OTHER): Payer: 59 | Admitting: Family

## 2020-02-05 VITALS — Wt 330.0 lb

## 2020-02-05 DIAGNOSIS — Z7184 Encounter for health counseling related to travel: Secondary | ICD-10-CM

## 2020-02-05 DIAGNOSIS — Z7189 Other specified counseling: Secondary | ICD-10-CM

## 2020-02-05 DIAGNOSIS — Z20822 Contact with and (suspected) exposure to covid-19: Secondary | ICD-10-CM

## 2020-02-05 NOTE — Progress Notes (Signed)
Subjective:    Patient ID: Keneth Borg is a 50 y.o. male.       Telemedicine Documentation Requirements    This visit is being conducted via telephone.   Telemedicine visit completed via Telephone encounter  because of technical issue: no      Originating site (Patient location): Urgent Care Parking lot  Distant site (Provider location): Clinic on site    Parking lot swabbing: yes.   Provider assessment at parking lot: yes  Further evaluation of patient in clinic: no    Provider and Title:Faithlynn Deeley Naida Sleight, NP  Tele Evaluation Consent obtained: YES  Language, if applicable and if translator was required: N/A       Exam findings are documented from my visual observation and/or patient performing self exam under my guidance.         Traveling to [place], leaving on 02/07/2020.    No symptoms.  Needs a test to travel.    Received the [employer] COVID vaccine, 06/23/2019    He has COVID infection 01/2019.      The following portions of the patient's history were reviewed and updated as appropriate: allergies, current medications, past family history, past medical history, past social history, past surgical history and problem list.    Review of Systems   Constitutional: Negative for chills and fever.   HENT: Negative for rhinorrhea and sore throat.    Eyes: Negative for discharge.   Respiratory: Negative for cough and shortness of breath.    Cardiovascular: Negative for chest pain.   Gastrointestinal: Negative for diarrhea and vomiting.   Genitourinary: Negative for difficulty urinating.   Musculoskeletal: Negative for myalgias.   Skin: Negative for rash.   Neurological: Negative for headaches.         No Known Allergies    Past Medical History:   Diagnosis Date    Cardiomyopathy     Congestive heart failure     Diabetes mellitus     Hypertension     PVC's (premature ventricular contractions)        Past Surgical History:   Procedure Laterality Date    APPENDECTOMY (OPEN)      CARDIAC PACEMAKER PLACEMENT      RIGHT &  LEFT HEART CATH  W/ CORONARY ANGIOS, LV/LA Right 03/30/2018    Procedure: RIGHT & LEFT HEART CATH  W/ CORONARY ANGIOS, LV/LA;  Surgeon: Santo Held, MD;  Location: [facility] CATH/EP;  Service: Cardiovascular;  Laterality: Right;    RIGHT HEART CATH Right 06/08/2019    Procedure: RIGHT HEART CATH;  Surgeon: Adonis Brook, MD;  Location: [facility] CATH/EP;  Service: Cardiovascular;  Laterality: Right;  ARRIVAL TIME       Family History   Problem Relation Age of Onset    Heart disease Father     Hypertension Father     Heart disease Sister     Heart attack Sister     Heart disease Brother     Diabetes Brother     Diabetes Paternal Grandmother     No known problems Mother        Objective:    Wt 149.7 kg (330 lb) Comment: verbal   BMI 42.35 kg/m     Physical Exam  Vitals and nursing note reviewed.   Constitutional:       General: He is not in acute distress.     Appearance: He is not ill-appearing.   Pulmonary:      Effort: No respiratory  distress.      Comments: Able to speak in complete sentences.  No cough.  Neurological:      Mental Status: He is alert.   Psychiatric:         Behavior: Behavior is cooperative.           Assessment and Plan:       Joeph was seen today for covid-19 screening.    Diagnoses and all orders for this visit:    Counseled about COVID-19 virus infection  -     SARS-CoV-2 Assay ([employer] System(TM)) ([facility] ONLY); Future    Counseling for travel  -     SARS-CoV-2 Assay ([employer] System(TM)) ([facility] ONLY); Future      Staff went to the patient's car to collect PCR COVID.    Video chat deferred. I spoke with the patient on the phone, then went to their car for brief evaluation.    - Encouraged to check My Chart for results.    - If PCR is negative, may travel.    - If PCR is positive, recommend not traveling  -- If you remain asymptomatic, should quarantine for 5 days after test collection, before returning to work/school/traveling.  -- If you be come symptomatic, should  quarantine for 10 days after symptom onset and be 48 hours free of fever/chills/body aches/GI upset before returning to work/school/traveling.    Recommend quarantining while waiting for the COVID results.    Follow up for further evaluation if you start with symptoms.     Go to the ER for any new or worsening symptoms that concern you.  Follow-up with your primary care doctor or return to Urgent Care if your symptoms do not improve.    Patient agrees with the plan.              Carolin Sicks, NP  [facility]  02/05/2020  1:54 PM

## 2020-02-05 NOTE — Patient Instructions (Signed)
-   Encouraged to check My Chart for results.    - If PCR is negative, may travel.    - If PCR is positive, recommend not traveling  -- If you remain asymptomatic, should quarantine for 5 days after test collection, before returning to work/school/traveling.  -- If you be come symptomatic, should quarantine for 10 days after symptom onset and be 48 hours free of fever/chills/body aches/GI upset before returning to work/school/traveling.    Recommend quarantining while waiting for the COVID results.    Follow up for further evaluation if you start with symptoms.

## 2020-02-05 NOTE — Progress Notes (Signed)
Telehealth  yes  Telehealth to inperson no  POCT NA  PCR Completed  PCR collected [place]    I was wearing an N95, face shield, gloves, and a gown while swabbing the patient.     Libby Maw, LPN     Covid PCR np collected from patient by Libby Maw, LPN. This staff member prepared specimen, applied required charges and placed in courier box for lab courier to pick up.    Libby Maw, LPN

## 2020-02-06 ENCOUNTER — Other Ambulatory Visit
Admission: RE | Admit: 2020-02-06 | Discharge: 2020-02-06 | Disposition: A | Discharge status: Home / Self Care | Payer: 59 | Source: Ambulatory Visit | Attending: Family | Admitting: Family

## 2020-02-06 DIAGNOSIS — Z7184 Encounter for health counseling related to travel: Secondary | ICD-10-CM

## 2020-02-06 DIAGNOSIS — Z7189 Other specified counseling: Secondary | ICD-10-CM

## 2020-02-07 LAB — VH SARS-COV-2 ASSAY (PERKINELMER SYSTEM(TM))
Does patient have symptoms related to condition of interest?: NEGATIVE
Does patient reside in a congregate care setting?: NEGATIVE
Is patient employed in a healthcare setting?: NEGATIVE
Is the patient hospitalized because of this condition?: NEGATIVE
Is the patient pregnant?: NEGATIVE
SARS-CoV-2 Assay (PerkinElmer System (TM)): NOT DETECTED

## 2020-02-24 ENCOUNTER — Telehealth: Payer: Self-pay

## 2020-02-24 ENCOUNTER — Ambulatory Visit: Payer: 59 | Admitting: Family Nurse Practitioner

## 2020-02-24 NOTE — Telephone Encounter (Addendum)
-----   Message from Elizbeth Squires, NP sent at 02/23/2020  5:18 PM EST -----  Regarding: device  Needs appointment for device check and follow up Last check 11/2019.

## 2020-03-08 ENCOUNTER — Ambulatory Visit: Payer: 59

## 2020-03-08 ENCOUNTER — Ambulatory Visit
Admission: RE | Admit: 2020-03-08 | Discharge: 2020-03-08 | Disposition: A | Discharge status: Home / Self Care | Payer: 59 | Source: Ambulatory Visit | Attending: Family | Admitting: Family

## 2020-03-08 ENCOUNTER — Encounter: Payer: Self-pay | Admitting: Family

## 2020-03-08 ENCOUNTER — Other Ambulatory Visit
Admission: RE | Admit: 2020-03-08 | Discharge: 2020-03-08 | Disposition: A | Discharge status: Home / Self Care | Payer: 59 | Source: Ambulatory Visit | Attending: Family | Admitting: Family

## 2020-03-08 ENCOUNTER — Other Ambulatory Visit: Payer: Self-pay | Admitting: Cardiovascular Disease

## 2020-03-08 VITALS — BP 105/84 | HR 92 | Resp 16 | Wt 333.0 lb

## 2020-03-08 DIAGNOSIS — I495 Sick sinus syndrome: Secondary | ICD-10-CM

## 2020-03-08 DIAGNOSIS — Z950 Presence of cardiac pacemaker: Secondary | ICD-10-CM

## 2020-03-08 DIAGNOSIS — I493 Ventricular premature depolarization: Secondary | ICD-10-CM | POA: Insufficient documentation

## 2020-03-08 DIAGNOSIS — I5042 Chronic combined systolic (congestive) and diastolic (congestive) heart failure: Secondary | ICD-10-CM

## 2020-03-08 DIAGNOSIS — R0600 Dyspnea, unspecified: Secondary | ICD-10-CM | POA: Insufficient documentation

## 2020-03-08 DIAGNOSIS — I110 Hypertensive heart disease with heart failure: Secondary | ICD-10-CM | POA: Insufficient documentation

## 2020-03-08 DIAGNOSIS — I428 Other cardiomyopathies: Secondary | ICD-10-CM | POA: Insufficient documentation

## 2020-03-08 DIAGNOSIS — R0609 Other forms of dyspnea: Secondary | ICD-10-CM

## 2020-03-08 DIAGNOSIS — E1169 Type 2 diabetes mellitus with other specified complication: Secondary | ICD-10-CM

## 2020-03-08 DIAGNOSIS — R5383 Other fatigue: Secondary | ICD-10-CM

## 2020-03-08 DIAGNOSIS — E785 Hyperlipidemia, unspecified: Secondary | ICD-10-CM

## 2020-03-08 DIAGNOSIS — I10 Essential (primary) hypertension: Secondary | ICD-10-CM

## 2020-03-08 DIAGNOSIS — N289 Disorder of kidney and ureter, unspecified: Secondary | ICD-10-CM

## 2020-03-08 LAB — CBC AND DIFFERENTIAL
Basophils %: 0.6 % (ref 0.0–3.0)
Basophils Absolute: 0.1 10*3/uL (ref 0.0–0.3)
Eosinophils %: 0.9 % (ref 0.0–7.0)
Eosinophils Absolute: 0.1 10*3/uL (ref 0.0–0.8)
Hematocrit: 43.4 % (ref 39.0–52.5)
Hemoglobin: 14.5 gm/dL (ref 13.0–17.5)
Lymphocytes Absolute: 2.6 10*3/uL (ref 0.6–5.1)
Lymphocytes: 24.6 % (ref 15.0–46.0)
MCH: 31 pg (ref 28–35)
MCHC: 34 gm/dL (ref 32–36)
MCV: 92 fL (ref 80–100)
MPV: 8.3 fL (ref 6.0–10.0)
Monocytes Absolute: 0.9 10*3/uL (ref 0.1–1.7)
Monocytes: 8.3 % (ref 3.0–15.0)
Neutrophils %: 65.5 % (ref 42.0–78.0)
Neutrophils Absolute: 7 10*3/uL (ref 1.7–8.6)
PLT CT: 363 10*3/uL (ref 130–440)
RBC: 4.7 10*6/uL (ref 4.00–5.70)
RDW: 11.7 % (ref 11.0–14.0)
WBC: 10.6 10*3/uL (ref 4.0–11.0)

## 2020-03-08 LAB — VH I-STAT CG8 PLUS
BE, ISTAT: 2 mMol/L
Calcium Ionized I-Stat: 5.1 mg/dL (ref 4.35–5.10)
Glucose I-Stat: 99 mg/dL (ref 71–99)
HCO3, ISTAT: 25.5 mMol/L (ref 20.0–29.0)
Hematocrit I-Stat: 43 % (ref 39.0–52.5)
Hemoglobin I-Stat: 14.6 gm/dL (ref 13.0–17.5)
O2 Sat, %, ISTAT: 92 % — ABNORMAL HIGH (ref 40–70)
PCO2, ISTAT: 37.4 mm Hg — ABNORMAL LOW (ref 42.0–55.0)
PO2, ISTAT: 61 mm Hg — ABNORMAL HIGH (ref 25–35)
Potassium I-Stat: 4 mMol/L (ref 3.5–5.3)
Sodium I-Stat: 139 mMol/L (ref 136–147)
TCO2 I-Stat: 27 mMol/L (ref 24–29)
i-STAT FIO2: 21 %
pH, ISTAT: 7.44 — ABNORMAL HIGH (ref 7.32–7.42)

## 2020-03-08 LAB — I-STAT CREATININE
Creatinine I-Stat: 1.3 mg/dL (ref 0.80–1.30)
EGFR: 63 mL/min/{1.73_m2} (ref 60–150)

## 2020-03-08 LAB — THYROID STIMULATING HORMONE (TSH), REFLEX ON ABNORMAL TO FREE T4, SERUM: TSH: 0.98 u[IU]/mL (ref 0.40–4.20)

## 2020-03-08 MED ORDER — BUMETANIDE 2 MG PO TABS
ORAL_TABLET | ORAL | 3 refills | Status: DC
Start: 2020-03-08 — End: 2020-11-29

## 2020-03-08 MED ORDER — METFORMIN HCL 500 MG PO TABS
ORAL_TABLET | ORAL | 0 refills | Status: DC
Start: 2020-03-08 — End: 2020-12-05

## 2020-03-08 NOTE — Patient Instructions (Signed)
Changes for Today:  1. No changes to your  Medications today.  2. You will need to complete you device check  3. Labs check today, will call with the results.      Emergency Symptoms of Heart Failure  Call 911 for emergency help, if you have:    Marland Kitchen Chest discomfort or pain that lasts more than 15 minutes that is not relieved with rest or nitroglycerin.  . Severe, persistent shortness of breath.  . Fainted or passed out.    Urgent Symptoms of Heart Failure  Call your doctor immediately, if you have any of the following symptoms:    . Increasing shortness of breath or a new shortness of breath while resting.  . Trouble sleeping due to difficulty breathing. For example, waking up suddenly at night due to difficulty breathing.  . A need to sleep sitting up or on more pillows than usual.  . Fast or irregular heart beats, palpitations, or a "racing heart" that persists and makes you feel dizzy or lightheaded.  . Cough up frothy or pink sputum.  . Feel like you may faint or pass out.    Living with Heart Failure Information:    . Follow a sodium restricted diet. Eat no more than 2000 milligrams of sodium per day.  Read labels of packaged foods if you are not familiar with the sodium content per serving.   . Follow a 64 ounce fluid restriction. This is approximately equal to 2 liters, or 2000 cc's of fluid. Count all of the sources of fluid in your diet: drinks, fruits, vegetables, soups, etc.  . Weigh yourself daily.  . Report to our nurses a change in your weight of 2 pounds or more in one day, or 4 pounds or more in one week.  Call if you experience a significant change in your heart failure symptoms.    Activity:  We encourage you to engage in regular physical activity, such as daily walking. Start with short distances and walk at a comfortable pace. You should be able to "walk and talk". Gradually increase the distance and time that you walk. Exercise will help you feel better and can relieve the symptom of "heavy,  tired legs". Stop exercise if you have chest pain, dizziness, or get so short of breath that you can not talk.   You should not lift/push/or pull heavy objects (anything heavy enough to make you strain).     Medications:  Take all your medications as prescribed by your health care provider and bring all your medication bottles to every appointment. It is important that you know what you are taking, when, and for what reason.    Medications you should not take:  Nonsteroidal anti-inflammatory drugs (NSAIDS): such as ibuprofen, Advil, Motrin, naprosyn, Naproxen, may cause fluid retention. You should not take these medications.    Please call with any questions, concerns, or changes in symptoms.    Phone numbers:   - Clinic Office: [phone]

## 2020-03-08 NOTE — Progress Notes (Signed)
[facility]      follow-up Visit    Patient Name: Samuel Koch   Date of Birth: 18-Apr-1970    Provider: Jones Broom, NP     Primary Cardiologist: Lovey Newcomer. [name], DO    Chief Complaint:  Shortness of breath  Subjective     HPI:  I had the pleasure of seeing Samuel Koch today at [facility] for an advanced heart failure visit.  Samuel Koch is a 50 y.o. male with non-ischemic cardiomyopathy,  EF 50-55% previous 30% (2017), NYHA Class 3, ACC/AHA Stage C.  Samuel Koch also  has a past medical history of Cardiomyopathy, Congestive heart failure, Diabetes mellitus, Hypertension, and PVC's (premature ventricular contractions).      From a cardiovascular standpoint, Samuel Koch states has not done well.      Last visit was oct 2021.  He has been traveling a lot with his job, was in [place] for a while.  Weight is stable at home.  He continues to have shortness of breath when he walks around.  His biggest complaint is dizziness.  Dizziness with standing, while he is sitting down.  He is also reporting fatigue that he feels is getting worse.  He is not able to walk more than 5 minutes before he has to sit down.  Anytime he works out he is wiped out for the whole day.  He was outside chopping wood for 30 minutes and afterwards felt tired for the rest of the day.  He does not exercise as a result of his fatigue.  He was diagnosed with COVID-19 infection back in December 2021.  He had the fatigue prior to his diagnosis but does feel like it is a little bit worse since.    He has had no hospital admissions or emergency room visits.    Samuel Koch reports that he has not been exercising.  He does check blood pressures at home.  He has been checking morning weights; his weight has not been stable.  Samuel Koch has been following a fluid restriction of 2000 mL per day and has restricting dietary sodium content to no more than 2 gm/day.  He reports that he has been  compliant with his medications.        Review of Systems   Constitutional: Positive for malaise/fatigue. Negative for chills and weight loss.   Respiratory: Positive for shortness of breath.    Cardiovascular: Positive for orthopnea. Negative for chest pain, palpitations and leg swelling.   Gastrointestinal: Negative for diarrhea, nausea and vomiting.   Musculoskeletal: Negative for myalgias.   Neurological: Positive for dizziness.   All other systems reviewed and are negative.     Comprehensive 12 point review of system is negative unless described above in HPI    Review of Cardiovascular Testing:  1. Chronic combined systolic and diastolic CHF     2. Nonischemic cardiomyopathy   A. LHC (6/17): 30% mid RCA, no other significant disease.   B. Echo (6/17): EF 30-35%.   C. Holter (7/17): 19% PVCs.   D. Echo (3/18): EF 50-55%, moderate LVH, grade II diastolic dysfunction.   E. Echo (9/18): EF 50-55%   F. Echo 10/07/17- LV cavity size is increased. The LVEF is est. at 50%. Mild hypokinesis of the distal septum and apex which may be due to paced rhythm. Pacer wire present in RV.   G. Echo 06/12/2018- Mild concentric LVH, EF 50-55%, aortic root is mildy  dilated, aortic root is 3.9cm, tricuspid regurgitation is mild, RSVP .   H. RHC 06/08/2019 co 5.9/ ci 2.2; wedge 16; mean pa 21  I. Echo 08/13/19  ef  50- 55%. right ventricle is mildly dilated in size with normal systolic function.  A pacer wire is present in the right ventricle. No significant valvular heart disease or evidence for pulmonary hypertension.      3. Type II diabetes, poorly controlled     4. Hypertriglyceridemia     5. PVCs   A. 48 hour holter (7/17) with 19% PVCs.   B. PVC ablation in 8/17 but PVCs recurred.    C. Holter (11/17) with rare PVCs, < 1%.   D. Holter (9/18) with rare PVCs only     6. Bradycardia/ SSS   A. Dual chamber pacemaker placed (Medtronic, MRI compatible).     7. NSVT     8. CAD evaluation   A. Echo 03/27/2018- EF 50%, no regional wall  motion abnormalities present, grade 2 DD. no significant valvular heart disease.   B. Stress 03/28/2018- Completed vasodilator protocol., No ischemic changes with infusion, No arrhythmias,low risk. Perfusion defect noted thought to be diaphragmatic attenuation Dilated LV with mildly decreased left ventricular function, stress EF 44%.   C. THC 03/30/2018- Non obstructive cad. CO 5 CI 2.0 wedge 14 mean PA 22       Past Surgical History:   has a past surgical history that includes APPENDECTOMY (OPEN); Cardiac pacemaker placement; Right & Left Heart Cath  w/ Coronary Angios, LV/LA (Right, 03/30/2018); and Right Heart Cath (Right, 06/08/2019).    Family History:  family history includes Diabetes in his brother and paternal grandmother; Heart attack in his sister; Heart disease in his brother, father, and sister; Hypertension in his father; No known problems in his mother.    Social History:   reports that he has quit smoking. He has quit using smokeless tobacco. He reports that he does not drink alcohol and does not use drugs.    Allergies:  has No Known Allergies.    Medications:    Current Outpatient Medications:   .  Blood Glucose Monitor, generic, Kit, Use as directed to monitor blood sugar, Disp: 1 kit, Rfl: 1  .  Blood Glucose Monitoring Suppl (OneTouch Ultra 2) w/Device Kit, USE AS DIRECTED TO MONITOR BLOOD SUGAR, Disp: , Rfl:   .  bumetanide (BUMEX) 2 MG tablet, Take 2 tablets (4 mg total) by mouth every morning AND 2 tablets (4 mg total) Daily after lunch. (Patient taking differently: Take 1 tablets (2 mg total) by mouth every morning AND 2 tablets (4 mg total) Daily after lunch.), Disp: 270 tablet, Rfl: 3  .  Continuous Blood Gluc Sensor (FreeStyle Libre 2 Sensor Systm) Misc, 1 Device by Does not apply route every 14 (fourteen) days, Disp: 2 each, Rfl: 12  .  dilTIAZem (Cardizem CD) 180 MG 24 hr capsule, Take 1 capsule (180 mg total) by mouth daily, Disp: 90 capsule, Rfl: 3  .  glucose blood test strip, Check BS  5 times daily, Disp: 150 each, Rfl: 11  .  insulin glargine (LANTUS) 100 UNIT/ML injection, Inject 36 Units into the skin every 12 (twelve) hours, Disp: 75 mL, Rfl: 3  .  insulin lispro, 1 Unit Dial, (HumaLOG KwikPen) 100 UNIT/ML Solution Pen-injector injection pen, 15 units TID pre meals plus sliding scale 2:50>150 with max of 75 units per day, Disp: 60 mL, Rfl: 3  .  Insulin Pen Needle (  PEN NEEDLES 3/16") 31G X 5 MM Misc, 1 each by Does not apply route 4 (four) times daily, Disp: 901 each, Rfl: 1  .  Insulin Syringes, Disposable, U-100 1 ML Misc, 1 each by Does not apply route 2 (two) times daily, Disp: 180 each, Rfl: 0  .  Lancets Ultra Fine Misc, Check BS 5 times daily, Disp: 150 each, Rfl: 11  .  lisinopril (ZESTRIL) 5 MG tablet, Take 1 tablet (5 mg total) by mouth daily, Disp: 90 tablet, Rfl: 3  .  metFORMIN (GLUCOPHAGE) 500 MG tablet, 2 tablets (1000 mg) with breakfast and 1 tablets (500 mg) with dinner, Disp: 270 tablet, Rfl: 3  .  Ozempic, 0.25 or 0.5 MG/DOSE, 2 MG/1.5ML Solution Pen-injector, INJECT 0.5MG  INTO THE SKIN ONCE A WEEK, Disp: 4.5 mL, Rfl: 3  .  potassium chloride (K-DUR) 10 MEQ tablet, Take 1 tablet (10 mEq total) by mouth daily, Disp: 90 tablet, Rfl: 3  .  rosuvastatin (CRESTOR) 5 MG tablet, Take 1 tablet (5 mg total) by mouth daily, Disp: 90 tablet, Rfl: 1  .  spironolactone (ALDACTONE) 25 MG tablet, Take 1 tablet (25 mg total) by mouth daily, Disp: 90 tablet, Rfl: 3       Objective       Vitals:    Visit Vitals  BP 105/84   Pulse 92   Resp 16   Wt 151 kg (333 lb)   SpO2 97%   BMI 42.74 kg/m       Wt Readings from Last 7 Encounters:   03/08/20 151 kg (333 lb)   02/05/20 149.7 kg (330 lb)   01/12/20 154.3 kg (340 lb 2.7 oz)   01/12/20 153.9 kg (339 lb 4.6 oz)   12/03/19 153.5 kg (338 lb 8 oz)   11/18/19 153.3 kg (338 lb)   09/09/19 150.2 kg (331 lb 3.2 oz)       Physical Exam:   General: well-developed, overweight male who is alert, NAD  HEENT:  Anicteric sclera  Neck:  JVP ~ 7 mmHg, no  carotid bruits. Carotid pulses are 2+ bilaterally  Lungs: CTA B/L, no rhonchi, wheezes or crackles with normal rate and effort  Cardiovascular: s1s2, normal rate, regular rhythm, no m/r/g, PMI laterally displaced  Abdominal: soft, NT/ND, +B.S., no HSM.   Extremities: warm to touch, no cyanosis, no edema, peripheral pulses are 2+ bilaterally.  Neuromuscular exam: grossly non-focal, though not formally tested.      Laboratory Data:  Lab Results   Component Value Date/Time    NA 137 01/08/2019 11:31 PM    K 4.2 01/08/2019 11:31 PM    CL 105 01/08/2019 11:31 PM    CO2 25 01/08/2019 11:31 PM    CA 8.7 01/08/2019 11:31 PM    GLU 166 (H) 01/08/2019 11:31 PM    CREAT 1.20 01/12/2020 03:55 PM    CREAT 1.05 01/08/2019 11:31 PM    BUN 16 01/08/2019 11:31 PM    PROT 7.1 12/26/2018 06:20 PM    ALB 4.2 12/26/2018 06:20 PM    ALKPHOS 67 12/26/2018 06:20 PM    ALT 24 12/26/2018 06:20 PM    AST 15 12/26/2018 06:20 PM    BILITOTAL 0.3 12/26/2018 06:20 PM    AGRATIO 1.45 12/26/2018 06:20 PM    ANIONGAP 11.2 01/08/2019 11:31 PM    BUNCRRATIO 15.2 01/08/2019 11:31 PM    EGFR 70 01/12/2020 03:55 PM    OSMO 279 01/08/2019 11:31 PM    GLOB 2.9 12/26/2018  06:20 PM         Additional Non-ischemic w/u panel:  Lab Results   Component Value Date/Time    TSH 1.300 11/05/2018 12:00 AM       No results found for: HIVAGAB    No results found for: HCVAB, HBV, HEPBCOREAB             Lab Results   Component Value Date/Time    WBC 4.4 01/08/2019 11:31 PM    RBC 4.31 01/08/2019 11:31 PM    HGB 13.2 01/08/2019 11:31 PM    HCT 39.5 01/08/2019 11:31 PM    PLT 207 01/08/2019 11:31 PM     Lab Results   Component Value Date/Time    CHOL 191 01/08/2020 10:40 AM    TRIG 279 (H) 01/08/2020 10:40 AM    TRIG 228 (H) 03/26/2018 04:24 PM    HDL 40 01/08/2020 10:40 AM    LDL 103 (H) 01/08/2020 10:40 AM     Lab Results   Component Value Date/Time    HGBA1CPERCNT 8.2 03/26/2018 04:24 PM     Lab Results   Component Value Date/Time    BNP <10.0 01/08/2019 11:31 PM               Today's I-stat Labs:  Results     Procedure Component Value Units Date/Time    I-Stat CG8 Plus [[id number]]  (Abnormal) Collected: 03/08/20 0946    Specimen: Venipuncture Updated: 03/08/20 0950     pH, ISTAT 7.44     PO2, ISTAT 61 mm Hg      BE, ISTAT 2 mMol/L      HCO3, ISTAT 25.5 mMol/L      PCO2, ISTAT 37.4 mm Hg      O2 Sat, %, ISTAT 92 %      i-STAT Allen's Test N/A     i-STAT FIO2 21.00 %      Sample I-Stat Venous     Site I-Stat L Brachial     Sodium I-Stat 139 mMol/L      Potassium I-Stat 4.0 mMol/L      TCO2 I-Stat 27 mMol/L      Calcium Ionized I-Stat 5.10 mg/dL      Glucose I-Stat 99 mg/dL      Hematocrit I-Stat 43.0 %      Hemoglobin I-Stat 14.6 gm/dL      Operator, ISTAT Operator: [id number] KNOWLES HILARY CHF TECH     CPB, I-Stat No    i-Stat Creatinine [[id number]] Collected: 03/08/20 0946    Specimen: Blood Updated: 03/08/20 0950     Creatinine I-Stat 1.30 mg/dL      EGFR 63 AO/ZHY/[id number]                Assessment and Plan:  1. NICM (nonischemic cardiomyopathy)  2. Chronic combined systolic and diastolic HF (heart failure)  Stage c, nyha III. Likely PVC induced.  He is s/p ablation for this. Ef now recovered 50-55% from 30-35%. rhc in 5/21 shows slightly elevated filling pressures ra 12, pa 30/12 (21), pcwp 16, co/ci 5.9/2.2.  Currently does not appear to be well compensated.  Has not had a device check since October.  He has been on gdmt for heart failure.      In terms of gdmt:  -does not tolerate bb due to severe fatigue  -lisinopril 5 mg daily  -spironolactone 25 mg daily  -bumex 2 mg and 4 mg in afternoon  -will hold off on sglt2i for now due to dizziness  and his recovered EF.    3. Essential hypertension  Well controlled.    4. Renal insufficiency  Stable.  Baseline     5. Obesity, morbid, BMI 40.0-49.9  Encouraged to exercise    6. DOE (dyspnea on exertion)      7. PVC (premature ventricular contraction)  8. SSS (sick sinus syndrome)  9. Pacemaker  Last device interrogation was oct 2021,  he has been having increase episodes of dizziness, he is not orthostatic in the office today, called the cardiology office, they are able to see him at 11 am for device check.    10. Fatigue   Unsure cause at this point, will send labs, cbc, tsh.  Last sleep study was 2017, may need to consider another.  If nothing else can consider repeating echocardiogram since his COVID diagnosis to make sure his EF remains normal.      I do not feel that he has a poor short term cardiac prognosis and do not feel that he warrants consideration for advanced heart failure therapies including mechanical circulatory support or heart transplantation.   We will be monitoring closely for changes in heart failure symptoms, electrolyte abnormalities and arrhythmias.    -HF Education: he has been given heart failure education and to include sodium and fluid restrictions as well as weighing daily.  he can call the clinic if she has any questions regarding medications.     -Devices:  he  is not a candidate for cardiac resynchronization therapy.  Has ppm in placed, see above.    -Exercise :  he  is not a candidate for cardiac rehabilitation.    -Counseling:  Provided in discharge instructions.    -Follow up: 1 week for follow up and check in    Orders Placed This Encounter   Procedures   . I-Stat CG8 Plus     Standing Status:   Standing     Number of Occurrences:   1   . CBC and differential     Standing Status:   Future     Standing Expiration Date:   03/08/2021     Order Specific Question:   Release to patient     Answer:   Immediate   . TSH, Abn Reflex to Free T4, Serum     Standing Status:   Future     Standing Expiration Date:   03/08/2021     Order Specific Question:   Release to patient     Answer:   Immediate   . i-Stat Creatinine     Standing Status:   Standing     Number of Occurrences:   1         Our office is available to answer any questions you or the patient may have. We appreciate the opportunity to participate in the care of  your patients.    Electronically signed by:    Toma Deiters, AGACNP-BC  VAD Program Coordinator  Advanced Heart Failure Program    [phone] (office) M-F 8a-5p; Pager# [phone] (HF Clinic)    [facility]  [address]  [place], [place] [address]       Note: This chart was generated by the [facility] EMR system/speech recognition and may contain inherit omission or errors not intended by the user.  Grammatical errors, random word insertions, deletions, pronoun errors and incomplete sentences are occasionally consequences of this technology due to software limitations.  Not all errors are caught or corrected.  If there  are questions or concerns about the content of this note or information contained in the body of this dictation they should be addressed directly with the author for clarification.

## 2020-03-20 ENCOUNTER — Other Ambulatory Visit: Payer: Self-pay

## 2020-03-20 ENCOUNTER — Telehealth: Payer: Self-pay

## 2020-03-20 NOTE — Telephone Encounter (Signed)
Aldactone refilled     Warren Danes RN, BSN  Heart Failure Navigator  Phone:  [phone]  Fax:  [phone]

## 2020-03-30 ENCOUNTER — Other Ambulatory Visit (INDEPENDENT_AMBULATORY_CARE_PROVIDER_SITE_OTHER): Payer: Self-pay | Admitting: Registered Nurse

## 2020-03-30 DIAGNOSIS — E1165 Type 2 diabetes mellitus with hyperglycemia: Secondary | ICD-10-CM

## 2020-04-12 ENCOUNTER — Other Ambulatory Visit (INDEPENDENT_AMBULATORY_CARE_PROVIDER_SITE_OTHER): Payer: Self-pay

## 2020-04-12 DIAGNOSIS — E1165 Type 2 diabetes mellitus with hyperglycemia: Secondary | ICD-10-CM

## 2020-04-12 MED ORDER — INSULIN GLARGINE 100 UNIT/ML SC SOLN
36.0000 [IU] | Freq: Two times a day (BID) | SUBCUTANEOUS | 3 refills | Status: DC
Start: 2020-04-12 — End: 2020-05-15

## 2020-04-25 ENCOUNTER — Other Ambulatory Visit: Payer: Self-pay

## 2020-04-25 MED ORDER — SPIRONOLACTONE 25 MG PO TABS
25.0000 mg | ORAL_TABLET | Freq: Every day | ORAL | 3 refills | Status: DC
Start: 2020-04-25 — End: 2020-08-17

## 2020-04-25 MED ORDER — DILTIAZEM HCL ER COATED BEADS 180 MG PO CP24
180.0000 mg | ORAL_CAPSULE | Freq: Every day | ORAL | 3 refills | Status: DC
Start: 2020-04-25 — End: 2020-07-19

## 2020-05-01 ENCOUNTER — Other Ambulatory Visit (INDEPENDENT_AMBULATORY_CARE_PROVIDER_SITE_OTHER): Payer: Self-pay | Admitting: Registered Nurse

## 2020-05-02 ENCOUNTER — Other Ambulatory Visit (INDEPENDENT_AMBULATORY_CARE_PROVIDER_SITE_OTHER): Payer: Self-pay

## 2020-05-02 ENCOUNTER — Encounter (INDEPENDENT_AMBULATORY_CARE_PROVIDER_SITE_OTHER): Payer: Self-pay

## 2020-05-02 DIAGNOSIS — E1165 Type 2 diabetes mellitus with hyperglycemia: Secondary | ICD-10-CM

## 2020-05-02 DIAGNOSIS — E782 Mixed hyperlipidemia: Secondary | ICD-10-CM

## 2020-05-02 LAB — LIPID PANEL, WITHOUT TOTAL CHOLESTEROL/HDL RATIO, SERUM
Cholesterol: 206 mg/dL — ABNORMAL HIGH (ref 100–199)
HDL: 36 mg/dL — ABNORMAL LOW (ref >39)
LDL Chol Calculated (NIH): 79 mg/dL (ref 0–99)
Triglycerides: 574 mg/dL (ref 0–149)
VLDL Calculated: 91 mg/dL — ABNORMAL HIGH (ref 5–40)

## 2020-05-02 LAB — HEMOGLOBIN A1C: Hemoglobin A1C: 7.5 % — ABNORMAL HIGH (ref 4.8–5.6)

## 2020-05-04 NOTE — Progress Notes (Signed)
Will review lab results at upcoming appointment.

## 2020-05-08 ENCOUNTER — Ambulatory Visit (INDEPENDENT_AMBULATORY_CARE_PROVIDER_SITE_OTHER): Payer: Self-pay

## 2020-05-12 ENCOUNTER — Ambulatory Visit (INDEPENDENT_AMBULATORY_CARE_PROVIDER_SITE_OTHER): Payer: 59 | Admitting: Registered Nurse

## 2020-05-15 ENCOUNTER — Encounter (INDEPENDENT_AMBULATORY_CARE_PROVIDER_SITE_OTHER): Payer: Self-pay | Admitting: Registered Nurse

## 2020-05-15 ENCOUNTER — Ambulatory Visit: Payer: 59 | Attending: Registered Nurse | Admitting: Registered Nurse

## 2020-05-15 VITALS — BP 112/79 | HR 81 | Temp 98.0°F | Resp 16 | Ht 74.02 in | Wt 335.0 lb

## 2020-05-15 DIAGNOSIS — G629 Polyneuropathy, unspecified: Secondary | ICD-10-CM

## 2020-05-15 DIAGNOSIS — E782 Mixed hyperlipidemia: Secondary | ICD-10-CM

## 2020-05-15 DIAGNOSIS — Z794 Long term (current) use of insulin: Secondary | ICD-10-CM

## 2020-05-15 DIAGNOSIS — E1165 Type 2 diabetes mellitus with hyperglycemia: Secondary | ICD-10-CM

## 2020-05-15 MED ORDER — OZEMPIC (1 MG/DOSE) 2 MG/1.5ML SC SOPN
1.0000 mg | PEN_INJECTOR | SUBCUTANEOUS | 3 refills | Status: DC
Start: 2020-05-15 — End: 2021-09-13

## 2020-05-15 MED ORDER — INSULIN LISPRO (1 UNIT DIAL) 100 UNIT/ML SC SOPN
PEN_INJECTOR | SUBCUTANEOUS | 3 refills | Status: DC
Start: 2020-05-15 — End: 2021-06-22

## 2020-05-15 MED ORDER — PEN NEEDLES 3/16" 31G X 5 MM MISC
3 refills | Status: AC
Start: 2020-05-15 — End: ?

## 2020-05-15 MED ORDER — LANTUS SOLOSTAR 100 UNIT/ML SC SOPN
PEN_INJECTOR | SUBCUTANEOUS | 3 refills | Status: DC
Start: 2020-05-15 — End: 2021-06-01

## 2020-05-15 NOTE — Patient Instructions (Signed)
Continue Lantus 36 units twice a day    Continue Humalog before meal:  14 units plus add scale of 2 units for every 50 over 150    Increase Ozempic to 1 mg weekly    Continue Metformin 1000 mg every morning and 500 in the evening,  try to increase to 1000 mg in the evening        If having hypoglycemia (low blood sugar with symptoms of shakiness, sweatiness, confusion, feeling weak), remember to follow the Rule of 15.  Eat or drink 15 grams of fast-acting carbohydrates (such as 4 glucose tablets or 4 ounces of juice or regular soda) and sit down.    Recheck blood sugar in 15 minutes and if still low, repeat eating or drinking 15 grams of fast-acting carbohydrates.    Then plan to eat your regular meal or protein to keep your blood sugar from dropping low.     Always bring a list of blood sugar readings (some before meals and some 2 hours after meals) and your glucose meter to each appointment.   Seeing your blood sugars is how we decide if your medications need to be adjusted.       Be sure to watch your diet and portion sizes of food.   Avoid regular soda and juice as these have a large amount of sugar.   Drinking water helps to lower your blood sugars.

## 2020-05-15 NOTE — Progress Notes (Signed)
Endocrinology Follow Up Note    Patient Name:  Samuel Koch [[id number]] DOB: [birth year]  Date: 05/15/2020    Subjective:    Samuel Koch is a 50 y.o. male here for follow up of mildly  uncontrolled type 2 diabetes mellitus.  Diabetes was diagnosed 2017.    Hgb A1c 7.5% on 05/01/20, previously 7.0% pm 01/08/20, 7.7% on 05/21/19, 10.8% on 11/08/18, 8.5% on 04/14/18, 8.3% on 04/02/18, 8.2% on 03/26/18.   Hgb A1c goal based on age and comorbidities is <7%.  Complications from diabetes include neuropathy and erectile dysfunction.  He has hyperlipidemia, hypertriglyceridemia and CHF and followed by the heart failure clinic.    Current Diabetes Medications Include:  -Lantus 36 units BID  -Humalog 14 units TID plus 2:50>150  -Metformin 1000 mg AM and 500 mg PM    -Ozempic 0.5 mg weekly injections     Patient is tolerating medications well.   He ran out of Lantus while traveling and miss 2 weeks worth.  Also rations is Humalog while traveling    Did not bring blood sugars or meter.   Does not routinely check at other times of the day    Diet, Exercise, and Weight:     The patient tries to watch portions, starches and sweets.  He travels a lot and does not always eat well.  He drinks coffee and water.  He does not drink surgery drinks.    He has been busy with his job and not exercising regularly.   At one time he was 350 lbs, got down to 295 lbs and is now up to 335 lbs, down 4 lbs since 01/2020.  He did consider weight loss surgery but he knows his insurance does not cover it as he has already checked.      Diabetic Education: He did met with Fontaine No, [facility] DM program CDE on 12/28/18.         Diabetic Complication Screening/Treatment  Eye exam:    Denies vision problems.   Knows he is due for an eye exam as it has been over a year since last.  Denies diabetic retinopathy.     Urine microalbumin: negative;  Renal labs on 03/08/20 demonstrate Creat 1.30, eGFR 63.  Microalbumin/creat ratio was 7 on 01/08/20.      Foot Care:  Positive for neuropathy and states he has numbness in he toes.  Does not take any medications.   He cares for his feet with the help of his wife. Denies wounds or ulcers.   Had left great toenail removed on 01/12/20 due to infection under the nail.  Now healed.   Monofilament: partially intact Completed on 11/05/18.    Aspirin Therapy: Does not take ASA    Cardiovascular risk factors: diabetes mellitus, dyslipidemia, family history of premature cardiovascular disease, hypertension, male gender and obesity (BMI >= 30 kg/m2);  Negative for smoking.  Negative for alcohol use.   He has CHF, cardiomyopathy and is followed heart failure clinic.  He had a right heart cath 06/08/19.  He has a pacemaker that was implanted for SSS and is followed by Dr. [name].      Statin therapy: The patient is taking statin therapy as indicated in the medication list below.  Lipid panel on 05/01/20 demonstrates Chol 206, Trig 574 (up from 276), HDL 36, LDL 79.  Had been on atorvastatin 40 mg daily but not able to tolerate the side effects.  Then was started on rosuvastitin low dose to  see if he could tolerate but he has not taken this yet.       Ace Inhibitor/ARB: The patient is taking ACE-I/ARB at dose indicated in medication list and is tolerating this well. Currently taking lisinopril 10 mg daily    Current Outpatient Medications   Medication Sig Dispense Refill    Blood Glucose Monitor, generic, Kit Use as directed to monitor blood sugar 1 kit 1    Blood Glucose Monitoring Suppl (OneTouch Ultra 2) w/Device Kit USE AS DIRECTED TO MONITOR BLOOD SUGAR      bumetanide (BUMEX) 2 MG tablet Take 1 tablets (2 mg total) by mouth every morning AND 2 tablets (4 mg total) Daily after lunch. 270 tablet 3    Continuous Blood Gluc Sensor (FreeStyle Libre 2 Sensor Systm) Misc 1 Device by Does not apply route every 14 (fourteen) days 2 each 12    dilTIAZem (Cardizem CD) 180 MG 24 hr capsule Take 1 capsule (180 mg total) by mouth in the morning. 90  capsule 3    glucose blood test strip Check BS 5 times daily 150 each 11    Insulin Syringes, Disposable, U-100 1 ML Misc 1 each by Does not apply route 2 (two) times daily 180 each 0    Lancets Ultra Fine Misc Check BS 5 times daily 150 each 11    lisinopril (ZESTRIL) 5 MG tablet Take 1 tablet (5 mg total) by mouth daily 90 tablet 3    metFORMIN (GLUCOPHAGE) 500 MG tablet 2 tablets (1000 mg) with breakfast and 1 tablets (500 mg) with dinner 90 tablet 0    potassium chloride (K-DUR) 10 MEQ tablet Take 1 tablet (10 mEq total) by mouth daily 90 tablet 3    rosuvastatin (CRESTOR) 5 MG tablet Take 1 tablet (5 mg total) by mouth daily 90 tablet 1    spironolactone (ALDACTONE) 25 MG tablet Take 1 tablet (25 mg total) by mouth in the morning. 90 tablet 3    insulin glargine (LANTUS SOLOSTAR) 100 UNIT/ML injection pen 36 units twice a day 75 mL 3    insulin lispro, 1 Unit Dial, (HumaLOG) 100 UNIT/ML Solution Pen-injector injection pen INJECT 14 UNITS PLUS SLIDING SCALE 2:50>150 THREE TIMES DAILY BEFORE MEALS. Max 75 U daily. 75 mL 3    Insulin Pen Needle (Pen Needles 3/16") 31G X 5 MM Misc 1 each 5 times per day 450 each 3    Semaglutide, 1 MG/DOSE, (Ozempic, 1 MG/DOSE,) 2 MG/1.5ML Solution Pen-injector Inject 1 mg into the skin once a week 9 mL 3     No current facility-administered medications for this visit.      Medication review with Patricia Nettle Frasier suggested compliance most of the time.     Immunization History   Administered Date(s) Administered    COVID-19 Ad26 Vaccine Preservative Free 0.5 mL (JANSSEN) 06/23/2019     The following portions of the patient's history were reviewed and updated as appropriate: allergies, current medications, past family history, past medical history, past social history, past surgical history and problem list.  The following information was also reviewed at today's visit: office notes from referring provider, hospital records and lab data    Review of Systems  Review of  Systems   Constitutional: Positive for malaise/fatigue. Negative for weight loss.   HENT: Negative for congestion and sore throat.    Eyes: Negative for blurred vision.   Respiratory: Negative for cough and shortness of breath.    Cardiovascular: Negative for chest pain,  palpitations and leg swelling.   Gastrointestinal: Negative for abdominal pain, constipation, diarrhea, nausea and vomiting.   Genitourinary: Negative for dysuria, frequency and hematuria.   Musculoskeletal: Negative for back pain, joint pain and myalgias.   Skin: Negative for rash.   Neurological: Positive for tingling (feet). Negative for tremors, weakness and headaches.   Endo/Heme/Allergies: Negative for polydipsia.   Psychiatric/Behavioral: Negative for depression. The patient is not nervous/anxious and does not have insomnia.         Objective:      Vital Signs: BP 112/79    Pulse 81    Temp 98 F (36.7 C) (Temporal)    Resp 16    Ht 1.88 m (6' 2.02")    Wt 152 kg (335 lb)    BMI 42.99 kg/m   Physical Exam  Vitals and nursing note reviewed.   Constitutional:       General: He is not in acute distress.  HENT:      Head: Normocephalic.   Eyes:      Conjunctiva/sclera: Conjunctivae normal.      Pupils: Pupils are equal, round, and reactive to light.   Neck:      Thyroid: No thyromegaly.   Cardiovascular:      Rate and Rhythm: Normal rate and regular rhythm.      Pulses:           Radial pulses are 2+ on the right side and 2+ on the left side.      Heart sounds: Normal heart sounds, S1 normal and S2 normal.   Pulmonary:      Effort: Pulmonary effort is normal.      Breath sounds: Normal breath sounds.   Musculoskeletal:         General: Normal range of motion.      Cervical back: Normal range of motion and neck supple.   Lymphadenopathy:      Cervical: No cervical adenopathy.   Skin:     General: Skin is warm and dry.   Neurological:      Mental Status: He is alert and oriented to person, place, and time.      Comments:      Psychiatric:          Mood and Affect: Mood and affect normal.         Cognition and Memory: Memory normal.         Judgment: Judgment normal.          Lab Review    Lab Results   Component Value Date    HGBA1CPERCNT 8.2 03/26/2018    HGBA1CPERCNT 10.7 10/06/2017    HGBA1C 7.5 (H) 05/01/2020    HGBA1C 7.0 (H) 01/08/2020    HGBA1C 10.8 (H) 11/05/2018    TSH 0.98 03/08/2020    CHOL 206 (H) 05/01/2020    HDL 36 (L) 05/01/2020    LDL 79 05/01/2020    TRIG 440 (HH) 05/01/2020    ALT 24 12/26/2018    GLU 166 (H) 01/08/2019    K 4.2 01/08/2019    CA 8.7 01/08/2019    CREAT 1.30 03/08/2020    CO2 25 01/08/2019    EGFR 63 03/08/2020    NA 137 01/08/2019    ALKPHOS 67 12/26/2018          Impression and Recommendations:    1. Uncontrolled type 2 diabetes mellitus with hyperglycemia  - insulin lispro, 1 Unit Dial, (HumaLOG) 100 UNIT/ML Solution Pen-injector injection pen; INJECT 14 UNITS  PLUS SLIDING SCALE 2:50>150 THREE TIMES DAILY BEFORE MEALS. Max 75 U daily.  Dispense: 75 mL; Refill: 3  - Hemoglobin A1C; Future    2. Long term current use of insulin    3. Neuropathy    4. Mixed hyperlipidemia    -Does not have blood sugar readings today;   A1c up to 7.5%  -Continue Lantus to 36 units BID  -Continue Humalog 14 units TID  Plus correction scale 2:50>150  -Continue Metformin to 1000 qAM and 500 mg qPM- advised to try to increase to 1000 mg BID  -Increase Ozempic to 1 mg weekly injections  -Refills sent   -Reviewed signs and symptoms of hypoglycemia and how to treat; Rule of 15  -Reviewed the importance of proper foot care and need to inspect feet daily  -Does have neuropathy; not on gabapentin  -No renal dysfunction; No microalbuminuria ; On ACEI which is renal protective   -Does have hyperlipidemia; not able to tolerate atorvastatin;  Had sent Rx for rosuvastatin 5 mg but he did not start taking it.     -Does have HTN and managed by PCP.  BP 122/84  -Reviewed diabetic diet, importance of exercise and water intake  -Advised to walk 30 mins 5 days a  week and after meals.   -A1c prior to next appt    Follow up in 4 month or sooner if needed     Counseling:    40 minutes spent with patient face to face, greater than 50% of the office visit was dedicated to counseling and coordination of care, reviewing tests & labs, treatment options and current plan, and follow up plans.    - Discussed with patient importance of always bringing glucometer and log book to all visits  - Discussed about heart healthy diabetic diet and how this impacts diabetes management  - Discussed about importance of exercise and walking 30 mins 5 days a week (150 mins/week)  - Discussed importance of medication and diet compliance  - Keep up to date with annual eye exams and annual podiatry visits  - Discussed signs and symptoms of hypoglycemia and hyperglycemia and patient is aware on how to treat them.     Jimmy Picket, FNP  Electronically Signed 05/15/2020

## 2020-05-21 ENCOUNTER — Emergency Department: Payer: 59

## 2020-05-21 ENCOUNTER — Emergency Department
Admission: EM | Admit: 2020-05-21 | Discharge: 2020-05-21 | Disposition: A | Discharge status: Home / Self Care | Payer: 59 | Attending: Physician Assistant | Admitting: Physician Assistant

## 2020-05-21 DIAGNOSIS — M109 Gout, unspecified: Secondary | ICD-10-CM | POA: Insufficient documentation

## 2020-05-21 DIAGNOSIS — M7989 Other specified soft tissue disorders: Secondary | ICD-10-CM | POA: Insufficient documentation

## 2020-05-21 DIAGNOSIS — M25571 Pain in right ankle and joints of right foot: Secondary | ICD-10-CM | POA: Insufficient documentation

## 2020-05-21 LAB — C-REACTIVE PROTEIN: C-Reactive Protein: 6.01 mg/dL — ABNORMAL HIGH (ref 0.02–0.80)

## 2020-05-21 LAB — URIC ACID: Uric acid: 9.1 mg/dL — ABNORMAL HIGH (ref 3.5–7.2)

## 2020-05-21 LAB — CBC AND DIFFERENTIAL
Basophils %: 0.4 % (ref 0.0–3.0)
Basophils Absolute: 0 10*3/uL (ref 0.0–0.3)
Eosinophils %: 0.8 % (ref 0.0–7.0)
Eosinophils Absolute: 0.1 10*3/uL (ref 0.0–0.8)
Hematocrit: 39.8 % (ref 39.0–52.5)
Hemoglobin: 13.4 gm/dL (ref 13.0–17.5)
Lymphocytes Absolute: 2.5 10*3/uL (ref 0.6–5.1)
Lymphocytes: 28.2 % (ref 15.0–46.0)
MCH: 31 pg (ref 28–35)
MCHC: 34 gm/dL (ref 32–36)
MCV: 92 fL (ref 80–100)
MPV: 7.7 fL (ref 6.0–10.0)
Monocytes Absolute: 1.2 10*3/uL (ref 0.1–1.7)
Monocytes: 13 % (ref 3.0–15.0)
Neutrophils %: 57.5 % (ref 42.0–78.0)
Neutrophils Absolute: 5.1 10*3/uL (ref 1.7–8.6)
PLT CT: 307 10*3/uL (ref 130–440)
RBC: 4.34 10*6/uL (ref 4.00–5.70)
RDW: 11.7 % (ref 11.0–14.0)
WBC: 8.9 10*3/uL (ref 4.0–11.0)

## 2020-05-21 LAB — BASIC METABOLIC PANEL
Anion Gap: 16.8 mMol/L (ref 7.0–18.0)
BUN / Creatinine Ratio: 16.8 Ratio (ref 10.0–30.0)
BUN: 19 mg/dL (ref 7–22)
CO2: 21 mMol/L (ref 20–30)
Calcium: 9.9 mg/dL (ref 8.5–10.5)
Chloride: 108 mMol/L (ref 98–110)
Creatinine: 1.13 mg/dL (ref 0.80–1.30)
EGFR: 74 mL/min/{1.73_m2} (ref 60–150)
Glucose: 115 mg/dL — ABNORMAL HIGH (ref 71–99)
Osmolality Calculated: 286 mOsm/kg (ref 275–300)
Potassium: 3.8 mMol/L (ref 3.5–5.3)
Sodium: 142 mMol/L (ref 136–147)

## 2020-05-21 LAB — B-TYPE NATRIURETIC PEPTIDE: B-Natriuretic Peptide: 10.2 pg/mL (ref 0.0–100.0)

## 2020-05-21 LAB — SEDIMENTATION RATE: Sed Rate: 67 mm/hr — ABNORMAL HIGH (ref 0–20)

## 2020-05-21 MED ORDER — HYDROCODONE-ACETAMINOPHEN 5-325 MG PO TABS
ORAL_TABLET | ORAL | Status: AC
Start: 2020-05-21 — End: ?
  Filled 2020-05-21: qty 2

## 2020-05-21 MED ORDER — HYDROCODONE-ACETAMINOPHEN 5-325 MG PO TABS
1.0000 | ORAL_TABLET | Freq: Four times a day (QID) | ORAL | 0 refills | Status: DC | PRN
Start: 2020-05-21 — End: 2020-07-19

## 2020-05-21 MED ORDER — ONDANSETRON HCL 4 MG/2ML IJ SOLN
INTRAMUSCULAR | Status: AC
Start: 2020-05-21 — End: ?
  Filled 2020-05-21: qty 2

## 2020-05-21 MED ORDER — INDOMETHACIN 50 MG PO CAPS
50.0000 mg | ORAL_CAPSULE | Freq: Three times a day (TID) | ORAL | 0 refills | Status: DC | PRN
Start: 2020-05-21 — End: 2020-07-19

## 2020-05-21 MED ORDER — MORPHINE SULFATE 4 MG/ML IJ/IV SOLN (WRAP)
4.0000 mg | Freq: Once | Status: AC
Start: 2020-05-21 — End: 2020-05-21
  Administered 2020-05-21: 20:00:00 4 mg via INTRAVENOUS

## 2020-05-21 MED ORDER — HYDROCODONE-ACETAMINOPHEN 5-325 MG PO TABS
2.0000 | ORAL_TABLET | Freq: Once | ORAL | Status: AC
Start: 2020-05-21 — End: 2020-05-21
  Administered 2020-05-21: 22:00:00 2

## 2020-05-21 MED ORDER — MORPHINE SULFATE 4 MG/ML IJ/IV SOLN (WRAP)
Status: AC
Start: 2020-05-21 — End: ?
  Filled 2020-05-21: qty 1

## 2020-05-21 MED ORDER — ONDANSETRON HCL 4 MG/2ML IJ SOLN
4.0000 mg | Freq: Once | INTRAMUSCULAR | Status: AC
Start: 2020-05-21 — End: 2020-05-21
  Administered 2020-05-21: 20:00:00 4 mg via INTRAVENOUS

## 2020-05-21 NOTE — ED Triage Notes (Signed)
Pt presents to ED for R ankle pain that began a week ago. Pt states the pain has gotten worse and has become intoerable.+swelling, pain, unable to bare weight, LROM. Denies hx gout; Pt does have CHF, diabetes.

## 2020-05-21 NOTE — ED Provider Notes (Signed)
[facility]  EMERGENCY DEPARTMENT  History and Physical Exam       Patient Name: Samuel Koch  Encounter Date:  05/21/2020  Physician Assistant: Shanda Howells, PA-C  Attending Physician: Dow Adolph, MD  PCP: Edmon Crape, MD  Patient DOB:  [birth year]  MRN:  [id number]  Room:  E59/E59-A    History of Presenting Illness     Chief complaint: Ankle Pain    HPI/ROS given by: Patient    Samuel Koch is a 50 y.o. male who presents with 1 week history of progressive right ankle pain in the absence of trauma.  Patient reports that pain initially began approximately 1 week ago to the anterior aspect of the right ankle.  Pain is increased with movement, weightbearing.  He reports that pain has been progressive in nature over the past week and now is associated with swelling.  Pain is rated 10/10 in severity currently.  Patient reports that he was in [place] for the Easter holiday visiting family.  He cut his trip short due to pain, driving straight to ED from [place].     He reports fevers during the week, however, states he was also suffering from GI illness with fevers. Fevers resolved with resolution of diarrhea, nausea, vomiting. Denies congestion, cough, sore throat, shortness of breathing, chest pain, abdominal pain.     Past medical hx significant for nonischemic cardiomyopathy, CHF, diabetes mellitus, renal insufficiency. He denies hx of gout. Denies hx of ankle surgeries.      Review of Systems     Review of Systems   Constitutional: Negative for chills and fever.   HENT: Negative for congestion, ear pain and sore throat.    Eyes: Negative for pain and visual disturbance.   Respiratory: Negative for cough and shortness of breath.    Cardiovascular: Negative for chest pain.   Gastrointestinal: Negative for abdominal pain, diarrhea, nausea and vomiting.   Genitourinary: Negative for difficulty urinating.   Musculoskeletal: Positive for arthralgias and joint swelling. Negative for back pain.    Skin: Negative for rash.   Neurological: Negative for dizziness, weakness and headaches.   Hematological: Does not bruise/bleed easily.   Psychiatric/Behavioral: Negative.    All other systems reviewed and are negative.       Allergies & Medications     Pt has No Known Allergies.    Current/Home Medications    BLOOD GLUCOSE MONITOR, GENERIC, KIT    Use as directed to monitor blood sugar    BLOOD GLUCOSE MONITORING SUPPL (ONETOUCH ULTRA 2) W/DEVICE KIT    USE AS DIRECTED TO MONITOR BLOOD SUGAR    BUMETANIDE (BUMEX) 2 MG TABLET    Take 1 tablets (2 mg total) by mouth every morning AND 2 tablets (4 mg total) Daily after lunch.    CONTINUOUS BLOOD GLUC SENSOR (FREESTYLE LIBRE 2 SENSOR SYSTM) MISC    1 Device by Does not apply route every 14 (fourteen) days    DILTIAZEM (CARDIZEM CD) 180 MG 24 HR CAPSULE    Take 1 capsule (180 mg total) by mouth in the morning.    GLUCOSE BLOOD TEST STRIP    Check BS 5 times daily    INSULIN GLARGINE (LANTUS SOLOSTAR) 100 UNIT/ML INJECTION PEN    36 units twice a day    INSULIN LISPRO, 1 UNIT DIAL, (HUMALOG) 100 UNIT/ML SOLUTION PEN-INJECTOR INJECTION PEN    INJECT 14 UNITS PLUS SLIDING SCALE 2:50>150 THREE TIMES DAILY BEFORE MEALS. Max 75 U daily.  INSULIN PEN NEEDLE (PEN NEEDLES 3/16") 31G X 5 MM MISC    1 each 5 times per day    INSULIN SYRINGES, DISPOSABLE, U-100 1 ML MISC    1 each by Does not apply route 2 (two) times daily    LANCETS ULTRA FINE MISC    Check BS 5 times daily    LISINOPRIL (ZESTRIL) 5 MG TABLET    Take 1 tablet (5 mg total) by mouth daily    METFORMIN (GLUCOPHAGE) 500 MG TABLET    2 tablets (1000 mg) with breakfast and 1 tablets (500 mg) with dinner    POTASSIUM CHLORIDE (K-DUR) 10 MEQ TABLET    Take 1 tablet (10 mEq total) by mouth daily    ROSUVASTATIN (CRESTOR) 5 MG TABLET    Take 1 tablet (5 mg total) by mouth daily    SEMAGLUTIDE, 1 MG/DOSE, (OZEMPIC, 1 MG/DOSE,) 2 MG/1.5ML SOLUTION PEN-INJECTOR    Inject 1 mg into the skin once a week    SPIRONOLACTONE  (ALDACTONE) 25 MG TABLET    Take 1 tablet (25 mg total) by mouth in the morning.        Past Medical History     Pt has a past medical history of Cardiomyopathy, Congestive heart failure, Diabetes mellitus, Hypertension, and PVC's (premature ventricular contractions).     Past Surgical History     Pt  has a past surgical history that includes APPENDECTOMY (OPEN); Cardiac pacemaker placement; Right & Left Heart Cath  w/ Coronary Angios, LV/LA (Right, 03/30/2018); and Right Heart Cath (Right, 06/08/2019).     Family History     The family history includes Diabetes in his brother and paternal grandmother; Heart attack in his sister; Heart disease in his brother, father, and sister; Hypertension in his father; No known problems in his mother.     Social History     Pt reports that he has quit smoking. He has quit using smokeless tobacco. He reports that he does not drink alcohol and does not use drugs.     Physical Exam     Blood pressure (!) 139/97, pulse 99, temperature 99.8 F (37.7 C), temperature source Skin, resp. rate 20, height 1.88 m, SpO2 99 %.    Physical Exam  Constitutional:       General: He is not in acute distress.     Appearance: He is well-developed.   HENT:      Head: Normocephalic and atraumatic.   Eyes:      Conjunctiva/sclera: Conjunctivae normal.      Pupils: Pupils are equal, round, and reactive to light.   Cardiovascular:      Rate and Rhythm: Normal rate and regular rhythm.      Heart sounds: No murmur heard.  Pulmonary:      Effort: Pulmonary effort is normal. No respiratory distress.      Breath sounds: Normal breath sounds.   Musculoskeletal:         General: Normal range of motion.      Cervical back: Normal range of motion.      Right ankle: Swelling present. Tenderness present.      Comments: Tenderness with ROM of right ankle joint and swelling. No overlying erythema or warmth. No ecchymosis. Nontender proximally into right lower leg. PP 2+/4. Distal NV intact.   Skin:     General: Skin is  warm and dry.      Findings: No rash.   Neurological:      Mental Status:  He is alert and oriented to person, place, and time.   Psychiatric:         Behavior: Behavior normal.          Diagnostic Results     The results of the diagnostic studies below have been reviewed by myself:    Labs  Results     Procedure Component Value Units Date/Time    B-type Natriuretic Peptide [[id number]] Collected: 05/21/20 2027    Specimen: Plasma Updated: 05/21/20 2154     B-Natriuretic Peptide 10.2 pg/mL     Sedimentation rate (ESR) [[id number]]  (Abnormal) Collected: 05/21/20 2027    Specimen: Blood Updated: 05/21/20 2119     Sed Rate 67 mm/hr     C Reactive Protein [[id number]]  (Abnormal) Collected: 05/21/20 2027    Specimen: Plasma Updated: 05/21/20 2111     C-Reactive Protein 6.01 mg/dL     Uric acid [[id number]]  (Abnormal) Collected: 05/21/20 2027    Specimen: Plasma Updated: 05/21/20 2109     Uric acid 9.1 mg/dL     Basic Metabolic Panel [[id number]]  (Abnormal) Collected: 05/21/20 2027    Specimen: Plasma Updated: 05/21/20 2109     Sodium 142 mMol/L      Potassium 3.8 mMol/L      Chloride 108 mMol/L      CO2 21 mMol/L      Calcium 9.9 mg/dL      Glucose 244 mg/dL      Creatinine 0.10 mg/dL      BUN 19 mg/dL      Anion Gap 27.2 mMol/L      BUN / Creatinine Ratio 16.8 Ratio      EGFR 74 mL/min/1.33m2      Osmolality Calculated 286 mOsm/kg     CBC and differential [[id number]] Collected: 05/21/20 2027    Specimen: Blood Updated: 05/21/20 2043     WBC 8.9 K/cmm      RBC 4.34 M/cmm      Hemoglobin 13.4 gm/dL      Hematocrit 74.2 %      MCV 92 fL      MCH 31 pg      MCHC 34 gm/dL      RDW 59.5 %      PLT CT 307 K/cmm      MPV 7.7 fL      Neutrophils % 57.5 %      Lymphocytes 28.2 %      Monocytes 13.0 %      Eosinophils % 0.8 %      Basophils % 0.4 %      Neutrophils Absolute 5.1 K/cmm      Lymphocytes Absolute 2.5 K/cmm      Monocytes Absolute 1.2 K/cmm      Eosinophils Absolute 0.1 K/cmm      Basophils Absolute 0.0 K/cmm     Blood  Culture - Venipuncture [[id number]] Collected: 05/21/20 2027    Specimen: Blood from Venipuncture Updated: 05/21/20 2037          Radiologic Studies  XR Ankle Right 3+ Views    Result Date: 05/21/2020  No acute osseous abnormality. Osteoarthritic changes as described above. Soft tissue swelling adjacent to the lateral malleolus. ReadingStation:[email or web address]    US Venous Leg Right    Result Date: 05/21/2020  No evidence of deep vein thrombosis right leg. ReadingStation:[email or web address]      EKG: none     ED Meds     ED Medication Orders (From admission, onward)  None           ED Course and Medical Decision Making     Old medical records and nursing/triage notes were reviewed by myself.    DIAGNOSTIC CONSIDERATIONS    Differential diagnoses include but are not limited to fracture, gout, infection.     CONSULTS  1. Case and plan of care discussed with ED attending, Dr. Joselyn Glassman.     ED COURSE & MDM  10:16 PM Pain improved following treatment in the emergency department.  Uric acid elevated as well as inflammatory markers.  White blood cell count normal. Radiographs as well as ultrasound negative.  I discussed findings with patient. Physical examination as well as HPI were consistent with gout.  I will treat as such with indomethacin, analgesics.  He was provided information regarding a gout diet.  To return to the emergency department if any development of fever, chills, redness, increasing sx. He has voiced understanding. In agreement with plan. Appreciative of care.      In addition to the above history, please see nursing notes. Allergies, meds, past medical, family, social hx, and the results of the diagnostic studies performed have been reviewed by myself.    This chart was generated by an EMR and may contain errors or omissions not intended by the user.     Procedures / Critical Care     None     Diagnosis / Disposition     Clinical Impression  1. Acute gouty arthritis        Disposition  ED Disposition     ED Disposition    Discharge    Condition   --    Date/Time   Sun May 21, 2020 10:10 PM    Comment   Samuel Koch discharge to home/self care.    Condition at disposition: Stable               Follow up for Discharged Patients  Edmon Crape, MD  [address]  [place] [address]  [phone]    Follow up  As needed      Prescriptions for Discharged Patients  New Prescriptions    HYDROCODONE-ACETAMINOPHEN (NORCO) 5-325 MG PER TABLET    Take 1 tablet by mouth every 6 (six) hours as needed for Pain    INDOMETHACIN (INDOCIN) 50 MG CAPSULE    Take 1 capsule (50 mg total) by mouth 3 (three) times daily as needed (pain)             Ruffin Pyo, PA  05/21/20 2219       Dow Adolph, MD  05/22/20 1713

## 2020-05-21 NOTE — ED Notes (Signed)
This nurse reviewed the discharge instructions with the pt. Pt was instructed to follow up with PCP. 1 rxs or work note included. IV was removed and final set of VS obtained. Pt verbalized understanding. At this time pt has no questions or concerns. Pt wheelchaired off unit.

## 2020-05-23 ENCOUNTER — Encounter: Payer: 59 | Admitting: Physician Assistant

## 2020-05-25 ENCOUNTER — Telehealth (INDEPENDENT_AMBULATORY_CARE_PROVIDER_SITE_OTHER): Payer: Self-pay

## 2020-05-25 NOTE — Telephone Encounter (Signed)
Called patient to review that there are no benefits for bariatric surgery under medical policy.  Offered to review self-pay packages, Scientist, clinical (histocompatibility and immunogenetics) and medical weight management options.  Patient not able to self pay for surgery and didn't want to schedule self pay medical at this time.  Will call back if interested.

## 2020-05-26 LAB — VH CULTURE-BLOOD-VENIPUNCTURE: Culture Result: NO GROWTH

## 2020-05-29 NOTE — Progress Notes (Unsigned)
Patient left message to cancel appointment for 5/2. Appointment cancelled and called to reschedule. Left message.

## 2020-06-05 ENCOUNTER — Ambulatory Visit: Payer: Self-pay | Admitting: Family Nurse Practitioner

## 2020-06-07 ENCOUNTER — Telehealth: Payer: Self-pay

## 2020-06-07 NOTE — Telephone Encounter (Signed)
Requested records from pcp. Updated ce. Upcoming appt on 06/14/20

## 2020-06-12 NOTE — Telephone Encounter (Signed)
2nd request

## 2020-06-14 ENCOUNTER — Encounter: Payer: 59 | Admitting: Physician Assistant

## 2020-07-13 NOTE — Telephone Encounter (Signed)
No pcp. Upcoming appt on 07/19/20

## 2020-07-19 ENCOUNTER — Other Ambulatory Visit: Payer: Self-pay | Admitting: Internal Medicine

## 2020-07-19 ENCOUNTER — Ambulatory Visit: Payer: 59 | Admitting: Physician Assistant

## 2020-07-19 ENCOUNTER — Ambulatory Visit: Payer: 59

## 2020-07-19 ENCOUNTER — Encounter: Payer: Self-pay | Admitting: Physician Assistant

## 2020-07-19 VITALS — BP 122/82 | HR 87 | Ht 74.0 in | Wt 335.0 lb

## 2020-07-19 DIAGNOSIS — I495 Sick sinus syndrome: Secondary | ICD-10-CM

## 2020-07-19 DIAGNOSIS — I5032 Chronic diastolic (congestive) heart failure: Secondary | ICD-10-CM

## 2020-07-19 DIAGNOSIS — R002 Palpitations: Secondary | ICD-10-CM

## 2020-07-19 DIAGNOSIS — I493 Ventricular premature depolarization: Secondary | ICD-10-CM

## 2020-07-19 DIAGNOSIS — I10 Essential (primary) hypertension: Secondary | ICD-10-CM

## 2020-07-19 DIAGNOSIS — Z950 Presence of cardiac pacemaker: Secondary | ICD-10-CM

## 2020-07-19 DIAGNOSIS — I5042 Chronic combined systolic (congestive) and diastolic (congestive) heart failure: Secondary | ICD-10-CM

## 2020-07-19 NOTE — Progress Notes (Deleted)
Cardiology Follow-Up      Patient Name: Samuel Koch   Date of Birth: [birth year]    Provider: Magdalen Spatz, PA     Patient Care Team:  Edmon Crape, MD as PCP - General (Family Medicine)  Abel Presto, DO as Consulting Physician (Cardiology)  Elizbeth Squires, NP as Nurse Practitioner (Family Nurse Practitioner)  Joneen Roach, PA as Physician Assistant (Physician Assistant)    Chief Complaint: NICM (6 Month follow up), Palpitations, Shortness of Breath, Fatigue, and Chronic heart failure      Impression      1. Essential hypertension    2. Palpitation    3. Chronic combined systolic and diastolic HF (heart failure)    4. Chronic diastolic HF (heart failure)      Plan        1. PVCs: s/p PVC ablation in 2017. EF 50-55% by echo (08/2019). He is more symptomatic over the past month with palpitations and fatigue, however, device interrogation actually shows a slight decrease in hourly PVCs compared to prior. He does have runs of SVT. He wore a 48 hour monitor recently, but we do not have these results yet. He is intolerant to beta blockers. Will try increasing diltiazem to 180 mg to see if this improves his sx.     2. Sinus node dysfunction: s/p dual chamber pacemaker (2017). Has not had any remote or office interrogations since march, so we had his device checked today. He is 35% atrial paced. Normal device function.      3. Nonischemic cardiomyopathy: EF 50-55%, previously 30% in 2017. NYHA class 3. ACC/AHA stage C. Followed by HF clinic. On GDMT.     4. Type II diabetes: followed by primary care     5. Morbid obesity     6. Fatigue: He feels exhausted all the time and this has worsened over the past month. Had normal TSH, CBC, and lyme titer earlier this year. He was tested for sleep apnea in the distant past in [place], [place]. This was reportedly negative. May be worth repeating the sleep study if holter monitor does not reveal any obvious causes.     7. Chest pain: seems atypical. He had a false  positive stress test last year and normal coronaries by [facility] in 03/2018, so do not think repeating a stress test would be helpful. Continue risk factor modification. Will continue to follow.          History of Present Illness   Samuel Koch is a 50 y.o. male being seen today for follow-up.      He has a history hypertension, chronic combined systolic and diastolic CHF, NICM, sinus node dysfunction, type II diabetes, PVCs, and NSVT. S/p dual chamber pacemaker implant in 2017. S/p PVC ablation in 2017.      His most recent echo from 08/13/19 showed EF 50-55%, grade I diastolic dysfunction, RV mildly dilated with normal systolic function, and no significant valvular disease. His EF was previously 30% in 2017. He continues to follow with the HF clinic.     He was seen by Samuel Koch in May, at which time he complained of palpitations, shortness of breath, and weakness. A 48-hour holter monitor was ordered at that time. He did not turn this in until a week or two ago, so we do not have these results. We did check his device today though, and this demonstrated episodes of SVT lasting several minutes, reduced hourly PVCs, A paced 35%,  and normal device function.     He reports worsening fatigue over the past month. He also has shortness of breath, DOE, orthopnea, dizziness when sitting, palpitations, and chest pain. He describes chest pain as sharp. It shoots down his left arm and is typically brief. This occurs at rest and takes his breath away. He also describes a different kind of chest heaviness. This can last up to 20 minutes. This also occurs primarily at rest.       He was tested for sleep apnea in the distant past in [place], [place]. We do not have these results, but apparently this was normal. The wife denies witnessed apneic episodes, but he does have persistent daytime fatigue.       Past Medical History     PMH - 06/23/2020 - CC/DG  LOV - 12/03/2019 - CC  LABS - 05/21/2020 - In Epic   LAST EKG - 01/12/2020    ED -  05/21/2020 - Acute gout    1. Chronic combined systolic and diastolic CHF   A. RHC 06/08/19- wedge pressure 16, RB pressure 33/7. Pulmonary artery pressure 30/12    2. Nonischemic cardiomyopathy   A. LHC (6/17): 30% mid RCA, no other significant disease.   B. Echo (6/17): EF 30-35%.   C. Holter (7/17): 19% PVCs.   D. Echo (3/18): EF 50-55%, moderate LVH, grade II diastolic dysfunction.   E. Echo (9/18): EF 50-55%.   F. Echo 10/07/17- LV cavity size is increased. The LVEF is est. at 50%. Mild hypokinesis of the distal septum and apex which may be due to paced rhythm. Pacer wire present in RV.   G. Echo 05/22/18- EF 50-55%. Grade II DD. Mitral valve leaflets mildly thickened without stenosis. Trace MR. Trace TR. Estimated pulmonary arterial pressure is 25-30 mmHg.   H. Echo 06/12/2018- Mild concentric LVH, EF 50-55%, aortic root is mildy dilated, aortic root is 3.9cm, tricuspid regurgitation is mild, RSVP .   I. Echo 08/13/19- EF 50-55%. Grade I diastolic dysfunction of the left ventricle (impaired relaxation pattern). The left ventricular ejection fraction is low normal, estimated at 50- 55%. RV mildly dilated in size with normal systolic function. A pacer wire is present in RV. No significant valvular heart disease or evidence for pulmonary hypertension.     3. Acute Coronary Syndrome   A. Echo 03/27/2018- Technically difficult study with limited endocardial definition and structural resolution in the apical views, Intravenous contrast was administered to enhance endocardial definition, LV cavity size is norma, mild concentric left ventricular hypertrophy, LV systolic function is at the lower limits of normal, EF 50%, no regional wall motion abnormalities present, LV diastolic filling pattern is probably pseudonormalized, consistent with grade 2 left ventricular diastolic dysfunction and elevated left atrial pressure, Right-sided chambers are not optimally visualized but appear grossly normal in size, Pacemaker lead is  visualized within the right heart chamber, no significant valvular heart disease.   B. Stress 03/28/2018- Completed vasodilator protocol., No ischemic changes with infusion, No arrhythmias, Overall, low risk vasodilator stress rest SPECT perfusion study, a large moderate to severe perfusion defect involving the apex, inferior stripe, mid and basal inferoseptal and inferolateral segments with mild reversibility suggestive of possible ischemia but with likely strong component of diaphragmatic attenuation artifact, Also anteroseptal perfusion defect from attenuation artifact, No significant findings of ischemia Noted, Dilated LV with mildly decreased left ventricular function, stress EF 44%.   C. THC 03/30/2018- Negative heart cath. False positive stress test.     4.  Bradycardia/ SSS   A. 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 [facility])     5. PVCs   A. 48 hour holter (7/17) with 19% PVCs.   B. PVC ablation in 8/17 but PVCs recurred.   C. Holter (11/17) with rare PVCs, < 1%.   D. Holter (9/18) with rare PVCs only. 6. NSVT   7. Type II diabetes   8. Hypertriglyceridemia    Allergies     has No Known Allergies.    Medications       Current Outpatient Medications:     Blood Glucose Monitor, generic, Kit, Use as directed to monitor blood sugar, Disp: 1 kit, Rfl: 1    Blood Glucose Monitoring Suppl (OneTouch Ultra 2) w/Device Kit, USE AS DIRECTED TO MONITOR BLOOD SUGAR, Disp: , Rfl:     bumetanide (BUMEX) 2 MG tablet, Take 1 tablets (2 mg total) by mouth every morning AND 2 tablets (4 mg total) Daily after lunch., Disp: 270 tablet, Rfl: 3    Continuous Blood Gluc Sensor (FreeStyle Libre 2 Sensor Systm) Misc, 1 Device by Does not apply route every 14 (fourteen) days, Disp: 2 each, Rfl: 12    glucose blood test strip, Check BS 5 times daily, Disp: 150 each, Rfl: 11    insulin glargine (LANTUS SOLOSTAR) 100 UNIT/ML injection pen, 36 units twice a day, Disp: 75 mL, Rfl: 3    insulin lispro, 1 Unit  Dial, (HumaLOG) 100 UNIT/ML Solution Pen-injector injection pen, INJECT 14 UNITS PLUS SLIDING SCALE 2:50>150 THREE TIMES DAILY BEFORE MEALS. Max 75 U daily., Disp: 75 mL, Rfl: 3    Insulin Pen Needle (Pen Needles 3/16") 31G X 5 MM Misc, 1 each 5 times per day, Disp: 450 each, Rfl: 3    Insulin Syringes, Disposable, U-100 1 ML Misc, 1 each by Does not apply route 2 (two) times daily, Disp: 180 each, Rfl: 0    Lancets Ultra Fine Misc, Check BS 5 times daily, Disp: 150 each, Rfl: 11    lisinopril (ZESTRIL) 5 MG tablet, Take 1 tablet (5 mg total) by mouth daily, Disp: 90 tablet, Rfl: 3    metFORMIN (GLUCOPHAGE) 500 MG tablet, 2 tablets (1000 mg) with breakfast and 1 tablets (500 mg) with dinner (Patient taking differently: 2 tablets (1000 mg) with breakfast and 1 tablets (500 mg) with dinner 1500 total), Disp: 90 tablet, Rfl: 0    potassium chloride (K-DUR) 10 MEQ tablet, Take 1 tablet (10 mEq total) by mouth daily, Disp: 90 tablet, Rfl: 3    Semaglutide, 1 MG/DOSE, (Ozempic, 1 MG/DOSE,) 2 MG/1.5ML Solution Pen-injector, Inject 1 mg into the skin once a week, Disp: 9 mL, Rfl: 3    spironolactone (ALDACTONE) 25 MG tablet, Take 1 tablet (25 mg total) by mouth in the morning., Disp: 90 tablet, Rfl: 3    Review of Systems     Comprehensive review of systems performed by me. Unless otherwise noted, all systems are negative.      Physical Exam     Visit Vitals  BP 122/82 (BP Site: Left arm, Patient Position: Sitting)   Pulse 87   Ht 1.88 m (6\' 2" )   Wt 152 kg (335 lb)   BMI 43.01 kg/m     Vitals:    07/19/20 1500   BP: 122/82   BP Site: Left arm   Patient Position: Sitting   Pulse: 87   Weight: 152 kg (335 lb)   Height: 1.88 m (6\' 2" )  Wt Readings from Last 3 Encounters:   07/19/20 152 kg (335 lb)   05/15/20 152 kg (335 lb)   03/08/20 151 kg (333 lb)        Constitutional -  Well appearing, and in no distress  Respiratory - Clear to auscultation bilaterally, normal respiratory effort    Cardiovascular system -    Regular  rate and rhythm    Normal S1, S2    No murmurs, rubs, gallops   Jugular venous pulse is normal        Carotid upstroke normal, no carotid bruits auscultated   2+ pulses in the posterior tibial / dorsalis pedis bilaterally    Neurological - Alert, oriented, no focal neurological deficits  Extremities -  No clubbing or cyanosis. No peripheral edema    Labs     Lab Results   Component Value Date/Time    WBC 8.9 05/21/2020 08:27 PM    RBC 4.34 05/21/2020 08:27 PM    HGB 13.4 05/21/2020 08:27 PM    HCT 39.8 05/21/2020 08:27 PM    PLT 307 05/21/2020 08:27 PM    TSH 0.98 03/08/2020 10:59 AM       Lab Results   Component Value Date/Time    NA 142 05/21/2020 08:27 PM    K 3.8 05/21/2020 08:27 PM    CL 108 05/21/2020 08:27 PM    CO2 21 05/21/2020 08:27 PM    GLU 115 (H) 05/21/2020 08:27 PM    BUN 19 05/21/2020 08:27 PM    CREAT 1.13 05/21/2020 08:27 PM    PROT 7.1 12/26/2018 06:20 PM    ALKPHOS 67 12/26/2018 06:20 PM    AST 15 12/26/2018 06:20 PM    ALT 24 12/26/2018 06:20 PM       Lab Results   Component Value Date/Time    CHOL 206 (H) 05/01/2020 08:17 AM    TRIG 574 (HH) 05/01/2020 08:17 AM    TRIG 228 (H) 03/26/2018 04:24 PM    HDL 36 (L) 05/01/2020 08:17 AM    LDL 79 05/01/2020 08:17 AM       Lab Results   Component Value Date/Time    HGBA1CPERCNT 8.2 03/26/2018 04:24 PM       Cardiogenics:     EKG:           Electronically signed by:   Magdalen Spatz, PA  07/19/2020

## 2020-07-20 NOTE — Progress Notes (Addendum)
Cardiology Follow-Up      Patient Name: Samuel Koch   Date of Birth: Jul 11, 1970    Provider: Magdalen Spatz, PA     Patient Care Team:  Edmon Crape, MD as PCP - General (Family Medicine)  Abel Presto, DO as Consulting Physician (Cardiology)  Elizbeth Squires, NP as Nurse Practitioner (Family Nurse Practitioner)  Joneen Roach, PA as Physician Assistant (Physician Assistant)    Chief Complaint: NICM (6 Month follow up), Palpitations, Shortness of Breath, Fatigue, and Chronic heart failure      Impression      1. Chronic combined systolic and diastolic HF (heart failure)    2. PVC's (premature ventricular contractions)    3. SSS (sick sinus syndrome)    4. Presence of permanent cardiac pacemaker      Plan     1. PVCs: s/p PVC ablation in 2017. EF 50-55% by echo (08/2019). Tolerating diltiazem. PVC burden less than 1% by last 48 hour holter.     2. Sinus node dysfunction: s/p dual chamber pacemaker (2017). He is 33% atrial paced. Normal device function.      3. Nonischemic cardiomyopathy/combined systolic and diastolic HF: EF 16-10%, previously 30% in 2017. NYHA class 3. ACC/AHA stage C. Followed by HF clinic. On GDMT.     4. Type II diabetes: followed by primary care     5. Morbid obesity     6. Fatigue: He was tested for sleep apnea in the distant past in [place], [place]. This was reported to be negative.      7. Chest pain: seems atypical. He had a false positive stress test last year and normal coronaries by [facility] in 03/2018. No progressive symptoms, episodes sporadic. Continue to monitor.      Continue present management. Will plan to update Dr. [name] regarding ongoing symptom concerns, and contact patient with any further recommendations. He should continue to follow heart failure clinic, and may benefit from repeat sleep study.     ADDENDUM:  Discussed with Dr. [name], no further treatment changes. Recommended to continue routine visits with Dr. [name] and heart failure clinic. May need to  see neurology - discuss with primary care.    History of Present Illness   Samuel Koch is a 50 y.o. male being seen today for follow-up.      He has a history hypertension, chronic combined systolic and diastolic CHF, NICM, sinus node dysfunction, type II diabetes, PVCs, and NSVT. S/p dual chamber pacemaker implant in 2017. S/p PVC ablation in 2017.      His most recent echo from 08/13/19 showed EF 50-55%, grade I diastolic dysfunction, RV mildly dilated with normal systolic function, and no significant valvular disease. His EF was previously 30% in 2017. He continues to follow with the HF clinic.    Following last visit with Dr. [name], holter monitor was ordered to reassess PVC burden. Holter 11/15/19 to 11/16/19 PVC burden 0.09%.     He has complaints of dizziness, mostly positional also can feel at rest at times. He has previous syncope. He had an episode recently when he felt dizzy, developed shaking of his hands and couldn't find his words. Lasted less than one month. He complains of quick weight gain 321 to 328lb in the past week. No dietary changes, does not watch salt intake. Feels fatigued. Short of breath, especially with stairs and winded after about 50-100 yards. He has sporadic left sided chest pressure.      He  was tested for sleep apnea in the distant past in [place], [place]. We do not have these results but it normal in 2016.     Device interrogation today:  4 SVT-ST events recorded; longest episode lasting up to approx 39secs in duration on 05/21/2020.  4 brief nsVT events recorded; <5secs in durations.  No AHR episodes recorded.  Leads are stable. HR histograms are OK. AP: 33.8%. VP: <0.1%. Battery life: 5.5 years.    Past Medical History     PMH - 06/23/2020 - CC/DG  LOV - 12/03/2019 - CC  LABS - 05/21/2020 - In Epic   LAST EKG - 01/12/2020    ED - 05/21/2020 - Acute gout    1. Chronic combined systolic and diastolic CHF   A. RHC 06/08/19- wedge pressure 16, RB pressure 33/7. Pulmonary artery  pressure 30/12    2. Nonischemic cardiomyopathy   A. LHC (6/17): 30% mid RCA, no other significant disease.   B. Echo (6/17): EF 30-35%.   C. Holter (7/17): 19% PVCs.   D. Echo (3/18): EF 50-55%, moderate LVH, grade II diastolic dysfunction.   E. Echo (9/18): EF 50-55%.   F. Echo 10/07/17- LV cavity size is increased. The LVEF is est. at 50%. Mild hypokinesis of the distal septum and apex which may be due to paced rhythm. Pacer wire present in RV.   G. Echo 05/22/18- EF 50-55%. Grade II DD. Mitral valve leaflets mildly thickened without stenosis. Trace MR. Trace TR. Estimated pulmonary arterial pressure is 25-30 mmHg.   H. Echo 06/12/2018- Mild concentric LVH, EF 50-55%, aortic root is mildy dilated, aortic root is 3.9cm, tricuspid regurgitation is mild, RSVP .   I. Echo 08/13/19- EF 50-55%. Grade I diastolic dysfunction of the left ventricle (impaired relaxation pattern). The left ventricular ejection fraction is low normal, estimated at 50- 55%. RV mildly dilated in size with normal systolic function. A pacer wire is present in RV. No significant valvular heart disease or evidence for pulmonary hypertension.     3. Acute Coronary Syndrome   A. Echo 03/27/2018- Technically difficult study with limited endocardial definition and structural resolution in the apical views, Intravenous contrast was administered to enhance endocardial definition, LV cavity size is norma, mild concentric left ventricular hypertrophy, LV systolic function is at the lower limits of normal, EF 50%, no regional wall motion abnormalities present, LV diastolic filling pattern is probably pseudonormalized, consistent with grade 2 left ventricular diastolic dysfunction and elevated left atrial pressure, Right-sided chambers are not optimally visualized but appear grossly normal in size, Pacemaker lead is visualized within the right heart chamber, no significant valvular heart disease.   B. Stress 03/28/2018- Completed vasodilator protocol., No  ischemic changes with infusion, No arrhythmias, Overall, low risk vasodilator stress rest SPECT perfusion study, a large moderate to severe perfusion defect involving the apex, inferior stripe, mid and basal inferoseptal and inferolateral segments with mild reversibility suggestive of possible ischemia but with likely strong component of diaphragmatic attenuation artifact, Also anteroseptal perfusion defect from attenuation artifact, No significant findings of ischemia Noted, Dilated LV with mildly decreased left ventricular function, stress EF 44%.   C. THC 03/30/2018- Negative heart cath. False positive stress test.     4. Bradycardia/ SSS   A. 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 [facility])     5. PVCs   A. 48 hour holter (7/17) with 19% PVCs.   B. PVC ablation in 8/17 but PVCs recurred.   C. Holter (11/17) with  rare PVCs, < 1%.   D. Holter (9/18) with rare PVCs only. 6. NSVT   7. Type II diabetes   8. Hypertriglyceridemia    Allergies     has No Known Allergies.    Medications       Current Outpatient Medications:     Blood Glucose Monitor, generic, Kit, Use as directed to monitor blood sugar, Disp: 1 kit, Rfl: 1    Blood Glucose Monitoring Suppl (OneTouch Ultra 2) w/Device Kit, USE AS DIRECTED TO MONITOR BLOOD SUGAR, Disp: , Rfl:     bumetanide (BUMEX) 2 MG tablet, Take 1 tablets (2 mg total) by mouth every morning AND 2 tablets (4 mg total) Daily after lunch., Disp: 270 tablet, Rfl: 3    Continuous Blood Gluc Sensor (FreeStyle Libre 2 Sensor Systm) Misc, 1 Device by Does not apply route every 14 (fourteen) days, Disp: 2 each, Rfl: 12    glucose blood test strip, Check BS 5 times daily, Disp: 150 each, Rfl: 11    insulin glargine (LANTUS SOLOSTAR) 100 UNIT/ML injection pen, 36 units twice a day, Disp: 75 mL, Rfl: 3    insulin lispro, 1 Unit Dial, (HumaLOG) 100 UNIT/ML Solution Pen-injector injection pen, INJECT 14 UNITS PLUS SLIDING SCALE 2:50>150 THREE TIMES DAILY BEFORE MEALS.  Max 75 U daily., Disp: 75 mL, Rfl: 3    Insulin Pen Needle (Pen Needles 3/16") 31G X 5 MM Misc, 1 each 5 times per day, Disp: 450 each, Rfl: 3    Insulin Syringes, Disposable, U-100 1 ML Misc, 1 each by Does not apply route 2 (two) times daily, Disp: 180 each, Rfl: 0    Lancets Ultra Fine Misc, Check BS 5 times daily, Disp: 150 each, Rfl: 11    lisinopril (ZESTRIL) 5 MG tablet, Take 1 tablet (5 mg total) by mouth daily, Disp: 90 tablet, Rfl: 3    metFORMIN (GLUCOPHAGE) 500 MG tablet, 2 tablets (1000 mg) with breakfast and 1 tablets (500 mg) with dinner (Patient taking differently: 2 tablets (1000 mg) with breakfast and 1 tablets (500 mg) with dinner 1500 total), Disp: 90 tablet, Rfl: 0    potassium chloride (K-DUR) 10 MEQ tablet, Take 1 tablet (10 mEq total) by mouth daily, Disp: 90 tablet, Rfl: 3    Semaglutide, 1 MG/DOSE, (Ozempic, 1 MG/DOSE,) 2 MG/1.5ML Solution Pen-injector, Inject 1 mg into the skin once a week, Disp: 9 mL, Rfl: 3    spironolactone (ALDACTONE) 25 MG tablet, Take 1 tablet (25 mg total) by mouth in the morning., Disp: 90 tablet, Rfl: 3    Review of Systems     Comprehensive review of systems performed by me. Unless otherwise noted, all systems are negative.      Physical Exam     Visit Vitals  BP 122/82 (BP Site: Left arm, Patient Position: Sitting)   Pulse 87   Ht 1.88 m (6\' 2" )   Wt 152 kg (335 lb)   BMI 43.01 kg/m     Vitals:    07/19/20 1500   BP: 122/82   BP Site: Left arm   Patient Position: Sitting   Pulse: 87   Weight: 152 kg (335 lb)   Height: 1.88 m (6\' 2" )     Wt Readings from Last 3 Encounters:   07/19/20 152 kg (335 lb)   05/15/20 152 kg (335 lb)   03/08/20 151 kg (333 lb)        Constitutional -  Well appearing, obese and in no distress  Respiratory - Clear to auscultation bilaterally, normal respiratory effort    Cardiovascular system -    Regular rate and rhythm    Normal S1, S2    No murmurs, rubs, gallops   Jugular venous pulse is normal        Carotid upstroke normal, no  carotid bruits auscultated   2+ pulses in the posterior tibial / dorsalis pedis bilaterally    Neurological - Alert, oriented, no focal neurological deficits  Extremities -  No clubbing or cyanosis. Trace peripheral edema    Labs     Lab Results   Component Value Date/Time    WBC 8.9 05/21/2020 08:27 PM    RBC 4.34 05/21/2020 08:27 PM    HGB 13.4 05/21/2020 08:27 PM    HCT 39.8 05/21/2020 08:27 PM    PLT 307 05/21/2020 08:27 PM    TSH 0.98 03/08/2020 10:59 AM       Lab Results   Component Value Date/Time    NA 142 05/21/2020 08:27 PM    K 3.8 05/21/2020 08:27 PM    CL 108 05/21/2020 08:27 PM    CO2 21 05/21/2020 08:27 PM    GLU 115 (H) 05/21/2020 08:27 PM    BUN 19 05/21/2020 08:27 PM    CREAT 1.13 05/21/2020 08:27 PM    PROT 7.1 12/26/2018 06:20 PM    ALKPHOS 67 12/26/2018 06:20 PM    AST 15 12/26/2018 06:20 PM    ALT 24 12/26/2018 06:20 PM       Lab Results   Component Value Date/Time    CHOL 206 (H) 05/01/2020 08:17 AM    TRIG 574 (HH) 05/01/2020 08:17 AM    TRIG 228 (H) 03/26/2018 04:24 PM    HDL 36 (L) 05/01/2020 08:17 AM    LDL 79 05/01/2020 08:17 AM       Lab Results   Component Value Date/Time    HGBA1CPERCNT 8.2 03/26/2018 04:24 PM       Cardiogenics:     EKG: Sinus rhythm, low voltage, Q           Electronically signed by:   Magdalen Spatz, PA  07/20/2020

## 2020-07-26 ENCOUNTER — Encounter: Payer: Self-pay | Admitting: Family Nurse Practitioner

## 2020-07-26 ENCOUNTER — Ambulatory Visit
Admission: RE | Admit: 2020-07-26 | Discharge: 2020-07-26 | Disposition: A | Discharge status: Home / Self Care | Payer: 59 | Source: Ambulatory Visit | Attending: Family Nurse Practitioner | Admitting: Family Nurse Practitioner

## 2020-07-26 VITALS — BP 131/86 | HR 87 | Wt 337.0 lb

## 2020-07-26 DIAGNOSIS — I5042 Chronic combined systolic (congestive) and diastolic (congestive) heart failure: Secondary | ICD-10-CM

## 2020-07-26 DIAGNOSIS — I495 Sick sinus syndrome: Secondary | ICD-10-CM

## 2020-07-26 DIAGNOSIS — I493 Ventricular premature depolarization: Secondary | ICD-10-CM

## 2020-07-26 DIAGNOSIS — N289 Disorder of kidney and ureter, unspecified: Secondary | ICD-10-CM | POA: Insufficient documentation

## 2020-07-26 DIAGNOSIS — I5033 Acute on chronic diastolic (congestive) heart failure: Secondary | ICD-10-CM | POA: Insufficient documentation

## 2020-07-26 DIAGNOSIS — I5032 Chronic diastolic (congestive) heart failure: Secondary | ICD-10-CM

## 2020-07-26 DIAGNOSIS — I10 Essential (primary) hypertension: Secondary | ICD-10-CM

## 2020-07-26 DIAGNOSIS — I110 Hypertensive heart disease with heart failure: Secondary | ICD-10-CM | POA: Insufficient documentation

## 2020-07-26 DIAGNOSIS — Z6841 Body Mass Index (BMI) 40.0 and over, adult: Secondary | ICD-10-CM | POA: Insufficient documentation

## 2020-07-26 DIAGNOSIS — I428 Other cardiomyopathies: Secondary | ICD-10-CM

## 2020-07-26 DIAGNOSIS — I255 Ischemic cardiomyopathy: Secondary | ICD-10-CM | POA: Insufficient documentation

## 2020-07-26 LAB — I-STAT CHEM 8 CARTRIDGE
Anion Gap I-Stat: 17 — ABNORMAL HIGH (ref 7.0–16.0)
BUN I-Stat: 20 mg/dL (ref 7–22)
Calcium Ionized I-Stat: 4.8 mg/dL (ref 4.35–5.10)
Chloride I-Stat: 100 mMol/L (ref 98–110)
Creatinine I-Stat: 1.3 mg/dL (ref 0.80–1.30)
EGFR: 66 mL/min/{1.73_m2} (ref 60–150)
Glucose I-Stat: 217 mg/dL — ABNORMAL HIGH (ref 71–99)
Hematocrit I-Stat: 41 % (ref 39.0–52.5)
Hemoglobin I-Stat: 13.9 gm/dL (ref 13.0–17.5)
Potassium I-Stat: 4.2 mMol/L (ref 3.5–5.3)
Sodium I-Stat: 140 mMol/L (ref 136–147)
TCO2 I-Stat: 28 mMol/L (ref 24–29)

## 2020-07-26 MED ORDER — FUROSEMIDE 10 MG/ML IJ SOLN
160.0000 mg | Freq: Once | INTRAMUSCULAR | Status: AC
Start: 2020-07-26 — End: 2020-07-26
  Administered 2020-07-26: 15:00:00 160 mg via INTRAVENOUS
  Filled 2020-07-26: qty 16

## 2020-07-26 NOTE — Patient Instructions (Signed)
Begin to track your dizziness and check your blood sugars    Change lisinopril 5 mg to bedtime    If you have increased swelling, please take bumex 2 mg 2 tablets in am and 2 tablets in pm until weight declines - about 2-3 day should be enough.

## 2020-07-26 NOTE — Progress Notes (Signed)
[facility]      follow-up Visit    Patient Name: Samuel Koch     Date of Birth: [birth year]    Provider: Elizbeth Squires, NP     Primary Cardiologist: Lovey Newcomer. Alexander, DO    Chief Complaint: Urgent add-on 5 pound weight gain overnight    Subjective     HPI:  I had the pleasure of seeing Samuel Koch today at [facility] for an advanced heart failure visit.  Samuel Koch is a 50 y.o. male with non-ischemic cardiomyopathy,  EF 50-55% previous 30% (2017), NYHA Class 3, ACC/AHA Stage C.  Samuel Koch also  has a past medical history of Cardiomyopathy, Congestive heart failure, Diabetes mellitus, Hypertension, and PVC's (premature ventricular contractions).      From a cardiovascular standpoint, Samuel Koch states has not done well.      Samuel Koch he is here today for an urgent add-on appointment due to weight gain of 5 pounds overnight.  In a 24-hour recall of his diet he admits to eating mashed potatoes green beans and ham shepherds pie and a bowl of chili from Samuel Koch.      He reports that he has been compliant with his medications.  He did not try an extra dose of Bumex at home.  He called the clinic.  He notes that he has tried to take 3 tablets of Bumex in the afternoon and has increased cramping.  Has not tried 2 tablets in the morning and 2 tablets in the afternoon.  His maintenance dose is 1 tablet of 2 mg of Bumex in the morning and 2 tablets of 2 mg Bumex in the afternoon.      He does continue to drink in excess of 5 L of fluid per day he admits to episodes of dizziness.  He notes the dizziness can occur with position changes and also will occur when he moves his head or has coughing.  He does not check his blood sugars at home to evaluate if the dizziness is associated with low blood sugars.    Was recently seen by cardiology.  Device check was stable.  Was in the emergency room on April 17 due to ankle pain.  Is followed by  endocrinology for diabetes    He has had no hospital admissions or emergency room visits.    Samuel Koch reports that he has not been exercising.  He does check blood pressures at home.  He has been checking morning weights; his weight has not been stable.  Samuel Koch has been following a fluid restriction of 2000 mL per day and has restricting dietary sodium content to no more than 2 gm/day.  He reports that he has been compliant with his medications.        Review of Systems   Constitutional:  Positive for malaise/fatigue. Negative for chills and weight loss.   Respiratory:  Positive for shortness of breath.    Cardiovascular:  Positive for orthopnea. Negative for chest pain, palpitations and leg swelling.   Gastrointestinal:  Negative for diarrhea, nausea and vomiting.   Musculoskeletal:  Negative for myalgias.   Neurological:  Positive for dizziness.   All other systems reviewed and are negative.   Comprehensive 12 point review of system is negative unless described above in HPI    Review of Cardiovascular Testing:  1. Chronic combined systolic and diastolic CHF     2. Nonischemic cardiomyopathy   A.  LHC (6/17): 30% mid RCA, no other significant disease.   B. Echo (6/17): EF 30-35%.   C. Holter (7/17): 19% PVCs.   D. Echo (3/18): EF 50-55%, moderate LVH, grade II diastolic dysfunction.   E. Echo (9/18): EF 50-55%   F. Echo 10/07/17- LV cavity size is increased. The LVEF is est. at 50%. Mild hypokinesis of the distal septum and apex which may be due to paced rhythm. Pacer wire present in RV.   G. Echo 06/12/2018- Mild concentric LVH, EF 50-55%, aortic root is mildy dilated, aortic root is 3.9cm, tricuspid regurgitation is mild, RSVP .   H. RHC 06/08/2019 co 5.9/ ci 2.2; wedge 16; mean pa 21  I. Echo 08/13/19  ef  50- 55%. right ventricle is mildly dilated in size with normal systolic function.  A pacer wire is present in the right ventricle. No significant valvular heart disease or evidence for pulmonary hypertension.      3.  Type II diabetes, poorly controlled     4. Hypertriglyceridemia     5. PVCs   A. 48 hour holter (7/17) with 19% PVCs.   B. PVC ablation in 8/17 but PVCs recurred.     C. Holter (11/17) with rare PVCs, < 1%.   D. Holter (9/18) with rare PVCs only     6. Bradycardia/ SSS   A. Dual chamber pacemaker placed ([employer]tronic, MRI compatible).     7. NSVT     8. CAD evaluation   A. Echo 03/27/2018- EF 50%, no regional wall motion abnormalities present, grade 2 DD.  no significant valvular heart disease.   B. Stress 03/28/2018- Completed vasodilator protocol., No ischemic changes with infusion, No arrhythmias,low risk. Perfusion defect noted thought to be diaphragmatic attenuation  Dilated LV with mildly decreased left ventricular function, stress EF 44%.   C. THC 03/30/2018- Non obstructive cad.  CO 5 CI 2.0 wedge 14 mean PA 22       Past Surgical History:   has a past surgical history that includes APPENDECTOMY (OPEN); Cardiac pacemaker placement; Right & Left Heart Cath  w/ Coronary Angios, LV/LA (Right, 03/30/2018); and Right Heart Cath (Right, 06/08/2019).    Family History:  family history includes Diabetes in his brother and paternal grandmother; Heart attack in his sister; Heart disease in his brother, father, and sister; Hypertension in his father; No known problems in his mother.    Social History:   reports that he has quit smoking. He has quit using smokeless tobacco. He reports that he does not drink alcohol and does not use drugs.    Allergies:  has No Known Allergies.    Medications:    Current Outpatient Medications:     Blood Glucose Monitor, generic, Kit, Use as directed to monitor blood sugar, Disp: 1 kit, Rfl: 1    Blood Glucose Monitoring Suppl (OneTouch Ultra 2) w/Device Kit, USE AS DIRECTED TO MONITOR BLOOD SUGAR, Disp: , Rfl:     bumetanide (BUMEX) 2 MG tablet, Take 1 tablets (2 mg total) by mouth every morning AND 2 tablets (4 mg total) Daily after lunch., Disp: 270 tablet, Rfl: 3    Continuous Blood Gluc  Sensor (FreeStyle Libre 2 Sensor Systm) Misc, 1 Device by Does not apply route every 14 (fourteen) days, Disp: 2 each, Rfl: 12    glucose blood test strip, Check BS 5 times daily, Disp: 150 each, Rfl: 11    insulin glargine (LANTUS SOLOSTAR) 100 UNIT/ML injection pen, 36 units twice a day, Disp:  75 mL, Rfl: 3    insulin lispro, 1 Unit Dial, (HumaLOG) 100 UNIT/ML Solution Pen-injector injection pen, INJECT 14 UNITS PLUS SLIDING SCALE 2:50>150 THREE TIMES DAILY BEFORE MEALS. Max 75 U daily., Disp: 75 mL, Rfl: 3    Insulin Pen Needle (Pen Needles 3/16") 31G X 5 MM Misc, 1 each 5 times per day, Disp: 450 each, Rfl: 3    Insulin Syringes, Disposable, U-100 1 ML Misc, 1 each by Does not apply route 2 (two) times daily, Disp: 180 each, Rfl: 0    Lancets Ultra Fine Misc, Check BS 5 times daily, Disp: 150 each, Rfl: 11    lisinopril (ZESTRIL) 5 MG tablet, Take 1 tablet (5 mg total) by mouth daily, Disp: 90 tablet, Rfl: 3    metFORMIN (GLUCOPHAGE) 500 MG tablet, 2 tablets (1000 mg) with breakfast and 1 tablets (500 mg) with dinner (Patient taking differently: 2 tablets (1000 mg) with breakfast and 1 tablets (500 mg) with dinner 1500 total), Disp: 90 tablet, Rfl: 0    potassium chloride (K-DUR) 10 MEQ tablet, Take 1 tablet (10 mEq total) by mouth daily, Disp: 90 tablet, Rfl: 3    Semaglutide, 1 MG/DOSE, (Ozempic, 1 MG/DOSE,) 2 MG/1.5ML Solution Pen-injector, Inject 1 mg into the skin once a week, Disp: 9 mL, Rfl: 3    spironolactone (ALDACTONE) 25 MG tablet, Take 1 tablet (25 mg total) by mouth in the morning., Disp: 90 tablet, Rfl: 3       Objective       Vitals:    Visit Vitals  BP 131/86   Pulse 87   Wt 152.9 kg (337 lb)   SpO2 97%   BMI 43.27 kg/m       Wt Readings from Last 7 Encounters:   07/26/20 152.9 kg (337 lb)   07/19/20 152 kg (335 lb)   05/15/20 152 kg (335 lb)   03/08/20 151 kg (333 lb)   02/05/20 149.7 kg (330 lb)   01/12/20 154.3 kg (340 lb 2.7 oz)   01/12/20 153.9 kg (339 lb 4.6 oz)       Physical Exam:    General: well-appearing overweight male who is alert, NAD  HEENT:  Anicteric sclera  Neck:  JVP ~ 7 mmHg  Lungs: CTA B/L, no rhonchi, wheezes or crackles with normal rate and effort  Cardiovascular: s1s2, normal rate, regular rhythm, no m/r/g, PMI laterally displaced  Abdominal: obese non tender.   Extremities: warm to touch, no edema,  Neuromuscular exam: grossly non-focal, though not formally tested.      Laboratory Data:  Lab Results   Component Value Date/Time    NA 142 05/21/2020 08:27 PM    K 3.8 05/21/2020 08:27 PM    CL 108 05/21/2020 08:27 PM    CO2 21 05/21/2020 08:27 PM    CA 9.9 05/21/2020 08:27 PM    GLU 115 (H) 05/21/2020 08:27 PM    CREAT 1.13 05/21/2020 08:27 PM    BUN 19 05/21/2020 08:27 PM    PROT 7.1 12/26/2018 06:20 PM    ALB 4.2 12/26/2018 06:20 PM    ALKPHOS 67 12/26/2018 06:20 PM    ALT 24 12/26/2018 06:20 PM    AST 15 12/26/2018 06:20 PM    BILITOTAL 0.3 12/26/2018 06:20 PM    AGRATIO 1.45 12/26/2018 06:20 PM    ANIONGAP 16.8 05/21/2020 08:27 PM    BUNCRRATIO 16.8 05/21/2020 08:27 PM    EGFR 74 05/21/2020 08:27 PM    OSMO 286 05/21/2020  08:27 PM    GLOB 2.9 12/26/2018 06:20 PM         Additional Non-ischemic w/u panel:  Lab Results   Component Value Date/Time    TSH 0.98 03/08/2020 10:59 AM    T4FREE Not Indicated 03/08/2020 10:59 AM       No results found for: HIVAGAB    No results found for: HCVAB, HBV, HEPBCOREAB             Lab Results   Component Value Date/Time    WBC 8.9 05/21/2020 08:27 PM    RBC 4.34 05/21/2020 08:27 PM    HGB 13.4 05/21/2020 08:27 PM    HCT 39.8 05/21/2020 08:27 PM    PLT 307 05/21/2020 08:27 PM     Lab Results   Component Value Date/Time    CHOL 206 (H) 05/01/2020 08:17 AM    TRIG 574 (HH) 05/01/2020 08:17 AM    TRIG 228 (H) 03/26/2018 04:24 PM    HDL 36 (L) 05/01/2020 08:17 AM    LDL 79 05/01/2020 08:17 AM     Lab Results   Component Value Date/Time    HGBA1CPERCNT 8.2 03/26/2018 04:24 PM     Lab Results   Component Value Date/Time    BNP 10.2 05/21/2020 08:27  PM              Today's I-stat Labs:    Results       Procedure Component Value Units Date/Time    i-Stat Chem 8 CartrIDge [[id number]]  (Abnormal) Collected: 07/26/20 1412    Specimen: Blood Updated: 07/26/20 1417     Sodium I-Stat 140 mMol/L      Potassium I-Stat 4.2 mMol/L      Chloride I-Stat 100 mMol/L      TCO2 I-Stat 28 mMol/L      Calcium Ionized I-Stat 4.80 mg/dL      Glucose I-Stat 621 mg/dL      Creatinine I-Stat 1.30 mg/dL      BUN I-Stat 20 mg/dL      Anion Gap I-Stat 17.0     EGFR 66 mL/min/1.47m2      Hematocrit I-Stat 41.0 %      Hemoglobin I-Stat 13.9 gm/dL                      Assessment and Plan:  1. Chronic combined systolic and diastolic HF (heart failure)        2. NICM (nonischemic cardiomyopathy)        3. Essential hypertension        4. Renal insufficiency        5. Obesity, morbid, BMI 40.0-49.9        6. SSS (sick sinus syndrome)        7. PVC (premature ventricular contraction)        8. Chronic diastolic HF (heart failure)          Stage c, nyha III. Likely PVC induced.  He is s/p ablation for this. Ef now recovered 50-55% from 30-35%. rhc in 5/21 shows slightly elevated filling pressures ra 12, pa 30/12 (21), pcwp 16, co/ci 5.9/2.2.  Currently does not appear to be well compensated.  Has not had a device check since October.  He has been on gdmt for heart failure.      In terms of gdmt:  -does not tolerate bb due to severe fatigue  -lisinopril 5 mg daily -recommend changing lisinopril to bedtime to decrease dizziness  -spironolactone 25  mg daily  -bumex 2 mg and 4 mg in afternoon  -will hold off on sglt2i for now due to dizziness and his recovered EF.    Provided IV Lasix 160 mg in clinic.  Patient was stable after administration of medication and iv was removed. Patient was discharged.     Reviewed with patient he may take extra diuretics if needed at home and encouraged him to use 2 tablets in the morning and 2 tablets in the afternoon if needed for weight gain at home.  Also reviewed  fluid restriction should be less than 2 L/day    Continues to complain of episodes of dizziness, he was not orthostatic at last office visit.  Had recent device check.  His dizziness can occur with position changes, coughing and or moving his head.  I believe that his dizziness is multifactorial.  I have asked him to start to check his blood sugar to see if it correlates with elevated levels or low levels when he is having episodes of dizziness.    I do not feel that he has a poor short term cardiac prognosis and do not feel that he warrants consideration for advanced heart failure therapies including mechanical circulatory support or heart transplantation.     We will be monitoring closely for changes in heart failure symptoms, electrolyte abnormalities and arrhythmias.    -HF Education: he has been given heart failure education and to include sodium and fluid restrictions as well as weighing daily.  he can call the clinic if she has any questions regarding medications.     -Devices:  he  is not a candidate for cardiac resynchronization therapy.  Has ppm in placed, see above.    -Exercise :  he  is not a candidate for cardiac rehabilitation.    -Counseling:  -I have encouraged pt to continue to follow a <2gm/day Na restriction and limit fluid intake to < 2L/day.  -I have requested pt perform daily wts and contact our office for increase of >2-3lbs in 1 day or 5lbs in 1 week.   -I have encouraged pt to engage in aerobic activity as tolerated and avoid heavy weight lifting.   -Smoking/Alcohol: I have advised pt to abstain from any tobacco use and excessive alcohol intake.    -Weight: Obesity is a significant risk for both short and long-term morbidity and mortality for AHF patients.  I have stressed the importance of weight control and dietary management.     Our office is available to answer any questions you or the patient may have. We appreciate the opportunity to participate in the care of your  patients.    Electronically signed by:    Deatra James. Georg Ruddle, FNP-C  Clinical Coordinator [facility]  [facility]  [facility]  [address]  [place], [place] [address]  [phone]   [phone] - fax         Note: This chart was generated by the [facility] EMR system/speech recognition and may contain inherit omission or errors not intended by the user.  Grammatical errors, random word insertions, deletions, pronoun errors and incomplete sentences are occasionally consequences of this technology due to software limitations.  Not all errors are caught or corrected.  If there are questions or concerns about the content of this note or information contained in the body of this dictation they should be addressed directly with the author for clarification.

## 2020-08-17 ENCOUNTER — Ambulatory Visit
Admission: RE | Admit: 2020-08-17 | Discharge: 2020-08-17 | Disposition: A | Discharge status: Home / Self Care | Payer: 59 | Source: Ambulatory Visit | Attending: Family Nurse Practitioner | Admitting: Family Nurse Practitioner

## 2020-08-17 ENCOUNTER — Encounter: Payer: Self-pay | Admitting: Medical

## 2020-08-17 VITALS — BP 105/84 | HR 96 | Wt 330.1 lb

## 2020-08-17 DIAGNOSIS — E785 Hyperlipidemia, unspecified: Secondary | ICD-10-CM

## 2020-08-17 DIAGNOSIS — I428 Other cardiomyopathies: Secondary | ICD-10-CM

## 2020-08-17 DIAGNOSIS — I493 Ventricular premature depolarization: Secondary | ICD-10-CM

## 2020-08-17 DIAGNOSIS — N289 Disorder of kidney and ureter, unspecified: Secondary | ICD-10-CM | POA: Insufficient documentation

## 2020-08-17 DIAGNOSIS — I495 Sick sinus syndrome: Secondary | ICD-10-CM

## 2020-08-17 DIAGNOSIS — I110 Hypertensive heart disease with heart failure: Secondary | ICD-10-CM | POA: Insufficient documentation

## 2020-08-17 DIAGNOSIS — I5033 Acute on chronic diastolic (congestive) heart failure: Secondary | ICD-10-CM | POA: Insufficient documentation

## 2020-08-17 DIAGNOSIS — I255 Ischemic cardiomyopathy: Secondary | ICD-10-CM | POA: Insufficient documentation

## 2020-08-17 DIAGNOSIS — I5042 Chronic combined systolic (congestive) and diastolic (congestive) heart failure: Secondary | ICD-10-CM

## 2020-08-17 DIAGNOSIS — Z6841 Body Mass Index (BMI) 40.0 and over, adult: Secondary | ICD-10-CM | POA: Insufficient documentation

## 2020-08-17 LAB — I-STAT CHEM 8 CARTRIDGE
Anion Gap I-Stat: 17 — ABNORMAL HIGH (ref 7.0–16.0)
BUN I-Stat: 23 mg/dL — ABNORMAL HIGH (ref 7–22)
Calcium Ionized I-Stat: 5 mg/dL (ref 4.35–5.10)
Chloride I-Stat: 98 mMol/L (ref 98–110)
Creatinine I-Stat: 1.3 mg/dL (ref 0.80–1.30)
EGFR: 66 mL/min/{1.73_m2} (ref 60–150)
Glucose I-Stat: 230 mg/dL — ABNORMAL HIGH (ref 71–99)
Hematocrit I-Stat: 45 % (ref 39.0–52.5)
Hemoglobin I-Stat: 15.3 gm/dL (ref 13.0–17.5)
Potassium I-Stat: 4.1 mMol/L (ref 3.5–5.3)
Sodium I-Stat: 136 mMol/L (ref 136–147)
TCO2 I-Stat: 26 mMol/L (ref 24–29)

## 2020-08-17 MED ORDER — SPIRONOLACTONE 25 MG PO TABS
25.0000 mg | ORAL_TABLET | Freq: Every day | ORAL | 3 refills | Status: DC
Start: 2020-08-17 — End: 2021-10-03

## 2020-08-17 MED ORDER — FUROSEMIDE 10 MG/ML IJ SOLN
160.0000 mg | Freq: Once | INTRAMUSCULAR | Status: AC
Start: 2020-08-17 — End: 2020-08-17
  Administered 2020-08-17: 14:00:00 160 mg via INTRAVENOUS
  Filled 2020-08-17: qty 16

## 2020-08-17 MED ORDER — METOLAZONE 5 MG PO TABS
ORAL_TABLET | ORAL | 11 refills | Status: DC
Start: 2020-08-17 — End: 2020-12-05

## 2020-08-17 NOTE — Progress Notes (Signed)
See:  [facility]      follow-up Visit    Patient Name: Samuel Koch     Date of Birth: May 31, 1970    Provider: Percell Miller, PA -C      Primary Cardiologist: Lovey Newcomer. [name], DO    Chief Complaint: Follow-up systolic heart failure  Subjective     HPI:  I had the pleasure of seeing Mr. Samuel Nettle Clyne today at [facility] for an advanced heart failure visit.  [name] is a 50 y.o. male with nonischemic cardiomyopathy,  EF 50-55%, NYHA Class 3, ACC/AHA Stage C.  Samuel Koch also has a history of  has a past medical history of Cardiomyopathy, Congestive heart failure, Diabetes mellitus, Hypertension, and PVC's (premature ventricular contractions)..      From a cardiovascular standpoint, [place] has not done well.  He has had no hospital admissions or emergency room visits.    Mr. Menna was seen 3 weeks ago urgently due to weight gain of 5 pounds overnight.  This may have been due to dietary indiscretion.  At that time he did not take any increased doses of Bumex.  He was given Lasix 160 mg at that visit.  He states his weight went down 10 pounds.    Since then however his weight has crept up.  He states that he is 3 pounds higher than it was at the lower dose after his visit.  He has had increasing abdominal distention.  Also he has noted increasing shortness of breath over the last few days.  He became short of breath walking from the parking lot.  He gets occasional sharp chest pains that only last for few seconds.  He will get occasional pains with walking.  He has not had any PND orthopnea.  He will get palpitations and orthostatic dizziness.  He did notice some swelling in his feet yesterday.    He feels that the Bumex is not working.  Yesterday he took an additional 1 mg in the evening and did not notice any increased urine output.  His weight was down 1 pound.    Additionally his sugars have been running high    Samuel Koch  reports that he has not been exercising.  He feels he needs a handicap marker because he is limited in his activity.  He does check blood pressures at home; readings have been approximately 100-120 mmHg.    He has been checking morning weights; his weight has been fairly stable but might be ending up  Samuel Koch has been following a fluid restriction of 2000 mL per day better and has restricting dietary sodium content to no more than 2 gm/day.    He reports that he has been compliant with his medications.      Review of Systems:  Review of Systems   Constitutional:  Positive for malaise/fatigue.   HENT: Negative.     Eyes: Negative.    Respiratory:  Positive for shortness of breath.    Cardiovascular:  Positive for leg swelling.   Gastrointestinal:  Positive for heartburn.   Genitourinary: Negative.    Musculoskeletal: Negative.    Skin: Negative.    Neurological:         Leg fatigue   Endo/Heme/Allergies: Negative.    Psychiatric/Behavioral: Negative.      Comprehensive 12 point review of system is negative unless described above in HPI    Review of Cardiovascular Testing:  EKG (date):  1. Chronic combined systolic and diastolic CHF     2. Nonischemic cardiomyopathy   A. LHC (6/17): 30% mid RCA, no other significant disease.   B. Echo (6/17): EF 30-35%.   C. Holter (7/17): 19% PVCs.   D. Echo (3/18): EF 50-55%, moderate LVH, grade II diastolic dysfunction.   E. Echo (9/18): EF 50-55%   F. Echo 10/07/17- LV cavity size is increased. The LVEF is est. at 50%. Mild hypokinesis of the distal septum and apex which may be due to paced rhythm. Pacer wire present in RV.   G. Echo 06/12/2018- Mild concentric LVH, EF 50-55%, aortic root is mildy dilated, aortic root is 3.9cm, tricuspid regurgitation is mild, RSVP .   H. RHC 06/08/2019 co 5.9/ ci 2.2; wedge 16; mean pa 21  I. Echo 08/13/19  ef  50- 55%. right ventricle is mildly dilated in size with normal systolic function.  A pacer wire is present in the right ventricle. No  significant valvular heart disease or evidence for pulmonary hypertension.      3. Type II diabetes, poorly controlled     4. Hypertriglyceridemia     5. PVCs   A. 48 hour holter (7/17) with 19% PVCs.   B. PVC ablation in 8/17 but PVCs recurred.     C. Holter (11/17) with rare PVCs, < 1%.   D. Holter (9/18) with rare PVCs only     6. Bradycardia/ SSS   A. Dual chamber pacemaker placed (Medtronic, MRI compatible).     7. NSVT     8. CAD evaluation   A. Echo 03/27/2018- EF 50%, no regional wall motion abnormalities present, grade 2 DD.  no significant valvular heart disease.   B. Stress 03/28/2018- Completed vasodilator protocol., No ischemic changes with infusion, No arrhythmias,low risk. Perfusion defect noted thought to be diaphragmatic attenuation  Dilated LV with mildly decreased left ventricular function, stress EF 44%.   C. THC 03/30/2018- Non obstructive cad.  CO 5 CI 2.0 wedge 14 mean PA 22       Past Surgical History:   has a past surgical history that includes APPENDECTOMY (OPEN); Cardiac pacemaker placement; Right & Left Heart Cath  w/ Coronary Angios, LV/LA (Right, 03/30/2018); and Right Heart Cath (Right, 06/08/2019).    Family History:  family history includes Diabetes in his brother and paternal grandmother; Heart attack in his sister; Heart disease in his brother, father, and sister; Hypertension in his father; No known problems in his mother.  Married    Social History:   reports that he has quit smoking. He has quit using smokeless tobacco. He reports that he does not drink alcohol and does not use drugs.    Allergies:  has No Known Allergies.    Medications:    Current Outpatient Medications:     Blood Glucose Monitor, generic, Kit, Use as directed to monitor blood sugar, Disp: 1 kit, Rfl: 1    Blood Glucose Monitoring Suppl (OneTouch Ultra 2) w/Device Kit, USE AS DIRECTED TO MONITOR BLOOD SUGAR, Disp: , Rfl:     bumetanide (BUMEX) 2 MG tablet, Take 1 tablets (2 mg total) by mouth every morning AND 2  tablets (4 mg total) Daily after lunch., Disp: 270 tablet, Rfl: 3    Continuous Blood Gluc Sensor (FreeStyle Libre 2 Sensor Systm) Misc, 1 Device by Does not apply route every 14 (fourteen) days, Disp: 2 each, Rfl: 12    glucose blood test strip, Check BS 5 times daily, Disp: 150 each,  Rfl: 11    insulin glargine (LANTUS SOLOSTAR) 100 UNIT/ML injection pen, 36 units twice a day, Disp: 75 mL, Rfl: 3    insulin lispro, 1 Unit Dial, (HumaLOG) 100 UNIT/ML Solution Pen-injector injection pen, INJECT 14 UNITS PLUS SLIDING SCALE 2:50>150 THREE TIMES DAILY BEFORE MEALS. Max 75 U daily., Disp: 75 mL, Rfl: 3    Insulin Pen Needle (Pen Needles 3/16") 31G X 5 MM Misc, 1 each 5 times per day, Disp: 450 each, Rfl: 3    Insulin Syringes, Disposable, U-100 1 ML Misc, 1 each by Does not apply route 2 (two) times daily, Disp: 180 each, Rfl: 0    Lancets Ultra Fine Misc, Check BS 5 times daily, Disp: 150 each, Rfl: 11    lisinopril (ZESTRIL) 5 MG tablet, Take 1 tablet (5 mg total) by mouth daily, Disp: 90 tablet, Rfl: 3    metFORMIN (GLUCOPHAGE) 500 MG tablet, 2 tablets (1000 mg) with breakfast and 1 tablets (500 mg) with dinner (Patient taking differently: 2 tablets (1000 mg) with breakfast and 1 tablets (500 mg) with dinner 1500 total), Disp: 90 tablet, Rfl: 0    potassium chloride (K-DUR) 10 MEQ tablet, Take 1 tablet (10 mEq total) by mouth daily, Disp: 90 tablet, Rfl: 3    Semaglutide, 1 MG/DOSE, (Ozempic, 1 MG/DOSE,) 2 MG/1.5ML Solution Pen-injector, Inject 1 mg into the skin once a week, Disp: 9 mL, Rfl: 3    metOLazone (ZAROXOLYN) 5 MG tablet, 1 tablet daily half hour prior to a.m. Bumex, Disp: 30 tablet, Rfl: 11    spironolactone (ALDACTONE) 25 MG tablet, Take 1 tablet (25 mg total) by mouth daily, Disp: 90 tablet, Rfl: 3  No current facility-administered medications for this encounter.       Objective     Visit Vitals  BP 105/84   Pulse 96   Wt 149.7 kg (330 lb 2 oz)   SpO2 97%   BMI 42.39 kg/m       Wt Readings from Last 7  Encounters:   08/17/20 149.7 kg (330 lb 2 oz)   07/26/20 152.9 kg (337 lb)   07/19/20 152 kg (335 lb)   05/15/20 152 kg (335 lb)   03/08/20 151 kg (333 lb)   02/05/20 149.7 kg (330 lb)   01/12/20 154.3 kg (340 lb 2.7 oz)       Physical Exam:   General: well-developed male, alert, NAD  HEENT:  Anicteric sclera  Neck:  no JVD, no carotid bruits. Carotid pulses are 2+ bilaterally  Lungs: CTA B/L, no rhonchi, wheezes or crackles with normal respiratory effort  Cardiovascular: s1s2, normal rate, with frequent extrasystoleno, no m/r/g  Abdominal: soft, NT,  mildly distended +B.S., no HSM.   Extremities: warm to touch, no cyanosis, no edema, peripheral pulses are 2+ bilaterally.  Neuromuscular exam: grossly non-focal.  Skin: no rash or ulceration.      Laboratory Data:  Lab Results   Component Value Date/Time    NA 142 05/21/2020 08:27 PM    K 3.8 05/21/2020 08:27 PM    CL 108 05/21/2020 08:27 PM    CO2 21 05/21/2020 08:27 PM    CA 9.9 05/21/2020 08:27 PM    GLU 115 (H) 05/21/2020 08:27 PM    CREAT 1.30 08/17/2020 12:42 PM    CREAT 1.13 05/21/2020 08:27 PM    BUN 19 05/21/2020 08:27 PM    PROT 7.1 12/26/2018 06:20 PM    ALB 4.2 12/26/2018 06:20 PM    ALKPHOS 67  12/26/2018 06:20 PM    ALT 24 12/26/2018 06:20 PM    AST 15 12/26/2018 06:20 PM    BILITOTAL 0.3 12/26/2018 06:20 PM    AGRATIO 1.45 12/26/2018 06:20 PM    ANIONGAP 16.8 05/21/2020 08:27 PM    BUNCRRATIO 16.8 05/21/2020 08:27 PM    EGFR 66 08/17/2020 12:42 PM    OSMO 286 05/21/2020 08:27 PM    GLOB 2.9 12/26/2018 06:20 PM         Additional Non-ischemic w/u panel:  Lab Results   Component Value Date/Time    TSH 0.98 03/08/2020 10:59 AM    T4FREE Not Indicated 03/08/2020 10:59 AM       No results found for: HIVAGAB    No results found for: HCVAB, HBV, HEPBCOREAB             Lab Results   Component Value Date/Time    WBC 8.9 05/21/2020 08:27 PM    RBC 4.34 05/21/2020 08:27 PM    HGB 13.4 05/21/2020 08:27 PM    HCT 39.8 05/21/2020 08:27 PM    PLT 307 05/21/2020 08:27  PM     Lab Results   Component Value Date/Time    CHOL 206 (H) 05/01/2020 08:17 AM    TRIG 574 (HH) 05/01/2020 08:17 AM    TRIG 228 (H) 03/26/2018 04:24 PM    HDL 36 (L) 05/01/2020 08:17 AM    LDL 79 05/01/2020 08:17 AM     Lab Results   Component Value Date/Time    HGBA1CPERCNT 8.2 03/26/2018 04:24 PM     Lab Results   Component Value Date/Time    BNP 10.2 05/21/2020 08:27 PM              Today's I-stat Labs:  Results       Procedure Component Value Units Date/Time    i-Stat Chem 8 CartrIDge [[id number]]  (Abnormal) Collected: 08/17/20 1242    Specimen: Blood Updated: 08/17/20 1246     Sodium I-Stat 136 mMol/L      Potassium I-Stat 4.1 mMol/L      Chloride I-Stat 98 mMol/L      TCO2 I-Stat 26 mMol/L      Calcium Ionized I-Stat 5.00 mg/dL      Glucose I-Stat 409 mg/dL      Creatinine I-Stat 1.30 mg/dL      BUN I-Stat 23 mg/dL      Anion Gap I-Stat 17.0     EGFR 66 mL/min/1.13m2      Hematocrit I-Stat 45.0 %      Hemoglobin I-Stat 15.3 gm/dL                  Assessment and Plan:  1. Chronic combined systolic and diastolic HF (heart failure)    2. NICM (nonischemic cardiomyopathy)    3. PVC (premature ventricular contraction)    4. Obesity, morbid, BMI 40.0-49.9    5. Dyslipidemia    6. SSS (sick sinus syndrome)             My impression is that, Mr. Tse has a stage C, nonischemic cardiomyopathy and currently reports NYHA Class III level of symptoms.    he  is not well compensated and well perfused on exam. he does not have evidence of end-organ hypoperfusion based on labs and clinical exam.     1.  Mr. Fassnacht is somewhat fluid overloaded again.  He states that he is following diet better but may be drinking more water.  His Bumex  was less responsive.  We will continue on the same dose of Bumex but will add metolazone 5 mg prior to his a.m. dose of Bumex.  Will give 160 mg Lasix IV  2.  Emphasized sodium and fluid restrictions    I do not feel that he has a poor short term cardiac prognosis and do not feel that he  warrants consideration for advanced heart failure therapies including mechanical circulatory support or heart transplantation.     We will be monitoring closely for changes in heart failure symptoms, electrolyte abnormalities and arrhythmias.    In regards to OMM:  Beta-blocker: no -tdoes not tolerate  ACE-I/ARB/ARNI:yes  Aldosterone antagonist:yes  Hydralazine/nitrate: no   Digoxin: no    SGLT2:  no -EF has improved    Med Changes: Continue Bumex 2 mg in the morning and 4 mg after lunch.  Will start metolazone 5mg  half hour prior to a.m. Bumex.  He may take an additional 40 mg in the evening or weight gain greater than 2 to 3 pounds in 1 day or 5 pounds in 1 week.    Devices - CRT-D:   he  is not a candidate for cardiac resynchronization therapy.    No ICD indicated as EF >35-40%, therefore he is not a candidate for ICD therapy.      Diuretics: Bumex and metolazone as noted above    Exercise :  he  is a candidate for cardiac rehabilitation.  We discuss with him in the future.  He was given a significant pass for the time being.    Counseling:  -I have encouraged pt to continue to follow a <2gm/day Na restriction and emphasized limit fluid intake to < 2L/day.  -I have requested pt perform daily wts and contact our office for increase of >2-3lbs in 1 day or 5lbs in 1 week.   -I have encouraged pt to engage in aerobic activity as tolerated and avoid heavy weight lifting.   -Smoking/Alcohol: I have advised pt to abstain from any tobacco use and excessive alcohol intake.    -Weight: Obesity is a significant risk for both short and long-term morbidity and mortality for AHF patients.  I have stressed the importance of weight control and dietary management.        -Follow up: 3 to 4 weeks    Orders Placed This Encounter   Procedures    Basic Metabolic Panel     Standing Status:   Future     Standing Expiration Date:   08/17/2021     Order Specific Question:   Has the patient fasted?     Answer:   No     Order Specific  Question:   Release to patient     Answer:   Immediate    i-Stat Chem 8 CartrIDge     Standing Status:   Standing     Number of Occurrences:   1         Our office is available to answer any questions you or the patient may have. We appreciate the opportunity to participate in the care of your patients.    Electronically signed by:    Sima Matas, [facility]  [facility]  [facility]  [address]  [place], [place] [address]  [phone]   [phone] - fax         Note: This chart was generated by the [facility] EMR system/speech recognition and may contain inherit omission or errors  not intended by the user.  Grammatical errors, random word insertions, deletions, pronoun errors and incomplete sentences are occasionally consequences of this technology due to software limitations.  Not all errors are caught or corrected.  If there are questions or concerns about the content of this note or information contained in the body of this dictation they should be addressed directly with the author for clarification.

## 2020-08-17 NOTE — Patient Instructions (Signed)
We will give Lasix 160 mg IV now  Continue Bumex 2 mg in the morning and 4 mg after lunch.  Will add metolazone 5 mg half hour prior to his a.m. dose of Bumex.  BMP in 1 week  Continue spironolactone 25 mg daily  Continue 2 g sodium and 2 L fluid restriction  Will give signed handicap pass to take to the [facility]  Follow-up in 3 to 4 weeks

## 2020-08-28 ENCOUNTER — Other Ambulatory Visit (INDEPENDENT_AMBULATORY_CARE_PROVIDER_SITE_OTHER): Payer: Self-pay

## 2020-08-28 DIAGNOSIS — E1165 Type 2 diabetes mellitus with hyperglycemia: Secondary | ICD-10-CM

## 2020-08-28 MED ORDER — GLUCOSE BLOOD VI STRP
ORAL_STRIP | 3 refills | Status: DC
Start: 2020-08-28 — End: 2021-09-13

## 2020-08-28 NOTE — Telephone Encounter (Signed)
Sent test strips refill per request

## 2020-09-07 ENCOUNTER — Ambulatory Visit: Payer: Self-pay | Admitting: Family Nurse Practitioner

## 2020-09-14 ENCOUNTER — Other Ambulatory Visit (INDEPENDENT_AMBULATORY_CARE_PROVIDER_SITE_OTHER): Payer: Self-pay | Admitting: Registered Nurse

## 2020-09-15 ENCOUNTER — Ambulatory Visit (INDEPENDENT_AMBULATORY_CARE_PROVIDER_SITE_OTHER): Payer: 59 | Admitting: Registered Nurse

## 2020-09-15 LAB — HEMOGLOBIN A1C: Hemoglobin A1C: 7.5 % — ABNORMAL HIGH (ref 4.8–5.6)

## 2020-09-15 NOTE — Progress Notes (Signed)
A1c 7.5,  same as last check

## 2020-09-22 ENCOUNTER — Ambulatory Visit (INDEPENDENT_AMBULATORY_CARE_PROVIDER_SITE_OTHER): Payer: 59 | Admitting: Registered Nurse

## 2020-11-28 ENCOUNTER — Ambulatory Visit
Admission: RE | Admit: 2020-11-28 | Discharge: 2020-11-28 | Disposition: A | Discharge status: Home / Self Care | Payer: 59 | Source: Ambulatory Visit | Attending: Family | Admitting: Family

## 2020-11-28 ENCOUNTER — Telehealth: Payer: Self-pay

## 2020-11-28 ENCOUNTER — Encounter: Payer: Self-pay | Admitting: Family

## 2020-11-28 VITALS — BP 121/77 | HR 88 | Wt 349.0 lb

## 2020-11-28 DIAGNOSIS — E119 Type 2 diabetes mellitus without complications: Secondary | ICD-10-CM

## 2020-11-28 DIAGNOSIS — I10 Essential (primary) hypertension: Secondary | ICD-10-CM

## 2020-11-28 DIAGNOSIS — R600 Localized edema: Secondary | ICD-10-CM

## 2020-11-28 DIAGNOSIS — Z794 Long term (current) use of insulin: Secondary | ICD-10-CM

## 2020-11-28 DIAGNOSIS — I5032 Chronic diastolic (congestive) heart failure: Secondary | ICD-10-CM | POA: Insufficient documentation

## 2020-11-28 DIAGNOSIS — R0602 Shortness of breath: Secondary | ICD-10-CM

## 2020-11-28 DIAGNOSIS — I110 Hypertensive heart disease with heart failure: Secondary | ICD-10-CM | POA: Insufficient documentation

## 2020-11-28 DIAGNOSIS — Z950 Presence of cardiac pacemaker: Secondary | ICD-10-CM | POA: Insufficient documentation

## 2020-11-28 LAB — I-STAT CHEM 8 CARTRIDGE
Anion Gap I-Stat: 18 — ABNORMAL HIGH (ref 7.0–16.0)
BUN I-Stat: 25 mg/dL — ABNORMAL HIGH (ref 7–22)
Calcium Ionized I-Stat: 5 mg/dL (ref 4.35–5.10)
Chloride I-Stat: 105 mMol/L (ref 98–110)
Creatinine I-Stat: 1.2 mg/dL (ref 0.80–1.30)
EGFR: 73 mL/min/{1.73_m2} (ref 60–150)
Glucose I-Stat: 238 mg/dL — ABNORMAL HIGH (ref 71–99)
Hematocrit I-Stat: 38 % — ABNORMAL LOW (ref 39.0–52.5)
Hemoglobin I-Stat: 12.9 gm/dL — ABNORMAL LOW (ref 13.0–17.5)
Potassium I-Stat: 3.8 mMol/L (ref 3.5–5.3)
Sodium I-Stat: 141 mMol/L (ref 136–147)
TCO2 I-Stat: 24 mMol/L (ref 24–29)

## 2020-11-28 MED ORDER — EMPAGLIFLOZIN 10 MG PO TABS
10.0000 mg | ORAL_TABLET | Freq: Every morning | ORAL | 2 refills | Status: DC
Start: 2020-11-28 — End: 2021-02-19

## 2020-11-28 MED ORDER — VH POTASSIUM CHLORIDE CRYS ER 20 MEQ PO TBCR (WRAP)
60.0000 meq | EXTENDED_RELEASE_TABLET | Freq: Once | ORAL | Status: AC
Start: 2020-11-28 — End: 2020-11-28
  Administered 2020-11-28: 16:00:00 60 meq via ORAL
  Filled 2020-11-28: qty 3

## 2020-11-28 MED ORDER — FUROSEMIDE 10 MG/ML IJ SOLN
120.0000 mg | Freq: Once | INTRAMUSCULAR | Status: AC
Start: 2020-11-28 — End: 2020-11-28
  Administered 2020-11-28: 16:00:00 120 mg via INTRAVENOUS
  Filled 2020-11-28: qty 12

## 2020-11-28 NOTE — Progress Notes (Signed)
See:  [facility]      follow-up Visit    Patient Name: Samuel Koch     Date of Birth: 1970/02/16    Provider: Merita Norton, NP -C      Primary Cardiologist: Lovey Newcomer. [name], DO  Primary care provider:  Dr. [name]    Chief Complaint:    Follow-up   Subjective     HPI:  I had the pleasure of seeing Mr. Patricia Nettle Briski today at [facility] for an advanced heart failure visit.  Darsh Vandevoort is a 50 y.o. male with nonischemic cardiomyopathy,  EF 50-55% (08/13/2019); NYHA Class 3, ACC/AHA Stage C.  Patricia Nettle Gell also  has a past medical history of Cardiomyopathy, Congestive heart failure, Diabetes mellitus, Hypertension, and PVC's (premature ventricular contractions)..      From a cardiovascular standpoint, Mr. Witucki states has not done well.      He has had no hospital admissions or emergency room visits since last office visit on 08/17/2020.Marland Kitchen    He went on trip last week and weight trended up from 328 lbs to 340 lbs.  He has noted increase in exertional SOB, abdominal swelling and lower extremity edema.  Continues with fatigue.  He is currently taking Bumex 2 mg twice a day.   He has taken Metolazone 5 mg PRN for weight gain, but has severe generalized muscle cramping when he takes the Metolazone, and he does not not to continue on this medication.  He has not checked B/P recently.     Kinley reports that he has not been exercising recently, but three weeks ago, he was walking everyday.   He does not currently check blood pressures     He has been checking morning weights; his weight as noted above  Shilo has been following a fluid restriction of 2000 mL per day better and has restricting dietary sodium content to no more than 2 gm/day.    He reports that he has been compliant with his medications.      Review of Systems:  Review of Systems   Constitutional:  Positive for malaise/fatigue. Negative for chills, fever and weight loss.   Respiratory:   Positive for shortness of breath. Negative for cough.    Cardiovascular:  Positive for leg swelling. Negative for chest pain and palpitations.   Gastrointestinal:  Negative for abdominal pain, blood in stool, constipation, diarrhea, nausea and vomiting.   Genitourinary:  Negative for dysuria and hematuria.   Neurological:  Negative for dizziness.    Comprehensive 12 point review of system is negative unless described above in HPI    Review of Cardiovascular Testing:  EKG (07/19/2020):  sinus rhythm. Ventricular rate 87.     PMH - 06/23/2020 - CC/DG  LOV - 12/03/2019 - CC  LABS - 05/21/2020 - In Epic   LAST EKG - 01/12/2020    ED - 05/21/2020 - Acute gout    1. Chronic combined systolic and diastolic CHF   A. RHC 06/08/19- wedge pressure 16, RB pressure 33/7. Pulmonary artery pressure 30/12    2. Nonischemic cardiomyopathy   A. LHC (6/17): 30% mid RCA, no other significant disease.   B. Echo (6/17): EF 30-35%.   C. Holter (7/17): 19% PVCs.   D. Echo (3/18): EF 50-55%, moderate LVH, grade II diastolic dysfunction.   E. Echo (9/18): EF 50-55%.   F. Echo 10/07/17- LV cavity size is increased. The LVEF is est. at 50%.  Mild hypokinesis of the distal septum and apex which may be due to paced rhythm. Pacer wire present in RV.   G. Echo 05/22/18- EF 50-55%. Grade II DD. Mitral valve leaflets mildly thickened without stenosis. Trace MR. Trace TR. Estimated pulmonary arterial pressure is 25-30 mmHg.   H. Echo 06/12/2018- Mild concentric LVH, EF 50-55%, aortic root is mildy dilated, aortic root is 3.9cm, tricuspid regurgitation is mild, RSVP .   I. Echo 08/13/19- EF 50-55%. Grade I diastolic dysfunction of the left ventricle (impaired relaxation pattern). The left ventricular ejection fraction is low normal, estimated at 50- 55%. RV mildly dilated in size with normal systolic function. A pacer wire is present in RV. No significant valvular heart disease or evidence for pulmonary hypertension.     3. Acute Coronary Syndrome   A.  Echo 03/27/2018- Technically difficult study with limited endocardial definition and structural resolution in the apical views, Intravenous contrast was administered to enhance endocardial definition, LV cavity size is norma, mild concentric left ventricular hypertrophy, LV systolic function is at the lower limits of normal, EF 50%, no regional wall motion abnormalities present, LV diastolic filling pattern is probably pseudonormalized, consistent with grade 2 left ventricular diastolic dysfunction and elevated left atrial pressure, Right-sided chambers are not optimally visualized but appear grossly normal in size, Pacemaker lead is visualized within the right heart chamber, no significant valvular heart disease.   B. Stress 03/28/2018- Completed vasodilator protocol., No ischemic changes with infusion, No arrhythmias, Overall, low risk vasodilator stress rest SPECT perfusion study, a large moderate to severe perfusion defect involving the apex, inferior stripe, mid and basal inferoseptal and inferolateral segments with mild reversibility suggestive of possible ischemia but with likely strong component of diaphragmatic attenuation artifact, Also anteroseptal perfusion defect from attenuation artifact, No significant findings of ischemia Noted, Dilated LV with mildly decreased left ventricular function, stress EF 44%.   C. THC 03/30/2018- Negative heart cath. False positive stress test.     4. Bradycardia/ SSS   A. 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 [facility])     5. PVCs   A. 48 hour holter (7/17) with 19% PVCs.   B. PVC ablation in 8/17 but PVCs recurred.   C. Holter (11/17) with rare PVCs, < 1%.   D. Holter (9/18) with rare PVCs only. 6. NSVT   7. Type II diabetes   8. Hypertriglyceridemia       Past Surgical History:   has a past surgical history that includes APPENDECTOMY (OPEN); Cardiac pacemaker placement; Right & Left Heart Cath  w/ Coronary Angios, LV/LA (Right, 03/30/2018);  and Right Heart Cath (Right, 06/08/2019).    Family History:  family history includes Diabetes in his brother and paternal grandmother; Heart attack in his sister; Heart disease in his brother, father, and sister; Hypertension in his father; No known problems in his mother.  Married    Social History:   reports that he has quit smoking. He has quit using smokeless tobacco. He reports that he does not drink alcohol and does not use drugs.    Allergies:  has No Known Allergies.    Medications:    Current Outpatient Medications:     Blood Glucose Monitor, generic, Kit, Use as directed to monitor blood sugar, Disp: 1 kit, Rfl: 1    Blood Glucose Monitoring Suppl (OneTouch Ultra 2) w/Device Kit, USE AS DIRECTED TO MONITOR BLOOD SUGAR, Disp: , Rfl:     bumetanide (BUMEX) 2 MG tablet, Take 1 tablets (  2 mg total) by mouth every morning AND 2 tablets (4 mg total) Daily after lunch. (Patient taking differently: 2 mg Take 2 tablets (4 mg total) by mouth every morning AND 2 tablets (4 mg total) Daily after lunch.), Disp: 270 tablet, Rfl: 3    Continuous Blood Gluc Sensor (FreeStyle Libre 2 Sensor Systm) Misc, 1 Device by Does not apply route every 14 (fourteen) days, Disp: 2 each, Rfl: 12    glucose blood test strip, Check BS 3 times daily, Disp: 270 each, Rfl: 3    insulin glargine (LANTUS SOLOSTAR) 100 UNIT/ML injection pen, 36 units twice a day, Disp: 75 mL, Rfl: 3    insulin lispro, 1 Unit Dial, (HumaLOG) 100 UNIT/ML Solution Pen-injector injection pen, INJECT 14 UNITS PLUS SLIDING SCALE 2:50>150 THREE TIMES DAILY BEFORE MEALS. Max 75 U daily., Disp: 75 mL, Rfl: 3    Insulin Pen Needle (Pen Needles 3/16") 31G X 5 MM Misc, 1 each 5 times per day, Disp: 450 each, Rfl: 3    Insulin Syringes, Disposable, U-100 1 ML Misc, 1 each by Does not apply route 2 (two) times daily, Disp: 180 each, Rfl: 0    Lancets Ultra Fine Misc, Check BS 5 times daily, Disp: 150 each, Rfl: 11    lisinopril (ZESTRIL) 5 MG tablet, Take 1 tablet (5 mg  total) by mouth daily, Disp: 90 tablet, Rfl: 3    metFORMIN (GLUCOPHAGE) 500 MG tablet, 2 tablets (1000 mg) with breakfast and 1 tablets (500 mg) with dinner (Patient taking differently: 2 tablets (1000 mg) with breakfast and 1 tablets (500 mg) with dinner 1500 total), Disp: 90 tablet, Rfl: 0    metOLazone (ZAROXOLYN) 5 MG tablet, 1 tablet daily half hour prior to a.m. Bumex (Patient taking differently: 1 tablet daily half hour prior to a.m. Bumex PRN.), Disp: 30 tablet, Rfl: 11    potassium chloride (K-DUR) 10 MEQ tablet, Take 1 tablet (10 mEq total) by mouth daily, Disp: 90 tablet, Rfl: 3    spironolactone (ALDACTONE) 25 MG tablet, Take 1 tablet (25 mg total) by mouth daily, Disp: 90 tablet, Rfl: 3    Semaglutide, 1 MG/DOSE, (Ozempic, 1 MG/DOSE,) 2 MG/1.5ML Solution Pen-injector, Inject 1 mg into the skin once a week (Patient not taking: Reported on 11/28/2020), Disp: 9 mL, Rfl: 3       Objective     Visit Vitals  BP 121/77   Pulse 88   Wt 158.3 kg (348 lb 15.8 oz)   SpO2 97%   BMI 44.81 kg/m       Wt Readings from Last 7 Encounters:   11/28/20 158.3 kg (348 lb 15.8 oz)   08/17/20 149.7 kg (330 lb 2 oz)   07/26/20 152.9 kg (337 lb)   07/19/20 152 kg (335 lb)   05/15/20 152 kg (335 lb)   03/08/20 151 kg (333 lb)   02/05/20 149.7 kg (330 lb)       Physical Exam:   General: well-developed male, alert, NAD  HEENT:  Anicteric sclera  Neck:  no JVD, no carotid bruits noted  Lungs: bilateral breath sounds clear; no rhonchi, wheezes or crackles with normal respiratory effort  Cardiovascular: s1s2, normal rate, no m/r/g  Abdominal: protuberant; soft; non-tender  Extremities: skin warm and dry; +1 edema both lower legs/feet  Neuromuscular exam: grossly non-focal.  Skin: no rash or ulceration.      Laboratory Data:  Lab Results   Component Value Date/Time    NA 142 05/21/2020 08:27 PM  K 3.8 05/21/2020 08:27 PM    CL 108 05/21/2020 08:27 PM    CO2 21 05/21/2020 08:27 PM    CA 9.9 05/21/2020 08:27 PM    GLU 115 (H)  05/21/2020 08:27 PM    CREAT 1.30 08/17/2020 12:42 PM    CREAT 1.13 05/21/2020 08:27 PM    BUN 19 05/21/2020 08:27 PM    PROT 7.1 12/26/2018 06:20 PM    ALB 4.2 12/26/2018 06:20 PM    ALKPHOS 67 12/26/2018 06:20 PM    ALT 24 12/26/2018 06:20 PM    AST 15 12/26/2018 06:20 PM    BILITOTAL 0.3 12/26/2018 06:20 PM    AGRATIO 1.45 12/26/2018 06:20 PM    ANIONGAP 16.8 05/21/2020 08:27 PM    BUNCRRATIO 16.8 05/21/2020 08:27 PM    EGFR 66 08/17/2020 12:42 PM    OSMO 286 05/21/2020 08:27 PM    GLOB 2.9 12/26/2018 06:20 PM         Additional Non-ischemic w/u panel:  Lab Results   Component Value Date/Time    TSH 0.98 03/08/2020 10:59 AM    T4FREE Not Indicated 03/08/2020 10:59 AM       No results found for: HIVAGAB    No results found for: HCVAB, HBV, HEPBCOREAB     Lab Results   Component Value Date/Time    WBC 8.9 05/21/2020 08:27 PM    RBC 4.34 05/21/2020 08:27 PM    HGB 13.4 05/21/2020 08:27 PM    HCT 39.8 05/21/2020 08:27 PM    PLT 307 05/21/2020 08:27 PM     Lab Results   Component Value Date/Time    CHOL 206 (H) 05/01/2020 08:17 AM    TRIG 574 (HH) 05/01/2020 08:17 AM    TRIG 228 (H) 03/26/2018 04:24 PM    HDL 36 (L) 05/01/2020 08:17 AM    LDL 79 05/01/2020 08:17 AM     Lab Results   Component Value Date/Time    HGBA1CPERCNT 8.2 03/26/2018 04:24 PM     Lab Results   Component Value Date/Time    BNP 10.2 05/21/2020 08:27 PM              Today's I-stat Labs:  Results       Procedure Component Value Units Date/Time    i-Stat Chem 8 CartrIDge [[id number]]  (Abnormal) Collected: 11/28/20 1507    Specimen: Blood Updated: 11/28/20 1510     Sodium I-Stat 141 mMol/L      Potassium I-Stat 3.8 mMol/L      Chloride I-Stat 105 mMol/L      TCO2 I-Stat 24 mMol/L      Calcium Ionized I-Stat 5.00 mg/dL      Glucose I-Stat 409 mg/dL      Creatinine I-Stat 1.20 mg/dL      BUN I-Stat 25 mg/dL      Anion Gap I-Stat 81.1     EGFR 73 mL/min/1.97m2      Hematocrit I-Stat 38.0 %      Hemoglobin I-Stat 12.9 gm/dL              Assessment and  Plan:  1. Chronic heart failure with preserved ejection fraction    2. Essential hypertension    3. Bilateral leg edema    4. Exertional shortness of breath    5. Type 2 diabetes mellitus treated with insulin    6. S/P placement of cardiac pacemaker           My impression is that, Mr. Jasmin  has a stage C, nonischemic cardiomyopathy and currently reports NYHA Class III level of symptoms.    he  is not well compensated and well perfused on exam. he does not have evidence of end-organ hypoperfusion based on labs and clinical exam.     We will be monitoring closely for changes in heart failure symptoms, electrolyte abnormalities and arrhythmias.    In regards to OMM:  Beta-blocker: no -does not tolerate  ACE-I/ARB/ARNI:yes; Lisinopril 5 mg daily  Aldosterone antagonist:yes; Aldosterone 25 mg once a day  Hydralazine/nitrate: no   Digoxin: no    SGLT2i:  no;   will start Jardiance 10 mg daily    Med Changes and plan:  His weight is up 19 pounds since office visit in July 2022; recent home weight has trended up  He received Lasix 120 mg IV in clinic today  Serum potassium 3.8 today; he received Klor-con 60 meq by mouth in clinic  Tomorrow, resume home Bumex 2 mg by mouth twice a day, and K-Dur 10 meq by mouth once a day  Reviewed actions/benefits of adding SGLT2i and reportable symptoms. Instructed to start  Jardiance 10 mg daily  Reviewed his RBG today 238; importance of monitoring BG and f/u with PCP or Endocrinology office  Chem 8 in one week  Follow up in clinic in 3-4 weeks; sooner if needed  Most recent echo July 2021; ordered follow up echo.  He has cancelled previous appointments with cardiology.  I will send message to scheduling at [facility] to see if they can coordinate scheduling echo and appointment at [facility].     Devices - CRT-D:   he  is not a candidate for cardiac resynchronization therapy.    No ICD indicated as EF >35-40%, therefore he is not a candidate for ICD  therapy.    Diuretics: as noted above    Exercise :  he  is a candidate for cardiac rehabilitation.  We discuss with him in the future.  He was given a significant pass for the time being.    Counseling:  -I have encouraged pt to continue to follow a <2gm/day Na restriction and emphasized limit fluid intake to < 2L/day.  -I have requested pt perform daily wts and contact our office for increase of >2-3lbs in 1 day or 5lbs in 1 week.   -I have encouraged pt to engage in aerobic activity as tolerated and avoid heavy weight lifting.   -Smoking/Alcohol: I have advised pt to abstain from any tobacco use and excessive alcohol intake.    -Weight: Obesity is a significant risk for both short and long-term morbidity and mortality for AHF patients.  I have stressed the importance of weight control and dietary management.        -Follow up: 3 to 4 weeks    Orders Placed This Encounter   Procedures    Basic Metabolic Panel     Standing Status:   Future     Standing Expiration Date:   11/28/2021     Order Specific Question:   Has the patient fasted?     Answer:   No     Order Specific Question:   Release to patient     Answer:   Immediate    i-Stat Chem 8 CartrIDge     Standing Status:   Standing     Number of Occurrences:   1    Echocardiogram Adult Complete W Clr/ Dopp Waveform     Standing Status:  Future     Standing Expiration Date:   12/28/2021     Scheduling Instructions:      Most recent echo July 2021; EF: 50- 55%.   He also needs follow up appointment at [facility]; if possible, please try to schedule echo and appointment same day.     Order Specific Question:   Which group should read the exam?     Answer:   [facility]     Order Specific Question:   Release to patient     Answer:   Immediate           Electronically signed by:      Merita Norton, NP  [facility]  [facility]  [facility]  [address]  [place], [place] [address]  [phone]   [phone] - fax             Note: This chart was generated by the [facility] EMR system/speech recognition and may contain inherit omission or errors not intended by the user.  Grammatical errors, random word insertions, deletions, pronoun errors and incomplete sentences are occasionally consequences of this technology due to software limitations.  Not all errors are caught or corrected.  If there are questions or concerns about the content of this note or information contained in the body of this dictation they should be addressed directly with the author for clarification.

## 2020-11-28 NOTE — Telephone Encounter (Signed)
-----   Message from Merita Norton, NP sent at 11/28/2020  3:42 PM EDT -----  Regarding: follow up appt. and echo  The patient's most recent echocardiogram was August 13, 2019; EF 50 to 55%; I have ordered a follow-up echocardiogram.  He has canceled his previous follow-up appointments at [facility] cardiology, and currently does not have one scheduled.  Can you please contact patient and attempt to schedule his echocardiogram and follow-up with cardiology on the same day?  Thank you so much, Windell Moulding

## 2020-11-28 NOTE — Patient Instructions (Addendum)
Based on weight gain and swelling, today in clinic, you were given Lasix (furosemide) 120 mg IV    Your potassium in the clinic was low normal; 3.8.  Today in clinic, you were given supplement of potassium chloride 60 mEq by mouth as a one-time dose    Starting tomorrow, resume taking Bumex 2 mg by mouth twice a day    And tomorrow, resume taking K-Dur 10 milliequivalents by mouth once a day    Please go to a lab in 1 week nonfasting lab work done to check a Chem-8; this will follow your kidney function, potassium level and electrolytes.  You do not need to fast.    A prescription for a new medication to start called Jardiance 10 mg by mouth once a day has been sent to your pharmacy.  This is a medication that helps with heart failure, it also improves your blood glucose.  If your insurance does not cover the Jardiance, then please call the office at [phone].    If you are going to have a procedure done where you are nothing by mouth, or be admitted to the hospital, stop the Jardiance 3 days prior to procedure or hospitalization.    Your most recent echocardiogram was July 2021.  I have placed an order for a follow-up echocardiogram, and I will send a message to [facility] cardiology to see if they can schedule the follow-up echo the same day as follow-up appointment with cardiology in their office.  That office should be calling you.    Please follow up with your primary care provider, and your Endocrinology office for blood glucose monitoring/results.   Your random blood glucose in the office today was 238.   That is elevated.  The goal for your blood glucose readings before meals is 90 - 130. The goal for your blood glucose readings one hour after meals is 100 - 180.  Please monitor/record your blood glucose at least 2 times a day and follow up with your PCP or Cira Servant, NP.    Please weigh yourself each morning. If you gain 2 pounds overnight or 5 pounds in one week, please call the office.   [phone].    Please follow up in 3-4 weeks; sooner if needed.    Please review the following:    Heart failure causes many symptoms.   Some of them are more serious than others.   It is important to recognize when you should call 911 for emergency help and when you should call your doctor or nurse for urgent attention.    The following lists can help you decide what to do when you experience certain symptoms. You may want to print this page and keep it in a convenient spot.    Emergency Symptoms of Heart Failure  Call 911 for emergency help, if you have:    -Chest discomfort or pain that lasts more than 15 minutes that is not relieved with rest or nitroglycerin.  -Severe, persistent shortness of breath.  -Fainted or passed out.    Urgent Symptoms of Heart Failure  Call your doctor immediately, if you have any of the following symptoms:    -Increasing shortness of breath or a new shortness of breath while resting.  -Trouble sleeping due to difficulty breathing. For example, waking up suddenly at night due to difficulty breathing.  -A need to sleep sitting up or on more pillows than usual.  -Fast or irregular heart beats, palpitations, or a racing heart that  persists and makes you feel dizzy or lightheaded.    Or if you:  -Cough up frothy or pink sputum.  -Feel like you may faint or pass out.    To manage your heart failure, please follow the following recommendations:    Fluid restriction:  You should follow a daily fluid restriction not to exceed 2 Liters daily (equivalent to 64 fluid ounces). This includes all things liquid at room temperature such as: all beverages, soup, ice cream, popsicles, jello and ice (you will need to guess how much the ice would be when melted and add toward your daily total). Keep track of your fluid intake throughout the day and measure everything.     Daily weights:  Weigh yourself every day when you wake up, right after using the bathroom. Keep a record of your daily weights and  call our office if you gain more than 2-3 pounds in 1 day or 5 pounds in less than a week.     Low sodium diet:  Sodium can lead to fluid retention and patients with heart failure should follow the low sodium diet, do not exceed 2000 mg sodium daily. Do not cook with salt or add it at the table. This includes table salt, sea salt, Kosher salt, garlic salt, seasoned salt or other seasoning's with salt (read the nutrition label). Avoid eating out at restaurants and high sodium foods such as processed meats, pre-packaged meals, pickles, soy sauce, chips, canned soups and bacon. Ask your healthcare provider for more information.    Activity:  We encourage you to engage in regular physical activity, such as daily walking. Start with short distances and walk at a comfortable pace. You should be able to "walk and talk". Gradually increase the distance and time that you walk. Exercise will help you feel better and can relieve the symptom of "heavy, tired legs". Stop exercise if you have chest pain, dizziness, or get so short of breath that you can not talk.   You should not lift/push/or pull heavy objects (anything heavy enough to make you strain).     Medications:  Take all your medications as prescribed by your health care provider and bring all your medication bottles to every appointment. It is important that you know what you are taking, when, and for what reason.    Medications you should not take:  Nonsteroidal anti-inflammatory drugs (NSAIDS): such as ibuprofen, Advil, Motrin, naprosyn, Naproxen, may cause fluid retention. You should not take these medications.    Call your healthcare provider if you are not feeling well or have new problems. We will call you back within 24 hours (Monday-Friday). In case of an emergency, call 911 or go to the nearest emergency department.      Thank you for choosing the [facility] at [facility] with [facility] for your care. It is important to  keep all your medical appointments, even if you feel well. To reschedule or cancel an appointment with our clinic, call [phone]. If you need to leave a message on the nurse line for the heart failure clinic, please call [phone].

## 2020-11-29 ENCOUNTER — Encounter: Payer: Self-pay | Admitting: Family Nurse Practitioner

## 2020-11-29 ENCOUNTER — Ambulatory Visit (HOSPITAL_BASED_OUTPATIENT_CLINIC_OR_DEPARTMENT_OTHER)
Admission: RE | Admit: 2020-11-29 | Discharge: 2020-11-29 | Disposition: A | Discharge status: Home / Self Care | Payer: 59 | Source: Ambulatory Visit | Admitting: Family Nurse Practitioner

## 2020-11-29 VITALS — BP 125/79 | HR 85 | Wt 348.6 lb

## 2020-11-29 DIAGNOSIS — I10 Essential (primary) hypertension: Secondary | ICD-10-CM

## 2020-11-29 DIAGNOSIS — R600 Localized edema: Secondary | ICD-10-CM

## 2020-11-29 DIAGNOSIS — I493 Ventricular premature depolarization: Secondary | ICD-10-CM

## 2020-11-29 DIAGNOSIS — I428 Other cardiomyopathies: Secondary | ICD-10-CM

## 2020-11-29 DIAGNOSIS — Z950 Presence of cardiac pacemaker: Secondary | ICD-10-CM

## 2020-11-29 DIAGNOSIS — I5020 Unspecified systolic (congestive) heart failure: Secondary | ICD-10-CM

## 2020-11-29 LAB — I-STAT CHEM 8 CARTRIDGE
Anion Gap I-Stat: 16 (ref 7.0–16.0)
BUN I-Stat: 35 mg/dL — ABNORMAL HIGH (ref 7–22)
Calcium Ionized I-Stat: 4.9 mg/dL (ref 4.35–5.10)
Chloride I-Stat: 101 mMol/L (ref 98–110)
Creatinine I-Stat: 1.8 mg/dL — ABNORMAL HIGH (ref 0.80–1.30)
EGFR: 45 mL/min/{1.73_m2} — ABNORMAL LOW (ref 60–150)
Glucose I-Stat: 189 mg/dL — ABNORMAL HIGH (ref 71–99)
Hematocrit I-Stat: 39 % (ref 39.0–52.5)
Hemoglobin I-Stat: 13.3 gm/dL (ref 13.0–17.5)
Potassium I-Stat: 4.1 mMol/L (ref 3.5–5.3)
Sodium I-Stat: 139 mMol/L (ref 136–147)
TCO2 I-Stat: 27 mMol/L (ref 24–29)

## 2020-11-29 MED ORDER — BUMETANIDE 0.25 MG/ML IJ SOLN
INTRAMUSCULAR | Status: AC
Start: 2020-11-29 — End: ?
  Filled 2020-11-29: qty 20

## 2020-11-29 MED ORDER — BUMETANIDE 2 MG PO TABS
ORAL_TABLET | ORAL | Status: DC
Start: 2020-11-29 — End: 2020-12-05

## 2020-11-29 MED ORDER — BUMETANIDE 0.25 MG/ML IJ SOLN
3.0000 mg | Freq: Once | INTRAMUSCULAR | Status: AC
Start: 2020-11-29 — End: 2020-11-29
  Administered 2020-11-29: 16:00:00 3 mg via INTRAVENOUS

## 2020-11-29 NOTE — Progress Notes (Signed)
See:  [facility]      follow-up Visit    Patient Name: Samuel Koch     Date of Birth: 02-18-1970    Provider: Elizbeth Squires, NP -C      Primary Cardiologist: Lovey Newcomer. [name], DO  Primary care provider:  Dr. [name]    Chief Complaint:    Follow-up   Subjective     HPI:  I had the pleasure of seeing [name] today at [facility] for an advanced heart failure visit.  Samuel Koch is a 50 y.o. male with nonischemic cardiomyopathy,  recovered EF 50-55% (08/13/2019); NYHA Class 3, ACC/AHA Stage C.  Samuel Koch also  has a past medical history of Cardiomyopathy, Congestive heart failure, Diabetes mellitus, Hypertension, and PVC's (premature ventricular contractions)..      From a cardiovascular standpoint, Samuel Koch states has not done well. He has gained about 12 lbs from his base weight. He was seen yesterday by Moses Manners, NP. He received IV lasix 120 mg and was advised to increased bumex to 4 mg bid and start jardiance. He has not picked up his new prescription. He took metolazone 5 mg this am with his bumex 4mg . He has only had minimal urine output. He only lost 2 lbs overnight.  Has a retreat next week and then will be flying out [place] on Sunday and will be there until after thanksgiving. He feels bloating and dyspnea.      He has had no hospital admissions or emergency room visits since last office visit on 08/17/2020.Samuel Koch reports that he has not been exercising recently, but three weeks ago, he was walking everyday.     He does not currently check blood pressures       He has been checking morning weights; his weight as noted above    Samuel Koch has been following a fluid restriction of 2000 mL per day better and has restricting dietary sodium content to no more than 2 gm/day.      He reports that he has been compliant with his medications.      Review of Systems:  Review of Systems   Constitutional:  Positive for  malaise/fatigue. Negative for chills, fever and weight loss.   Respiratory:  Positive for shortness of breath. Negative for cough.    Cardiovascular:  Positive for leg swelling. Negative for chest pain and palpitations.   Gastrointestinal:  Negative for abdominal pain, blood in stool, constipation, diarrhea, nausea and vomiting.   Genitourinary:  Negative for dysuria and hematuria.   Neurological:  Negative for dizziness.    Comprehensive 12 point review of system is negative unless described above in HPI    Review of Cardiovascular Testing:  EKG (07/19/2020):  sinus rhythm. Ventricular rate 87.     PMH - 06/23/2020 - CC/DG  LOV - 12/03/2019 - CC  LABS - 05/21/2020 - In Epic   LAST EKG - 01/12/2020    ED - 05/21/2020 - Acute gout    1. Chronic combined systolic and diastolic CHF   A. RHC 06/08/19- wedge pressure 16, RB pressure 33/7. Pulmonary artery pressure 30/12    2. Nonischemic cardiomyopathy   A. LHC (6/17): 30% mid RCA, no other significant disease.   B. Echo (6/17): EF 30-35%.   C. Holter (7/17): 19% PVCs.   D. Echo (3/18): EF 50-55%, moderate LVH, grade II diastolic dysfunction.   E. Echo (9/18): EF 50-55%.  F. Echo 10/07/17- LV cavity size is increased. The LVEF is est. at 50%. Mild hypokinesis of the distal septum and apex which may be due to paced rhythm. Pacer wire present in RV.   G. Echo 05/22/18- EF 50-55%. Grade II DD. Mitral valve leaflets mildly thickened without stenosis. Trace MR. Trace TR. Estimated pulmonary arterial pressure is 25-30 mmHg.   H. Echo 06/12/2018- Mild concentric LVH, EF 50-55%, aortic root is mildy dilated, aortic root is 3.9cm, tricuspid regurgitation is mild, RSVP .   I. Echo 08/13/19- EF 50-55%. Grade I diastolic dysfunction of the left ventricle (impaired relaxation pattern). The left ventricular ejection fraction is low normal, estimated at 50- 55%. RV mildly dilated in size with normal systolic function. A pacer wire is present in RV. No significant valvular heart disease or  evidence for pulmonary hypertension.     3. Acute Coronary Syndrome   A. Echo 03/27/2018- Technically difficult study with limited endocardial definition and structural resolution in the apical views, Intravenous contrast was administered to enhance endocardial definition, LV cavity size is norma, mild concentric left ventricular hypertrophy, LV systolic function is at the lower limits of normal, EF 50%, no regional wall motion abnormalities present, LV diastolic filling pattern is probably pseudonormalized, consistent with grade 2 left ventricular diastolic dysfunction and elevated left atrial pressure, Right-sided chambers are not optimally visualized but appear grossly normal in size, Pacemaker lead is visualized within the right heart chamber, no significant valvular heart disease.   B. Stress 03/28/2018- Completed vasodilator protocol., No ischemic changes with infusion, No arrhythmias, Overall, low risk vasodilator stress rest SPECT perfusion study, a large moderate to severe perfusion defect involving the apex, inferior stripe, mid and basal inferoseptal and inferolateral segments with mild reversibility suggestive of possible ischemia but with likely strong component of diaphragmatic attenuation artifact, Also anteroseptal perfusion defect from attenuation artifact, No significant findings of ischemia Noted, Dilated LV with mildly decreased left ventricular function, stress EF 44%.   C. THC 03/30/2018- Negative heart cath. False positive stress test.     4. Bradycardia/ SSS   A. 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 [facility])     5. PVCs   A. 48 hour holter (7/17) with 19% PVCs.   B. PVC ablation in 8/17 but PVCs recurred.   C. Holter (11/17) with rare PVCs, < 1%.   D. Holter (9/18) with rare PVCs only. 6. NSVT   7. Type II diabetes   8. Hypertriglyceridemia       Past Surgical History:   has a past surgical history that includes APPENDECTOMY (OPEN); Cardiac pacemaker placement;  Right & Left Heart Cath  w/ Coronary Angios, LV/LA (Right, 03/30/2018); and Right Heart Cath (Right, 06/08/2019).    Family History:  family history includes Diabetes in his brother and paternal grandmother; Heart attack in his sister; Heart disease in his brother, father, and sister; Hypertension in his father; No known problems in his mother.  Married    Social History:   reports that he has quit smoking. He has quit using smokeless tobacco. He reports that he does not drink alcohol and does not use drugs.    Allergies:  has No Known Allergies.    Medications:    Current Outpatient Medications:     Blood Glucose Monitor, generic, Kit, Use as directed to monitor blood sugar, Disp: 1 kit, Rfl: 1    Blood Glucose Monitoring Suppl (OneTouch Ultra 2) w/Device Kit, USE AS DIRECTED TO MONITOR BLOOD SUGAR, Disp: ,  Rfl:     bumetanide (BUMEX) 2 MG tablet, Take 1 tablets (2 mg total) by mouth every morning AND 2 tablets (4 mg total) Daily after lunch. (Patient taking differently: 2 mg Take 2 tablets (4 mg total) by mouth every morning AND 2 tablets (4 mg total) Daily after lunch.), Disp: 270 tablet, Rfl: 3    Cholecalciferol (vitamin D3) 25 MCG (1000 UT) Cap, Take 1,000 Units by mouth daily, Disp: , Rfl:     Continuous Blood Gluc Sensor (FreeStyle Libre 2 Sensor Systm) Misc, 1 Device by Does not apply route every 14 (fourteen) days, Disp: 2 each, Rfl: 12    glucose blood test strip, Check BS 3 times daily, Disp: 270 each, Rfl: 3    insulin glargine (LANTUS SOLOSTAR) 100 UNIT/ML injection pen, 36 units twice a day, Disp: 75 mL, Rfl: 3    insulin lispro, 1 Unit Dial, (HumaLOG) 100 UNIT/ML Solution Pen-injector injection pen, INJECT 14 UNITS PLUS SLIDING SCALE 2:50>150 THREE TIMES DAILY BEFORE MEALS. Max 75 U daily., Disp: 75 mL, Rfl: 3    Insulin Pen Needle (Pen Needles 3/16") 31G X 5 MM Misc, 1 each 5 times per day, Disp: 450 each, Rfl: 3    Insulin Syringes, Disposable, U-100 1 ML Misc, 1 each by Does not apply route 2 (two)  times daily, Disp: 180 each, Rfl: 0    Lancets Ultra Fine Misc, Check BS 5 times daily, Disp: 150 each, Rfl: 11    lisinopril (ZESTRIL) 5 MG tablet, Take 1 tablet (5 mg total) by mouth daily, Disp: 90 tablet, Rfl: 3    metFORMIN (GLUCOPHAGE) 500 MG tablet, 2 tablets (1000 mg) with breakfast and 1 tablets (500 mg) with dinner (Patient taking differently: 2 tablets (1000 mg) with breakfast and 1 tablets (500 mg) with dinner 1500 total), Disp: 90 tablet, Rfl: 0    metOLazone (ZAROXOLYN) 5 MG tablet, 1 tablet daily half hour prior to a.m. Bumex (Patient taking differently: 1 tablet daily half hour prior to a.m. Bumex PRN.), Disp: 30 tablet, Rfl: 11    potassium chloride (K-DUR) 10 MEQ tablet, Take 1 tablet (10 mEq total) by mouth daily, Disp: 90 tablet, Rfl: 3    Semaglutide, 1 MG/DOSE, (Ozempic, 1 MG/DOSE,) 2 MG/1.5ML Solution Pen-injector, Inject 1 mg into the skin once a week, Disp: 9 mL, Rfl: 3    spironolactone (ALDACTONE) 25 MG tablet, Take 1 tablet (25 mg total) by mouth daily, Disp: 90 tablet, Rfl: 3    empagliflozin (Jardiance) 10 MG tablet, Take 1 tablet (10 mg total) by mouth every morning (Patient not taking: Reported on 11/29/2020), Disp: 30 tablet, Rfl: 2  No current facility-administered medications for this encounter.       Objective     Visit Vitals  BP 125/79   Pulse 85   Wt 158.1 kg (348 lb 9.6 oz)   SpO2 96%   BMI 44.76 kg/m       Wt Readings from Last 7 Encounters:   11/29/20 158.1 kg (348 lb 9.6 oz)   11/28/20 158.3 kg (348 lb 15.8 oz)   08/17/20 149.7 kg (330 lb 2 oz)   07/26/20 152.9 kg (337 lb)   07/19/20 152 kg (335 lb)   05/15/20 152 kg (335 lb)   03/08/20 151 kg (333 lb)       Physical Exam:   General: well-developed male, alert, NAD  HEENT:  Anicteric sclera  Neck:  no JVD  Lungs: bilateral breath sounds clear; no rhonchi, wheezes  or crackles with normal respiratory effort  Cardiovascular: s1s2, normal rate, no m/r/g  Abdominal: protuberant; soft; non-tender  Extremities: skin warm and dry;  +1 edema both lower legs/feet  Neuromuscular exam: grossly non-focal.  Skin: no rash or ulceration.      Laboratory Data:  Lab Results   Component Value Date/Time    NA 142 05/21/2020 08:27 PM    K 3.8 05/21/2020 08:27 PM    CL 108 05/21/2020 08:27 PM    CO2 21 05/21/2020 08:27 PM    CA 9.9 05/21/2020 08:27 PM    GLU 115 (H) 05/21/2020 08:27 PM    CREAT 1.20 11/28/2020 03:07 PM    CREAT 1.13 05/21/2020 08:27 PM    BUN 19 05/21/2020 08:27 PM    PROT 7.1 12/26/2018 06:20 PM    ALB 4.2 12/26/2018 06:20 PM    ALKPHOS 67 12/26/2018 06:20 PM    ALT 24 12/26/2018 06:20 PM    AST 15 12/26/2018 06:20 PM    BILITOTAL 0.3 12/26/2018 06:20 PM    AGRATIO 1.45 12/26/2018 06:20 PM    ANIONGAP 16.8 05/21/2020 08:27 PM    BUNCRRATIO 16.8 05/21/2020 08:27 PM    EGFR 73 11/28/2020 03:07 PM    OSMO 286 05/21/2020 08:27 PM    GLOB 2.9 12/26/2018 06:20 PM         Additional Non-ischemic w/u panel:  Lab Results   Component Value Date/Time    TSH 0.98 03/08/2020 10:59 AM    T4FREE Not Indicated 03/08/2020 10:59 AM       No results found for: HIVAGAB    No results found for: HCVAB, HBV, HEPBCOREAB     Lab Results   Component Value Date/Time    WBC 8.9 05/21/2020 08:27 PM    RBC 4.34 05/21/2020 08:27 PM    HGB 13.4 05/21/2020 08:27 PM    HCT 39.8 05/21/2020 08:27 PM    PLT 307 05/21/2020 08:27 PM     Lab Results   Component Value Date/Time    CHOL 206 (H) 05/01/2020 08:17 AM    TRIG 574 (HH) 05/01/2020 08:17 AM    TRIG 228 (H) 03/26/2018 04:24 PM    HDL 36 (L) 05/01/2020 08:17 AM    LDL 79 05/01/2020 08:17 AM     Lab Results   Component Value Date/Time    HGBA1CPERCNT 8.2 03/26/2018 04:24 PM     Lab Results   Component Value Date/Time    BNP 10.2 05/21/2020 08:27 PM              Today's I-stat Labs:  Results       Procedure Component Value Units Date/Time    i-Stat Chem 8 CartrIDge [[id number]]  (Abnormal) Collected: 11/29/20 1527    Specimen: Blood Updated: 11/29/20 1530     Sodium I-Stat 139 mMol/L      Potassium I-Stat 4.1 mMol/L       Chloride I-Stat 101 mMol/L      TCO2 I-Stat 27 mMol/L      Calcium Ionized I-Stat 4.90 mg/dL      Glucose I-Stat 409 mg/dL      Creatinine I-Stat 1.80 mg/dL      BUN I-Stat 35 mg/dL      Anion Gap I-Stat 81.1     EGFR 45 mL/min/1.53m2      Hematocrit I-Stat 39.0 %      Hemoglobin I-Stat 13.3 gm/dL  Assessment and Plan:  1. HFrEF (heart failure with reduced ejection fraction)    2. NICM (nonischemic cardiomyopathy)    3. PVC (premature ventricular contraction)    4. S/P placement of cardiac pacemaker    5. Essential hypertension    6. Bilateral leg edema           My impression is that, Samuel Koch has a stage C, nonischemic cardiomyopathy and currently reports NYHA Class III/IV level of symptoms.      he  is not well compensated and well perfused on exam. he does not have evidence of end-organ hypoperfusion based on labs and clinical exam.     We will be monitoring closely for changes in heart failure symptoms, electrolyte abnormalities and arrhythmias.    In regards to OMM:  Beta-blocker: no -does not tolerate  ACE-I/ARB/ARNI:yes; Lisinopril 5 mg daily  Aldosterone antagonist:yes; Aldosterone 25 mg once a day  Hydralazine/nitrate: no   Digoxin: no    SGLT2i:  no;   recommend picking up and starting Jardiance 10 mg daily    Med Changes and plan:  received Lasix 120 mg IV in clinic yesterday  Moderate rise in crt today  Provided IV bumex 3 mg in clinic  Patient was stable after administration of medication and iv was removed. Patient was discharged.   Continue bumex  Avoid metolazone  Start jardiance  Return next week.     Keep follow up with cardiology for echo and divice check.     Devices - CRT-D:   he  is not a candidate for cardiac resynchronization therapy.    No ICD indicated as EF >35-40%, therefore he is not a candidate for ICD therapy.    Has ppm in place followed by [facility]    Diuretics: as noted above    Exercise :  he  is a candidate for cardiac rehabilitation.  We  discuss with him in the future.  He was given a significant pass for the time being.    Counseling:  -I have encouraged pt to continue to follow a <2gm/day Na restriction and emphasized limit fluid intake to < 2L/day.  -I have requested pt perform daily wts and contact our office for increase of >2-3lbs in 1 day or 5lbs in 1 week.   -I have encouraged pt to engage in aerobic activity as tolerated and avoid heavy weight lifting.   -Smoking/Alcohol: I have advised pt to abstain from any tobacco use and excessive alcohol intake.    -Weight: Obesity is a significant risk for both short and long-term morbidity and mortality for AHF patients.  I have stressed the importance of weight control and dietary management.        Our office is available to answer any questions you or the patient may have. We appreciate the opportunity to participate in the care of your patients.    Electronically signed by:    Deatra James. Georg Ruddle, FNP-C  Clinical Coordinator [facility]  [facility]  [facility]  [address]  [place], [place] [address]  [phone]   [phone] - fax         Note: This chart was generated by the [facility] EMR system/speech recognition and may contain inherit omission or errors not intended by the user.  Grammatical errors, random word insertions, deletions, pronoun errors and incomplete sentences are occasionally consequences of this technology due to software limitations.  Not all errors are caught  or corrected.  If there are questions or concerns about the content of this note or information contained in the body of this dictation they should be addressed directly with the author for clarification.

## 2020-11-29 NOTE — Patient Instructions (Signed)
Continue bumex 2 mg 2 tablets (4 mg) in am and 2 tablets after lunch (4 mg)    Start jardiance 10 mg every day

## 2020-12-05 ENCOUNTER — Encounter: Payer: Self-pay | Admitting: Family Nurse Practitioner

## 2020-12-05 ENCOUNTER — Ambulatory Visit
Admission: RE | Admit: 2020-12-05 | Discharge: 2020-12-05 | Disposition: A | Discharge status: Home / Self Care | Payer: 59 | Source: Ambulatory Visit | Attending: Family | Admitting: Family

## 2020-12-05 VITALS — BP 126/88 | HR 93 | Wt 338.1 lb

## 2020-12-05 DIAGNOSIS — I428 Other cardiomyopathies: Secondary | ICD-10-CM

## 2020-12-05 DIAGNOSIS — I5032 Chronic diastolic (congestive) heart failure: Secondary | ICD-10-CM | POA: Insufficient documentation

## 2020-12-05 DIAGNOSIS — Z950 Presence of cardiac pacemaker: Secondary | ICD-10-CM

## 2020-12-05 DIAGNOSIS — I5020 Unspecified systolic (congestive) heart failure: Secondary | ICD-10-CM

## 2020-12-05 DIAGNOSIS — I493 Ventricular premature depolarization: Secondary | ICD-10-CM

## 2020-12-05 DIAGNOSIS — I10 Essential (primary) hypertension: Secondary | ICD-10-CM

## 2020-12-05 DIAGNOSIS — I110 Hypertensive heart disease with heart failure: Secondary | ICD-10-CM | POA: Insufficient documentation

## 2020-12-05 LAB — I-STAT CHEM 8 CARTRIDGE
Anion Gap I-Stat: 15 (ref 7.0–16.0)
BUN I-Stat: 36 mg/dL — ABNORMAL HIGH (ref 7–22)
Calcium Ionized I-Stat: 4.8 mg/dL (ref 4.35–5.10)
Chloride I-Stat: 100 mMol/L (ref 98–110)
Creatinine I-Stat: 1.6 mg/dL — ABNORMAL HIGH (ref 0.80–1.30)
EGFR: 52 mL/min/{1.73_m2} — ABNORMAL LOW (ref 60–150)
Glucose I-Stat: 97 mg/dL (ref 71–99)
Hematocrit I-Stat: 41 % (ref 39.0–52.5)
Hemoglobin I-Stat: 13.9 gm/dL (ref 13.0–17.5)
Potassium I-Stat: 3.8 mMol/L (ref 3.5–5.3)
Sodium I-Stat: 141 mMol/L (ref 136–147)
TCO2 I-Stat: 30 mMol/L — ABNORMAL HIGH (ref 24–29)

## 2020-12-05 MED ORDER — BUMETANIDE 2 MG PO TABS
ORAL_TABLET | ORAL | Status: DC
Start: 2020-12-05 — End: 2020-12-29

## 2020-12-05 NOTE — Progress Notes (Signed)
See:  [facility]      Established Visit    Patient Name: Samuel Koch     Date of Birth: 02-03-1971    Provider: Elizbeth Squires, NP -C      Primary Cardiologist: Lovey Newcomer. [name], DO  Primary care provider:  Dr. [name]    Chief Complaint:    Follow-up   Subjective     HPI:  I had the pleasure of seeing [name] today at [facility] for an advanced heart failure visit.  Samuel Koch is a 50 y.o. male with nonischemic cardiomyopathy,  recovered EF 50-55% (08/13/2019); NYHA Class 3, ACC/AHA Stage C.  Samuel Koch also  has a past medical history of Cardiomyopathy, Congestive heart failure, Diabetes mellitus, Hypertension, and PVC's (premature ventricular contractions)..      From a cardiovascular standpoint, Mr. Plush states has done better.  He was seen in our office two times last week due to weight gain of about 12 lbs from his base weight. He received IV lasix 120 mg. He did not have significant urine output.  Then received IV bumex 3 mg in clinic. He notes he had significant weight loss after the IV Bumex.  His weight is now down 12 pounds at home.     His Bumex was increased to 4 mg twice a day and he was directed to start Jardiance.  He feels better this week. Has a retreat next week and then will be flying out [place] on Sunday and will be there until after thanksgiving. He feels bloating and dyspnea.      He has had no hospital admissions or emergency room visits since last office visit on 08/17/2020.Vassie Loll reports that he has not been exercising recently, but three weeks ago, he was walking everyday.     He does not currently check blood pressures       He has been checking morning weights; his weight as noted above    Samuel Koch has been following a fluid restriction of 2000 mL per day better and has restricting dietary sodium content to no more than 2 gm/day.      He reports that he has been compliant with his medications.       Review of Systems:  Review of Systems   Constitutional:  Positive for malaise/fatigue. Negative for chills, fever and weight loss.   Respiratory:  Positive for shortness of breath. Negative for cough.    Cardiovascular:  Positive for leg swelling. Negative for chest pain and palpitations.   Gastrointestinal:  Negative for abdominal pain, blood in stool, constipation, diarrhea, nausea and vomiting.   Genitourinary:  Negative for dysuria and hematuria.   Neurological:  Negative for dizziness.    Comprehensive 12 point review of system is negative unless described above in HPI    Review of Cardiovascular Testing:  EKG (07/19/2020):  sinus rhythm. Ventricular rate 87.     PMH - 06/23/2020 - CC/DG  LOV - 12/03/2019 - CC  LABS - 05/21/2020 - In Epic   LAST EKG - 01/12/2020    ED - 05/21/2020 - Acute gout    1. Chronic combined systolic and diastolic CHF   A. RHC 06/08/19- wedge pressure 16, RB pressure 33/7. Pulmonary artery pressure 30/12    2. Nonischemic cardiomyopathy   A. LHC (6/17): 30% mid RCA, no other significant disease.   B. Echo (6/17): EF 30-35%.   C. Holter (7/17):  19% PVCs.   D. Echo (3/18): EF 50-55%, moderate LVH, grade II diastolic dysfunction.   E. Echo (9/18): EF 50-55%.   F. Echo 10/07/17- LV cavity size is increased. The LVEF is est. at 50%. Mild hypokinesis of the distal septum and apex which may be due to paced rhythm. Pacer wire present in RV.   G. Echo 05/22/18- EF 50-55%. Grade II DD. Mitral valve leaflets mildly thickened without stenosis. Trace MR. Trace TR. Estimated pulmonary arterial pressure is 25-30 mmHg.   H. Echo 06/12/2018- Mild concentric LVH, EF 50-55%, aortic root is mildy dilated, aortic root is 3.9cm, tricuspid regurgitation is mild, RSVP .   I. Echo 08/13/19- EF 50-55%. Grade I diastolic dysfunction of the left ventricle (impaired relaxation pattern). The left ventricular ejection fraction is low normal, estimated at 50- 55%. RV mildly dilated in size with normal systolic  function. A pacer wire is present in RV. No significant valvular heart disease or evidence for pulmonary hypertension.     3. Acute Coronary Syndrome   A. Echo 03/27/2018- Technically difficult study with limited endocardial definition and structural resolution in the apical views, Intravenous contrast was administered to enhance endocardial definition, LV cavity size is norma, mild concentric left ventricular hypertrophy, LV systolic function is at the lower limits of normal, EF 50%, no regional wall motion abnormalities present, LV diastolic filling pattern is probably pseudonormalized, consistent with grade 2 left ventricular diastolic dysfunction and elevated left atrial pressure, Right-sided chambers are not optimally visualized but appear grossly normal in size, Pacemaker lead is visualized within the right heart chamber, no significant valvular heart disease.   B. Stress 03/28/2018- Completed vasodilator protocol., No ischemic changes with infusion, No arrhythmias, Overall, low risk vasodilator stress rest SPECT perfusion study, a large moderate to severe perfusion defect involving the apex, inferior stripe, mid and basal inferoseptal and inferolateral segments with mild reversibility suggestive of possible ischemia but with likely strong component of diaphragmatic attenuation artifact, Also anteroseptal perfusion defect from attenuation artifact, No significant findings of ischemia Noted, Dilated LV with mildly decreased left ventricular function, stress EF 44%.   C. THC 03/30/2018- Negative heart cath. False positive stress test.     4. Bradycardia/ SSS   A. 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 [facility])     5. PVCs   A. 48 hour holter (7/17) with 19% PVCs.   B. PVC ablation in 8/17 but PVCs recurred.   C. Holter (11/17) with rare PVCs, < 1%.   D. Holter (9/18) with rare PVCs only. 6. NSVT   7. Type II diabetes   8. Hypertriglyceridemia       Past Surgical History:   has a  past surgical history that includes APPENDECTOMY (OPEN); Cardiac pacemaker placement; Right & Left Heart Cath  w/ Coronary Angios, LV/LA (Right, 03/30/2018); and Right Heart Cath (Right, 06/08/2019).    Family History:  family history includes Diabetes in his brother and paternal grandmother; Heart attack in his sister; Heart disease in his brother, father, and sister; Hypertension in his father; No known problems in his mother.  Married    Social History:   reports that he has quit smoking. He has quit using smokeless tobacco. He reports that he does not drink alcohol and does not use drugs.    Allergies:  has No Known Allergies.    Medications:    Current Outpatient Medications:     Blood Glucose Monitor, generic, Kit, Use as directed to monitor blood sugar, Disp:  1 kit, Rfl: 1    Blood Glucose Monitoring Suppl (OneTouch Ultra 2) w/Device Kit, USE AS DIRECTED TO MONITOR BLOOD SUGAR, Disp: , Rfl:     bumetanide (BUMEX) 2 MG tablet, Take 2 tablets (4 mg total) by mouth every morning AND 2 tablets (4 mg total) Daily after lunch., Disp: , Rfl:     Cholecalciferol (vitamin D3) 25 MCG (1000 UT) Cap, Take 1,000 Units by mouth daily, Disp: , Rfl:     Continuous Blood Gluc Sensor (FreeStyle Libre 2 Sensor Systm) Misc, 1 Device by Does not apply route every 14 (fourteen) days, Disp: 2 each, Rfl: 12    empagliflozin (Jardiance) 10 MG tablet, Take 1 tablet (10 mg total) by mouth every morning, Disp: 30 tablet, Rfl: 2    glucose blood test strip, Check BS 3 times daily, Disp: 270 each, Rfl: 3    insulin glargine (LANTUS SOLOSTAR) 100 UNIT/ML injection pen, 36 units twice a day, Disp: 75 mL, Rfl: 3    insulin lispro, 1 Unit Dial, (HumaLOG) 100 UNIT/ML Solution Pen-injector injection pen, INJECT 14 UNITS PLUS SLIDING SCALE 2:50>150 THREE TIMES DAILY BEFORE MEALS. Max 75 U daily., Disp: 75 mL, Rfl: 3    Insulin Pen Needle (Pen Needles 3/16") 31G X 5 MM Misc, 1 each 5 times per day, Disp: 450 each, Rfl: 3    Insulin Syringes,  Disposable, U-100 1 ML Misc, 1 each by Does not apply route 2 (two) times daily, Disp: 180 each, Rfl: 0    Lancets Ultra Fine Misc, Check BS 5 times daily, Disp: 150 each, Rfl: 11    lisinopril (ZESTRIL) 5 MG tablet, Take 1 tablet (5 mg total) by mouth daily, Disp: 90 tablet, Rfl: 3    metFORMIN (GLUCOPHAGE) 500 MG tablet, 2 tablets (1000 mg) with breakfast and 1 tablets (500 mg) with dinner (Patient taking differently: 2 tablets (1000 mg) with breakfast and 1 tablets (500 mg) with dinner 1500 total), Disp: 90 tablet, Rfl: 0    metOLazone (ZAROXOLYN) 5 MG tablet, 1 tablet daily half hour prior to a.m. Bumex (Patient taking differently: 1 tablet daily half hour prior to a.m. Bumex PRN.), Disp: 30 tablet, Rfl: 11    potassium chloride (K-DUR) 10 MEQ tablet, Take 1 tablet (10 mEq total) by mouth daily, Disp: 90 tablet, Rfl: 3    Semaglutide, 1 MG/DOSE, (Ozempic, 1 MG/DOSE,) 2 MG/1.5ML Solution Pen-injector, Inject 1 mg into the skin once a week, Disp: 9 mL, Rfl: 3    spironolactone (ALDACTONE) 25 MG tablet, Take 1 tablet (25 mg total) by mouth daily, Disp: 90 tablet, Rfl: 3       Objective     Visit Vitals  BP 126/88   Pulse 93   Wt 153.4 kg (338 lb 2 oz)   SpO2 94%   BMI 43.41 kg/m       Wt Readings from Last 7 Encounters:   12/05/20 153.4 kg (338 lb 2 oz)   11/29/20 158.1 kg (348 lb 9.6 oz)   11/28/20 158.3 kg (348 lb 15.8 oz)   08/17/20 149.7 kg (330 lb 2 oz)   07/26/20 152.9 kg (337 lb)   07/19/20 152 kg (335 lb)   05/15/20 152 kg (335 lb)       Physical Exam:   General: well-developed male, alert, NAD  HEENT:  Anicteric sclera  Neck:  no JVD  Lungs: bilateral breath sounds clear; no rhonchi, wheezes or crackles with normal respiratory effort  Cardiovascular: s1s2, normal rate, no  m/r/g  Abdominal: protuberant; soft; non-tender  Extremities: skin warm and dry; trace edema both lower legs/feet  Neuromuscular exam: grossly non-focal.  Skin: no rash or ulceration.      Laboratory Data:  Lab Results   Component Value  Date/Time    NA 142 05/21/2020 08:27 PM    K 3.8 05/21/2020 08:27 PM    CL 108 05/21/2020 08:27 PM    CO2 21 05/21/2020 08:27 PM    CA 9.9 05/21/2020 08:27 PM    GLU 115 (H) 05/21/2020 08:27 PM    CREAT 1.60 (H) 12/05/2020 03:33 PM    CREAT 1.13 05/21/2020 08:27 PM    BUN 19 05/21/2020 08:27 PM    PROT 7.1 12/26/2018 06:20 PM    ALB 4.2 12/26/2018 06:20 PM    ALKPHOS 67 12/26/2018 06:20 PM    ALT 24 12/26/2018 06:20 PM    AST 15 12/26/2018 06:20 PM    BILITOTAL 0.3 12/26/2018 06:20 PM    AGRATIO 1.45 12/26/2018 06:20 PM    ANIONGAP 16.8 05/21/2020 08:27 PM    BUNCRRATIO 16.8 05/21/2020 08:27 PM    EGFR 52 (L) 12/05/2020 03:33 PM    OSMO 286 05/21/2020 08:27 PM    GLOB 2.9 12/26/2018 06:20 PM         Additional Non-ischemic w/u panel:  Lab Results   Component Value Date/Time    TSH 0.98 03/08/2020 10:59 AM    T4FREE Not Indicated 03/08/2020 10:59 AM       No results found for: HIVAGAB    No results found for: HCVAB, HBV, HEPBCOREAB     Lab Results   Component Value Date/Time    WBC 8.9 05/21/2020 08:27 PM    RBC 4.34 05/21/2020 08:27 PM    HGB 13.4 05/21/2020 08:27 PM    HCT 39.8 05/21/2020 08:27 PM    PLT 307 05/21/2020 08:27 PM     Lab Results   Component Value Date/Time    CHOL 206 (H) 05/01/2020 08:17 AM    TRIG 574 (HH) 05/01/2020 08:17 AM    TRIG 228 (H) 03/26/2018 04:24 PM    HDL 36 (L) 05/01/2020 08:17 AM    LDL 79 05/01/2020 08:17 AM     Lab Results   Component Value Date/Time    HGBA1CPERCNT 8.2 03/26/2018 04:24 PM     Lab Results   Component Value Date/Time    BNP 10.2 05/21/2020 08:27 PM              Today's I-stat Labs:  Results       Procedure Component Value Units Date/Time    i-Stat Chem 8 CartrIDge [[id number]]  (Abnormal) Collected: 12/05/20 1533    Specimen: Blood Updated: 12/05/20 1538     Sodium I-Stat 141 mMol/L      Potassium I-Stat 3.8 mMol/L      Chloride I-Stat 100 mMol/L      TCO2 I-Stat 30 mMol/L      Calcium Ionized I-Stat 4.80 mg/dL      Glucose I-Stat 97 mg/dL      Creatinine I-Stat 1.60  mg/dL      BUN I-Stat 36 mg/dL      Anion Gap I-Stat 40.9     EGFR 52 mL/min/1.12m2      Hematocrit I-Stat 41.0 %      Hemoglobin I-Stat 13.9 gm/dL  Assessment and Plan:  1. HFrEF (heart failure with reduced ejection fraction)    2. NICM (nonischemic cardiomyopathy)    3. PVC (premature ventricular contraction)    4. S/P placement of cardiac pacemaker    5. Essential hypertension           My impression is that, Mr. Klasen has a stage C, nonischemic cardiomyopathy and currently reports NYHA Class III level of symptoms.      he  is not well compensated and well perfused on exam. he does not have evidence of end-organ hypoperfusion based on labs and clinical exam.     We will be monitoring closely for changes in heart failure symptoms, electrolyte abnormalities and arrhythmias.    In regards to OMM:  Beta-blocker: no -does not tolerate  ACE-I/ARB/ARNI:yes; Lisinopril 5 mg daily  Aldosterone antagonist:yes; Aldosterone 25 mg once a day  Hydralazine/nitrate: no   Digoxin: no    SGLT2i:  no;   recommend picking up and starting Jardiance 10 mg daily    Med Changes and plan:  Decrease bumex 2 mg - 2 tablets (4 mg) in am and 1 tablet (2 mg) in afternoon  Continue jardiance  Avoid metolazone  Return after trip to [place]     Keep follow up with cardiology for echo and divice check.     Devices - CRT-D:   he  is not a candidate for cardiac resynchronization therapy.    No ICD indicated as EF >35-40%, therefore he is not a candidate for ICD therapy.    Has ppm in place followed by [facility]    Diuretics: as noted above    Exercise :  he  is a candidate for cardiac rehabilitation.  We discuss with him in the future.  He was given a significant pass for the time being.    Counseling:  -I have encouraged pt to continue to follow a <2gm/day Na restriction and emphasized limit fluid intake to < 2L/day.  -I have requested pt perform daily wts and contact our office for increase of >2-3lbs in 1  day or 5lbs in 1 week.   -I have encouraged pt to engage in aerobic activity as tolerated and avoid heavy weight lifting.   -Smoking/Alcohol: I have advised pt to abstain from any tobacco use and excessive alcohol intake.    -Weight: Obesity is a significant risk for both short and long-term morbidity and mortality for AHF patients.  I have stressed the importance of weight control and dietary management.        Our office is available to answer any questions you or the patient may have. We appreciate the opportunity to participate in the care of your patients.    Electronically signed by:    Deatra James. Georg Ruddle, FNP-C  Clinical Coordinator [facility]  [facility]  [facility]  [address]  [place], [place] [address]  [phone]   [phone] - fax         Note: This chart was generated by the [facility] EMR system/speech recognition and may contain inherit omission or errors not intended by the user.  Grammatical errors, random word insertions, deletions, pronoun errors and incomplete sentences are occasionally consequences of this technology due to software limitations.  Not all errors are caught or corrected.  If there are questions or concerns about the content of this note or information contained in the body of this dictation they should be addressed  directly with the author for clarification.

## 2020-12-05 NOTE — Patient Instructions (Addendum)
Decrease bumex 2 mg 2 tablets in am and 1 tablet in afternoon. If you gain 2 lbs overnight or 5 lbs in 1 week, please take an extra 2 mg in afternoon as needed.

## 2020-12-27 ENCOUNTER — Telehealth: Payer: Self-pay

## 2020-12-27 NOTE — Telephone Encounter (Signed)
Requested records from pcp

## 2020-12-28 NOTE — Progress Notes (Signed)
See:  [facility]      Established Visit    Patient Name: Samuel Koch     Date of Birth: Jul 02, 1970    Provider: Merita Norton, NP -C      Primary Cardiologist: Lovey Newcomer. Samuel Hollingshead, DO  Primary care provider:  Dr. Lauro Koch    Chief Complaint:    Follow-up   Subjective     HPI:  I had the pleasure of seeing Samuel Koch today at [facility] for an advanced heart failure visit.  Samuel Koch is a 50 y.o. male with nonischemic cardiomyopathy,  recovered EF 50-55% (08/13/2019); NYHA Class 3, ACC/AHA Stage C.  Samuel Nettle Sahr also  has a past medical history of Congestive heart failure, Type 2 Diabetes mellitus, Hypertension, and PVC's (premature ventricular contractions)..      From a cardiovascular standpoint, Mr. Herren reports he is doing better.      He recently traveled to [place], and returned on 12/21/20.     Previously seen in the office here on 12/05/20.  Since then, he has continued to take Bumex 4 mg twice a day, except he went two days without Bumex, due to insurance not covering for him to pick up Rx refill; he was able to obtain/resume Bumex on 12/26/20.  He has not used any PRN Bumex, and has not taken any PRN Metolazone.   His exertional SOB is at baseline.  Denies chest pain.  Mild dizziness with position change; not new  Has palpitations with activity  Has not checked B/P lately   Home weight 330 lbs this morning  Will be traveling again in February 2023.     [name] reports that he has not been exercising recently  He does not currently check blood pressures     He has been checking morning weights; his weight has been stable.  Zyir has been following a fluid restriction of 2000 mL per day better and has restricting dietary sodium content to no more than 2 gm/day.    He reports that he has been compliant with his medications.      Review of Systems:  Review of Systems   Constitutional:  Positive for malaise/fatigue. Negative for  chills, fever and weight loss.   Respiratory:  Positive for shortness of breath. Negative for cough.    Cardiovascular:  Positive for leg swelling. Negative for chest pain and palpitations.   Gastrointestinal:  Negative for abdominal pain, blood in stool, constipation, diarrhea, nausea and vomiting.   Genitourinary:  Negative for dysuria and hematuria.   Neurological:  Positive for dizziness.    Comprehensive 12 point review of system is negative unless described above in HPI    Review of Cardiovascular Testing:  EKG (07/19/2020):  sinus rhythm. Ventricular rate 87.     PMH:  1. Chronic combined systolic and diastolic CHF   A. RHC 06/08/19- wedge pressure 16, RB pressure 33/7. Pulmonary artery pressure 30/12    2. Nonischemic cardiomyopathy   A. LHC (6/17): 30% mid RCA, no other significant disease.   B. Echo (6/17): EF 30-35%.   C. Holter (7/17): 19% PVCs.   D. Echo (3/18): EF 50-55%, moderate LVH, grade II diastolic dysfunction.   E. Echo (9/18): EF 50-55%.   F. Echo 10/07/17- LV cavity size is increased. The LVEF is est. at 50%. Mild hypokinesis of the distal septum and apex which may be due to paced rhythm. Pacer wire present in RV.  G. Echo 05/22/18- EF 50-55%. Grade II DD. Mitral valve leaflets mildly thickened without stenosis. Trace MR. Trace TR. Estimated pulmonary arterial pressure is 25-30 mmHg.   H. Echo 06/12/2018- Mild concentric LVH, EF 50-55%, aortic root is mildy dilated, aortic root is 3.9cm, tricuspid regurgitation is mild, RSVP .   I. Echo 08/13/19- EF 50-55%. Grade I diastolic dysfunction of the left ventricle (impaired relaxation pattern). The left ventricular ejection fraction is low normal, estimated at 50- 55%. RV mildly dilated in size with normal systolic function. A pacer wire is present in RV. No significant valvular heart disease or evidence for pulmonary hypertension.     3. Acute Coronary Syndrome   A. Echo 03/27/2018- Technically difficult study with limited endocardial definition and  structural resolution in the apical views, Intravenous contrast was administered to enhance endocardial definition, LV cavity size is norma, mild concentric left ventricular hypertrophy, LV systolic function is at the lower limits of normal, EF 50%, no regional wall motion abnormalities present, LV diastolic filling pattern is probably pseudonormalized, consistent with grade 2 left ventricular diastolic dysfunction and elevated left atrial pressure, Right-sided chambers are not optimally visualized but appear grossly normal in size, Pacemaker lead is visualized within the right heart chamber, no significant valvular heart disease.   B. Stress 03/28/2018- Completed vasodilator protocol., No ischemic changes with infusion, No arrhythmias, Overall, low risk vasodilator stress rest SPECT perfusion study, a large moderate to severe perfusion defect involving the apex, inferior stripe, mid and basal inferoseptal and inferolateral segments with mild reversibility suggestive of possible ischemia but with likely strong component of diaphragmatic attenuation artifact, Also anteroseptal perfusion defect from attenuation artifact, No significant findings of ischemia Noted, Dilated LV with mildly decreased left ventricular function, stress EF 44%.   C. THC 03/30/2018- Negative heart cath. False positive stress test.     4. Bradycardia/ SSS   A. 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 [facility])     5. PVCs   A. 48 hour holter (7/17) with 19% PVCs.   B. PVC ablation in 8/17 but PVCs recurred.   C. Holter (11/17) with rare PVCs, < 1%.   D. Holter (9/18) with rare PVCs only. 6. NSVT   7. Type II diabetes   8. Hypertriglyceridemia   9.  Gout      Past Surgical History:   has a past surgical history that includes APPENDECTOMY (OPEN); Cardiac pacemaker placement; Right & Left Heart Cath  w/ Coronary Angios, LV/LA (Right, 03/30/2018); and Right Heart Cath (Right, 06/08/2019).    Family History:  family history  includes Diabetes in his brother and paternal grandmother; Heart attack in his sister; Heart disease in his brother, father, and sister; Hypertension in his father; No known problems in his mother.  Married    Social History:   reports that he has quit smoking. He has quit using smokeless tobacco. He reports that he does not drink alcohol and does not use drugs.    Allergies:  has No Known Allergies.    Medications:    Current Outpatient Medications:     Blood Glucose Monitor, generic, Kit, Use as directed to monitor blood sugar, Disp: 1 kit, Rfl: 1    Blood Glucose Monitoring Suppl (OneTouch Ultra 2) w/Device Kit, USE AS DIRECTED TO MONITOR BLOOD SUGAR, Disp: , Rfl:     bumetanide (BUMEX) 2 MG tablet, Take 2 tablets (4 mg total) by mouth every morning AND 1 tablet (2 mg total) Daily after lunch. May also  take 1 tablet (2 mg total) as needed (weight gain of 2 lbs overnight or 5 lbs in 1 week)., Disp: , Rfl:     Cholecalciferol (vitamin D3) 25 MCG (1000 UT) Cap, Take 1,000 Units by mouth daily, Disp: , Rfl:     Continuous Blood Gluc Sensor (FreeStyle Libre 2 Sensor Systm) Misc, 1 Device by Does not apply route every 14 (fourteen) days, Disp: 2 each, Rfl: 12    empagliflozin (Jardiance) 10 MG tablet, Take 1 tablet (10 mg total) by mouth every morning, Disp: 30 tablet, Rfl: 2    glucose blood test strip, Check BS 3 times daily, Disp: 270 each, Rfl: 3    insulin glargine (LANTUS SOLOSTAR) 100 UNIT/ML injection pen, 36 units twice a day, Disp: 75 mL, Rfl: 3    insulin lispro, 1 Unit Dial, (HumaLOG) 100 UNIT/ML Solution Pen-injector injection pen, INJECT 14 UNITS PLUS SLIDING SCALE 2:50>150 THREE TIMES DAILY BEFORE MEALS. Max 75 U daily., Disp: 75 mL, Rfl: 3    Insulin Pen Needle (Pen Needles 3/16") 31G X 5 MM Misc, 1 each 5 times per day, Disp: 450 each, Rfl: 3    Insulin Syringes, Disposable, U-100 1 ML Misc, 1 each by Does not apply route 2 (two) times daily, Disp: 180 each, Rfl: 0    Lancets Ultra Fine Misc, Check BS  5 times daily, Disp: 150 each, Rfl: 11    lisinopril (ZESTRIL) 5 MG tablet, Take 1 tablet (5 mg total) by mouth daily, Disp: 90 tablet, Rfl: 3    metFORMIN (GLUCOPHAGE) 500 MG tablet, Take 1,000 mg by mouth 2 (two) times daily with meals, Disp: , Rfl:     metOLazone (ZAROXOLYN) 5 MG tablet, Take 2.5 mg by mouth as needed (weight gain of greater than 5 lbs in 1 week), Disp: , Rfl:     potassium chloride (K-DUR) 10 MEQ tablet, Take 1 tablet (10 mEq total) by mouth daily, Disp: 90 tablet, Rfl: 3    Semaglutide, 1 MG/DOSE, (Ozempic, 1 MG/DOSE,) 2 MG/1.5ML Solution Pen-injector, Inject 1 mg into the skin once a week, Disp: 9 mL, Rfl: 3    spironolactone (ALDACTONE) 25 MG tablet, Take 1 tablet (25 mg total) by mouth daily, Disp: 90 tablet, Rfl: 3       Objective     Visit Vitals  BP (!) 136/94   Pulse 99   Wt 152.9 kg (337 lb)   SpO2 95%   BMI 43.27 kg/m   B/p recheck: 140/86    Wt Readings from Last 7 Encounters:   12/29/20 152.9 kg (337 lb)   12/05/20 153.4 kg (338 lb 2 oz)   11/29/20 158.1 kg (348 lb 9.6 oz)   11/28/20 158.3 kg (348 lb 15.8 oz)   08/17/20 149.7 kg (330 lb 2 oz)   07/26/20 152.9 kg (337 lb)   07/19/20 152 kg (335 lb)       Physical Exam:   General: well-developed male, alert, NAD  HEENT:  Anicteric sclera  Neck:  no JVD  Lungs: bilateral breath sounds clear; no rhonchi, wheezes or crackles with normal respiratory effort  Cardiovascular: s1s2, normal rate, no m/r/g  Abdominal: protuberant; soft; non-tender  Extremities: skin warm and dry; no edema noted lower extremities/feet  Neuromuscular exam:  MAEW. Rises from chair independently; ambulates without difficulty.   Skin: no rash or ulceration.      Laboratory Data:  Lab Results   Component Value Date/Time    NA 142 05/21/2020 08:27 PM  K 3.8 05/21/2020 08:27 PM    CL 108 05/21/2020 08:27 PM    CO2 21 05/21/2020 08:27 PM    CA 9.9 05/21/2020 08:27 PM    GLU 115 (H) 05/21/2020 08:27 PM    CREAT 1.60 (H) 12/29/2020 08:37 AM    CREAT 1.13 05/21/2020  08:27 PM    BUN 19 05/21/2020 08:27 PM    PROT 7.1 12/26/2018 06:20 PM    ALB 4.2 12/26/2018 06:20 PM    ALKPHOS 67 12/26/2018 06:20 PM    ALT 24 12/26/2018 06:20 PM    AST 15 12/26/2018 06:20 PM    BILITOTAL 0.3 12/26/2018 06:20 PM    AGRATIO 1.45 12/26/2018 06:20 PM    ANIONGAP 16.8 05/21/2020 08:27 PM    BUNCRRATIO 16.8 05/21/2020 08:27 PM    EGFR 52 (L) 12/29/2020 08:37 AM    OSMO 286 05/21/2020 08:27 PM    GLOB 2.9 12/26/2018 06:20 PM         Additional Non-ischemic w/u panel:  Lab Results   Component Value Date/Time    TSH 0.98 03/08/2020 10:59 AM    T4FREE Not Indicated 03/08/2020 10:59 AM       No results found for: HIVAGAB    No results found for: HCVAB, HBV, HEPBCOREAB     Lab Results   Component Value Date/Time    WBC 8.9 05/21/2020 08:27 PM    RBC 4.34 05/21/2020 08:27 PM    HGB 13.4 05/21/2020 08:27 PM    HCT 39.8 05/21/2020 08:27 PM    PLT 307 05/21/2020 08:27 PM     Lab Results   Component Value Date/Time    CHOL 206 (H) 05/01/2020 08:17 AM    TRIG 574 (HH) 05/01/2020 08:17 AM    TRIG 228 (H) 03/26/2018 04:24 PM    HDL 36 (L) 05/01/2020 08:17 AM    LDL 79 05/01/2020 08:17 AM     Lab Results   Component Value Date/Time    HGBA1CPERCNT 8.2 03/26/2018 04:24 PM     Lab Results   Component Value Date/Time    BNP 10.2 05/21/2020 08:27 PM              Today's I-stat Labs:  Results       Procedure Component Value Units Date/Time    i-Stat Chem 8 CartrIDge [[id number]]  (Abnormal) Collected: 12/29/20 0837    Specimen: Blood Updated: 12/29/20 0841     Sodium I-Stat 140 mMol/L      Potassium I-Stat 3.7 mMol/L      Chloride I-Stat 101 mMol/L      TCO2 I-Stat 31 mMol/L      Calcium Ionized I-Stat 4.70 mg/dL      Glucose I-Stat 409 mg/dL      Creatinine I-Stat 1.60 mg/dL      BUN I-Stat 30 mg/dL      Anion Gap I-Stat 81.1     EGFR 52 mL/min/1.17m2      Hematocrit I-Stat 50.0 %      Hemoglobin I-Stat 17.0 gm/dL              Assessment and Plan:  1. HFrEF (heart failure with reduced ejection fraction)    2. Essential  hypertension    3. Type 2 diabetes mellitus treated with insulin    4. S/P placement of cardiac pacemaker         My impression is that, Mr. Edgell has a stage C, nonischemic cardiomyopathy and currently reports NYHA Class III level of symptoms.  he  is well compensated and well perfused on exam. he does not have evidence of end-organ hypoperfusion based on labs and clinical exam.     We will be monitoring closely for changes in heart failure symptoms, electrolyte abnormalities and arrhythmias.    In regards to GDMT:  Beta-blocker: no -does not tolerate; has extreme fatigue  ACE-I/ARB/ARNI:yes; Lisinopril 5 mg daily  Aldosterone antagonist:yes; Aldactone 25 mg once a day  Hydralazine/nitrate: no   Digoxin: no    SGLT2i:  yes; Jardiance 10 mg daily    Med Changes and plan:  Weight stable since last office visit.  He feels better.  BUN: 30 creatinine 1.6.  Decrease bumex dosing to 2 mg at lunch; continue Bumex 4 mg in the morning, and PRN Bumex 2 mg for weight gain 2 or more pounds in 24 hours or 5 pounds in one week  Continue jardiance  Avoid metolazone  Due to creatinine of 1.6 today, did not increase his Lisinopril.  Will have him monitor B/P and contact office if consistently >130/80.  Will plan to add Hydralazine if B/P above goal  Follow up here in clinic in 3 weeks; sooner if needed  Keep follow up appointments on 01/04/21 for echo, device check, and appointment at cardiology office    Devices - CRT-D:   he  is not a candidate for cardiac resynchronization therapy.    No ICD indicated as EF >35-40%, therefore he is not a candidate for ICD therapy.    Has PPM in place followed by [facility]    Diuretics: as noted above    Exercise :  he  is not a candidate for cardiac rehabilitation.     Counseling:  -I have encouraged pt to continue to follow a <2gm/day Na restriction and emphasized limit fluid intake to < 2L/day.  -I have requested pt perform daily wts and contact our office for increase of  >2-3lbs in 1 day or 5lbs in 1 week.   -I have encouraged pt to engage in aerobic activity as tolerated and avoid heavy weight lifting.   -Smoking/Alcohol: I have advised pt to abstain from any tobacco use and excessive alcohol intake.    -Weight: Obesity is a significant risk for both short and long-term morbidity and mortality for AHF patients.  I have stressed the importance of weight control and dietary management.        Our office is available to answer any questions you or the patient may have. We appreciate the opportunity to participate in the care of your patients.      Electronically signed by:      Merita Norton, NP  [facility]  [facility]  [facility]  [address]  [place], [place] [address]  [phone]   [phone] - fax             Note: This chart was generated by the [facility] EMR system/speech recognition and may contain inherit omission or errors not intended by the user.  Grammatical errors, random word insertions, deletions, pronoun errors and incomplete sentences are occasionally consequences of this technology due to software limitations.  Not all errors are caught or corrected.  If there are questions or concerns about the content of this note or information contained in the body of this dictation they should be addressed directly with the author for clarification.

## 2020-12-29 ENCOUNTER — Ambulatory Visit (HOSPITAL_BASED_OUTPATIENT_CLINIC_OR_DEPARTMENT_OTHER)
Admission: RE | Admit: 2020-12-29 | Discharge: 2020-12-29 | Disposition: A | Discharge status: Home / Self Care | Payer: 59 | Source: Ambulatory Visit | Admitting: Family

## 2020-12-29 ENCOUNTER — Encounter: Payer: Self-pay | Admitting: Family

## 2020-12-29 VITALS — BP 136/94 | HR 99 | Wt 337.0 lb

## 2020-12-29 DIAGNOSIS — I10 Essential (primary) hypertension: Secondary | ICD-10-CM

## 2020-12-29 DIAGNOSIS — I5020 Unspecified systolic (congestive) heart failure: Secondary | ICD-10-CM

## 2020-12-29 DIAGNOSIS — Z794 Long term (current) use of insulin: Secondary | ICD-10-CM

## 2020-12-29 DIAGNOSIS — Z950 Presence of cardiac pacemaker: Secondary | ICD-10-CM

## 2020-12-29 DIAGNOSIS — E119 Type 2 diabetes mellitus without complications: Secondary | ICD-10-CM

## 2020-12-29 LAB — I-STAT CHEM 8 CARTRIDGE
Anion Gap I-Stat: 13 (ref 7.0–16.0)
BUN I-Stat: 30 mg/dL — ABNORMAL HIGH (ref 7–22)
Calcium Ionized I-Stat: 4.7 mg/dL (ref 4.35–5.10)
Chloride I-Stat: 101 mMol/L (ref 98–110)
Creatinine I-Stat: 1.6 mg/dL — ABNORMAL HIGH (ref 0.80–1.30)
EGFR: 52 mL/min/{1.73_m2} — ABNORMAL LOW (ref 60–150)
Glucose I-Stat: 124 mg/dL — ABNORMAL HIGH (ref 71–99)
Hematocrit I-Stat: 50 % (ref 39.0–52.5)
Hemoglobin I-Stat: 17 gm/dL (ref 13.0–17.5)
Potassium I-Stat: 3.7 mMol/L (ref 3.5–5.3)
Sodium I-Stat: 140 mMol/L (ref 136–147)
TCO2 I-Stat: 31 mMol/L — ABNORMAL HIGH (ref 24–29)

## 2020-12-29 MED ORDER — BUMETANIDE 2 MG PO TABS
ORAL_TABLET | ORAL | Status: DC
Start: 2020-12-29 — End: 2021-01-16

## 2020-12-29 NOTE — Patient Instructions (Addendum)
Please change your Bumex to 4 mg each morning, and 2 mg at lunch time.    If you gain 2 or more pounds overnight, or 5 pounds in one week, then please take additional Bumex 2 mg that day.  If you are taking the additional Bumex 2 mg, three or more times a week, then please call the office.  We may want to check your lab work sooner.    Continue to with order for Zaroxolyn (metolazone) 2.5 mg by mouth as needed for weight gain that does not respond to the extra dose of Bumex.  Do not take the Zaroxolyn more than once a week.    If you take the Zaroxolyn 2.5 mg, then also take additional potassium chloride 10 meq tablet that day.    Please monitor your blood pressure 5-6 times a week, and record the results. If your blood pressure is consistently greater than 130/80, then please call the office  [phone].     Follow up in clinic in 3 weeks; sooner if needed.     Please keep your upcoming appointments for echocardiogram and appointment with cardiology on 01/04/21

## 2021-01-01 NOTE — Telephone Encounter (Signed)
NOTE AND LABS RECEIVED AND SCANNED TO 11.28.22 SCAN ONLY

## 2021-01-04 ENCOUNTER — Encounter: Payer: Self-pay | Admitting: Physician Assistant

## 2021-01-04 ENCOUNTER — Other Ambulatory Visit: Payer: Self-pay | Admitting: Cardiovascular Disease

## 2021-01-04 ENCOUNTER — Ambulatory Visit: Payer: 59

## 2021-01-04 ENCOUNTER — Ambulatory Visit: Payer: Self-pay | Admitting: Family Nurse Practitioner

## 2021-01-04 ENCOUNTER — Ambulatory Visit
Admission: RE | Admit: 2021-01-04 | Discharge: 2021-01-04 | Disposition: A | Discharge status: Home / Self Care | Payer: 59 | Source: Ambulatory Visit | Attending: Family | Admitting: Family

## 2021-01-04 ENCOUNTER — Ambulatory Visit: Payer: 59 | Admitting: Physician Assistant

## 2021-01-04 ENCOUNTER — Encounter: Payer: Self-pay | Admitting: Family Nurse Practitioner

## 2021-01-04 VITALS — BP 121/85 | HR 91 | Ht 74.0 in | Wt 345.0 lb

## 2021-01-04 VITALS — BP 123/79 | HR 87 | Wt 345.6 lb

## 2021-01-04 DIAGNOSIS — Z8679 Personal history of other diseases of the circulatory system: Secondary | ICD-10-CM

## 2021-01-04 DIAGNOSIS — I495 Sick sinus syndrome: Secondary | ICD-10-CM

## 2021-01-04 DIAGNOSIS — I493 Ventricular premature depolarization: Secondary | ICD-10-CM

## 2021-01-04 DIAGNOSIS — I5020 Unspecified systolic (congestive) heart failure: Secondary | ICD-10-CM

## 2021-01-04 DIAGNOSIS — Z950 Presence of cardiac pacemaker: Secondary | ICD-10-CM

## 2021-01-04 DIAGNOSIS — Z794 Long term (current) use of insulin: Secondary | ICD-10-CM

## 2021-01-04 DIAGNOSIS — I110 Hypertensive heart disease with heart failure: Secondary | ICD-10-CM | POA: Insufficient documentation

## 2021-01-04 DIAGNOSIS — I5032 Chronic diastolic (congestive) heart failure: Secondary | ICD-10-CM | POA: Insufficient documentation

## 2021-01-04 DIAGNOSIS — E119 Type 2 diabetes mellitus without complications: Secondary | ICD-10-CM

## 2021-01-04 DIAGNOSIS — I10 Essential (primary) hypertension: Secondary | ICD-10-CM

## 2021-01-04 DIAGNOSIS — I428 Other cardiomyopathies: Secondary | ICD-10-CM

## 2021-01-04 LAB — I-STAT CHEM 8 CARTRIDGE
Anion Gap I-Stat: 16 (ref 7.0–16.0)
BUN I-Stat: 24 mg/dL — ABNORMAL HIGH (ref 7–22)
Calcium Ionized I-Stat: 4.7 mg/dL (ref 4.35–5.10)
Chloride I-Stat: 101 mMol/L (ref 98–110)
Creatinine I-Stat: 1.5 mg/dL — ABNORMAL HIGH (ref 0.80–1.30)
EGFR: 56 mL/min/{1.73_m2} — ABNORMAL LOW (ref 60–150)
Glucose I-Stat: 143 mg/dL — ABNORMAL HIGH (ref 71–99)
Hematocrit I-Stat: 43 % (ref 39.0–52.5)
Hemoglobin I-Stat: 14.6 gm/dL (ref 13.0–17.5)
Potassium I-Stat: 3.9 mMol/L (ref 3.5–5.3)
Sodium I-Stat: 139 mMol/L (ref 136–147)
TCO2 I-Stat: 27 mMol/L (ref 24–29)

## 2021-01-04 MED ORDER — BUMETANIDE 0.25 MG/ML IJ SOLN
3.0000 mg | Freq: Once | INTRAMUSCULAR | Status: AC
Start: 2021-01-04 — End: 2021-01-04
  Administered 2021-01-04: 15:00:00 3 mg via INTRAVENOUS
  Filled 2021-01-04: qty 12

## 2021-01-04 MED ORDER — VH POTASSIUM CHLORIDE CRYS ER 20 MEQ PO TBCR (WRAP)
20.0000 meq | EXTENDED_RELEASE_TABLET | Freq: Once | ORAL | Status: AC
Start: 2021-01-04 — End: 2021-01-04
  Administered 2021-01-04: 15:00:00 20 meq via ORAL
  Filled 2021-01-04: qty 1

## 2021-01-04 MED ORDER — PERFLUTREN LIPID MICROSPHERE 6.52 MG/ML IV SUSP
1.5000 mL | Freq: Once | INTRAVENOUS | Status: AC
Start: 2021-01-04 — End: 2021-01-04
  Administered 2021-01-04: 1.5 mL via INTRAVENOUS

## 2021-01-04 NOTE — Progress Notes (Signed)
[facility]      Established Visit    Patient Name: Samuel Koch     Date of Birth: December 11, 1970    Provider: Elizbeth Squires, NP -C      Primary Cardiologist: Lovey Newcomer. [name], DO  Primary care provider:  Dr. [name]    Chief Complaint:    Follow-up urgent add on   Subjective     HPI:  I had the pleasure of seeing Samuel Koch today at [facility] for an advanced heart failure visit.  Samuel Koch is a 50 y.o. male with nonischemic cardiomyopathy,  recovered EF 55% (12/2020) 50-55% (08/13/2019); NYHA Class 3, ACC/AHA Stage C.  Samuel Koch also  has a past medical history of Congestive heart failure, Type 2 Diabetes mellitus, Hypertension, and PVC's (premature ventricular contractions)..      From a cardiovascular standpoint, Samuel Koch reports he is  not doing well.  He recently traveled to [place], and returned on 12/21/20.  Was seen in clinic for follow-up on December 29, 2020 when he returned from his trip.  He is here today for an urgent add-on visit as he has had weight gain at home of about 6 pounds in 2 days.  He did take extra Bumex yesterday but he feels he is still holding onto a lot of fluid.  He was seen by [facility] cardiology after his echocardiogram and was recommended to be seen for urgent diuretics.  He is trying to go to the gym.  He finds that he is having difficulty with his breathing when he is exercising due to the extra fluid.  Denies any dietary discretions.  he has continued to take Bumex 4 mg twice a day,    Samuel Koch reports that he has been exercising recently  He does not currently check blood pressures     He has been checking morning weights; his weight has been stable.  [place] has been following a fluid restriction of 2000 mL per day better and has restricting dietary sodium content to no more than 2 gm/day.    He reports that he has been compliant with his medications.      Review of Systems:  Review of Systems    Constitutional:  Positive for malaise/fatigue. Negative for chills, fever and weight loss.   Respiratory:  Positive for shortness of breath. Negative for cough.    Cardiovascular:  Positive for leg swelling. Negative for chest pain and palpitations.   Gastrointestinal:  Negative for abdominal pain, blood in stool, constipation, diarrhea, nausea and vomiting.   Genitourinary:  Negative for dysuria and hematuria.   Neurological:  Positive for dizziness.    Comprehensive 12 point review of system is negative unless described above in HPI    Review of Cardiovascular Testing:  EKG (07/19/2020):  sinus rhythm. Ventricular rate 87.     PMH:  1. Chronic combined systolic and diastolic CHF   A. RHC 06/08/19- wedge pressure 16, RB pressure 33/7. Pulmonary artery pressure 30/12    2. Nonischemic cardiomyopathy   A. LHC (6/17): 30% mid RCA, no other significant disease.   B. Echo (6/17): EF 30-35%.   C. Holter (7/17): 19% PVCs.   D. Echo (3/18): EF 50-55%, moderate LVH, grade II diastolic dysfunction.   E. Echo (9/18): EF 50-55%.   F. Echo 10/07/17- LV cavity size is increased. The LVEF is est. at 50%. Mild hypokinesis of the distal septum and apex which may  be due to paced rhythm. Pacer wire present in RV.   G. Echo 05/22/18- EF 50-55%. Grade II DD. Mitral valve leaflets mildly thickened without stenosis. Trace MR. Trace TR. Estimated pulmonary arterial pressure is 25-30 mmHg.   H. Echo 06/12/2018- Mild concentric LVH, EF 50-55%, aortic root is mildy dilated, aortic root is 3.9cm, tricuspid regurgitation is mild, RSVP .   I. Echo 08/13/19- EF 50-55%. Grade I diastolic dysfunction of the left ventricle (impaired relaxation pattern). The left ventricular ejection fraction is low normal, estimated at 50- 55%. RV mildly dilated in size with normal systolic function. A pacer wire is present in RV. No significant valvular heart disease or evidence for pulmonary hypertension.     3. Acute Coronary Syndrome   A. Echo 03/27/2018-  Technically difficult study with limited endocardial definition and structural resolution in the apical views, Intravenous contrast was administered to enhance endocardial definition, LV cavity size is norma, mild concentric left ventricular hypertrophy, LV systolic function is at the lower limits of normal, EF 50%, no regional wall motion abnormalities present, LV diastolic filling pattern is probably pseudonormalized, consistent with grade 2 left ventricular diastolic dysfunction and elevated left atrial pressure, Right-sided chambers are not optimally visualized but appear grossly normal in size, Pacemaker lead is visualized within the right heart chamber, no significant valvular heart disease.   B. Stress 03/28/2018- Completed vasodilator protocol., No ischemic changes with infusion, No arrhythmias, Overall, low risk vasodilator stress rest SPECT perfusion study, a large moderate to severe perfusion defect involving the apex, inferior stripe, mid and basal inferoseptal and inferolateral segments with mild reversibility suggestive of possible ischemia but with likely strong component of diaphragmatic attenuation artifact, Also anteroseptal perfusion defect from attenuation artifact, No significant findings of ischemia Noted, Dilated LV with mildly decreased left ventricular function, stress EF 44%.   C. THC 03/30/2018- Negative heart cath. False positive stress test.     4. Bradycardia/ SSS   A. 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 [facility])     5. PVCs   A. 48 hour holter (7/17) with 19% PVCs.   B. PVC ablation in 8/17 but PVCs recurred.   C. Holter (11/17) with rare PVCs, < 1%.   D. Holter (9/18) with rare PVCs only. 6. NSVT   7. Type II diabetes   8. Hypertriglyceridemia   9.  Gout      Past Surgical History:   has a past surgical history that includes APPENDECTOMY (OPEN); Cardiac pacemaker placement; Right & Left Heart Cath  w/ Coronary Angios, LV/LA (Right, 03/30/2018); and  Right Heart Cath (Right, 06/08/2019).    Family History:  family history includes Diabetes in his brother and paternal grandmother; Heart attack in his sister; Heart disease in his brother, father, and sister; Hypertension in his father; No known problems in his mother.  Married    Social History:   reports that he has quit smoking. He has quit using smokeless tobacco. He reports that he does not drink alcohol and does not use drugs.    Allergies:  has No Known Allergies.    Medications:    Current Outpatient Medications:     Blood Glucose Monitor, generic, Kit, Use as directed to monitor blood sugar, Disp: 1 kit, Rfl: 1    Blood Glucose Monitoring Suppl (OneTouch Ultra 2) w/Device Kit, USE AS DIRECTED TO MONITOR BLOOD SUGAR, Disp: , Rfl:     bumetanide (BUMEX) 2 MG tablet, Take 2 tablets (4 mg) by mouth every morning  AND 1 tablet (2 mg) Daily after lunch. May also take 1 tablet (2 mg) as needed (weight gain of 2 lbs overnight or 5 lbs in 1 week)., Disp: , Rfl:     Cholecalciferol (vitamin D3) 25 MCG (1000 UT) Cap, Take 1,000 Units by mouth daily, Disp: , Rfl:     Continuous Blood Gluc Sensor (FreeStyle Libre 2 Sensor Systm) Misc, 1 Device by Does not apply route every 14 (fourteen) days, Disp: 2 each, Rfl: 12    empagliflozin (Jardiance) 10 MG tablet, Take 1 tablet (10 mg total) by mouth every morning, Disp: 30 tablet, Rfl: 2    glucose blood test strip, Check BS 3 times daily, Disp: 270 each, Rfl: 3    insulin glargine (LANTUS SOLOSTAR) 100 UNIT/ML injection pen, 36 units twice a day, Disp: 75 mL, Rfl: 3    insulin lispro, 1 Unit Dial, (HumaLOG) 100 UNIT/ML Solution Pen-injector injection pen, INJECT 14 UNITS PLUS SLIDING SCALE 2:50>150 THREE TIMES DAILY BEFORE MEALS. Max 75 U daily., Disp: 75 mL, Rfl: 3    Insulin Pen Needle (Pen Needles 3/16") 31G X 5 MM Misc, 1 each 5 times per day, Disp: 450 each, Rfl: 3    Insulin Syringes, Disposable, U-100 1 ML Misc, 1 each by Does not apply route 2 (two) times daily, Disp:  180 each, Rfl: 0    Lancets Ultra Fine Misc, Check BS 5 times daily, Disp: 150 each, Rfl: 11    lisinopril (ZESTRIL) 5 MG tablet, Take 1 tablet (5 mg total) by mouth daily, Disp: 90 tablet, Rfl: 3    metFORMIN (GLUCOPHAGE) 500 MG tablet, Take 1,000 mg by mouth 2 (two) times daily with meals, Disp: , Rfl:     metOLazone (ZAROXOLYN) 5 MG tablet, Take 2.5 mg by mouth as needed (weight gain of greater than 5 lbs in 1 week), Disp: , Rfl:     potassium chloride (K-DUR) 10 MEQ tablet, Take 1 tablet (10 mEq total) by mouth daily, Disp: 90 tablet, Rfl: 3    Semaglutide, 1 MG/DOSE, (Ozempic, 1 MG/DOSE,) 2 MG/1.5ML Solution Pen-injector, Inject 1 mg into the skin once a week, Disp: 9 mL, Rfl: 3    spironolactone (ALDACTONE) 25 MG tablet, Take 1 tablet (25 mg total) by mouth daily, Disp: 90 tablet, Rfl: 3  No current facility-administered medications for this encounter.       Objective     Visit Vitals  BP 123/79   Pulse 87   Wt 156.8 kg (345 lb 9.6 oz)   SpO2 96%   BMI 44.37 kg/m   B/p recheck: 140/86    Wt Readings from Last 7 Encounters:   01/04/21 156.8 kg (345 lb 9.6 oz)   01/04/21 156.5 kg (345 lb)   12/29/20 152.9 kg (337 lb)   12/05/20 153.4 kg (338 lb 2 oz)   11/29/20 158.1 kg (348 lb 9.6 oz)   11/28/20 158.3 kg (348 lb 15.8 oz)   08/17/20 149.7 kg (330 lb 2 oz)       Physical Exam:   General: well-developed male, alert, NAD  HEENT:  Anicteric sclera  Neck:  no JVD  Lungs: bilateral breath sounds clear; no rhonchi, wheezes or crackles with normal respiratory effort  Cardiovascular: s1s2, normal rate, no m/r/g  Abdominal: protuberant; soft; non-tender  Extremities: skin warm and dry; 1+ edema noted lower extremities/feet  Neuromuscular exam:  MAEW. Rises from chair independently; ambulates without difficulty.   Skin: no rash or ulceration.  Laboratory Data:  Lab Results   Component Value Date/Time    NA 142 05/21/2020 08:27 PM    K 3.8 05/21/2020 08:27 PM    CL 108 05/21/2020 08:27 PM    CO2 21 05/21/2020 08:27 PM     CA 9.9 05/21/2020 08:27 PM    GLU 115 (H) 05/21/2020 08:27 PM    CREAT 1.50 (H) 01/04/2021 02:34 PM    CREAT 1.13 05/21/2020 08:27 PM    BUN 19 05/21/2020 08:27 PM    PROT 7.1 12/26/2018 06:20 PM    ALB 4.2 12/26/2018 06:20 PM    ALKPHOS 67 12/26/2018 06:20 PM    ALT 24 12/26/2018 06:20 PM    AST 15 12/26/2018 06:20 PM    BILITOTAL 0.3 12/26/2018 06:20 PM    AGRATIO 1.45 12/26/2018 06:20 PM    ANIONGAP 16.8 05/21/2020 08:27 PM    BUNCRRATIO 16.8 05/21/2020 08:27 PM    EGFR 56 (L) 01/04/2021 02:34 PM    OSMO 286 05/21/2020 08:27 PM    GLOB 2.9 12/26/2018 06:20 PM         Additional Non-ischemic w/u panel:  Lab Results   Component Value Date/Time    TSH 0.98 03/08/2020 10:59 AM    T4FREE Not Indicated 03/08/2020 10:59 AM       No results found for: HIVAGAB    No results found for: HCVAB, HBV, HEPBCOREAB     Lab Results   Component Value Date/Time    WBC 8.9 05/21/2020 08:27 PM    RBC 4.34 05/21/2020 08:27 PM    HGB 13.4 05/21/2020 08:27 PM    HCT 39.8 05/21/2020 08:27 PM    PLT 307 05/21/2020 08:27 PM     Lab Results   Component Value Date/Time    CHOL 206 (H) 05/01/2020 08:17 AM    TRIG 574 (HH) 05/01/2020 08:17 AM    TRIG 228 (H) 03/26/2018 04:24 PM    HDL 36 (L) 05/01/2020 08:17 AM    LDL 79 05/01/2020 08:17 AM     Lab Results   Component Value Date/Time    HGBA1CPERCNT 8.2 03/26/2018 04:24 PM     Lab Results   Component Value Date/Time    BNP 10.2 05/21/2020 08:27 PM              Today's I-stat Labs:  Results       Procedure Component Value Units Date/Time    i-Stat Chem 8 CartrIDge [[id number]]  (Abnormal) Collected: 01/04/21 1434    Specimen: Blood Updated: 01/04/21 1438     Sodium I-Stat 139 mMol/L      Potassium I-Stat 3.9 mMol/L      Chloride I-Stat 101 mMol/L      TCO2 I-Stat 27 mMol/L      Calcium Ionized I-Stat 4.70 mg/dL      Glucose I-Stat 409 mg/dL      Creatinine I-Stat 1.50 mg/dL      BUN I-Stat 24 mg/dL      Anion Gap I-Stat 81.1     EGFR 56 mL/min/1.109m2      Hematocrit I-Stat 43.0 %       Hemoglobin I-Stat 14.6 gm/dL              Assessment and Plan:  1. HFrEF (heart failure with reduced ejection fraction)    2. NICM (nonischemic cardiomyopathy)    3. PVC (premature ventricular contraction)    4. Essential hypertension    5. Type 2 diabetes mellitus treated with insulin  My impression is that, Samuel Koch has a stage C, nonischemic cardiomyopathy and currently reports NYHA Class III level of symptoms.      he  is well compensated and well perfused on exam. he does not have evidence of end-organ hypoperfusion based on labs and clinical exam.     We will be monitoring closely for changes in heart failure symptoms, electrolyte abnormalities and arrhythmias.    In regards to GDMT:  Beta-blocker: no -does not tolerate; has extreme fatigue  ACE-I/ARB/ARNI:yes; Lisinopril 5 mg daily  Aldosterone antagonist:yes; Aldactone 25 mg once a day  Hydralazine/nitrate: no   Digoxin: no    SGLT2i:  yes; Jardiance 10 mg daily    Med Changes and plan:  -Provided IV Bumex 3 mg with 20 mEq of potassium  -Continue maintenance dose of diuretics may increase to 4 mg twice a day of Bumex as needed for weight gain at home  -Has scheduled follow-up in 2 weeks    Devices - CRT-D:   he  is not a candidate for cardiac resynchronization therapy.    No ICD indicated as EF >35-40%, therefore he is not a candidate for ICD therapy.    Has PPM in place followed by [facility]    Diuretics: as noted above    Exercise :  he  is not a candidate for cardiac rehabilitation.     Counseling:  -I have encouraged pt to continue to follow a <2gm/day Na restriction and emphasized limit fluid intake to < 2L/day.  -I have requested pt perform daily wts and contact our office for increase of >2-3lbs in 1 day or 5lbs in 1 week.   -I have encouraged pt to engage in aerobic activity as tolerated and avoid heavy weight lifting.   -Smoking/Alcohol: I have advised pt to abstain from any tobacco use and excessive alcohol intake.    -Weight:  Obesity is a significant risk for both short and long-term morbidity and mortality for AHF patients.  I have stressed the importance of weight control and dietary management.        Our office is available to answer any questions you or the patient may have. We appreciate the opportunity to participate in the care of your patients.      Electronically signed by:    Deatra James. Georg Ruddle, FNP-C  Clinical Coordinator [facility]  [facility]  [facility]  [address]  [place], [place] [address]  [phone]   [phone] - fax         Note: This chart was generated by the [facility] EMR system/speech recognition and may contain inherit omission or errors not intended by the user.  Grammatical errors, random word insertions, deletions, pronoun errors and incomplete sentences are occasionally consequences of this technology due to software limitations.  Not all errors are caught or corrected.  If there are questions or concerns about the content of this note or information contained in the body of this dictation they should be addressed directly with the author for clarification.

## 2021-01-04 NOTE — Progress Notes (Signed)
Cardiology Follow-Up      Patient Name: Samuel Koch   Date of Birth: 16-Dec-1970    Provider: Magdalen Spatz, PA     Patient Care Team:  Edmon Crape, MD as PCP - General (Family Medicine)  Abel Presto, DO as Consulting Physician (Cardiology)  Elizbeth Squires, NP as Nurse Practitioner (Family Nurse Practitioner)  Joneen Roach, PA as Physician Assistant (Physician Assistant)    Chief Complaint: Congestive Heart Failure and Cardiomyopathy      Impression      1. PVC (premature ventricular contraction)    2. SSS (sick sinus syndrome)    3. S/P placement of cardiac pacemaker    4. History of cardiomyopathy    Plan     1. PVCs: s/p PVC ablation in 2017. PVC burden less than 1% by last 48 hour holter.     2. Sinus node dysfunction: s/p dual chamber pacemaker (2017). Normal device function, continue routine device checks     3. History of nonischemic cardiomyopathy EF recovered 55-60%, previously 30% in 2017. Followed by HF clinic. Quick weight gain, edema, and SOB.     4. Type II diabetes: followed by primary care     5. Morbid obesity      PLAN:   Discuss current symptom concerns with HF clinic.    Continue current prescription medications and routine device checks.  Report any worsening symptoms, or questions.   Follow-up in 6 months.     History of Present Illness   [name] is a 50 y.o. male being seen today for follow-up.      He has a history hypertension, chronic combined systolic and diastolic CHF, NICM, sinus node dysfunction, type II diabetes, PVCs, and NSVT. S/p dual chamber pacemaker implant in 2017. S/p PVC ablation in 2017.     He was seen by HF clinic recently, his diuretic dosing was reduced. In the past week, he has noted weight gain, swelling, SOB, cough/phlegm production. He notes dizziness with head movement and standing. He has been going to the gym this week and walking on the treadmill. No persistent elevation of heart rate, or major chest pain.      Device interrogation:  21  SVT-ST events recorded; longest episode lasting up to approx in duration. 9 brief nsVT events recorded; <5secs in durations. No AHR episodes recorded. Leads are stable. HR histograms are OK. AP: 20.6%. VP: <0.1%. Battery life: 5 years.     Conclusions        1: Technically difficult study most likely due to body habitus; Definity contrast was utilized                       2: The left ventricular cavity size is normal. The left ventricular wall thicknesses are normal. The left ventricular ejection fraction is normal, estimated at 55-60%. There is grade I diastolic dysfunction of the left ventricle.                       3: Normal atrial sizes.                       4: No significant valvular abnormalities identified although the valves were not well visualized.                       5: Compared to 03/27/2018 there is probably no significant change  Past Medical History     PMH - 01/02/2021 - CC/DG  LOV - 07/19/2020 - CC  LABS - 01/01/2021 - In Epic  EKG - 07/24/2020      1. Chronic combined systolic and diastolic CHF   A. RHC 06/08/19- wedge pressure 16, RB pressure 33/7. Pulmonary artery pressure 30/12    2. Nonischemic cardiomyopathy   A. LHC (6/17): 30% mid RCA, no other significant disease.   B. Echo (6/17): EF 30-35%.   C. Holter (7/17): 19% PVCs.   D. Echo (3/18): EF 50-55%, moderate LVH, grade II diastolic dysfunction.   E. Echo (9/18): EF 50-55%.   F. Echo 10/07/17- LV cavity size is increased. The LVEF is est. at 50%. Mild hypokinesis of the distal septum and apex which may be due to paced rhythm. Pacer wire present in RV.   G. Echo 05/22/18- EF 50-55%. Grade II DD. Mitral valve leaflets mildly thickened without stenosis. Trace MR. Trace TR. Estimated pulmonary arterial pressure is 25-30 mmHg.   H. Echo 06/12/2018- Mild concentric LVH, EF 50-55%, aortic root is mildy dilated, aortic root is 3.9cm, tricuspid regurgitation is mild, RSVP .   I. Echo 08/13/19- EF 50-55%. Grade I diastolic dysfunction of  the left ventricle (impaired relaxation pattern). The left ventricular ejection fraction is low normal, estimated at 50- 55%. RV mildly dilated in size with normal systolic function. A pacer wire is present in RV. No significant valvular heart disease or evidence for pulmonary hypertension.     3. Acute Coronary Syndrome   A. Echo 03/27/2018- Technically difficult study with limited endocardial definition and structural resolution in the apical views, Intravenous contrast was administered to enhance endocardial definition, LV cavity size is norma, mild concentric left ventricular hypertrophy, LV systolic function is at the lower limits of normal, EF 50%, no regional wall motion abnormalities present, LV diastolic filling pattern is probably pseudonormalized, consistent with grade 2 left ventricular diastolic dysfunction and elevated left atrial pressure, Right-sided chambers are not optimally visualized but appear grossly normal in size, Pacemaker lead is visualized within the right heart chamber, no significant valvular heart disease.   B. Stress 03/28/2018- Completed vasodilator protocol., No ischemic changes with infusion, No arrhythmias, Overall, low risk vasodilator stress rest SPECT perfusion study, a large moderate to severe perfusion defect involving the apex, inferior stripe, mid and basal inferoseptal and inferolateral segments with mild reversibility suggestive of possible ischemia but with likely strong component of diaphragmatic attenuation artifact, Also anteroseptal perfusion defect from attenuation artifact, No significant findings of ischemia Noted, Dilated LV with mildly decreased left ventricular function, stress EF 44%.   C. THC 03/30/2018- Negative heart cath. False positive stress test.     4. Bradycardia/ SSS   A. 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 [facility])     5. PVCs   A. 48 hour holter (7/17) with 19% PVCs.   B. PVC ablation in 8/17 but PVCs recurred.   C.  Holter (11/17) with rare PVCs, < 1%.   D. Holter (9/18) with rare PVCs only. 6. NSVT   7. Type II diabetes   8. Hypertriglyceridemia    Allergies     has No Known Allergies.    Medications       Current Outpatient Medications:     Blood Glucose Monitor, generic, Kit, Use as directed to monitor blood sugar, Disp: 1 kit, Rfl: 1    Blood Glucose Monitoring Suppl (OneTouch Ultra 2) w/Device Kit, USE AS DIRECTED TO MONITOR BLOOD  SUGAR, Disp: , Rfl:     bumetanide (BUMEX) 2 MG tablet, Take 2 tablets (4 mg) by mouth every morning AND 1 tablet (2 mg) Daily after lunch. May also take 1 tablet (2 mg) as needed (weight gain of 2 lbs overnight or 5 lbs in 1 week)., Disp: , Rfl:     Cholecalciferol (vitamin D3) 25 MCG (1000 UT) Cap, Take 1,000 Units by mouth daily, Disp: , Rfl:     Continuous Blood Gluc Sensor (FreeStyle Libre 2 Sensor Systm) Misc, 1 Device by Does not apply route every 14 (fourteen) days, Disp: 2 each, Rfl: 12    empagliflozin (Jardiance) 10 MG tablet, Take 1 tablet (10 mg total) by mouth every morning, Disp: 30 tablet, Rfl: 2    glucose blood test strip, Check BS 3 times daily, Disp: 270 each, Rfl: 3    insulin glargine (LANTUS SOLOSTAR) 100 UNIT/ML injection pen, 36 units twice a day, Disp: 75 mL, Rfl: 3    insulin lispro, 1 Unit Dial, (HumaLOG) 100 UNIT/ML Solution Pen-injector injection pen, INJECT 14 UNITS PLUS SLIDING SCALE 2:50>150 THREE TIMES DAILY BEFORE MEALS. Max 75 U daily., Disp: 75 mL, Rfl: 3    Insulin Pen Needle (Pen Needles 3/16") 31G X 5 MM Misc, 1 each 5 times per day, Disp: 450 each, Rfl: 3    Insulin Syringes, Disposable, U-100 1 ML Misc, 1 each by Does not apply route 2 (two) times daily, Disp: 180 each, Rfl: 0    Lancets Ultra Fine Misc, Check BS 5 times daily, Disp: 150 each, Rfl: 11    lisinopril (ZESTRIL) 5 MG tablet, Take 1 tablet (5 mg total) by mouth daily, Disp: 90 tablet, Rfl: 3    metFORMIN (GLUCOPHAGE) 500 MG tablet, Take 1,000 mg by mouth 2 (two) times daily with meals, Disp: ,  Rfl:     metOLazone (ZAROXOLYN) 5 MG tablet, Take 2.5 mg by mouth as needed (weight gain of greater than 5 lbs in 1 week), Disp: , Rfl:     potassium chloride (K-DUR) 10 MEQ tablet, Take 1 tablet (10 mEq total) by mouth daily, Disp: 90 tablet, Rfl: 3    Semaglutide, 1 MG/DOSE, (Ozempic, 1 MG/DOSE,) 2 MG/1.5ML Solution Pen-injector, Inject 1 mg into the skin once a week, Disp: 9 mL, Rfl: 3    spironolactone (ALDACTONE) 25 MG tablet, Take 1 tablet (25 mg total) by mouth daily, Disp: 90 tablet, Rfl: 3    Physical Exam   Visit Vitals  BP 121/85 (BP Site: Left arm, Patient Position: Sitting)   Pulse 91   Ht 1.88 m (6\' 2" )   Wt 156.5 kg (345 lb)   BMI 44.30 kg/m     Vitals:    01/04/21 1322   BP: 121/85   BP Site: Left arm   Patient Position: Sitting   Pulse: 91   Weight: 156.5 kg (345 lb)   Height: 1.88 m (6\' 2" )     Wt Readings from Last 3 Encounters:   01/04/21 156.8 kg (345 lb 9.6 oz)   01/04/21 156.5 kg (345 lb)   12/29/20 152.9 kg (337 lb)        Constitutional -  Well appearing, and in no distress  Respiratory - Clear to auscultation bilaterally, normal respiratory effort    Cardiovascular system -    Regular rate and rhythm    Normal S1, S2    No murmurs, rubs, gallops   Jugular venous pulse is normal  Carotid upstroke normal, no carotid bruits auscultated   2+ pulses in the posterior tibial / dorsalis pedis bilaterally    Neurological - Alert, oriented, no focal neurological deficits  Extremities -  No clubbing or cyanosis. peripheral edema present    Labs - reviewed by me     Lab Results   Component Value Date/Time    WBC 8.9 05/21/2020 08:27 PM    RBC 4.34 05/21/2020 08:27 PM    HGB 13.4 05/21/2020 08:27 PM    HCT 39.8 05/21/2020 08:27 PM    PLT 307 05/21/2020 08:27 PM    TSH 0.98 03/08/2020 10:59 AM       Lab Results   Component Value Date/Time    NA 142 05/21/2020 08:27 PM    K 3.8 05/21/2020 08:27 PM    CL 108 05/21/2020 08:27 PM    CO2 21 05/21/2020 08:27 PM    GLU 115 (H) 05/21/2020 08:27 PM    BUN  19 05/21/2020 08:27 PM    CREAT 1.50 (H) 01/04/2021 02:34 PM    CREAT 1.13 05/21/2020 08:27 PM    PROT 7.1 12/26/2018 06:20 PM    ALKPHOS 67 12/26/2018 06:20 PM    AST 15 12/26/2018 06:20 PM    ALT 24 12/26/2018 06:20 PM       Lab Results   Component Value Date/Time    CHOL 206 (H) 05/01/2020 08:17 AM    TRIG 574 (HH) 05/01/2020 08:17 AM    TRIG 228 (H) 03/26/2018 04:24 PM    HDL 36 (L) 05/01/2020 08:17 AM    LDL 79 05/01/2020 08:17 AM       Lab Results   Component Value Date/Time    HGBA1CPERCNT 8.2 03/26/2018 04:24 PM       Cardiogenics:     EKG: NSR, low voltage          Electronically signed by:   Magdalen Spatz, PA  01/05/2021

## 2021-01-16 ENCOUNTER — Encounter: Payer: Self-pay | Admitting: Family

## 2021-01-16 ENCOUNTER — Ambulatory Visit (HOSPITAL_BASED_OUTPATIENT_CLINIC_OR_DEPARTMENT_OTHER)
Admission: RE | Admit: 2021-01-16 | Discharge: 2021-01-16 | Disposition: A | Discharge status: Home / Self Care | Payer: 59 | Source: Ambulatory Visit | Admitting: Family

## 2021-01-16 VITALS — BP 120/80 | HR 90 | Wt 327.8 lb

## 2021-01-16 DIAGNOSIS — Z6841 Body Mass Index (BMI) 40.0 and over, adult: Secondary | ICD-10-CM

## 2021-01-16 DIAGNOSIS — I10 Essential (primary) hypertension: Secondary | ICD-10-CM

## 2021-01-16 DIAGNOSIS — Z794 Long term (current) use of insulin: Secondary | ICD-10-CM

## 2021-01-16 DIAGNOSIS — I428 Other cardiomyopathies: Secondary | ICD-10-CM

## 2021-01-16 DIAGNOSIS — E119 Type 2 diabetes mellitus without complications: Secondary | ICD-10-CM

## 2021-01-16 DIAGNOSIS — I5020 Unspecified systolic (congestive) heart failure: Secondary | ICD-10-CM

## 2021-01-16 DIAGNOSIS — Z950 Presence of cardiac pacemaker: Secondary | ICD-10-CM

## 2021-01-16 LAB — I-STAT CHEM 8 CARTRIDGE
Anion Gap I-Stat: 15 (ref 7.0–16.0)
BUN I-Stat: 24 mg/dL — ABNORMAL HIGH (ref 7–22)
Calcium Ionized I-Stat: 4.9 mg/dL (ref 4.35–5.10)
Chloride I-Stat: 102 mMol/L (ref 98–110)
Creatinine I-Stat: 1.4 mg/dL — ABNORMAL HIGH (ref 0.80–1.30)
EGFR: 60 mL/min/{1.73_m2} (ref 60–150)
Glucose I-Stat: 101 mg/dL — ABNORMAL HIGH (ref 71–99)
Hematocrit I-Stat: 49 % (ref 39.0–52.5)
Hemoglobin I-Stat: 16.7 gm/dL (ref 13.0–17.5)
Potassium I-Stat: 3.8 mMol/L (ref 3.5–5.3)
Sodium I-Stat: 142 mMol/L (ref 136–147)
TCO2 I-Stat: 30 mMol/L — ABNORMAL HIGH (ref 24–29)

## 2021-01-16 MED ORDER — POTASSIUM CHLORIDE ER 10 MEQ PO TBCR
10.0000 meq | EXTENDED_RELEASE_TABLET | Freq: Every day | ORAL | 3 refills | Status: DC
Start: 2021-01-16 — End: 2021-02-13

## 2021-01-16 MED ORDER — BUMETANIDE 2 MG PO TABS
ORAL_TABLET | ORAL | 0 refills | Status: DC
Start: 2021-01-16 — End: 2021-04-10

## 2021-01-16 NOTE — Progress Notes (Signed)
[facility]      Established Visit    Patient Name: Samuel Koch     Date of Birth: 1971/01/26    Provider: Merita Norton, NP -C      Primary Cardiologist: Lovey Newcomer. [name], DO  Primary care provider:  Dr. [name]    Chief Complaint:    Follow-up   Subjective     HPI:  I had the pleasure of seeing Mr. [name] today at [facility] for an advanced heart failure visit.  [name] is a 50 y.o. male with nonischemic cardiomyopathy,  recovered EF 55-60% (01/04/21); previous 50-55% (08/13/2019); NYHA Class 3, ACC/AHA Stage C.  Patricia Nettle [place] also  has a past medical history of Congestive heart failure, Type 2 Diabetes mellitus, Hypertension, and PVC's (premature ventricular contractions).    From a cardiovascular standpoint, Mr. [name] reports he is doing better.    He had urgent add on appointment here on 01/04/21, due to weight gain 6 pounds in 2 days. He was given IV Bumex 3 mg with 20 mEq of potassium, and reports he diuresed well.  Since then, home weight has trended down.   Loop diuretic is Bumex 4 mg in the morning and 2 mg around lunch.  Has not taken PRN Bumex, and has not taken PRN Metolazone.    Denies orthopnea.  Continues with dry cough in the morning.   He is going to gym each day; walking 5K each day.  Denies chest pain. Baseline SOB with activity.  Doing well with exercise regimen.    Has not monitored home B/P, but he will try to start doing this.   His goal is in 6 weeks (end of January 2023); to have weight around 300 lbs.     Mr. Hensch reports that he has been exercising daily  He does not currently check blood pressures     He has been checking morning weights; his weight has been stable.  [name] has been following a fluid restriction of 2000 mL per day better and has restricting dietary sodium content to no more than 2 gm/day.    He reports that he has been compliant with his medications.      Review of Systems:  Review of  Systems   Constitutional:  Positive for malaise/fatigue. Negative for chills, fever and weight loss.   Respiratory:  Positive for shortness of breath. Negative for cough.    Cardiovascular:  Positive for leg swelling. Negative for chest pain and palpitations.   Gastrointestinal:  Negative for abdominal pain, blood in stool, constipation, diarrhea, nausea and vomiting.   Genitourinary:  Negative for dysuria and hematuria.   Neurological:  Positive for dizziness.    Comprehensive 12 point review of system is negative unless described above in HPI    Review of Cardiovascular Testing:  EKG (07/19/2020):  sinus rhythm. Ventricular rate 87.     PMH:  1. Chronic combined systolic and diastolic CHF   A. RHC 06/08/19- wedge pressure 16, RB pressure 33/7. Pulmonary artery pressure 30/12    2. Nonischemic cardiomyopathy   A. LHC (6/17): 30% mid RCA, no other significant disease.   B. Echo (6/17): EF 30-35%.   C. Holter (7/17): 19% PVCs.   D. Echo (3/18): EF 50-55%, moderate LVH, grade II diastolic dysfunction.   E. Echo (9/18): EF 50-55%.   F. Echo 10/07/17- LV cavity size is increased. The LVEF is est. at 50%. Mild hypokinesis of  the distal septum and apex which may be due to paced rhythm. Pacer wire present in RV.   G. Echo 05/22/18- EF 50-55%. Grade II DD. Mitral valve leaflets mildly thickened without stenosis. Trace MR. Trace TR. Estimated pulmonary arterial pressure is 25-30 mmHg.   H. Echo 06/12/2018- Mild concentric LVH, EF 50-55%, aortic root is mildy dilated, aortic root is 3.9cm, tricuspid regurgitation is mild, RSVP .   I. Echo 08/13/19- EF 50-55%. Grade I diastolic dysfunction of the left ventricle (impaired relaxation pattern). The left ventricular ejection fraction is low normal, estimated at 50- 55%. RV mildly dilated in size with normal systolic function. A pacer wire is present in RV. No significant valvular heart disease or evidence for pulmonary hypertension.     3. Acute Coronary Syndrome   A. Echo 03/27/2018-  Technically difficult study with limited endocardial definition and structural resolution in the apical views, Intravenous contrast was administered to enhance endocardial definition, LV cavity size is norma, mild concentric left ventricular hypertrophy, LV systolic function is at the lower limits of normal, EF 50%, no regional wall motion abnormalities present, LV diastolic filling pattern is probably pseudonormalized, consistent with grade 2 left ventricular diastolic dysfunction and elevated left atrial pressure, Right-sided chambers are not optimally visualized but appear grossly normal in size, Pacemaker lead is visualized within the right heart chamber, no significant valvular heart disease.   B. Stress 03/28/2018- Completed vasodilator protocol., No ischemic changes with infusion, No arrhythmias, Overall, low risk vasodilator stress rest SPECT perfusion study, a large moderate to severe perfusion defect involving the apex, inferior stripe, mid and basal inferoseptal and inferolateral segments with mild reversibility suggestive of possible ischemia but with likely strong component of diaphragmatic attenuation artifact, Also anteroseptal perfusion defect from attenuation artifact, No significant findings of ischemia Noted, Dilated LV with mildly decreased left ventricular function, stress EF 44%.   C. THC 03/30/2018- Negative heart cath. False positive stress test.     4. Bradycardia/ SSS   A. 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 [facility])     5. PVCs   A. 48 hour holter (7/17) with 19% PVCs.   B. PVC ablation in 8/17 but PVCs recurred.   C. Holter (11/17) with rare PVCs, < 1%.   D. Holter (9/18) with rare PVCs only. 6. NSVT   7. Type II diabetes   8. Hypertriglyceridemia   9.  Gout      Past Surgical History:   has a past surgical history that includes APPENDECTOMY (OPEN); Cardiac pacemaker placement; Right & Left Heart Cath  w/ Coronary Angios, LV/LA (Right, 03/30/2018); and  Right Heart Cath (Right, 06/08/2019).    Family History:  family history includes Diabetes in his brother and paternal grandmother; Heart attack in his sister; Heart disease in his brother, father, and sister; Hypertension in his father; No known problems in his mother.  Married    Social History:   reports that he has quit smoking. He has quit using smokeless tobacco. He reports that he does not drink alcohol and does not use drugs.    Allergies:  has No Known Allergies.    Medications:    Current Outpatient Medications:     Blood Glucose Monitor, generic, Kit, Use as directed to monitor blood sugar, Disp: 1 kit, Rfl: 1    Blood Glucose Monitoring Suppl (OneTouch Ultra 2) w/Device Kit, USE AS DIRECTED TO MONITOR BLOOD SUGAR, Disp: , Rfl:     bumetanide (BUMEX) 2 MG tablet, Take 2  tablets (4 mg) by mouth every morning AND 1 tablet (2 mg) Daily after lunch. May also take 1 tablet (2 mg) as needed (weight gain of 2 lbs overnight or 5 lbs in 1 week)., Disp: , Rfl:     Cholecalciferol (vitamin D3) 25 MCG (1000 UT) Cap, Take 1,000 Units by mouth daily, Disp: , Rfl:     Continuous Blood Gluc Sensor (FreeStyle Libre 2 Sensor Systm) Misc, 1 Device by Does not apply route every 14 (fourteen) days, Disp: 2 each, Rfl: 12    empagliflozin (Jardiance) 10 MG tablet, Take 1 tablet (10 mg total) by mouth every morning, Disp: 30 tablet, Rfl: 2    glucose blood test strip, Check BS 3 times daily, Disp: 270 each, Rfl: 3    insulin glargine (LANTUS SOLOSTAR) 100 UNIT/ML injection pen, 36 units twice a day, Disp: 75 mL, Rfl: 3    insulin lispro, 1 Unit Dial, (HumaLOG) 100 UNIT/ML Solution Pen-injector injection pen, INJECT 14 UNITS PLUS SLIDING SCALE 2:50>150 THREE TIMES DAILY BEFORE MEALS. Max 75 U daily., Disp: 75 mL, Rfl: 3    Insulin Pen Needle (Pen Needles 3/16") 31G X 5 MM Misc, 1 each 5 times per day, Disp: 450 each, Rfl: 3    Insulin Syringes, Disposable, U-100 1 ML Misc, 1 each by Does not apply route 2 (two) times daily, Disp:  180 each, Rfl: 0    Lancets Ultra Fine Misc, Check BS 5 times daily, Disp: 150 each, Rfl: 11    lisinopril (ZESTRIL) 5 MG tablet, Take 1 tablet (5 mg total) by mouth daily, Disp: 90 tablet, Rfl: 3    metFORMIN (GLUCOPHAGE) 500 MG tablet, Take 1,000 mg by mouth 2 (two) times daily with meals, Disp: , Rfl:     metOLazone (ZAROXOLYN) 5 MG tablet, Take 2.5 mg by mouth as needed (weight gain of greater than 5 lbs in 1 week), Disp: , Rfl:     potassium chloride (K-DUR) 10 MEQ tablet, Take 1 tablet (10 mEq total) by mouth daily, Disp: 90 tablet, Rfl: 3    Semaglutide, 1 MG/DOSE, (Ozempic, 1 MG/DOSE,) 2 MG/1.5ML Solution Pen-injector, Inject 1 mg into the skin once a week, Disp: 9 mL, Rfl: 3    spironolactone (ALDACTONE) 25 MG tablet, Take 1 tablet (25 mg total) by mouth daily, Disp: 90 tablet, Rfl: 3       Objective     Visit Vitals  BP 120/80   Pulse 90   Wt 148.7 kg (327 lb 13.2 oz)   SpO2 97%   BMI 42.09 kg/m       Wt Readings from Last 7 Encounters:   01/04/21 156.8 kg (345 lb 9.6 oz)   01/04/21 156.5 kg (345 lb)   12/29/20 152.9 kg (337 lb)   12/05/20 153.4 kg (338 lb 2 oz)   11/29/20 158.1 kg (348 lb 9.6 oz)   11/28/20 158.3 kg (348 lb 15.8 oz)   08/17/20 149.7 kg (330 lb 2 oz)       Physical Exam:   General: well-developed male, alert, NAD  HEENT:  Anicteric sclera  Neck:  no JVD  Lungs: bilateral breath sounds clear; no rhonchi, wheezes or crackles with normal respiratory effort  Cardiovascular: s1s2, normal rate, no m/r/g  Abdominal: protuberant; soft; non-tender  Extremities: skin warm and dry; no edema noted lower extremities/feet  Neuromuscular exam:  MAEW. Rises from chair independently; ambulates without difficulty.   Skin: no rash or ulceration.      Laboratory Data:  Lab Results   Component Value Date/Time    NA 142 05/21/2020 08:27 PM    K 3.8 05/21/2020 08:27 PM    CL 108 05/21/2020 08:27 PM    CO2 21 05/21/2020 08:27 PM    CA 9.9 05/21/2020 08:27 PM    GLU 115 (H) 05/21/2020 08:27 PM    CREAT 1.50 (H)  01/04/2021 02:34 PM    CREAT 1.13 05/21/2020 08:27 PM    BUN 19 05/21/2020 08:27 PM    PROT 7.1 12/26/2018 06:20 PM    ALB 4.2 12/26/2018 06:20 PM    ALKPHOS 67 12/26/2018 06:20 PM    ALT 24 12/26/2018 06:20 PM    AST 15 12/26/2018 06:20 PM    BILITOTAL 0.3 12/26/2018 06:20 PM    AGRATIO 1.45 12/26/2018 06:20 PM    ANIONGAP 16.8 05/21/2020 08:27 PM    BUNCRRATIO 16.8 05/21/2020 08:27 PM    EGFR 56 (L) 01/04/2021 02:34 PM    OSMO 286 05/21/2020 08:27 PM    GLOB 2.9 12/26/2018 06:20 PM         Additional Non-ischemic w/u panel:  Lab Results   Component Value Date/Time    TSH 0.98 03/08/2020 10:59 AM    T4FREE Not Indicated 03/08/2020 10:59 AM       No results found for: HIVAGAB    No results found for: HCVAB, HBV, HEPBCOREAB     Lab Results   Component Value Date/Time    WBC 8.9 05/21/2020 08:27 PM    RBC 4.34 05/21/2020 08:27 PM    HGB 13.4 05/21/2020 08:27 PM    HCT 39.8 05/21/2020 08:27 PM    PLT 307 05/21/2020 08:27 PM     Lab Results   Component Value Date/Time    CHOL 206 (H) 05/01/2020 08:17 AM    TRIG 574 (HH) 05/01/2020 08:17 AM    TRIG 228 (H) 03/26/2018 04:24 PM    HDL 36 (L) 05/01/2020 08:17 AM    LDL 79 05/01/2020 08:17 AM     Lab Results   Component Value Date/Time    HGBA1CPERCNT 8.2 03/26/2018 04:24 PM     Lab Results   Component Value Date/Time    BNP 10.2 05/21/2020 08:27 PM              Today's I-stat Labs:  Results       Procedure Component Value Units Date/Time    i-Stat Chem 8 CartrIDge [[id number]]  (Abnormal) Collected: 01/16/21 0903    Specimen: Blood Updated: 01/16/21 0906     Sodium I-Stat 142 mMol/L      Potassium I-Stat 3.8 mMol/L      Chloride I-Stat 102 mMol/L      TCO2 I-Stat 30 mMol/L      Calcium Ionized I-Stat 4.90 mg/dL      Glucose I-Stat 409 mg/dL      Creatinine I-Stat 1.40 mg/dL      BUN I-Stat 24 mg/dL      Anion Gap I-Stat 81.1     EGFR 60 mL/min/1.52m2      Hematocrit I-Stat 49.0 %      Hemoglobin I-Stat 16.7 gm/dL              Assessment and Plan:  1. HFrEF (heart failure  with reduced ejection fraction)    2. NICM (nonischemic cardiomyopathy)    3. Essential hypertension    4. S/P placement of cardiac pacemaker    5. Type 2 diabetes mellitus treated with insulin    6.  BMI 40.0-44.9, adult         My impression is that, Mr. Warzecha has a stage C, nonischemic cardiomyopathy and currently reports NYHA Class III level of symptoms.      he  is well compensated and well perfused on exam. he does not have evidence of end-organ hypoperfusion based on labs and clinical exam.     We will be monitoring closely for changes in heart failure symptoms, electrolyte abnormalities and arrhythmias.    In regards to GDMT:  Beta-blocker: no -does not tolerate; has extreme fatigue  ACE-I/ARB/ARNI:yes; Lisinopril 5 mg daily  Aldosterone antagonist:yes; Aldactone 25 mg once a day  Hydralazine/nitrate: no   Digoxin: no    SGLT2i:  yes; Jardiance 10 mg daily    Med Changes and plan:  -his weight is down 18 pounds since office visit on 01/04/21  -Continue maintenance dose of diuretics; continue Bumex 4 mg each morning and 2 mg at lunch.  May take additional Bumex 2 mg as needed for weight gain of 2 or more pounds overnight or 5 pounds in one week  -serum creatinine today improved: 1.4.  serum potassium today: 3.8.   today only, take K-Dur 20 meq by mouth, and tomorrow, resume K-Dur 10 meq by mouth  Instructed to monitor home B/P  Follow up in clinic in 4 weeks; sooner if needed  Has device check scheduled 04/04/21  Follow up with cardiology 07/05/21    Devices - CRT-D:   he  is not a candidate for cardiac resynchronization therapy.    No ICD indicated as EF >35-40%, therefore he is not a candidate for ICD therapy.    Has PPM in place followed by [facility]    Diuretics: as noted above    Exercise :  he  is not a candidate for cardiac rehabilitation.     Counseling:  -I have encouraged pt to continue to follow a <2gm/day Na restriction and emphasized limit fluid intake to < 2L/day.  -I have requested pt  perform daily wts and contact our office for increase of >2-3lbs in 1 day or 5lbs in 1 week.   -I have encouraged pt to engage in aerobic activity as tolerated and avoid heavy weight lifting.   -Smoking/Alcohol: I have advised pt to abstain from any tobacco use and excessive alcohol intake.    -Weight: Obesity is a significant risk for both short and long-term morbidity and mortality for AHF patients.  I have stressed the importance of weight control and dietary management.        Our office is available to answer any questions you or the patient may have. We appreciate the opportunity to participate in the care of your patients.      Electronically signed by:      Merita Norton, NP  [facility]  [facility]  [facility]  [address]  [place], [place] [address]  [phone]   [phone] - fax             Note: This chart was generated by the [facility] EMR system/speech recognition and may contain inherit omission or errors not intended by the user.  Grammatical errors, random word insertions, deletions, pronoun errors and incomplete sentences are occasionally consequences of this technology due to software limitations.  Not all errors are caught or corrected.  If there are questions or concerns about the content of this note or information contained in the  body of this dictation they should be addressed directly with the author for clarification.

## 2021-01-16 NOTE — Patient Instructions (Signed)
his weight is down 18 pounds since office visit on 01/04/21  -Continue maintenance dose of diuretics; continue Bumex 4 mg each morning and 2 mg at lunch.  May take additional Bumex 2 mg as needed for weight gain of 2 or more pounds overnight or 5 pounds in one week  -serum creatinine today improved: 1.4.  serum potassium today: 3.8.   today only, take K-Dur 20 meq by mouth, and tomorrow, resume K-Dur 10 meq by mouth  Follow up in clinic here in 4 weeks; sooner if needed  (call [phone] for questions or to change appointment)  Has device check scheduled 04/04/21  Follow up with cardiology 07/05/21

## 2021-02-12 ENCOUNTER — Encounter (INDEPENDENT_AMBULATORY_CARE_PROVIDER_SITE_OTHER): Payer: Self-pay

## 2021-02-13 ENCOUNTER — Ambulatory Visit
Admission: RE | Admit: 2021-02-13 | Discharge: 2021-02-13 | Disposition: A | Discharge status: Home / Self Care | Payer: 59 | Source: Ambulatory Visit | Attending: Family | Admitting: Family

## 2021-02-13 ENCOUNTER — Encounter: Payer: Self-pay | Admitting: Family Nurse Practitioner

## 2021-02-13 VITALS — BP 128/88 | HR 88 | Wt 319.6 lb

## 2021-02-13 DIAGNOSIS — I493 Ventricular premature depolarization: Secondary | ICD-10-CM

## 2021-02-13 DIAGNOSIS — I495 Sick sinus syndrome: Secondary | ICD-10-CM

## 2021-02-13 DIAGNOSIS — I5032 Chronic diastolic (congestive) heart failure: Secondary | ICD-10-CM | POA: Insufficient documentation

## 2021-02-13 DIAGNOSIS — Z950 Presence of cardiac pacemaker: Secondary | ICD-10-CM

## 2021-02-13 DIAGNOSIS — I110 Hypertensive heart disease with heart failure: Secondary | ICD-10-CM | POA: Insufficient documentation

## 2021-02-13 DIAGNOSIS — I428 Other cardiomyopathies: Secondary | ICD-10-CM

## 2021-02-13 DIAGNOSIS — I5020 Unspecified systolic (congestive) heart failure: Secondary | ICD-10-CM

## 2021-02-13 DIAGNOSIS — E119 Type 2 diabetes mellitus without complications: Secondary | ICD-10-CM

## 2021-02-13 DIAGNOSIS — Z8679 Personal history of other diseases of the circulatory system: Secondary | ICD-10-CM

## 2021-02-13 DIAGNOSIS — I10 Essential (primary) hypertension: Secondary | ICD-10-CM

## 2021-02-13 DIAGNOSIS — Z794 Long term (current) use of insulin: Secondary | ICD-10-CM

## 2021-02-13 LAB — I-STAT CHEM 8 CARTRIDGE
Anion Gap I-Stat: 17 — ABNORMAL HIGH (ref 7.0–16.0)
BUN I-Stat: 25 mg/dL — ABNORMAL HIGH (ref 7–22)
Calcium Ionized I-Stat: 4.5 mg/dL (ref 4.35–5.10)
Chloride I-Stat: 100 mMol/L (ref 98–110)
Creatinine I-Stat: 1.4 mg/dL — ABNORMAL HIGH (ref 0.80–1.30)
EGFR: 60 mL/min/{1.73_m2} (ref 60–150)
Glucose I-Stat: 90 mg/dL (ref 71–99)
Hematocrit I-Stat: 52 % (ref 39.0–52.5)
Hemoglobin I-Stat: 17.7 gm/dL — ABNORMAL HIGH (ref 13.0–17.5)
Potassium I-Stat: 3.3 mMol/L — ABNORMAL LOW (ref 3.5–5.3)
Sodium I-Stat: 141 mMol/L (ref 136–147)
TCO2 I-Stat: 28 mMol/L (ref 24–29)

## 2021-02-13 MED ORDER — POTASSIUM CHLORIDE ER 10 MEQ PO TBCR
10.0000 meq | EXTENDED_RELEASE_TABLET | Freq: Two times a day (BID) | ORAL | 3 refills | Status: DC
Start: 2021-02-13 — End: 2021-10-03

## 2021-02-13 NOTE — Progress Notes (Signed)
[facility]      Established Visit    Patient Name: Samuel Koch     Date of Birth: 1970/12/05    Provider: Elizbeth Squires, NP -C      Primary Cardiologist: Lovey Newcomer. [name], DO  Primary care provider:  Dr. [name]    Chief Complaint:    Follow-up   Subjective     HPI:  I had the pleasure of seeing [name] today at [facility] for an advanced heart failure visit.  Melik Blancett is a 51 y.o. male with nonischemic cardiomyopathy,  recovered EF 55-60% (01/04/21); previous 50-55% (08/13/2019); NYHA Class 3, ACC/AHA Stage C.  Samuel Nettle Hogrefe also  has a past medical history of Congestive heart failure, Type 2 Diabetes mellitus, Hypertension, and PVC's (premature ventricular contractions).    From a cardiovascular standpoint, [name] reports he is doing better.    He had urgent add on appointment here on 01/04/21, due to weight gain 6 pounds in 2 days. He was given IV Bumex 3 mg with 20 mEq of potassium, and reports he diuresed well.  Since then, home weight has trended down.   Loop diuretic is Bumex 4 mg in the morning and 2 mg around lunch.  Has not taken PRN Bumex, and has not taken PRN Metolazone.      Denies orthopnea.  He is going to gym each day; walking 4 milers per day.  denies chest pain. Baseline SOB with activity.  Doing well with exercise regimen.    He is feeling great at home.  He notes that his weight is down it is down 8 pounds in our office.  He is planning to go to [place] to see [place] and then will be traveling to [place] and [place] for work each.  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  He is worried with the travel that he will get off track.  He is committed to staying active and keeping his weight down.  He is worried about the diet changes he will have with traveling internationally.  He is going to closely monitor his intake    Mr. Shehadeh reports that he has been exercising  daily  He does not currently check blood pressures     He has been checking morning weights; his weight has been stable.  Samuel Koch has been following a fluid restriction of 2000 mL per day better and has restricting dietary sodium content to no more than 2 gm/day.    He reports that he has been compliant with his medications.      Review of Systems:  Review of Systems   Constitutional:  Positive for malaise/fatigue. Negative for chills, fever and weight loss.   Respiratory:  Positive for shortness of breath. Negative for cough.    Cardiovascular:  Negative for chest pain, palpitations and leg swelling.   Gastrointestinal:  Negative for abdominal pain, blood in stool, constipation, diarrhea, nausea and vomiting.   Genitourinary:  Negative for dysuria and hematuria.   Neurological:  Positive for dizziness.    Comprehensive 12 point review of system is negative unless described above in HPI    Review of Cardiovascular Testing:  EKG (07/19/2020):  sinus rhythm. Ventricular rate 87.     PMH:  1. Chronic combined systolic and diastolic CHF   A. RHC 06/08/19- wedge pressure 16, RB pressure 33/7. Pulmonary artery pressure 30/12    2. Nonischemic cardiomyopathy  A. LHC (6/17): 30% mid RCA, no other significant disease.   B. Echo (6/17): EF 30-35%.   C. Holter (7/17): 19% PVCs.   D. Echo (3/18): EF 50-55%, moderate LVH, grade II diastolic dysfunction.   E. Echo (9/18): EF 50-55%.   F. Echo 10/07/17- LV cavity size is increased. The LVEF is est. at 50%. Mild hypokinesis of the distal septum and apex which may be due to paced rhythm. Pacer wire present in RV.   G. Echo 05/22/18- EF 50-55%. Grade II DD. Mitral valve leaflets mildly thickened without stenosis. Trace MR. Trace TR. Estimated pulmonary arterial pressure is 25-30 mmHg.   H. Echo 06/12/2018- Mild concentric LVH, EF 50-55%, aortic root is mildy dilated, aortic root is 3.9cm, tricuspid regurgitation is mild, RSVP .   I. Echo 08/13/19- EF 50-55%. Grade I diastolic dysfunction  of the left ventricle (impaired relaxation pattern). The left ventricular ejection fraction is low normal, estimated at 50- 55%. RV mildly dilated in size with normal systolic function. A pacer wire is present in RV. No significant valvular heart disease or evidence for pulmonary hypertension.     3. Acute Coronary Syndrome   A. Echo 03/27/2018- Technically difficult study with limited endocardial definition and structural resolution in the apical views, Intravenous contrast was administered to enhance endocardial definition, LV cavity size is norma, mild concentric left ventricular hypertrophy, LV systolic function is at the lower limits of normal, EF 50%, no regional wall motion abnormalities present, LV diastolic filling pattern is probably pseudonormalized, consistent with grade 2 left ventricular diastolic dysfunction and elevated left atrial pressure, Right-sided chambers are not optimally visualized but appear grossly normal in size, Pacemaker lead is visualized within the right heart chamber, no significant valvular heart disease.   B. Stress 03/28/2018- Completed vasodilator protocol., No ischemic changes with infusion, No arrhythmias, Overall, low risk vasodilator stress rest SPECT perfusion study, a large moderate to severe perfusion defect involving the apex, inferior stripe, mid and basal inferoseptal and inferolateral segments with mild reversibility suggestive of possible ischemia but with likely strong component of diaphragmatic attenuation artifact, Also anteroseptal perfusion defect from attenuation artifact, No significant findings of ischemia Noted, Dilated LV with mildly decreased left ventricular function, stress EF 44%.   C. THC 03/30/2018- Negative heart cath. False positive stress test.     4. Bradycardia/ SSS   A. 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 [facility])     5. PVCs   A. 48 hour holter (7/17) with 19% PVCs.   B. PVC ablation in 8/17 but PVCs recurred.    C. Holter (11/17) with rare PVCs, < 1%.   D. Holter (9/18) with rare PVCs only. 6. NSVT   7. Type II diabetes   8. Hypertriglyceridemia   9.  Gout      Past Surgical History:   has a past surgical history that includes APPENDECTOMY (OPEN); Cardiac pacemaker placement; Right & Left Heart Cath  w/ Coronary Angios, LV/LA (Right, 03/30/2018); and Right Heart Cath (Right, 06/08/2019).    Family History:  family history includes Diabetes in his brother and paternal grandmother; Heart attack in his sister; Heart disease in his brother, father, and sister; Hypertension in his father; No known problems in his mother.  Married    Social History:   reports that he has quit smoking. He has quit using smokeless tobacco. He reports that he does not drink alcohol and does not use drugs.    Allergies:  has No Known Allergies.  Medications:    Current Outpatient Medications:     Blood Glucose Monitor, generic, Kit, Use as directed to monitor blood sugar, Disp: 1 kit, Rfl: 1    Blood Glucose Monitoring Suppl (OneTouch Ultra 2) w/Device Kit, USE AS DIRECTED TO MONITOR BLOOD SUGAR, Disp: , Rfl:     bumetanide (BUMEX) 2 MG tablet, Take 2 tablets (4 mg) by mouth every morning AND 1 tablet (2 mg) Daily after lunch. May also take 1 tablet (2 mg) as needed (weight gain of 2 lbs overnight or 5 lbs in 1 week)., Disp: 360 tablet, Rfl: 0    Cholecalciferol (vitamin D3) 25 MCG (1000 UT) Cap, Take 1,000 Units by mouth daily, Disp: , Rfl:     Continuous Blood Gluc Sensor (FreeStyle Libre 2 Sensor Systm) Misc, 1 Device by Does not apply route every 14 (fourteen) days, Disp: 2 each, Rfl: 12    empagliflozin (Jardiance) 10 MG tablet, Take 1 tablet (10 mg total) by mouth every morning, Disp: 30 tablet, Rfl: 2    glucose blood test strip, Check BS 3 times daily, Disp: 270 each, Rfl: 3    insulin glargine (LANTUS SOLOSTAR) 100 UNIT/ML injection pen, 36 units twice a day, Disp: 75 mL, Rfl: 3    insulin lispro, 1 Unit Dial, (HumaLOG) 100 UNIT/ML Solution  Pen-injector injection pen, INJECT 14 UNITS PLUS SLIDING SCALE 2:50>150 THREE TIMES DAILY BEFORE MEALS. Max 75 U daily., Disp: 75 mL, Rfl: 3    Insulin Pen Needle (Pen Needles 3/16") 31G X 5 MM Misc, 1 each 5 times per day, Disp: 450 each, Rfl: 3    Insulin Syringes, Disposable, U-100 1 ML Misc, 1 each by Does not apply route 2 (two) times daily, Disp: 180 each, Rfl: 0    Lancets Ultra Fine Misc, Check BS 5 times daily, Disp: 150 each, Rfl: 11    lisinopril (ZESTRIL) 5 MG tablet, Take 1 tablet (5 mg total) by mouth daily, Disp: 90 tablet, Rfl: 3    metFORMIN (GLUCOPHAGE) 500 MG tablet, Take 1,000 mg by mouth 2 (two) times daily with meals, Disp: , Rfl:     metOLazone (ZAROXOLYN) 5 MG tablet, Take 2.5 mg by mouth as needed (weight gain of greater than 5 lbs in 1 week), Disp: , Rfl:     potassium chloride (K-TAB) 10 MEQ CR tablet, Take 1 tablet (10 mEq) by mouth daily, Disp: 90 tablet, Rfl: 3    Semaglutide, 1 MG/DOSE, (Ozempic, 1 MG/DOSE,) 2 MG/1.5ML Solution Pen-injector, Inject 1 mg into the skin once a week, Disp: 9 mL, Rfl: 3    spironolactone (ALDACTONE) 25 MG tablet, Take 1 tablet (25 mg total) by mouth daily, Disp: 90 tablet, Rfl: 3       Objective     Visit Vitals  BP 128/88   Pulse 88   Wt 145 kg (319 lb 9.6 oz)   SpO2 97%   BMI 41.03 kg/m       Wt Readings from Last 7 Encounters:   02/13/21 145 kg (319 lb 9.6 oz)   01/16/21 148.7 kg (327 lb 13.2 oz)   01/04/21 156.8 kg (345 lb 9.6 oz)   01/04/21 156.5 kg (345 lb)   12/29/20 152.9 kg (337 lb)   12/05/20 153.4 kg (338 lb 2 oz)   11/29/20 158.1 kg (348 lb 9.6 oz)       Physical Exam:   General: well-developed male, alert, NAD  HEENT:  Anicteric sclera  Neck:  no JVD  Lungs:  bilateral breath sounds clear; no rhonchi, wheezes or crackles with normal respiratory effort  Cardiovascular: s1s2, normal rate, no m/r/g  Abdominal: protuberant; soft; non-tender  Extremities: skin warm and dry; no edema noted lower extremities/feet  Neuromuscular exam:  MAEW. Rises from  chair independently; ambulates without difficulty.   Skin: no rash or ulceration.      Laboratory Data:  Lab Results   Component Value Date/Time    NA 142 05/21/2020 08:27 PM    K 3.8 05/21/2020 08:27 PM    CL 108 05/21/2020 08:27 PM    CO2 21 05/21/2020 08:27 PM    CA 9.9 05/21/2020 08:27 PM    GLU 115 (H) 05/21/2020 08:27 PM    CREAT 1.40 (H) 01/16/2021 09:03 AM    CREAT 1.13 05/21/2020 08:27 PM    BUN 19 05/21/2020 08:27 PM    PROT 7.1 12/26/2018 06:20 PM    ALB 4.2 12/26/2018 06:20 PM    ALKPHOS 67 12/26/2018 06:20 PM    ALT 24 12/26/2018 06:20 PM    AST 15 12/26/2018 06:20 PM    BILITOTAL 0.3 12/26/2018 06:20 PM    AGRATIO 1.45 12/26/2018 06:20 PM    ANIONGAP 16.8 05/21/2020 08:27 PM    BUNCRRATIO 16.8 05/21/2020 08:27 PM    EGFR 60 01/16/2021 09:03 AM    OSMO 286 05/21/2020 08:27 PM    GLOB 2.9 12/26/2018 06:20 PM         Additional Non-ischemic w/u panel:  Lab Results   Component Value Date/Time    TSH 0.98 03/08/2020 10:59 AM    T4FREE Not Indicated 03/08/2020 10:59 AM       No results found for: HIVAGAB    No results found for: HCVAB, HBV, HEPBCOREAB     Lab Results   Component Value Date/Time    WBC 8.9 05/21/2020 08:27 PM    RBC 4.34 05/21/2020 08:27 PM    HGB 13.4 05/21/2020 08:27 PM    HCT 39.8 05/21/2020 08:27 PM    PLT 307 05/21/2020 08:27 PM     Lab Results   Component Value Date/Time    CHOL 206 (H) 05/01/2020 08:17 AM    TRIG 574 (HH) 05/01/2020 08:17 AM    TRIG 228 (H) 03/26/2018 04:24 PM    HDL 36 (L) 05/01/2020 08:17 AM    LDL 79 05/01/2020 08:17 AM     Lab Results   Component Value Date/Time    HGBA1CPERCNT 8.2 03/26/2018 04:24 PM     Lab Results   Component Value Date/Time    BNP 10.2 05/21/2020 08:27 PM              Today's I-stat Labs:  Results       Procedure Component Value Units Date/Time    i-Stat Chem 8 CartrIDge [[id number]]  (Abnormal) Collected: 02/13/21 1025    Specimen: Blood Updated: 02/13/21 1028     Sodium I-Stat 141 mMol/L      Potassium I-Stat 3.3 mMol/L      Chloride I-Stat  100 mMol/L      TCO2 I-Stat 28 mMol/L      Calcium Ionized I-Stat 4.50 mg/dL      Glucose I-Stat 90 mg/dL      Creatinine I-Stat 1.40 mg/dL      BUN I-Stat 25 mg/dL      Anion Gap I-Stat 17.0     EGFR 60 mL/min/1.27m2      Hematocrit I-Stat 52.0 %      Hemoglobin I-Stat 17.7 gm/dL  Assessment and Plan:  1. HFrEF (heart failure with reduced ejection fraction)    2. NICM (nonischemic cardiomyopathy)    3. Essential hypertension    4. S/P placement of cardiac pacemaker    5. Type 2 diabetes mellitus treated with insulin    6. SSS (sick sinus syndrome)    7. PVC (premature ventricular contraction)    8. History of cardiomyopathy         My impression is that, Mr. Topel has a stage C, nonischemic cardiomyopathy and currently reports NYHA Class III level of symptoms.      he  is well compensated and well perfused on exam. he does not have evidence of end-organ hypoperfusion based on labs and clinical exam.     We will be monitoring closely for changes in heart failure symptoms, electrolyte abnormalities and arrhythmias.    In regards to GDMT:  Beta-blocker: no -does not tolerate; has extreme fatigue  ACE-I/ARB/ARNI:yes; Lisinopril 5 mg daily  Aldosterone antagonist:yes; Aldactone 25 mg once a day  Hydralazine/nitrate: no   Digoxin: no    SGLT2i:  yes; Jardiance 10 mg daily    Med Changes and plan:  -Continue maintenance dose of diuretics; continue Bumex 4 mg each morning and 2 mg at lunch.  May take additional Bumex 2 mg as needed for weight gain of 2 or more pounds overnight or 5 pounds in one week  -serum creatinine stable today: 1.4.  serum potassium today: 3.3.  increase KDur 20 mEq daily.   Follow up in clinic in 4-8 weeks; sooner if needed  Has device check scheduled 04/04/21  Follow up with cardiology 07/05/21    Devices - CRT-D:   he  is not a candidate for cardiac resynchronization therapy.    No ICD indicated as EF >35-40%, therefore he is not a candidate for ICD therapy.    Has PPM in place followed  by [facility]    Diuretics: as noted above    Exercise :  he  is not a candidate for cardiac rehabilitation.     Counseling:  -I have encouraged pt to continue to follow a <2gm/day Na restriction and emphasized limit fluid intake to < 2L/day.  -I have requested pt perform daily wts and contact our office for increase of >2-3lbs in 1 day or 5lbs in 1 week.   -I have encouraged pt to engage in aerobic activity as tolerated and avoid heavy weight lifting.   -Smoking/Alcohol: I have advised pt to abstain from any tobacco use and excessive alcohol intake.    -Weight: Obesity is a significant risk for both short and long-term morbidity and mortality for AHF patients.  I have stressed the importance of weight control and dietary management.      Our office is available to answer any questions you or the patient may have. We appreciate the opportunity to participate in the care of your patients.      Electronically signed by:    Deatra James. Georg Ruddle, FNP-C  Clinical Coordinator [facility]  [facility]  [facility]  [address]  [place], [place] [address]  [phone]   [phone] - fax         Note: This chart was generated by the [facility] EMR system/speech recognition and may contain inherit omission or errors not intended by the user.  Grammatical errors, random word insertions, deletions, pronoun errors and incomplete sentences are occasionally consequences of this technology  due to software limitations.  Not all errors are caught or corrected.  If there are questions or concerns about the content of this note or information contained in the body of this dictation they should be addressed directly with the author for clarification.

## 2021-02-13 NOTE — Patient Instructions (Signed)
Increase potassium 10 mEq 2 tablets daily.     Continue all other medications.

## 2021-02-19 ENCOUNTER — Other Ambulatory Visit: Payer: Self-pay | Admitting: Family

## 2021-02-19 ENCOUNTER — Other Ambulatory Visit: Payer: Self-pay | Admitting: Physician Assistant

## 2021-02-19 DIAGNOSIS — Z794 Long term (current) use of insulin: Secondary | ICD-10-CM

## 2021-02-19 DIAGNOSIS — I5032 Chronic diastolic (congestive) heart failure: Secondary | ICD-10-CM

## 2021-02-19 MED ORDER — LISINOPRIL 5 MG PO TABS
5.0000 mg | ORAL_TABLET | Freq: Every day | ORAL | 3 refills | Status: AC
Start: 2021-02-19 — End: ?

## 2021-02-19 NOTE — Telephone Encounter (Signed)
PT needs the following refills - PT is requesting 90 days       lisinopril (ZESTRIL) 5 MG tablet   empagliflozin (Jardiance) 10 MG tablet

## 2021-02-19 NOTE — Telephone Encounter (Signed)
Refilled jardiance and lisinopril

## 2021-02-22 ENCOUNTER — Emergency Department
Admission: EM | Admit: 2021-02-22 | Discharge: 2021-02-22 | Disposition: A | Discharge status: Home / Self Care | Payer: 59

## 2021-02-22 ENCOUNTER — Emergency Department: Payer: 59

## 2021-02-22 DIAGNOSIS — S90811A Abrasion, right foot, initial encounter: Secondary | ICD-10-CM | POA: Insufficient documentation

## 2021-02-22 DIAGNOSIS — I509 Heart failure, unspecified: Secondary | ICD-10-CM | POA: Insufficient documentation

## 2021-02-22 DIAGNOSIS — S90821A Blister (nonthermal), right foot, initial encounter: Secondary | ICD-10-CM | POA: Insufficient documentation

## 2021-02-22 DIAGNOSIS — E1142 Type 2 diabetes mellitus with diabetic polyneuropathy: Secondary | ICD-10-CM | POA: Insufficient documentation

## 2021-02-22 DIAGNOSIS — R269 Unspecified abnormalities of gait and mobility: Secondary | ICD-10-CM | POA: Insufficient documentation

## 2021-02-22 DIAGNOSIS — Y93A1 Activity, exercise machines primarily for cardiorespiratory conditioning: Secondary | ICD-10-CM | POA: Insufficient documentation

## 2021-02-22 DIAGNOSIS — Z20822 Contact with and (suspected) exposure to covid-19: Secondary | ICD-10-CM | POA: Insufficient documentation

## 2021-02-22 DIAGNOSIS — L84 Corns and callosities: Secondary | ICD-10-CM | POA: Insufficient documentation

## 2021-02-22 DIAGNOSIS — Z79899 Other long term (current) drug therapy: Secondary | ICD-10-CM | POA: Insufficient documentation

## 2021-02-22 DIAGNOSIS — Z794 Long term (current) use of insulin: Secondary | ICD-10-CM | POA: Insufficient documentation

## 2021-02-22 DIAGNOSIS — W3189XA Contact with other specified machinery, initial encounter: Secondary | ICD-10-CM | POA: Insufficient documentation

## 2021-02-22 DIAGNOSIS — I110 Hypertensive heart disease with heart failure: Secondary | ICD-10-CM | POA: Insufficient documentation

## 2021-02-22 LAB — CBC AND DIFFERENTIAL
Basophils %: 0.6 % (ref 0.0–3.0)
Basophils Absolute: 0.1 10*3/uL (ref 0.0–0.3)
Eosinophils %: 0.8 % (ref 0.0–7.0)
Eosinophils Absolute: 0.1 10*3/uL (ref 0.0–0.8)
Hematocrit: 45.3 % (ref 39.0–52.5)
Hemoglobin: 14.6 gm/dL (ref 13.0–17.5)
Lymphocytes Absolute: 2.2 10*3/uL (ref 0.6–5.1)
Lymphocytes: 24.5 % (ref 15.0–46.0)
MCH: 30 pg (ref 28–35)
MCHC: 32 gm/dL (ref 32–36)
MCV: 92 fL (ref 80–100)
MPV: 8.7 fL (ref 6.0–10.0)
Monocytes Absolute: 0.7 10*3/uL (ref 0.1–1.7)
Monocytes: 7.8 % (ref 3.0–15.0)
Neutrophils %: 66.3 % (ref 42.0–78.0)
Neutrophils Absolute: 5.9 10*3/uL (ref 1.7–8.6)
PLT CT: 268 10*3/uL (ref 130–440)
RBC: 4.95 10*6/uL (ref 4.00–5.70)
RDW: 12.1 % (ref 11.0–14.0)
WBC: 8.9 10*3/uL (ref 4.0–11.0)

## 2021-02-22 LAB — I-STAT LACTIC ACID
Lactic Acid I-Stat: 1.4 mMol/L (ref 0.5–1.9)
Room Number I-Stat: 51

## 2021-02-22 LAB — VH CULTURE-BLOOD-VENIPUNCTURE

## 2021-02-22 LAB — BASIC METABOLIC PANEL
Anion Gap: 17.9 mMol/L (ref 7.0–18.0)
BUN / Creatinine Ratio: 24.7 Ratio (ref 10.0–30.0)
BUN: 39 mg/dL — ABNORMAL HIGH (ref 7–22)
CO2: 26 mMol/L (ref 20–30)
Calcium: 9.7 mg/dL (ref 8.5–10.5)
Chloride: 104 mMol/L (ref 98–110)
Creatinine: 1.58 mg/dL — ABNORMAL HIGH (ref 0.80–1.30)
EGFR: 52 mL/min/{1.73_m2} — ABNORMAL LOW (ref 60–150)
Glucose: 120 mg/dL — ABNORMAL HIGH (ref 71–99)
Osmolality Calculated: 297 mOsm/kg (ref 275–300)
Potassium: 3.9 mMol/L (ref 3.5–5.3)
Sodium: 144 mMol/L (ref 136–147)

## 2021-02-22 LAB — VH XPERT XPRESS © COV-2/FLU/RSV PLUS
Does patient reside in a congregate care setting?: NEGATIVE
Influenza A RNA: NEGATIVE
Influenza B RNA: NEGATIVE
Is patient employed in a healthcare setting?: NEGATIVE
Is the patient pregnant?: NEGATIVE
RSV RNA: NEGATIVE
SARS-CoV-2 RNA: NEGATIVE

## 2021-02-22 LAB — C-REACTIVE PROTEIN: C-Reactive Protein: 0.65 mg/dL (ref 0.02–0.80)

## 2021-02-22 LAB — SEDIMENTATION RATE: Sed Rate: 33 mm/hr — ABNORMAL HIGH (ref 0–20)

## 2021-02-22 LAB — VH I-STAT LACTIC ACID NOTIFICATION

## 2021-02-22 LAB — VH EXTRA SPECIMEN LABEL

## 2021-02-22 MED ORDER — HYDROCODONE-ACETAMINOPHEN 5-325 MG PO TABS
ORAL_TABLET | ORAL | Status: AC
Start: 2021-02-22 — End: ?
  Filled 2021-02-22: qty 1

## 2021-02-22 MED ORDER — KETOROLAC TROMETHAMINE 15 MG/ML IJ SOLN
15.0000 mg | Freq: Once | INTRAMUSCULAR | Status: DC
Start: 2021-02-22 — End: 2021-02-22

## 2021-02-22 MED ORDER — MORPHINE SULFATE 4 MG/ML IJ/IV SOLN (WRAP)
Status: AC
Start: 2021-02-22 — End: ?
  Filled 2021-02-22: qty 1

## 2021-02-22 MED ORDER — HYDROCODONE-ACETAMINOPHEN 5-325 MG PO TABS
1.0000 | ORAL_TABLET | Freq: Once | ORAL | Status: AC
Start: 2021-02-22 — End: 2021-02-22
  Administered 2021-02-22: 1

## 2021-02-22 MED ORDER — CEPHALEXIN 500 MG PO CAPS
500.0000 mg | ORAL_CAPSULE | Freq: Two times a day (BID) | ORAL | 0 refills | Status: AC
Start: 2021-02-22 — End: 2021-03-01

## 2021-02-22 MED ORDER — LET SOLUTION
TOPICAL | Status: AC
Start: 2021-02-22 — End: ?
  Filled 2021-02-22: qty 6

## 2021-02-22 MED ORDER — MORPHINE SULFATE 4 MG/ML IJ/IV SOLN (WRAP)
2.0000 mg | Freq: Once | Status: AC
Start: 2021-02-22 — End: 2021-02-22
  Administered 2021-02-22: 2 mg via INTRAVENOUS

## 2021-02-22 MED ORDER — VH SODIUM CHLORIDE 0.9 % IV BOLUS
500.0000 mL | Freq: Once | INTRAVENOUS | Status: AC
Start: 2021-02-22 — End: 2021-02-22
  Administered 2021-02-22: 500 mL via INTRAVENOUS

## 2021-02-22 NOTE — ED Provider Notes (Shared)
[facility]  EMERGENCY DEPARTMENT  History and Physical Exam     Patient Name: Samuel Koch, Samuel Koch  Encounter Date:  02/22/2021  Physician Assistant: Ellin Mayhew, PA-C  Attending Physician: Dunn, [place] B, MD  Patient DOB:  02-Sep-1970  MRN:  [id number]  Room:  E51/E51-A  PCP: Edmon Crape, MD      Diagnosis/Disposition:  MDM:     Final Impression  No diagnosis found.    Disposition  ED Disposition       None            Follow up  No follow-up provider specified.    Prescriptions  New Prescriptions    No medications on file       Differential Diagnosis:    The patient was evaluated in the Emergency Department for ***.  Diagnostic considerations included, but were not limited to: ***.      At this time, the history, physical exam, and objective data support a low suspicion of concern for ***.     History obtained from:{DP HPI HX:26576}    Past Medical Records reviewed: {Records Reviewed:59200}    Other Medical Conditions that Impact Care: ***    Differential Diagnosis Considered: ***    All tests were independently reviewed and interpreted by myself and my attending physician. The significant findings are discussed in ED course below      ED Course:    The clinical impression and plan have been discussed with the patient and/or the patient's family.  Patient education provided on ***.  All questions have been answered.  Patient encouraged to follow-up with ***.  Patient and/or the patient's family voiced understanding of return precautions.      Procedures: {YES Including:59203::"No"} See Procedure Section below for procedure note for details    Meds Given In ED: (Boldface indicate High Risk Medications that Required Monitoring for adverse effects or deterioration:  Medications   sodium chloride 0.9 % bolus 500 mL (has no administration in time range)   ketorolac (TORADOL) injection 15 mg (has no administration in time range)       Discussion about de-escalation of care or Code Status: {YES  Including:59203::"No"}    Discussions regarding hospitalization or transfer: {YES Including:59203::"No"}    Discussion with Consultants: {YES Including:59203::"No"}    Prescription Management: See Diagnosis/Disposition area for Prescription Management details    Social Determinants of Health that Impact Care: {Social Determinants:59201::"None that negatively impact care"}      Consults:    I discussed this case with Dunn, [place] B, MD in the emergency department who also directly examined the patient and is in agreement with the assessment and treatment plan.              History of Presenting Illness:     Nurse Triage: c/o Rt foot wound today, state dit happened when he was using a threadmill today. hx of DM. ambuates with crutches    Chief complaint: Wound Check    HPI/ROS given by: patient    Samuel Koch is a 51 y.o. 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 tach, who presents with 4-hour history of right foot pain.  Patient reports that he was walking 4 miles on the treadmill this morning, then noticed a pain in his right foot.  He was wearing new inserts in his shoes and he notes that the  skin on the bottom of his right foot had peeled back.  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  Denies any bleeding purulent discharge.  Patient denies any fever, but did have days, chills and body aches 2 days ago.  He notes that his grandchildren were over on day 1/16 and he did have colds.  He denies any history of osteomyelitis or septic joint.  He notes that his blood glucose has been running around the high 100s, but was in the 70s 2 days ago.  He notes his normal blood glucose is 150-180, 200 after eating.  She denies any increase in leg swelling.  Denies any decreased exercise  tolerance, dyspnea, chest pain, palpitations.  He has been trying to exercise more frequently and has been trying to go to the gym several times per week.  Denies any tobacco use or alcohol use.        Review of Systems:  Physical Exam:     Review of Systems    Blood pressure 152/84, pulse 87, temperature 97.2 F (36.2 C), temperature source Temporal, resp. rate 18, weight 147 kg, SpO2 100 %.     Physical Exam  Cardiovascular:      Pulses:           Dorsalis pedis pulses are 2+ on the right side and 2+ on the left side.        Posterior tibial pulses are 2+ on the right side and 2+ on the left side.   Musculoskeletal:      Right lower leg: No edema.      Left lower leg: No edema.      Right foot: Normal range of motion. No deformity, bunion, Charcot foot, foot drop or prominent metatarsal heads.   Feet:      Right foot:      Skin integrity: Blister, skin breakdown and erythema present. No ulcer, warmth, callus, dry skin or fissure.      Toenail Condition: Right toenails are normal.      Left foot:      Skin integrity: No ulcer, blister, skin breakdown, erythema, warmth, callus, dry skin or fissure.      Toenail Condition: Left toenails are normal.   Neurological:      Motor: No weakness.      Gait: Gait abnormal (Antalgic gait favoring right leg due to foot pain.).      Comments: Decreased sensation bilateral fingertips and bilateral toes due to neuropathy.            Diagnostic Results:     RADIOLOGIC STUDIES    All images have been personally viewed by me    No results found.      LAB STUDIES    All lab value have been personally reviewed by me    Labs Reviewed   VH XPERT XPRESS  COV-2/FLU/RSV PLUS   VH CULTURE AND SMEAR, WOUND   VH CULTURE-BLOOD-VENIPUNCTURE   CBC AND DIFFERENTIAL   BASIC METABOLIC PANEL   C-REACTIVE PROTEIN   SEDIMENTATION RATE   VH I-STAT LACTIC ACID NOTIFICATION   VH EXTRA SPECIMEN LABEL           EKG:     EKG:   EKG (interpreted by ED physician):   Last EKG Result       None                  PROCEDURES  Procedures       ORDERS PLACED THIS VISIT     Orders  Orders Placed This Encounter   Procedures    Respiratory Specimen Xpert Xpress  CoV-2 / Flu / RSV Plus    Wound Culture and Smear    Blood Culture - Venipuncture    XR Foot Right    CBC and differential    Basic Metabolic Panel    C Reactive Protein    Sedimentation rate (ESR)    Lactic Acid I-Stat    Collect Blood    Saline lock IV       Medications  Medications   sodium chloride 0.9 % bolus 500 mL (has no administration in time range)   ketorolac (TORADOL) injection 15 mg (has no administration in time range)              Allergies & Medications:     Patient has No Known Allergies.    Current/Home Medications    BLOOD GLUCOSE MONITOR, GENERIC, KIT    Use as directed to monitor blood sugar    BLOOD GLUCOSE MONITORING SUPPL (ONETOUCH ULTRA 2) W/DEVICE KIT    USE AS DIRECTED TO MONITOR BLOOD SUGAR    BUMETANIDE (BUMEX) 2 MG TABLET    Take 2 tablets (4 mg) by mouth every morning AND 1 tablet (2 mg) Daily after lunch. May also take 1 tablet (2 mg) as needed (weight gain of 2 lbs overnight or 5 lbs in 1 week).    CHOLECALCIFEROL (VITAMIN D3) 25 MCG (1000 UT) CAP    Take 1,000 Units by mouth daily    CONTINUOUS BLOOD GLUC SENSOR (FREESTYLE LIBRE 2 SENSOR SYSTM) MISC    1 Device by Does not apply route every 14 (fourteen) days    EMPAGLIFLOZIN (JARDIANCE) 10 MG TABLET    TAKE 1 TABLET BY MOUTH ONCE DAILY IN THE MORNING    GLUCOSE BLOOD TEST STRIP    Check BS 3 times daily    INSULIN GLARGINE (LANTUS SOLOSTAR) 100 UNIT/ML INJECTION PEN    36 units twice a day    INSULIN LISPRO, 1 UNIT DIAL, (HUMALOG) 100 UNIT/ML SOLUTION PEN-INJECTOR INJECTION PEN    INJECT 14 UNITS PLUS SLIDING SCALE 2:50>150 THREE TIMES DAILY BEFORE MEALS. Max 75 U daily.    INSULIN PEN NEEDLE (PEN NEEDLES 3/16") 31G X 5 MM MISC    1 each 5 times per day    INSULIN SYRINGES, DISPOSABLE, U-100 1 ML MISC    1 each by Does not apply route 2 (two) times daily    LANCETS ULTRA FINE MISC     Check BS 5 times daily    LISINOPRIL (ZESTRIL) 5 MG TABLET    Take 1 tablet (5 mg) by mouth daily    METFORMIN (GLUCOPHAGE) 500 MG TABLET    Take 1,000 mg by mouth 2 (two) times daily with meals    METOLAZONE (ZAROXOLYN) 5 MG TABLET    Take 2.5 mg by mouth as needed (weight gain of greater than 5 lbs in 1 week)    POTASSIUM CHLORIDE (K-TAB) 10 MEQ CR TABLET    Take 1 tablet (10 mEq) by mouth 2 (two) times daily    SEMAGLUTIDE, 1 MG/DOSE, (OZEMPIC, 1 MG/DOSE,) 2 MG/1.5ML SOLUTION PEN-INJECTOR    Inject 1 mg into the skin once a week    SPIRONOLACTONE (ALDACTONE) 25 MG TABLET    Take 1 tablet (25 mg total) by mouth daily  Past History:     Medical: Patient  has a past medical history of Cardiomyopathy, Congestive heart failure, Diabetes mellitus, Hypertension, and PVC's (premature ventricular contractions).    Surgical: Patient  has a past surgical history that includes APPENDECTOMY (OPEN); Cardiac pacemaker placement; Right & Left Heart Cath  w/ Coronary Angios, LV/LA (Right, 03/30/2018); and Right Heart Cath (Right, 06/08/2019).    Family: The family history includes Diabetes in his brother and paternal grandmother; Heart attack in his sister; Heart disease in his brother, father, and sister; Hypertension in his father; No known problems in his mother.    Social: Patient  reports that he has quit smoking. He has quit using smokeless tobacco. He reports that he does not drink alcohol and does not use drugs.        ATTESTATIONS     Ellin Mayhew, PA    The patient's nursing/triage note and past medical records, including those in Care Everywhere when necessary, were reviewed by myself.  The results of diagnostic studies, available past medical, family, social, and surgical histories have also been reviewed by myself.     Note:  This chart was generated by an EMR and may contain errors, including typographical, or omissions not intended by the user.  This chart was generated by the Epic EMR system/speech  recognition and may contain inherent errors or omissions not intended by the user. Grammatical errors, random word insertions, deletions, pronoun errors and incomplete sentences are occasional consequences of this technology due to software limitations. Not all errors are caught or corrected. If there are questions or concerns about the content of this note or information contained within the body of this dictation they should be addressed directly with the author for clarification.

## 2021-02-22 NOTE — Discharge Instructions (Addendum)
Diabetic foot abrasion discharge instructions: You should leave the bandage for 24 hours, or until it becomes soiled. Bandage may be removed and the skin should be cleaned with soap and warm water, then gently pat the area dry. You should reapply the antibiotic pad (Xeroform) or OTC antibiotic ointment, then replace bandage and keep the area covered until the abrasion heals. You may take OTC Tylenol 325-650 mg every 6 hours as needed for pain (maximum 2500 mg daily). Take antibiotics to completion. Return to ED for increasing pain, discharge, bleeding, swelling, redness, red streak up affected extremity, fever, or any acute concerns. You should avoid direct weight-bearing/pressure or physical activity until the wound has healed. Follow-up with Podiatry within 1 week, ambulatory referral provided.

## 2021-04-03 ENCOUNTER — Ambulatory Visit: Payer: Self-pay | Admitting: Family

## 2021-04-10 ENCOUNTER — Telehealth: Payer: Self-pay | Admitting: Family

## 2021-04-10 DIAGNOSIS — I10 Essential (primary) hypertension: Secondary | ICD-10-CM

## 2021-04-10 DIAGNOSIS — I5020 Unspecified systolic (congestive) heart failure: Secondary | ICD-10-CM

## 2021-04-10 DIAGNOSIS — I428 Other cardiomyopathies: Secondary | ICD-10-CM

## 2021-04-10 MED ORDER — BUMETANIDE 2 MG PO TABS
ORAL_TABLET | ORAL | 0 refills | Status: DC
Start: 2021-04-10 — End: 2021-08-17

## 2021-04-10 NOTE — Telephone Encounter (Signed)
Incoming refill request for Bumex from patient's [employer].  Refill was sent as requested

## 2021-04-26 ENCOUNTER — Emergency Department
Admission: EM | Admit: 2021-04-26 | Discharge: 2021-04-26 | Disposition: A | Discharge status: Home / Self Care | Payer: 59 | Attending: Emergency Medicine | Admitting: Emergency Medicine

## 2021-04-26 DIAGNOSIS — E119 Type 2 diabetes mellitus without complications: Secondary | ICD-10-CM | POA: Insufficient documentation

## 2021-04-26 DIAGNOSIS — I509 Heart failure, unspecified: Secondary | ICD-10-CM | POA: Insufficient documentation

## 2021-04-26 DIAGNOSIS — Z20822 Contact with and (suspected) exposure to covid-19: Secondary | ICD-10-CM | POA: Insufficient documentation

## 2021-04-26 DIAGNOSIS — I10 Essential (primary) hypertension: Secondary | ICD-10-CM

## 2021-04-26 DIAGNOSIS — Z87891 Personal history of nicotine dependence: Secondary | ICD-10-CM | POA: Insufficient documentation

## 2021-04-26 DIAGNOSIS — M109 Gout, unspecified: Secondary | ICD-10-CM | POA: Insufficient documentation

## 2021-04-26 DIAGNOSIS — L0211 Cutaneous abscess of neck: Secondary | ICD-10-CM | POA: Insufficient documentation

## 2021-04-26 DIAGNOSIS — M79661 Pain in right lower leg: Secondary | ICD-10-CM | POA: Insufficient documentation

## 2021-04-26 DIAGNOSIS — R110 Nausea: Secondary | ICD-10-CM | POA: Insufficient documentation

## 2021-04-26 DIAGNOSIS — R6883 Chills (without fever): Secondary | ICD-10-CM | POA: Insufficient documentation

## 2021-04-26 DIAGNOSIS — I110 Hypertensive heart disease with heart failure: Secondary | ICD-10-CM | POA: Insufficient documentation

## 2021-04-26 LAB — VH EXTRA SPECIMEN LABEL

## 2021-04-26 LAB — COMPREHENSIVE METABOLIC PANEL
ALT: 22 U/L (ref 0–55)
AST (SGOT): 15 U/L (ref 10–42)
Albumin/Globulin Ratio: 0.92 Ratio (ref 0.80–2.00)
Albumin: 3.3 gm/dL — ABNORMAL LOW (ref 3.5–5.0)
Alkaline Phosphatase: 85 U/L (ref 40–145)
Anion Gap: 11.2 mMol/L (ref 7.0–18.0)
BUN / Creatinine Ratio: 17.9 Ratio (ref 10.0–30.0)
BUN: 19 mg/dL (ref 7–22)
Bilirubin, Total: 0.5 mg/dL (ref 0.1–1.2)
CO2: 23 mMol/L (ref 20–30)
Calcium: 9.1 mg/dL (ref 8.5–10.5)
Chloride: 104 mMol/L (ref 98–110)
Creatinine: 1.06 mg/dL (ref 0.80–1.30)
EGFR: 84 mL/min/{1.73_m2} (ref 60–150)
Globulin: 3.6 gm/dL (ref 2.0–4.0)
Glucose: 177 mg/dL — ABNORMAL HIGH (ref 71–99)
Osmolality Calculated: 275 mOsm/kg (ref 275–300)
Potassium: 4.2 mMol/L (ref 3.5–5.3)
Protein, Total: 6.9 gm/dL (ref 6.0–8.3)
Sodium: 134 mMol/L — ABNORMAL LOW (ref 136–147)

## 2021-04-26 LAB — CBC AND DIFFERENTIAL
Basophils %: 0.6 % (ref 0.0–3.0)
Basophils Absolute: 0.1 10*3/uL (ref 0.0–0.3)
Eosinophils %: 1.4 % (ref 0.0–7.0)
Eosinophils Absolute: 0.2 10*3/uL (ref 0.0–0.8)
Hematocrit: 44.4 % (ref 39.0–52.5)
Hemoglobin: 14.8 gm/dL (ref 13.0–17.5)
Lymphocytes Absolute: 2.2 10*3/uL (ref 0.6–5.1)
Lymphocytes: 19.9 % (ref 15.0–46.0)
MCH: 30 pg (ref 28–35)
MCHC: 33 gm/dL (ref 32–36)
MCV: 89 fL (ref 80–100)
MPV: 8.3 fL (ref 6.0–10.0)
Monocytes Absolute: 1.3 10*3/uL (ref 0.1–1.7)
Monocytes: 12.1 % (ref 3.0–15.0)
Neutrophils %: 65.9 % (ref 42.0–78.0)
Neutrophils Absolute: 7.2 10*3/uL (ref 1.7–8.6)
PLT CT: 269 10*3/uL (ref 130–440)
RBC: 5.02 10*6/uL (ref 4.00–5.70)
RDW: 12.4 % (ref 11.0–14.0)
WBC: 10.9 10*3/uL (ref 4.0–11.0)

## 2021-04-26 LAB — VH XPERT XPRESS © COV-2/FLU/RSV PLUS
Date of Onset: 20230320
Does patient reside in a congregate care setting?: NEGATIVE
Influenza A RNA: NEGATIVE
Influenza B RNA: NEGATIVE
Is patient employed in a healthcare setting?: NEGATIVE
Is the patient pregnant?: NEGATIVE
RSV RNA: NEGATIVE
SARS-CoV-2 RNA: NEGATIVE

## 2021-04-26 LAB — URIC ACID: Uric acid: 6.8 mg/dL (ref 3.5–7.2)

## 2021-04-26 MED ORDER — COLCHICINE 0.6 MG PO TABS
ORAL_TABLET | ORAL | Status: AC
Start: 2021-04-26 — End: ?
  Filled 2021-04-26: qty 2

## 2021-04-26 MED ORDER — METHYLPREDNISOLONE SODIUM SUCC 40 MG IJ SOLR
60.0000 mg | Freq: Once | INTRAMUSCULAR | Status: AC
Start: 2021-04-26 — End: 2021-04-26
  Administered 2021-04-26: 08:00:00 60 mg via INTRAVENOUS

## 2021-04-26 MED ORDER — MORPHINE SULFATE 4 MG/ML IJ/IV SOLN (WRAP)
4.0000 mg | Freq: Once | Status: AC
Start: 2021-04-26 — End: 2021-04-26
  Administered 2021-04-26: 08:00:00 4 mg via INTRAVENOUS

## 2021-04-26 MED ORDER — DOXYCYCLINE MONOHYDRATE 100 MG PO CAPS
100.0000 mg | ORAL_CAPSULE | Freq: Two times a day (BID) | ORAL | 0 refills | Status: AC
Start: 2021-04-26 — End: 2021-05-06

## 2021-04-26 MED ORDER — COLCHICINE 0.6 MG PO TABS
1.2000 mg | ORAL_TABLET | Freq: Once | ORAL | Status: AC
Start: 2021-04-26 — End: 2021-04-26
  Administered 2021-04-26: 09:00:00 1.2 mg via ORAL

## 2021-04-26 MED ORDER — LIDOCAINE 5 % EX PTCH
1.0000 | MEDICATED_PATCH | CUTANEOUS | 0 refills | Status: DC
Start: 2021-04-26 — End: 2021-07-10

## 2021-04-26 MED ORDER — PREDNISONE 20 MG PO TABS
20.0000 mg | ORAL_TABLET | Freq: Every day | ORAL | 0 refills | Status: AC
Start: 2021-04-26 — End: 2021-05-01

## 2021-04-26 MED ORDER — METHYLPREDNISOLONE SODIUM SUCC 40 MG IJ SOLR
INTRAMUSCULAR | Status: AC
Start: 2021-04-26 — End: ?
  Filled 2021-04-26: qty 2

## 2021-04-26 MED ORDER — ACETAMINOPHEN 10 MG/ML IV SOLN
INTRAVENOUS | Status: AC
Start: 2021-04-26 — End: ?
  Filled 2021-04-26: qty 100

## 2021-04-26 MED ORDER — MORPHINE SULFATE 4 MG/ML IJ/IV SOLN (WRAP)
Status: AC
Start: 2021-04-26 — End: ?
  Filled 2021-04-26: qty 1

## 2021-04-26 MED ORDER — TRAMADOL HCL 50 MG PO TABS
50.0000 mg | ORAL_TABLET | Freq: Three times a day (TID) | ORAL | 0 refills | Status: AC | PRN
Start: 2021-04-26 — End: 2021-05-03

## 2021-04-26 MED ORDER — ACETAMINOPHEN 10 MG/ML IV SOLN
1000.0000 mg | Freq: Once | INTRAVENOUS | Status: AC
Start: 2021-04-26 — End: 2021-04-26
  Administered 2021-04-26: 09:00:00 1000 mg via INTRAVENOUS

## 2021-04-26 MED ORDER — COLCHICINE 0.6 MG PO TABS
0.6000 mg | ORAL_TABLET | Freq: Every day | ORAL | 0 refills | Status: DC
Start: 2021-04-26 — End: 2021-07-03

## 2021-04-26 NOTE — Discharge Instructions (Addendum)
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  Do not take allopurinol until your gout flare has resolved.  We have prescribed medications for you to take, please take them as directed.  You were given a second course of colchicine if you have another acute flare before you are able to see your primary doctor.  You may also take acetaminophen (Tylenol) 975 mg every 8 hours.  Please do not consume alcohol if you are going to take Tylenol.  We have also prescribed an antibiotic for your abscess/boil, please take it as directed.  Please follow-up with your primary care provider if your symptoms persist or your abscess does not decrease in size.  In the meantime, if you develop persistent severe worsening pain, chills/fever, intractable nausea/vomiting differentiated taking your medications, elevated blood sugar greater than 250 on multiple readings despite taking your medicines and drinking fluids, increasing abscess, or any other symptoms that are alarming to you, please return to the ER for reevaluation promptly.  Thank you!

## 2021-04-26 NOTE — ED Triage Notes (Signed)
Patient comes to the emergency department today because he is having bilateral foot pain.  He saw his doctor recently who told him to have a uric acid checked to see if he had gout, but the patient did not.  He was placed on a course of prednisone and allopurinol.  He completed the prednisone three days ago and is having continued pain.  He has some mild swelling on the left medial malleolus.  He has redness to the top of his right foot.  It is tender to touch.  He says that he has also had some chills recently, but never took his temperature.  He has a "boil" to the posterior aspect of his scalp.  It is approximately the size of a 50 cent piece.  It is hard, reddened and tender to touch.  He states he noticed that yesterday.  He is a diabetic, has a history of CHF and hypertension.

## 2021-04-26 NOTE — ED Provider Notes (Signed)
EMERGENCY DEPARTMENT  History and Physical Exam       Patient Name: Samuel Koch, Samuel Koch  Encounter Date:  04/26/2021  Attending Physician: Tammi Klippel, DO.  PCP: Edmon Crape, MD  Patient DOB:  1970-04-11  MRN:  [id number]  Room:  S22/S22-A    History obtained from:Patient    Past Medical Records reviewed: Previous [facility] ED Notes, Previous Radiology Images and Report, Care Everywhere Notes, and Outpatient Clinic notes    Other Medical Conditions that Impact Care: Hypertension, obesity, type 2 diabetes, chronic diastolic heart failure, sick sinus syndrome, nonischemic cardiomyopathy, chronic kidney disease, history of gout    Differential Diagnosis Considered: Gout, cellulitis, necrotizing soft tissue infection, septic arthritis, abscess versus boil on back of neck amongst others    All tests were independently reviewed and interpreted by myself. The significant findings are as follows:    Lab Results:  Results       Procedure Component Value Units Date/Time    Respiratory Specimen Xpert Xpress  CoV-2 / Flu / RSV Plus [[id number]] Collected: 04/26/21 0752    Specimen: Nasopharyngeal Swab Updated: 04/26/21 4782     Influenza A RNA Negative     Influenza B RNA Negative     RSV RNA Negative     SARS-CoV-2 RNA Negative     Does patient have symptoms related to condition of interest? Y     Date of Onset [id number]     Is patient employed in a healthcare setting? N     Does patient reside in a congregate care setting? N     Is the patient pregnant? N    Narrative:      Specimen source - Nasopharyngeal Swab     Influenza A tests are unable to distinguish between novel and seasonal influenza A.     A negative result for either Influenza A or B does not exclude influenza virus infection. Clinical correlation required.     All positive influenza tests (A or B) require placement of patient on droplet precaution isolation.    CBC and differential [[id number]] Collected: 04/26/21 0749    Specimen: Blood Updated: 04/26/21 0845      WBC 10.9 K/cmm      RBC 5.02 M/cmm      Hemoglobin 14.8 gm/dL      Hematocrit 95.2 %      MCV 89 fL      MCH 30 pg      MCHC 33 gm/dL      RDW 84.1 %      PLT CT 269 K/cmm      MPV 8.3 fL      Neutrophils % 65.9 %      Lymphocytes 19.9 %      Monocytes 12.1 %      Eosinophils % 1.4 %      Basophils % 0.6 %      Neutrophils Absolute 7.2 K/cmm      Lymphocytes Absolute 2.2 K/cmm      Monocytes Absolute 1.3 K/cmm      Eosinophils Absolute 0.2 K/cmm      Basophils Absolute 0.1 K/cmm     Uric acid [[id number]] Collected: 04/26/21 0752    Specimen: Plasma Updated: 04/26/21 0830     Uric acid 6.8 mg/dL     Comprehensive metabolic panel [[id number]]  (Abnormal) Collected: 04/26/21 0752    Specimen: Plasma Updated: 04/26/21 0830     Sodium 134 mMol/L      Potassium 4.2 mMol/L  Chloride 104 mMol/L      CO2 23 mMol/L      Calcium 9.1 mg/dL      Glucose 161 mg/dL      Creatinine 0.96 mg/dL      BUN 19 mg/dL      Protein, Total 6.9 gm/dL      Albumin 3.3 gm/dL      Alkaline Phosphatase 85 U/L      ALT 22 U/L      AST (SGOT) 15 U/L      Bilirubin, Total 0.5 mg/dL      Albumin/Globulin Ratio 0.92 Ratio      Anion Gap 11.2 mMol/L      BUN / Creatinine Ratio 17.9 Ratio      EGFR 84 mL/min/1.80m2      Osmolality Calculated 275 mOsm/kg      Globulin 3.6 gm/dL     Collect Blood [[id number]] Collected: 04/26/21 0752    Specimen: Other Updated: 04/26/21 0801     Collect Blood Label Notification            Radiology Results:  No results found.     Procedures: No     Meds Given In ED: (Boldface indicate High Risk Medications that Required Monitoring for adverse effects or deterioration:  Medications   colchicine tablet 1.2 mg (1.2 mg Oral Given 04/26/21 0835)   morphine injection 4 mg (4 mg Intravenous Given 04/26/21 0826)   methylPREDNISolone sodium succinate (Solu-MEDROL) injection 60 mg (60 mg Intravenous Given 04/26/21 0825)   acetaminophen (TYLENOL/OFIRMEV) injection 1,000 mg (0 mg Intravenous Stopped 04/26/21 0946)       Discussion  about de-escalation of care or Code Status: No    Discussions regarding hospitalization or transfer: No    Discussion with Consultants: No    ED Course as of 04/26/21 1130   Thu Apr 26, 2021   0915 Serum studies reviewed remarkable for mild hyperglycemia without evidence of concomitant DKA, but no anemia to suggest life-threatening hemorrhage, leukocytosis/left shift to suggest a significant systemic infection, other metabolic disturbance, evidence of current renal dysfunction, or significantly elevated uric acid. [JK]   1112 Patient's pain improved, albeit still present on re-examination. [JK]      ED Course User Index  [JK] Haynes Bast Araceli Bouche, DO        Narrative: The patient had an elevated blood pressure, but was otherwise hemodynamically stable on initial presentation with an exam as detailed below notable for an alert, aseptic/nontoxic-appearing male with an edematous and tender to palpation right foot compared to the left foot and an area of induration to the back of his neck less than 1 cm was mildly fluctuant.  His differential diagnosis was explored.  His serum studies were reviewed as detailed above notable in ED course; no concern for infection uric acid level within normal limits.  He tested negative for COVID/influenza/RSV.  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  After receiving acetaminophen IV, colchicine, prednisone IV, and morphine IV while in the emergency room, his pain was improved, albeit still present.  Given the above findings, felt that the patient was safe to discharge with an increased pain regimen.  He was counseled not to take allopurinol during an acute gout flare.  He was also given an antibiotic for his neck abscess as it was not of a size that is amenable to I&D and he also deferred offered I&D while in the ER today.  Follow-up instructions and return precautions were included in his  discharge paperwork.    Prescription Management:    New Meds:   New Prescriptions     COLCHICINE 0.6 MG TABLET    Take 1 tablet (0.6 mg) by mouth daily Take 1 tablet starting tomorrow. Do not take more than 5 days in a row. If you need to take for another acute flare, take 2 tablets on Day followed by 1 tablet on day 2-5.    DOXYCYCLINE (MONODOX) 100 MG CAPSULE    Take 1 capsule (100 mg) by mouth 2 (two) times daily for 10 days    LIDOCAINE (LIDODERM) 5 %    Place 1 patch onto the skin every 24 hours Remove & Discard patch within 12 hours or as directed by MD    PREDNISONE (DELTASONE) 20 MG TABLET    Take 1 tablet (20 mg) by mouth daily for 5 days    TRAMADOL (ULTRAM) 50 MG TABLET    Take 1-2 tablets (50-100 mg) by mouth every 8 (eight) hours as needed for Pain (moderate pain)        Medications Changed:   Modified Medications    No medications on file       Discontinued Meds:   Discontinued Medications    METFORMIN (GLUCOPHAGE) 500 MG TABLET    Take 1,000 mg by mouth 2 (two) times daily with meals         Social Determinants of Health that Impact Care: None that negatively impact care      Diagnosis / Disposition     Clinical Impression  1. Gouty arthropathy    2. Abscess, neck    3. Elevated blood pressure reading with diagnosis of hypertension    4. History of tobacco use        Disposition  ED Disposition       ED Disposition   Discharge    Condition   --    Date/Time   Thu Apr 26, 2021 11:27 AM    Comment   Patricia Nettle Landstrom discharge to home/self care.    Condition at disposition: Stable                 Follow up for Discharged Patients  Edmon Crape, MD  [address]  [place] [address]  [phone]      As needed                History of Presenting Illness     Chief complaint: Foot Pain    HPI/ROS is limited by: none  HPI/ROS given by: Patient    Fayez Sturgell is a 51 y.o. male with diastolic CHF, recurrent gout of lower extremities, diabetes, and chronic kidney disease who presents to the ED with complaints of worsening right lower extremity pain, redness, and swelling is gotten  acutely worse over the last week after seeing his primary care provider who diagnosed him with a likely gout flare and placed him on a prednisone burst and acetaminophen, as well as allopurinol.  Patient started his allopurinol for the gout flare was completely resolved.  He now also has pain in his left foot.  He rates the pain as severe, sharp, 10/10, localizing it to his bilateral ankles and right foot without radiation elsewhere. His pain is unrelieved with his medications and worse with walking. He also endorses concomitant chills, nausea, but no chest pain/tightness, cough, difficulty breathing/shortness of breath, headache, rash, or vomiting.  He is also complaining of a boil to the back of  his neck that started yesterday (not present during his PCM visit)       Review of Systems   Review of Systems   Constitutional:  Positive for chills. Negative for fever.   HENT:  Negative for congestion, sore throat and trouble swallowing.    Respiratory:  Negative for cough, shortness of breath and wheezing.    Cardiovascular:  Negative for chest pain and palpitations.   Gastrointestinal:  Positive for nausea. Negative for abdominal pain, diarrhea and vomiting.   Genitourinary:  Negative for dysuria and hematuria.   Musculoskeletal:  Positive for arthralgias (bilateral ankles right > left) and myalgias (right foot). Negative for back pain.        + neck "boil"     Skin:  Negative for rash.   Neurological:  Negative for dizziness, weakness, light-headedness, numbness and headaches.   Hematological:  Negative for adenopathy.   All other systems reviewed and are negative.       Other pertinent ROS findings in HPI.  Allergies & Medications     Pt has No Known Allergies.    Current/Home Medications    BLOOD GLUCOSE MONITOR, GENERIC, KIT    Use as directed to monitor blood sugar    BLOOD GLUCOSE MONITORING SUPPL (ONETOUCH ULTRA 2) W/DEVICE KIT    USE AS DIRECTED TO MONITOR BLOOD SUGAR    BUMETANIDE (BUMEX) 2 MG TABLET    Take  2 tablets (4 mg) by mouth every morning AND 1 tablet (2 mg) Daily after lunch. May also take 1 tablet (2 mg) as needed (weight gain of 2 lbs overnight or 5 lbs in 1 week).    CHOLECALCIFEROL (VITAMIN D3) 25 MCG (1000 UT) CAP    Take 1,000 Units by mouth daily    CONTINUOUS BLOOD GLUC SENSOR (FREESTYLE LIBRE 2 SENSOR SYSTM) MISC    1 Device by Does not apply route every 14 (fourteen) days    EMPAGLIFLOZIN (JARDIANCE) 10 MG TABLET    TAKE 1 TABLET BY MOUTH ONCE DAILY IN THE MORNING    GLUCOSE BLOOD TEST STRIP    Check BS 3 times daily    INSULIN GLARGINE (LANTUS SOLOSTAR) 100 UNIT/ML INJECTION PEN    36 units twice a day    INSULIN LISPRO, 1 UNIT DIAL, (HUMALOG) 100 UNIT/ML SOLUTION PEN-INJECTOR INJECTION PEN    INJECT 14 UNITS PLUS SLIDING SCALE 2:50>150 THREE TIMES DAILY BEFORE MEALS. Max 75 U daily.    INSULIN PEN NEEDLE (PEN NEEDLES 3/16") 31G X 5 MM MISC    1 each 5 times per day    INSULIN SYRINGES, DISPOSABLE, U-100 1 ML MISC    1 each by Does not apply route 2 (two) times daily    LANCETS ULTRA FINE MISC    Check BS 5 times daily    LISINOPRIL (ZESTRIL) 5 MG TABLET    Take 1 tablet (5 mg) by mouth daily    METOLAZONE (ZAROXOLYN) 5 MG TABLET    Take 2.5 mg by mouth as needed (weight gain of greater than 5 lbs in 1 week)    POTASSIUM CHLORIDE (K-TAB) 10 MEQ CR TABLET    Take 1 tablet (10 mEq) by mouth 2 (two) times daily    SEMAGLUTIDE, 1 MG/DOSE, (OZEMPIC, 1 MG/DOSE,) 2 MG/1.5ML SOLUTION PEN-INJECTOR    Inject 1 mg into the skin once a week    SPIRONOLACTONE (ALDACTONE) 25 MG TABLET    Take 1 tablet (25 mg total) by mouth daily  Past Medical History     Pt   Past Medical History:   Diagnosis Date    Cardiomyopathy     Congestive heart failure     Diabetes mellitus     Hypertension     PVC's (premature ventricular contractions)         Past Surgical History     Pt   Past Surgical History:   Procedure Laterality Date    APPENDECTOMY (OPEN)      CARDIAC PACEMAKER PLACEMENT      RIGHT & LEFT HEART CATH  W/  CORONARY ANGIOS, LV/LA Right 03/30/2018    Procedure: RIGHT & LEFT HEART CATH  W/ CORONARY ANGIOS, LV/LA;  Surgeon: Santo Held, MD;  [facility];  Service: Cardiovascular;  Laterality: Right;    RIGHT HEART CATH Right 06/08/2019    Procedure: RIGHT HEART CATH;  Surgeon: Adonis Brook, MD;  Location: [facility] CATH/EP;  Service: Cardiovascular;  Laterality: Right;  ARRIVAL TIME        Family History     The family history includes Diabetes in his brother and paternal grandmother; Heart attack in his sister; Heart disease in his brother, father, and sister; Hypertension in his father; No known problems in his mother.     Social History     Pt reports that he has quit smoking. He has quit using smokeless tobacco. He reports that he does not drink alcohol and does not use drugs.     Physical Exam     Blood pressure 126/75, pulse 68, temperature 97.1 F (36.2 C), temperature source Skin, resp. rate 22, SpO2 96 %.    Vitals signs reviewed.    Physical Exam  Vitals and nursing note reviewed.   Constitutional:       General: He is not in acute distress.     Appearance: Normal appearance. He is normal weight. He is not ill-appearing or toxic-appearing.   HENT:      Head: Normocephalic and atraumatic.      Right Ear: External ear normal.      Left Ear: External ear normal.      Nose: Nose normal.   Eyes:      Extraocular Movements: Extraocular movements intact.      Conjunctiva/sclera: Conjunctivae normal.   Neck:      Comments: + <1 cm area of induration at base of occiput on left that is fluctuant    Cardiovascular:      Rate and Rhythm: Normal rate.      Pulses: Normal pulses.   Pulmonary:      Effort: Pulmonary effort is normal.      Breath sounds: No wheezing.   Musculoskeletal:         General: Tenderness (palpation of ventral right foot) present.      Cervical back: Normal range of motion and neck supple.      Right lower leg: Edema (ventral right foot) present.   Skin:     General: Skin is warm.       Findings: Erythema (ventral right foot) present.   Neurological:      General: No focal deficit present.      Mental Status: He is alert and oriented to person, place, and time.   Psychiatric:         Mood and Affect: Mood normal.         Behavior: Behavior normal.            Procedures  None        Note:  This chart was generated by the Epic EMR system/speech recognition and may contain inherent errors or omissions not intended by the user. Grammatical errors, random word insertions, deletions, pronoun errors and incomplete sentences are occasional consequences of this technology due to software limitations. Not all errors are caught or corrected. If there are questions or concerns about the content of this note or information contained within the body of this dictation they should be addressed directly with the author for clarification        Bud Face, DO  04/26/21 1130

## 2021-04-30 ENCOUNTER — Telehealth: Payer: Self-pay

## 2021-04-30 NOTE — Telephone Encounter (Signed)
In review of his chart, I sent a refill for his Bumex for a 90 day supply on 04/10/21.  Please ask him to call his pharmacy to obtain the prescription refill

## 2021-04-30 NOTE — Telephone Encounter (Signed)
PT called stating that he needs a refill of the following     bumetanide (BUMEX) 2 MG tablet    PT states he did call the pharmacy and they stated that they did fax over a request.

## 2021-04-30 NOTE — Telephone Encounter (Signed)
Spoke with PT letting him know that the refill was sent over to the pharmacy on 3/7.  Asked pt going forward to please call the pharmacy prior to calling the [facility] and ask that they send over a refill request.

## 2021-05-10 ENCOUNTER — Telehealth (INDEPENDENT_AMBULATORY_CARE_PROVIDER_SITE_OTHER): Payer: Self-pay

## 2021-05-10 ENCOUNTER — Other Ambulatory Visit
Admission: RE | Admit: 2021-05-10 | Discharge: 2021-05-10 | Disposition: A | Discharge status: Home / Self Care | Payer: 59 | Source: Ambulatory Visit | Attending: Registered Nurse | Admitting: Registered Nurse

## 2021-05-10 DIAGNOSIS — E1165 Type 2 diabetes mellitus with hyperglycemia: Secondary | ICD-10-CM

## 2021-05-10 LAB — HEMOGLOBIN A1C
Estimated Average Glucose: 192 mg/dL
Hgb A1C, %: 8.3 %

## 2021-05-10 NOTE — Telephone Encounter (Signed)
Notified pt of seeing rhonda's office. He verbalized understanding.

## 2021-05-10 NOTE — Progress Notes (Signed)
A1c 8.3,  goal less than 7.    He will need to schedule appointment with Birdie Hopes, PA for endocrine follow up.

## 2021-05-10 NOTE — Telephone Encounter (Signed)
-----   Message from Jimmy Picket, FNP sent at 05/10/2021  3:05 PM EDT -----  A1c 8.3,  goal less than 7.    He will need to schedule appointment with Birdie Hopes, PA for endocrine follow up.

## 2021-06-01 ENCOUNTER — Other Ambulatory Visit (INDEPENDENT_AMBULATORY_CARE_PROVIDER_SITE_OTHER): Payer: Self-pay | Admitting: Registered Nurse

## 2021-06-22 ENCOUNTER — Other Ambulatory Visit (INDEPENDENT_AMBULATORY_CARE_PROVIDER_SITE_OTHER): Payer: Self-pay | Admitting: Registered Nurse

## 2021-06-22 DIAGNOSIS — E1165 Type 2 diabetes mellitus with hyperglycemia: Secondary | ICD-10-CM

## 2021-06-27 ENCOUNTER — Telehealth: Payer: Self-pay

## 2021-06-27 NOTE — Telephone Encounter (Signed)
Requested records from pcp. Updated ce. Upcoming appt on 07/05/21

## 2021-07-03 ENCOUNTER — Emergency Department
Admission: EM | Admit: 2021-07-03 | Discharge: 2021-07-03 | Disposition: A | Discharge status: Home / Self Care | Payer: 59 | Attending: Emergency Medicine | Admitting: Emergency Medicine

## 2021-07-03 DIAGNOSIS — R2242 Localized swelling, mass and lump, left lower limb: Secondary | ICD-10-CM | POA: Insufficient documentation

## 2021-07-03 DIAGNOSIS — M25572 Pain in left ankle and joints of left foot: Secondary | ICD-10-CM | POA: Insufficient documentation

## 2021-07-03 DIAGNOSIS — M109 Gout, unspecified: Secondary | ICD-10-CM | POA: Insufficient documentation

## 2021-07-03 HISTORY — DX: Gout, unspecified: M10.9

## 2021-07-03 MED ORDER — COLCHICINE 0.6 MG PO TABS
1.2000 mg | ORAL_TABLET | Freq: Every day | ORAL | Status: DC
Start: 2021-07-03 — End: 2021-07-03
  Administered 2021-07-03: 1.2 mg via ORAL

## 2021-07-03 MED ORDER — HYDROCODONE-ACETAMINOPHEN 5-325 MG PO TABS
ORAL_TABLET | ORAL | Status: AC
Start: 2021-07-03 — End: ?
  Filled 2021-07-03: qty 2

## 2021-07-03 MED ORDER — HYDROCODONE-ACETAMINOPHEN 5-325 MG PO TABS
2.0000 | ORAL_TABLET | Freq: Once | ORAL | Status: AC
Start: 2021-07-03 — End: 2021-07-03
  Administered 2021-07-03: 2 via ORAL

## 2021-07-03 MED ORDER — KETOROLAC TROMETHAMINE 30 MG/ML IJ SOLN
INTRAMUSCULAR | Status: AC
Start: 2021-07-03 — End: ?
  Filled 2021-07-03: qty 2

## 2021-07-03 MED ORDER — COLCHICINE 0.6 MG PO TABS
ORAL_TABLET | ORAL | Status: AC
Start: 2021-07-03 — End: ?
  Filled 2021-07-03: qty 2

## 2021-07-03 MED ORDER — HYDROCODONE-ACETAMINOPHEN 5-325 MG PO TABS
1.0000 | ORAL_TABLET | Freq: Four times a day (QID) | ORAL | 0 refills | Status: DC | PRN
Start: 2021-07-03 — End: 2021-07-10

## 2021-07-03 MED ORDER — KETOROLAC TROMETHAMINE 30 MG/ML IJ SOLN
60.0000 mg | Freq: Once | INTRAMUSCULAR | Status: AC
Start: 2021-07-03 — End: 2021-07-03
  Administered 2021-07-03: 60 mg via INTRAMUSCULAR

## 2021-07-03 MED ORDER — COLCHICINE 0.6 MG PO TABS
0.6000 mg | ORAL_TABLET | Freq: Every day | ORAL | 0 refills | Status: DC
Start: 2021-07-03 — End: 2021-07-10

## 2021-07-03 NOTE — ED Provider Notes (Signed)
[facility]  EMERGENCY DEPARTMENT  History and Physical Exam     Patient Name: Samuel Koch, Samuel Koch  Encounter Date:  07/03/2021  Attending Physician: Retta Diones, MD  Room:  C17/C17-A  Patient DOB:  06-23-1970  Age: 51 y.o. male  MRN:  [id number]  PCP: Edmon Crape, MD      Diagnosis/Disposition:  MDM:     Final Impression  1. Gouty arthritis        Disposition  ED Disposition       ED Disposition   Discharge    Condition   --    Date/Time   Tue Jul 03, 2021  7:03 AM    Comment   Patricia Nettle Manasco discharge to home/self care.    Condition at disposition: Stable                 Follow up  Edmon Crape, MD  [address]  [place] [address]  [phone]    Schedule an appointment as soon as possible for a visit       [facility] Emergency Department  [address]  [place] [address]  [phone]    If symptoms worsen      PRESCRIPTION MANAGEMENT    New Meds:   New Prescriptions    COLCHICINE 0.6 MG TABLET    Take 1 tablet (0.6 mg) by mouth daily for 1 day Take 1 tablet in 2 hours    HYDROCODONE-ACETAMINOPHEN (NORCO) 5-325 MG PER TABLET    Take 1-2 tablets by mouth every 6 (six) hours as needed for Pain        Medications Changed:   Modified Medications    No medications on file       Discontinued Meds:   Discontinued Medications    COLCHICINE 0.6 MG TABLET    Take 1 tablet (0.6 mg) by mouth daily Take 1 tablet starting tomorrow. Do not take more than 5 days in a row. If you need to take for another acute flare, take 2 tablets on Day followed by 1 tablet on day 2-5.         Chief complaint: Ankle Pain and Foot Pain      Differential Diagnosis Considered: Gout, cellulitis, arthritis flare, ankle sprain           DISCUSSION:  History of gout, afebrile.  We will treat with colchicine.  We will treat with analgesics.  Follow-up with PCP.  Return for worsening symptoms.      I did offer crutches to the patient.  States he has crutches in the car     Meds Given In ED: (Red Colored Meds indicate High Risk  Medications that Required Monitoring for adverse effects or deterioration:    Medications   ketorolac (TORADOL) injection 60 mg (has no administration in time range)   HYDROcodone-acetaminophen (NORCO) 5-325 MG per tablet 2 tablet (has no administration in time range)   colchicine tablet 1.2 mg (has no administration in time range)                 History of Presenting Illness:     Nursing Triage note: Pt reports pain & swelling in L ankle & L lateral foot. Pain started yesterday and worsening throughout the night. Pt reports being unable to bear weight and sensitive to even light touch. Pt reports hx of gout and this feels the same. Denies any recent falls, injuries or trauma. Pt reports taking Allopurinol regularly, outside of the settings  of a flare; did not take the last 2 days. Pt takes Bumex d/t hx of CHF. Pt also reports that he recently spent 20 hours on the road & was unable to elevate leg. Denies any hx of DVT/PE. Denies any swelling in the calf. Pt reports hx of DM & neuropathy.    Samuel Koch is a 51 y.o. male presenting with 1 day history of left foot and left ankle pain and swelling.  Patient states he has a history of gout.  States that he does not drink alcohol.  Denies fevers.  States the pain is severe.  Does not know of any alleviating factors.  There is erythema warmth and swelling of the left ankle and left dorsum of the foot.  He is unable to ambulate.  Patient states in the past steroids have not worked for him.  He does have a history of diabetes.  States the gout is attributed to the diuretics that he takes for his congestive heart failure      Physical Exam  MDM Complexity     Blood pressure 104/81, pulse 76, temperature 98.8 F (37.1 C), temperature source Oral, resp. rate 17, height 1.88 m, weight 145.2 kg, SpO2 97 %.     Physical Exam  Vitals and nursing note reviewed.   Constitutional:       Appearance: Normal appearance. He is not ill-appearing or diaphoretic.   HENT:       Head: Normocephalic and atraumatic.   Eyes:      Extraocular Movements: Extraocular movements intact.   Cardiovascular:      Rate and Rhythm: Normal rate.      Pulses: Normal pulses.   Pulmonary:      Effort: Pulmonary effort is normal.   Musculoskeletal:      Cervical back: Normal range of motion.      Comments: Left foot: There is swelling and tenderness to the lateral malleolus of the ankle.  There is erythema on the dorsum of the foot.  Mildly warm.  Pain with palpation and range of motion.  Adequate pulses.   Skin:     General: Skin is warm.      Coloration: Skin is not jaundiced.   Neurological:      General: No focal deficit present.      Mental Status: He is alert and oriented to person, place, and time.   Psychiatric:         Mood and Affect: Mood normal.          MDM Complexity:    History obtained from:Patient  HPI/ROS is limited by: none    Past Medical Records reviewed: Previous [place] ED Notes    Medical Conditions that Impact her Care Today:  CHF, diabetes, gout    Prescription Management: See Diagnosis/Disposition area for Prescription Management details    Social Determinants of Health that Impact Care: None that negatively impact care    Available past medical, family, social, and surgical histories have been reviewed by myself. The clinical impression and plan have been discussed with the patient and/or the patient's family. All questions have been answered.     Diagnostic Results:     LAB STUDIES    All lab value have been personally reviewed by me    Results       ** No results found for the last 24 hours. **            RADIOLOGIC STUDIES    All images have  been personally viewed by me    No results found.      EKG:     EKG:   Last EKG Result       None             PROCEDURES          ORDERS PLACED THIS VISIT     Orders  No orders of the defined types were placed in this encounter.             Allergies & Medications:     Pt has No Known Allergies.    Current/Home Medications    BLOOD  GLUCOSE MONITOR, GENERIC, KIT    Use as directed to monitor blood sugar    BLOOD GLUCOSE MONITORING SUPPL (ONETOUCH ULTRA 2) W/DEVICE KIT    USE AS DIRECTED TO MONITOR BLOOD SUGAR    BUMETANIDE (BUMEX) 2 MG TABLET    Take 2 tablets (4 mg) by mouth every morning AND 1 tablet (2 mg) Daily after lunch. May also take 1 tablet (2 mg) as needed (weight gain of 2 lbs overnight or 5 lbs in 1 week).    CHOLECALCIFEROL (VITAMIN D3) 25 MCG (1000 UT) CAP    Take 1,000 Units by mouth daily    CONTINUOUS BLOOD GLUC SENSOR (FREESTYLE LIBRE 2 SENSOR SYSTM) MISC    1 Device by Does not apply route every 14 (fourteen) days    EMPAGLIFLOZIN (JARDIANCE) 10 MG TABLET    TAKE 1 TABLET BY MOUTH ONCE DAILY IN THE MORNING    GLUCOSE BLOOD TEST STRIP    Check BS 3 times daily    INSULIN GLARGINE (LANTUS SOLOSTAR) 100 UNIT/ML INJECTION PEN    INJECT 36 UNITS SUBCUTANEOUSLY TWICE DAILY    INSULIN LISPRO, 1 UNIT DIAL, (HUMALOG) 100 UNIT/ML SOLUTION PEN-INJECTOR INJECTION PEN    INJECT 14 UNITS SUBCUTANEOUSLY PLUS SLIDING SCALE 2:50>150 THREE TIMES DAILY BEFORE MEALS **MAX OF 75 UNITS DAILY**    INSULIN PEN NEEDLE (PEN NEEDLES 3/16") 31G X 5 MM MISC    1 each 5 times per day    INSULIN SYRINGES, DISPOSABLE, U-100 1 ML MISC    1 each by Does not apply route 2 (two) times daily    LANCETS ULTRA FINE MISC    Check BS 5 times daily    LIDOCAINE (LIDODERM) 5 %    Place 1 patch onto the skin every 24 hours Remove & Discard patch within 12 hours or as directed by MD    LISINOPRIL (ZESTRIL) 5 MG TABLET    Take 1 tablet (5 mg) by mouth daily    METOLAZONE (ZAROXOLYN) 5 MG TABLET    Take 2.5 mg by mouth as needed (weight gain of greater than 5 lbs in 1 week)    POTASSIUM CHLORIDE (K-TAB) 10 MEQ CR TABLET    Take 1 tablet (10 mEq) by mouth 2 (two) times daily    SEMAGLUTIDE, 1 MG/DOSE, (OZEMPIC, 1 MG/DOSE,) 2 MG/1.5ML SOLUTION PEN-INJECTOR    Inject 1 mg into the skin once a week    SPIRONOLACTONE (ALDACTONE) 25 MG TABLET    Take 1 tablet (25 mg total) by  mouth daily           ATTESTATIONS     Retta Diones ,MD    The results of diagnostic studies have been reviewed by myself. The above past medical, family, social, and surgical histories have been reviewed by myself. The clinical impression and plan have been discussed with the  patient and/or the patient's family. All questions have been answered.    Note:  This chart was generated by an EMR and may contain errors, including typographical, or omissions not intended by the user. This chart was generated by the Epic EMR system/speech recognition and may contain inherent errors or omissions not intended by the user. Grammatical errors, random word insertions, deletions, pronoun errors and incomplete sentences are occasional consequences of this technology due to software limitations. Not all errors are caught or corrected. If there are questions or concerns about the content of this note or information contained within the body of this dictation they should be addressed directly with the author for clarification            Ermalene Postin, MD  07/03/21 [phone]

## 2021-07-04 NOTE — Telephone Encounter (Signed)
NOTE RECEIVED AND SCANNED TO 5.31.23 SCAN ONLY  LABS IN EPIC

## 2021-07-05 ENCOUNTER — Encounter: Payer: 59 | Admitting: Physician Assistant

## 2021-07-05 NOTE — Telephone Encounter (Signed)
Updates requested in Care Everywhere. Patient has upcoming appointment 07/10/2021.

## 2021-07-10 ENCOUNTER — Encounter: Payer: Self-pay | Admitting: Physician Assistant

## 2021-07-10 ENCOUNTER — Ambulatory Visit: Payer: 59 | Admitting: Physician Assistant

## 2021-07-10 VITALS — BP 140/90 | HR 88 | Ht 74.0 in | Wt 325.6 lb

## 2021-07-10 DIAGNOSIS — Z8679 Personal history of other diseases of the circulatory system: Secondary | ICD-10-CM

## 2021-07-10 DIAGNOSIS — Z950 Presence of cardiac pacemaker: Secondary | ICD-10-CM

## 2021-07-10 DIAGNOSIS — I493 Ventricular premature depolarization: Secondary | ICD-10-CM

## 2021-07-10 DIAGNOSIS — I495 Sick sinus syndrome: Secondary | ICD-10-CM

## 2021-07-10 MED ORDER — COLCHICINE 0.6 MG PO TABS
0.6000 mg | ORAL_TABLET | Freq: Two times a day (BID) | ORAL | 0 refills | Status: AC
Start: 2021-07-10 — End: 2021-07-18

## 2021-07-10 MED ORDER — METOPROLOL TARTRATE 25 MG PO TABS
12.5000 mg | ORAL_TABLET | ORAL | 0 refills | Status: AC | PRN
Start: 2021-07-10 — End: ?

## 2021-07-10 NOTE — Progress Notes (Signed)
Cardiology Follow-Up      Patient Name: Samuel Koch   Date of Birth: [birth year]    Provider: Magdalen Spatz, PA     Patient Care Team:  Edmon Crape, MD as PCP - General (Family Medicine)  Abel Presto, DO as Consulting Physician (Cardiology)  Elizbeth Squires, NP as Nurse Practitioner (Family Nurse Practitioner)  Joneen Roach, PA as Physician Assistant (Physician Assistant)    Chief Complaint: PVC (6 month f/u ), SSS, and Cardiomyopathy      Impression      1. PVC (premature ventricular contraction)    2. SSS (sick sinus syndrome)    3. Presence of permanent cardiac pacemaker    4. History of cardiomyopathy      Plan      1. PVCs: s/p PVC ablation in 2017. PVC burden less than 1% by last 48 hour holter. Palpitations intermittently, unable to tolerate daily suppression with BB/CCB.     2. Sinus node dysfunction: s/p dual chamber pacemaker (2017). Normal device function, continue routine device checks. Will need to schedule in-office check this month.      3. History of nonischemic cardiomyopathy EF recovered 55-60%, previously 30% in 2017. Followed by HF clinic. Stable.     PLAN:  Trial metoprolol tartrate 12.5 mg PRN for palpitations  Continue all other prescription medications  Report any worsening symptoms or questions  Follow-up in 6 months      History of Present Illness   Samuel Koch is a 51 y.o. male being seen today for follow-up.      He has a history hypertension, chronic combined systolic and diastolic CHF, NICM, sinus node dysfunction, type II diabetes, PVCs, and NSVT. S/p dual chamber pacemaker implant in 2017. S/p PVC ablation in 2017.      Intermittent awareness of palpitations lasting up to 15-20 minutes. Chest discomfort has improved over time, now short-lived. Breathing, weight, and peripheral edema stable. ED for gout. Denies any pre-syncope, or LOC. BP well controlled.     Past Medical History     PMH: 06/28/2021 - DG/CC  LOV: 01/04/2021 - CC  LABS: 04/26/2021 - In Epic  EKG:  NEEDS EKG      1. Chronic combined systolic and diastolic CHF   A. RHC 06/08/19- wedge pressure 16, RB pressure 33/7. Pulmonary artery pressure 30/12    2. Nonischemic cardiomyopathy   A. LHC (6/17): 30% mid RCA, no other significant disease.   B. Echo (6/17): EF 30-35%.   C. Holter (7/17): 19% PVCs.   D. Echo (3/18): EF 50-55%, moderate LVH, grade II diastolic dysfunction.   E. Echo (9/18): EF 50-55%.   F. Echo 10/07/17- LV cavity size is increased. The LVEF is est. at 50%. Mild hypokinesis of the distal septum and apex which may be due to paced rhythm. Pacer wire present in RV.   G. Echo 05/22/18- EF 50-55%. Grade II DD. Mitral valve leaflets mildly thickened without stenosis. Trace MR. Trace TR. Estimated pulmonary arterial pressure is 25-30 mmHg.   H. Echo 06/12/2018- Mild concentric LVH, EF 50-55%, aortic root is mildy dilated, aortic root is 3.9cm, tricuspid regurgitation is mild, RSVP .   I. Echo 08/13/19- EF 50-55%. Grade I diastolic dysfunction of the left ventricle (impaired relaxation pattern). The left ventricular ejection fraction is low normal, estimated at 50- 55%. RV mildly dilated in size with normal systolic function. A pacer wire is present in RV. No significant valvular heart disease or evidence for pulmonary  hypertension.     3. Acute Coronary Syndrome   A. Echo 03/27/2018- Technically difficult study with limited endocardial definition and structural resolution in the apical views, Intravenous contrast was administered to enhance endocardial definition, LV cavity size is norma, mild concentric left ventricular hypertrophy, LV systolic function is at the lower limits of normal, EF 50%, no regional wall motion abnormalities present, LV diastolic filling pattern is probably pseudonormalized, consistent with grade 2 left ventricular diastolic dysfunction and elevated left atrial pressure, Right-sided chambers are not optimally visualized but appear grossly normal in size, Pacemaker lead is visualized  within the right heart chamber, no significant valvular heart disease.   B. Stress 03/28/2018- Completed vasodilator protocol., No ischemic changes with infusion, No arrhythmias, Overall, low risk vasodilator stress rest SPECT perfusion study, a large moderate to severe perfusion defect involving the apex, inferior stripe, mid and basal inferoseptal and inferolateral segments with mild reversibility suggestive of possible ischemia but with likely strong component of diaphragmatic attenuation artifact, Also anteroseptal perfusion defect from attenuation artifact, No significant findings of ischemia Noted, Dilated LV with mildly decreased left ventricular function, stress EF 44%.   C. THC 03/30/2018- Negative heart cath. False positive stress test.     4. Bradycardia/ SSS   A. 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 [facility])     5. PVCs   A. 48 hour holter (7/17) with 19% PVCs.   B. PVC ablation in 8/17 but PVCs recurred.   C. Holter (11/17) with rare PVCs, < 1%.   D. Holter (9/18) with rare PVCs only. 6. NSVT   7. Type II diabetes   8. Hypertriglyceridemia    Allergies     has No Known Allergies.    Medications       Current Outpatient Medications:     allopurinol (ZYLOPRIM) 100 MG tablet, Take 1 tablet (100 mg) by mouth daily, Disp: , Rfl:     Blood Glucose Monitor, generic, Kit, Use as directed to monitor blood sugar, Disp: 1 kit, Rfl: 1    Blood Glucose Monitoring Suppl (OneTouch Ultra 2) w/Device Kit, USE AS DIRECTED TO MONITOR BLOOD SUGAR, Disp: , Rfl:     bumetanide (BUMEX) 2 MG tablet, Take 2 tablets (4 mg) by mouth every morning AND 1 tablet (2 mg) Daily after lunch. May also take 1 tablet (2 mg) as needed (weight gain of 2 lbs overnight or 5 lbs in 1 week)., Disp: 360 tablet, Rfl: 0    Cholecalciferol (vitamin D3) 25 MCG (1000 UT) Cap, Take 1 capsule (1,000 Units) by mouth daily, Disp: , Rfl:     Continuous Blood Gluc Sensor (FreeStyle Libre 2 Sensor Systm) Misc, 1 Device by  Does not apply route every 14 (fourteen) days, Disp: 2 each, Rfl: 12    empagliflozin (Jardiance) 10 MG tablet, TAKE 1 TABLET BY MOUTH ONCE DAILY IN THE MORNING, Disp: 90 tablet, Rfl: 3    glucose blood test strip, Check BS 3 times daily, Disp: 270 each, Rfl: 3    insulin glargine (LANTUS SOLOSTAR) 100 UNIT/ML injection pen, INJECT 36 UNITS SUBCUTANEOUSLY TWICE DAILY, Disp: 75 mL, Rfl: 1    insulin lispro, 1 Unit Dial, (HumaLOG) 100 UNIT/ML Solution Pen-injector injection pen, INJECT 14 UNITS SUBCUTANEOUSLY PLUS SLIDING SCALE 2:50>150 THREE TIMES DAILY BEFORE MEALS **MAX OF 75 UNITS DAILY**, Disp: 15 mL, Rfl: 0    Insulin Pen Needle (Pen Needles 3/16") 31G X 5 MM Misc, 1 each 5 times per day, Disp: 450 each, Rfl:  3    Insulin Syringes, Disposable, U-100 1 ML Misc, 1 each by Does not apply route 2 (two) times daily, Disp: 180 each, Rfl: 0    Lancets Ultra Fine Misc, Check BS 5 times daily, Disp: 150 each, Rfl: 11    lisinopril (ZESTRIL) 5 MG tablet, Take 1 tablet (5 mg) by mouth daily, Disp: 90 tablet, Rfl: 3    metOLazone (ZAROXOLYN) 5 MG tablet, Take 0.5 tablets (2.5 mg) by mouth as needed (weight gain of greater than 5 lbs in 1 week), Disp: , Rfl:     potassium chloride (K-TAB) 10 MEQ CR tablet, Take 1 tablet (10 mEq) by mouth 2 (two) times daily, Disp: 180 tablet, Rfl: 3    Semaglutide, 1 MG/DOSE, (Ozempic, 1 MG/DOSE,) 2 MG/1.5ML Solution Pen-injector, Inject 1 mg into the skin once a week, Disp: 9 mL, Rfl: 3    spironolactone (ALDACTONE) 25 MG tablet, Take 1 tablet (25 mg total) by mouth daily, Disp: 90 tablet, Rfl: 3    colchicine 0.6 MG tablet, Take 1 tablet (0.6 mg) by mouth 2 (two) times daily for 8 days, Disp: 16 tablet, Rfl: 0    metoprolol tartrate (LOPRESSOR) 25 MG tablet, Take 0.5 tablets (12.5 mg) by mouth as needed (palpitations), Disp: 45 tablet, Rfl: 0    Physical Exam   Visit Vitals  BP 140/90 (BP Site: Left arm, Patient Position: Sitting)   Pulse 88   Ht 1.88 m (6\' 2" )   Wt 147.7 kg (325 lb 9 oz)    BMI 41.80 kg/m     Vitals:    07/10/21 0916   BP: 140/90   BP Site: Left arm   Patient Position: Sitting   Pulse: 88   Weight: 147.7 kg (325 lb 9 oz)   Height: 1.88 m (6\' 2" )     Wt Readings from Last 3 Encounters:   07/10/21 147.7 kg (325 lb 9 oz)   07/03/21 145.2 kg (320 lb)   02/22/21 147 kg (324 lb 1.2 oz)        Constitutional -  Well appearing, and in no distress  Respiratory - Clear to auscultation bilaterally, normal respiratory effort    Cardiovascular system -    Regular rate and rhythm    Normal S1, S2    No murmurs, rubs, gallops   Jugular venous pulse is normal        Carotid upstroke normal, no carotid bruits auscultated   2+ pulses in the posterior tibial / dorsalis pedis bilaterally    Neurological - Alert, oriented, no focal neurological deficits  Extremities -  No clubbing or cyanosis. No peripheral edema    Labs - reviewed by me     Lab Results   Component Value Date/Time    WBC 10.9 04/26/2021 07:49 AM    RBC 5.02 04/26/2021 07:49 AM    HGB 14.8 04/26/2021 07:49 AM    HCT 44.4 04/26/2021 07:49 AM    PLT 269 04/26/2021 07:49 AM    TSH 0.98 03/08/2020 10:59 AM       Lab Results   Component Value Date/Time    NA 134 (L) 04/26/2021 07:52 AM    K 4.2 04/26/2021 07:52 AM    CL 104 04/26/2021 07:52 AM    CO2 23 04/26/2021 07:52 AM    GLU 177 (H) 04/26/2021 07:52 AM    BUN 19 04/26/2021 07:52 AM    CREAT 1.06 04/26/2021 07:52 AM    PROT 6.9 04/26/2021 07:52 AM  ALKPHOS 85 04/26/2021 07:52 AM    AST 15 04/26/2021 07:52 AM    ALT 22 04/26/2021 07:52 AM       Lab Results   Component Value Date/Time    CHOL 206 (H) 05/01/2020 08:17 AM    TRIG 574 (HH) 05/01/2020 08:17 AM    TRIG 228 (H) 03/26/2018 04:24 PM    HDL 36 (L) 05/01/2020 08:17 AM    LDL 79 05/01/2020 08:17 AM       Lab Results   Component Value Date/Time    HGBA1CPERCNT 8.3 05/10/2021 10:50 AM       Cardiogenics:     EKG: APVS          Electronically signed by:   Magdalen Spatz, PA  07/10/2021

## 2021-07-24 ENCOUNTER — Other Ambulatory Visit: Payer: Self-pay | Admitting: Internal Medicine

## 2021-07-24 ENCOUNTER — Ambulatory Visit: Payer: 59

## 2021-07-24 DIAGNOSIS — Z950 Presence of cardiac pacemaker: Secondary | ICD-10-CM

## 2021-07-29 ENCOUNTER — Other Ambulatory Visit (INDEPENDENT_AMBULATORY_CARE_PROVIDER_SITE_OTHER): Payer: Self-pay | Admitting: Registered Nurse

## 2021-07-29 DIAGNOSIS — E1165 Type 2 diabetes mellitus with hyperglycemia: Secondary | ICD-10-CM

## 2021-08-17 ENCOUNTER — Telehealth: Payer: Self-pay

## 2021-08-17 ENCOUNTER — Other Ambulatory Visit: Payer: Self-pay

## 2021-08-17 DIAGNOSIS — I10 Essential (primary) hypertension: Secondary | ICD-10-CM

## 2021-08-17 DIAGNOSIS — I428 Other cardiomyopathies: Secondary | ICD-10-CM

## 2021-08-17 DIAGNOSIS — I5020 Unspecified systolic (congestive) heart failure: Secondary | ICD-10-CM

## 2021-08-17 MED ORDER — BUMETANIDE 2 MG PO TABS
ORAL_TABLET | ORAL | 0 refills | Status: DC
Start: 2021-08-17 — End: 2021-10-03

## 2021-08-17 NOTE — Telephone Encounter (Signed)
Pt followed in HF clinic and they normally refill bumex, however they are asking we take over filling it. They are unable to get him into the office and we have seen him last. Please advise

## 2021-08-17 NOTE — Progress Notes (Signed)
Pt's wife called in stating pt is almost totally out of Bumex as he has been traveling. He called his pharmacy for a refill but there are none.  She also states he is generally not feeling well.  Pt was to have follow up here no later than 8 weeks from his appointment here in January of 2023 at which time his potassium was low at 3.3 and potassium dose was increased.  He was given an appointment for 04-03-21 but pt cancelled and did not reschedule.  Advised wife we do not have a provider on site today and urged her to call [facility] Cardiology as pt was seen there in June, given the fact that pt is having symptoms. Wife states she will call them now.    Darnelle Catalan, RN  [facility]  [phone]  [email or web address]

## 2021-08-17 NOTE — Telephone Encounter (Signed)
Patient wife called in regards about RX refill. Patient wife stated they called the pharmacy and pharmacy stated he had no refills and he needed to contact his doctor. Advised patient to have pharmacy to fax [place] the refill request.

## 2021-08-21 ENCOUNTER — Other Ambulatory Visit (INDEPENDENT_AMBULATORY_CARE_PROVIDER_SITE_OTHER): Payer: Self-pay | Admitting: Registered Nurse

## 2021-08-21 ENCOUNTER — Telehealth: Payer: Self-pay | Admitting: Cardiovascular Disease

## 2021-08-21 DIAGNOSIS — E1165 Type 2 diabetes mellitus with hyperglycemia: Secondary | ICD-10-CM

## 2021-08-21 MED ORDER — COLCHICINE 0.6 MG PO TABS
0.6000 mg | ORAL_TABLET | Freq: Two times a day (BID) | ORAL | 0 refills | Status: AC
Start: 2021-08-21 — End: ?

## 2021-08-21 NOTE — Telephone Encounter (Signed)
Patients wife called the cardiac office stating that the patient currently has a gout attack and asking for a refill of his colchicine. I discussed that this is not within the purview of the Cardiac office to treat though his gout medicine, though this was previously filled by this office. I offered to refill the medicine for 1 week in order to give the patient time to follow up with his PCP to undergo further evaluation and treatment. Patients and spouse expressed understanding to the above plan.    Electronically Signed by: Dr. Claybon Jabs, MD  4:46 PM 08/21/2021

## 2021-09-13 ENCOUNTER — Ambulatory Visit (INDEPENDENT_AMBULATORY_CARE_PROVIDER_SITE_OTHER): Payer: 59 | Admitting: Physician Assistant

## 2021-09-13 ENCOUNTER — Encounter (INDEPENDENT_AMBULATORY_CARE_PROVIDER_SITE_OTHER): Payer: Self-pay | Admitting: Physician Assistant

## 2021-09-13 ENCOUNTER — Encounter (INDEPENDENT_AMBULATORY_CARE_PROVIDER_SITE_OTHER): Payer: Self-pay

## 2021-09-13 VITALS — BP 110/78 | HR 78 | Temp 98.0°F | Resp 16 | Ht 74.0 in | Wt 316.4 lb

## 2021-09-13 DIAGNOSIS — E1165 Type 2 diabetes mellitus with hyperglycemia: Secondary | ICD-10-CM

## 2021-09-13 DIAGNOSIS — E782 Mixed hyperlipidemia: Secondary | ICD-10-CM

## 2021-09-13 DIAGNOSIS — Z6841 Body Mass Index (BMI) 40.0 and over, adult: Secondary | ICD-10-CM

## 2021-09-13 DIAGNOSIS — Z794 Long term (current) use of insulin: Secondary | ICD-10-CM

## 2021-09-13 DIAGNOSIS — I10 Essential (primary) hypertension: Secondary | ICD-10-CM

## 2021-09-13 MED ORDER — BAQSIMI TWO PACK 3 MG/DOSE NA POWD
1.0000 | Freq: Once | NASAL | 0 refills | Status: AC | PRN
Start: 2021-09-13 — End: 2021-09-13

## 2021-09-13 MED ORDER — GLUCOSE BLOOD VI STRP
ORAL_STRIP | 3 refills | Status: AC
Start: 2021-09-13 — End: ?

## 2021-09-13 MED ORDER — EMPAGLIFLOZIN 25 MG PO TABS
25.0000 mg | ORAL_TABLET | Freq: Every morning | ORAL | 1 refills | Status: AC
Start: 2021-09-13 — End: ?

## 2021-09-13 MED ORDER — SEMAGLUTIDE(0.25 OR 0.5MG/DOS) 2 MG/3ML SC SOPN
0.5000 mg | PEN_INJECTOR | SUBCUTANEOUS | 0 refills | Status: AC
Start: 2021-09-13 — End: ?

## 2021-09-13 MED ORDER — FREESTYLE LIBRE 2 SENSOR MISC
1.0000 | 3 refills | Status: AC
Start: 2021-09-13 — End: ?

## 2021-09-13 NOTE — Progress Notes (Signed)
Samuel Koch (Key: [id number])  Rx #: [id number]  Ozempic (0.25 or 0.5 MG/DOSE) 2MG Samuel Koch pen-injectors     Form  IT sales professional PA Form [phone] NCPDP)  Created  37 minutes ago  Sent to Plan  33 minutes ago  Plan Response  33 minutes ago  Submit Clinical Questions  24 minutes ago  Determination  Favorable  6 minutes ago  Message from Plan  PA Case: [id number], Status: Approved, Coverage Starts on: 09/13/2021 12:00:00 AM, Coverage Ends on: 09/13/2022 12:00:00 AM.

## 2021-09-13 NOTE — Patient Instructions (Signed)
Below is a summary of information and instructions we reviewed at today's appointment. Please review this information carefully and call if you have any questions regarding it.    Directions for your next appointment:   Diabetic lab work is generally needed every 3 months.  Please see specific instructions below about when your lab work is due.  Please make every effort to have your lab work done prior to your appointment so we can review it at the appointment.  If you do not have the labs done before the visit, we may need to reschedule your appointment.  If you are using a glucose meter, please bring your meter to the appointment.  If you are using a continuous glucose monitor (Dexcom or libre) device, please bring this to your appointment so that your data can be reviewed at the appointment.      Diabetes information/instructions:   Labwork: You are due for lab work  now. And again in 3 months just before your next appt.     Libre CGM: Scan sensor at least once every 8 hours for most accurate performance   Store sensors between 39-77 degrees.  Do not use expired sensors.   Do not inject insulin/wear insulin pump cannula within 1 inch of sensor.   Rotate sensor sites.   Remove sensor if having MRI, CT, Xray.  Please share your data on LibreView.  If you are not currently sharing data, please contact the office and we will send you instructions on how to share data.   If you stop wearing CGM for a period of time, please resume fingerstick blood sugar checks at least four times a day.   If using both reader (scanner) and phone, you must scan the first time after placing a new sensor with the reader.   Please contact FreeStyle or refer to Reference Guide for more information/troubleshooting.      Continue your current diabetic medication regimen unchanged except increase Jardiance to 25 mg once daily. Decrease the Lantus to 60 units once daily. If you are tolerating the Jardiance well after 1-2 weeks and blood sugars  are stable without lows, add the Ozempic back in. Start at 0.25 mg once weekly for 4 weeks, then you can increase to 0.5 mg once weekly providing you are tolerating this well.          Low Blood Sugar Prevention and Treatment:   Please carry fast-acting carbohydrate source (preferably glucose tablets) with you at all times. Please treat low blood sugar with 15 grams carbohydrate (4 glucose tablets), wait 15 minutes, and recheck sugar.  Re-treat if necessary.   Please notify office for any episode of severe hypoglycemia, such as those requiring assistance from someone else.          Diet:   Please decrease your intake of simple sugars and starches. Please watch your portions.     Activity:   We have recommended that you continue your current exercise regimen.    Foot Care:   Please examine feet daily and appropriately treat callouses or lesions. Please avoid being barefoot or wearing of ill fitting shoes. Please use lotion as needed to treat dry skin on feet.     Diabetic Education:   We did not discuss diabetic education today. If you would like to follow up with a diabetic educator, please call the office and we will arrange for this    Here is a summary of the information we have about your health screening  for diabetic complications:   Eye exam:  Our records indicate that your last diabetic eye exam was over a year ago and you plan to schedule an exam soon,   Urine test for early diabetic kidney disease (microalbumin/creatinine ratio): Our records indicate that your last microalbumin creatinine ratio was done on 01/08/20 and was normal.  Monofilament test for neuropathy in feet:  We did a monofilament test on your feet 09/13/21, and the results were abnormal.  Dental care: You have told us that you have not seen a dentist in many years.  Diabetic screening is up-to-date except for an eye exam and a dental exam.  It is important to have your eyes examined every year and to have regular dental care.       The  following additional instructions relate to health conditions other than your diabetes:   Cholesterol/lipids: We will recheck this now.   Blood Pressure: Your blood pressure is well controlled on the current medication regimen and we will continue this.

## 2021-09-13 NOTE — Progress Notes (Signed)
New Patient Diabetes Consult Note      Patient Name:  Samuel Koch [[id number]] DOB: 1970-10-10  Date: 09/13/2021    Subjective:          Samuel Koch is a 51 y.o. male who presents for evaluation  of type 2 diabetes mellitus. The patient was previously followed by Cira Servant, NP, and this is our first occasion to meet. Since the last visit, the patient stopped taking metformin and Ozempic d/t GI side effects. He did ok with lower doses of Ozempic, but had side effects at the 1 mg dose. He has been working on his diet and exercise habits for weight loss. There have been no symptoms of elevated sugar.    The following changes to the diabetic regimen were made since the last visit:   Ozempic dose increased to 1 mg.       Current diabetic regimen:   Lantus 36 units bid. Tolerating without difficulty.  Humalog 12 units with each meal plus 2:50>150. Tolerating without difficulty.  Jardiance 10 mg daily. Tolerating without difficulty.      Follows with cardiology for CHF. States his last echo showed normal EF.       Diabetes and Diabetic Complication History:  Diagnosis Date: 2017  Prior Diabetic Treatment:  Higher dose Ozempic and metformin stopped d/t GI side effects  Hgb A1c at time of referral: 8.3%  HGB A1C goal based on age and comorbidities is 7%.  The patient has the following diabetic complications:  congestive heart failure and peripheral neuropathy  Since the last visit, the patient notes a new problem with blurry vision in L eye - plans to schedule eye appt .    Hypoglycemia:   The patient has experienced mild occasional hypoglycemia since the last visit.  The patient treats hypoglycemia with juice, snack. Symptoms of hypoglycemia include: jitteriness   Baqsimi ordered today.   The patient does not have a medical alert bracelet or necklace.  Pt reports he has been advised by cardiology to avoid BG under 120 to prevent cardiac complications from hypoglycemia.    Diet, Exercise, and Weight:     The  patient tries to watch portions, starches and sweets. Just started Weight Watchers.   Exercise:  Just restarted regular exercise - walking 2 miles daily at gym  Weight:   Wt Readings from Last 6 Encounters:   07/10/21 147.7 kg (325 lb 9 oz)   07/03/21 145.2 kg (320 lb)   02/22/21 147 kg (324 lb 1.2 oz)   02/13/21 145 kg (319 lb 9.6 oz)   01/16/21 148.7 kg (327 lb 13.2 oz)   01/04/21 156.8 kg (345 lb 9.6 oz)        Diabetes Monitoring and Insulin/Injectables     Glucose Meter   One Touch Ultra 2   The patient is currently testing home blood glucose  once daily fasting   130s-170s  One number in the 200s - had mild hypoglycemia at 87 in the middle of the night, then rebound high causing this high reading.     Continuous Glucose Sensor (CGM)  The patient is not currently using CGM  due to cost.     Injections:   Injections are given by patient.    Injections sites include: abdominal wall   Injection compliance: The patient never misses injections.   No problems noted with injection sites.      Insulin Pump  The patient is not currently using an insulin pump.  Current Outpatient Medications   Medication Sig Dispense Refill    allopurinol (ZYLOPRIM) 100 MG tablet Take 1 tablet (100 mg) by mouth daily      Blood Glucose Monitor, generic, Kit Use as directed to monitor blood sugar 1 kit 1    Blood Glucose Monitoring Suppl (OneTouch Ultra 2) w/Device Kit USE AS DIRECTED TO MONITOR BLOOD SUGAR      bumetanide (BUMEX) 2 MG tablet Take 2 tablets (4 mg) by mouth every morning AND 1 tablet (2 mg) Daily after lunch. May also take 1 tablet (2 mg) as needed (weight gain of 2 lbs overnight or 5 lbs in 1 week). 360 tablet 0    Cholecalciferol (vitamin D3) 25 MCG (1000 UT) Cap Take 1 capsule (1,000 Units) by mouth daily      colchicine 0.6 MG tablet Take 1 tablet (0.6 mg) by mouth 2 (two) times daily 14 tablet 0    Continuous Blood Gluc Sensor (FreeStyle Libre 2 Sensor Systm) Misc 1 Device by Does not apply route every 14 (fourteen)  days 2 each 12    empagliflozin (Jardiance) 10 MG tablet TAKE 1 TABLET BY MOUTH ONCE DAILY IN THE MORNING 90 tablet 3    glucose blood test strip Check BS 3 times daily 270 each 3    insulin glargine (LANTUS SOLOSTAR) 100 UNIT/ML injection pen INJECT 36 UNITS SUBCUTANEOUSLY TWICE DAILY 75 mL 1    insulin lispro, 1 Unit Dial, 100 UNIT/ML (AdmeLOG SoloStar Pen/HumaLOG Kwikpen) pen-injector INJECT 14 UNITS SUBCUTANEOUSLY PLUS SLIDING SCALE 2:50>150 THREE TIMES DAILY BEFORE MEALS WITH MAX OF 75 UNITS DAILY 15 mL 0    Insulin Pen Needle (Pen Needles 3/16") 31G X 5 MM Misc 1 each 5 times per day 450 each 3    Insulin Syringes, Disposable, U-100 1 ML Misc 1 each by Does not apply route 2 (two) times daily 180 each 0    Lancets Ultra Fine Misc Check BS 5 times daily 150 each 11    lisinopril (ZESTRIL) 5 MG tablet Take 1 tablet (5 mg) by mouth daily 90 tablet 3    metOLazone (ZAROXOLYN) 5 MG tablet Take 0.5 tablets (2.5 mg) by mouth as needed (weight gain of greater than 5 lbs in 1 week)      metoprolol tartrate (LOPRESSOR) 25 MG tablet Take 0.5 tablets (12.5 mg) by mouth as needed (palpitations) 45 tablet 0    potassium chloride (K-TAB) 10 MEQ CR tablet Take 1 tablet (10 mEq) by mouth 2 (two) times daily 180 tablet 3    Semaglutide, 1 MG/DOSE, (Ozempic, 1 MG/DOSE,) 2 MG/1.5ML Solution Pen-injector Inject 1 mg into the skin once a week 9 mL 3    spironolactone (ALDACTONE) 25 MG tablet Take 1 tablet (25 mg total) by mouth daily 90 tablet 3     No current facility-administered medications for this visit.       Immunization History   Administered Date(s) Administered    COVID-19 Ad26 vaccine 18 years and above ([employer]) 0.5 mL 06/23/2019     The following portions of the patient's history were reviewed and updated as appropriate: allergies, current medications, past family history, past medical history, past social history, past surgical history, and problem list.    Review of Systems  As above.      Objective:      Vital  Signs: BP 110/78 (BP Site: Left arm, Patient Position: Sitting, Cuff Size: Large)   Pulse 78   Temp 98 F (36.7 C) (Temporal)  Resp 16   Ht 1.88 m (6\' 2" )   Wt 143.5 kg (316 lb 6.4 oz)   SpO2 96%   BMI 40.62 kg/m     PE  Well nourished, well appearing, in no acute distress.  HEENT:  There is no stare or lid lag. There is no periorbital edema. Extraoccular movements are intact. No conjunctival injection.   Neck is supple without adenopathy.  The thyroid is normal in size and consistency and no nodules are appreciated. Exam limited due to body habitus.   Lungs are clear to auscultation.  Cardiac exam reveals a regular rate and rhythm. No murmur or gallop is appreciated.   Exam of the extremities reveals no peripheral edema.   Posterior tibial and doralis pedis pulses are normal.  There is no significant deformity of the feet.  Callouses are noted on heels. Toenail hygeine is  fair  Onychomycosis is noted in multiple nails on both feet.  On neurologic exam, the patient is alert and appropriate.  Speech is normal.  There is There is a mild tremor of the outstretched hands.   tremor of the outstretched hands.  Skin is warm and dry.    Lab Review    Lab Results   Component Value Date    HGBA1CPERCNT 8.3 05/10/2021    HGBA1CPERCNT 8.2 03/26/2018    HGBA1CPERCNT 10.7 10/06/2017    HGBA1C 7.5 (H) 09/14/2020    HGBA1C 7.5 (H) 05/01/2020    HGBA1C 7.0 (H) 01/08/2020    TSH 0.98 03/08/2020    CHOL 206 (H) 05/01/2020    HDL 36 (L) 05/01/2020    LDL 79 05/01/2020    TRIG 161 (HH) 05/01/2020    ALT 22 04/26/2021    GLU 177 (H) 04/26/2021    K 4.2 04/26/2021    CA 9.1 04/26/2021    CREAT 1.06 04/26/2021    CO2 23 04/26/2021    EGFR 84 04/26/2021    NA 134 (L) 04/26/2021    ALKPHOS 85 04/26/2021       Assessment:      1. Type 2 diabetes mellitus with hyperglycemia, with long-term current use of insulin  empagliflozin (JARDIANCE) 25 MG tablet    Semaglutide,0.25 or 0.5MG /DOS, 2 MG/3ML Solution Pen-injector    Continuous  Blood Gluc Sensor (FreeStyle Libre 2 Sensor) Misc    Glucagon (Baqsimi Two Pack) 3 MG/DOSE Powder    glucose blood test strip    Lipid panel    Hemoglobin A1C    Basic Metabolic Panel    ALT    TSH, Abn Reflex to Free T4, Serum    Microalbumin / Creatinine Ratio    Hemoglobin A1C    Basic Metabolic Panel      2. Essential hypertension        3. Mixed hyperlipidemia        4. BMI 40.0-44.9, adult          Diabetes: Adequacy of diabetic control cannot be assessed due to lack of data. Discussed specific areas of diabetes management as detailed under patient instructions below.  Few home BG readings to review, pt also due for A1c. We discussed restarting CGM which he will do. We also discussed alternative treatments to insulin, so that hopefully he is able to cut back on insulin doses or possibly eliminate some insulin. He has been having more overnight hypoglycemia since he started exercising and being more mindful of his diet - insulin adjusted as noted below. He will also switch to once  daily Lantus dosing to simplify his regimen. He has tolerated the Jardiance well, so we will plan to increase this dose. If he tolerates the higher dose well, he will then consider adding back in lower dose Ozempic - he did not tolerate 1 mg dose, so we discussed titrating up to 0.5 mg only (or possibly considering alternative GLP-1). Will work on decreasing insulin doses pending BG trends.     Lipids: Due for recheck. He does not tolerate statins.     BP: Controlled on current treatment.     BMI 40: Restart GLP-1. Continue diet and exercise improvements.        Plan:   Below is a summary of information and instructions we reviewed at today's appointment. Please review this information carefully and call if you have any questions regarding it.    Directions for your next appointment:   Diabetic lab work is generally needed every 3 months.  Please see specific instructions below about when your lab work is due.  Please make every  effort to have your lab work done prior to your appointment so we can review it at the appointment.  If you do not have the labs done before the visit, we may need to reschedule your appointment.  If you are using a glucose meter, please bring your meter to the appointment.  If you are using a continuous glucose monitor (Dexcom or libre) device, please bring this to your appointment so that your data can be reviewed at the appointment.      Diabetes information/instructions:   Labwork: You are due for lab work  now. And again in 3 months just before your next appt.     Libre CGM: Scan sensor at least once every 8 hours for most accurate performance   Store sensors between 39-77 degrees.  Do not use expired sensors.   Do not inject insulin/wear insulin pump cannula within 1 inch of sensor.   Rotate sensor sites.   Remove sensor if having MRI, CT, Xray.  Please share your data on LibreView.  If you are not currently sharing data, please contact the office and we will send you instructions on how to share data.   If you stop wearing CGM for a period of time, please resume fingerstick blood sugar checks at least four times a day.   If using both reader (scanner) and phone, you must scan the first time after placing a new sensor with the reader.   Please contact FreeStyle or refer to Reference Guide for more information/troubleshooting.      Continue your current diabetic medication regimen unchanged except increase Jardiance to 25 mg once daily. Decrease the Lantus to 60 units once daily. If you are tolerating the Jardiance well after 1-2 weeks and blood sugars are stable without lows, add the Ozempic back in. Start at 0.25 mg once weekly for 4 weeks, then you can increase to 0.5 mg once weekly providing you are tolerating this well.          Low Blood Sugar Prevention and Treatment:   Please carry fast-acting carbohydrate source (preferably glucose tablets) with you at all times. Please treat low blood sugar with 15  grams carbohydrate (4 glucose tablets), wait 15 minutes, and recheck sugar.  Re-treat if necessary.   Please notify office for any episode of severe hypoglycemia, such as those requiring assistance from someone else.          Diet:   Please decrease your intake of  simple sugars and starches. Please watch your portions.     Activity:   We have recommended that you continue your current exercise regimen.    Foot Care:   Please examine feet daily and appropriately treat callouses or lesions. Please avoid being barefoot or wearing of ill fitting shoes. Please use lotion as needed to treat dry skin on feet.     Diabetic Education:   We did not discuss diabetic education today. If you would like to follow up with a diabetic educator, please call the office and we will arrange for this    Here is a summary of the information we have about your health screening for diabetic complications:   Eye exam:  Our records indicate that your last diabetic eye exam was over a year ago and you plan to schedule an exam soon,   Urine test for early diabetic kidney disease (microalbumin/creatinine ratio): Our records indicate that your last microalbumin creatinine ratio was done on 01/08/20 and was normal.  Monofilament test for neuropathy in feet:  We did a monofilament test on your feet 09/13/21, and the results were abnormal.  Dental care: You have told us that you have not seen a dentist in many years.  Diabetic screening is up-to-date except for an eye exam and a dental exam.  It is important to have your eyes examined every year and to have regular dental care.       The following additional instructions relate to health conditions other than your diabetes:   Cholesterol/lipids: We will recheck this now.   Blood Pressure: Your blood pressure is well controlled on the current medication regimen and we will continue this.        Return in about 3 months (around 12/14/2021).  Samuel Lennartz L Kae Lauman, PA        56  minutes was the total time  spent on behalf of this patient on this date, including time spent preparing to see the patient, obtaining and/or reviewing separately obtained history, performing a medically appropriate physical examination or evaluation, counseling and educating patient/family/caregiver, ordering medications, tests or procedures, documenting clinical information in the electronic medical record, and Independently interpreting results and communicating results to patient/family/caregiver.

## 2021-09-30 NOTE — Progress Notes (Signed)
Note reviewed. Agree with assessment and plan as outlined by Ms. Cottino, PA-C  Owen Pratte A. Mischelle Reeg, MD, FACP, FACE

## 2021-10-03 ENCOUNTER — Ambulatory Visit
Admission: RE | Admit: 2021-10-03 | Discharge: 2021-10-03 | Disposition: A | Discharge status: Home / Self Care | Payer: 59 | Source: Ambulatory Visit | Attending: Family | Admitting: Family

## 2021-10-03 ENCOUNTER — Encounter: Payer: Self-pay | Admitting: Family

## 2021-10-03 VITALS — BP 114/75 | HR 84 | Wt 312.0 lb

## 2021-10-03 DIAGNOSIS — Z794 Long term (current) use of insulin: Secondary | ICD-10-CM | POA: Insufficient documentation

## 2021-10-03 DIAGNOSIS — Z6841 Body Mass Index (BMI) 40.0 and over, adult: Secondary | ICD-10-CM

## 2021-10-03 DIAGNOSIS — E119 Type 2 diabetes mellitus without complications: Secondary | ICD-10-CM | POA: Insufficient documentation

## 2021-10-03 DIAGNOSIS — Z79899 Other long term (current) drug therapy: Secondary | ICD-10-CM | POA: Insufficient documentation

## 2021-10-03 DIAGNOSIS — I5042 Chronic combined systolic (congestive) and diastolic (congestive) heart failure: Secondary | ICD-10-CM | POA: Insufficient documentation

## 2021-10-03 DIAGNOSIS — I428 Other cardiomyopathies: Secondary | ICD-10-CM | POA: Insufficient documentation

## 2021-10-03 DIAGNOSIS — I110 Hypertensive heart disease with heart failure: Secondary | ICD-10-CM | POA: Insufficient documentation

## 2021-10-03 DIAGNOSIS — I10 Essential (primary) hypertension: Secondary | ICD-10-CM

## 2021-10-03 DIAGNOSIS — I5030 Unspecified diastolic (congestive) heart failure: Secondary | ICD-10-CM

## 2021-10-03 DIAGNOSIS — Z8249 Family history of ischemic heart disease and other diseases of the circulatory system: Secondary | ICD-10-CM | POA: Insufficient documentation

## 2021-10-03 LAB — I-STAT CHEM 8 CARTRIDGE
Anion Gap I-Stat: 19 — ABNORMAL HIGH (ref 7.0–16.0)
BUN I-Stat: 25 mg/dL — ABNORMAL HIGH (ref 7–22)
Calcium Ionized I-Stat: 4.9 mg/dL (ref 4.35–5.10)
Chloride I-Stat: 98 mMol/L (ref 98–110)
Creatinine I-Stat: 1.3 mg/dL (ref 0.80–1.30)
EGFR: 66 mL/min/{1.73_m2} (ref 60–150)
Glucose I-Stat: 141 mg/dL — ABNORMAL HIGH (ref 71–99)
Hematocrit I-Stat: 45 % (ref 39.0–52.5)
Hemoglobin I-Stat: 15.3 gm/dL (ref 13.0–17.5)
Potassium I-Stat: 3.6 mMol/L (ref 3.5–5.3)
Sodium I-Stat: 137 mMol/L (ref 136–147)
TCO2 I-Stat: 25 mMol/L (ref 24–29)

## 2021-10-03 MED ORDER — POTASSIUM CHLORIDE ER 10 MEQ PO TBCR
10.0000 meq | EXTENDED_RELEASE_TABLET | Freq: Two times a day (BID) | ORAL | Status: AC
Start: 2021-10-03 — End: ?

## 2021-10-03 MED ORDER — SPIRONOLACTONE 25 MG PO TABS
25.0000 mg | ORAL_TABLET | Freq: Every day | ORAL | 3 refills | Status: AC
Start: 2021-10-03 — End: ?

## 2021-10-03 MED ORDER — BUMETANIDE 2 MG PO TABS
ORAL_TABLET | ORAL | 0 refills | Status: AC
Start: 2021-10-03 — End: ?

## 2021-10-03 NOTE — Progress Notes (Signed)
[facility]      Established Visit    Patient Name: Samuel Koch     Date of Birth: [birth year]    Provider: Merita Norton, NP -C      Primary Cardiologist: Lovey Newcomer. [name], DO  Primary care provider:  Dr. [name]    Chief Complaint:    Follow-up   Subjective     HPI:  I had the pleasure of seeing [name] today at [facility] for an advanced heart failure visit.  [name] is a 51 y.o. male with nonischemic cardiomyopathy,  recovered EF 55-60% (01/04/21); previous 50-55% (08/13/2019); NYHA Class 3, ACC/AHA Stage C.  Samuel Nettle Edell also  has a past medical history of Congestive heart failure, Type 2 Diabetes mellitus, Hypertension, and PVC's (premature ventricular contractions).    From a cardiovascular standpoint, Mr. [name] reports he is doing well.  He states, "I feel better than I have in years".  His most recent office visit here was 02/13/21 with Inez Catalina, NP.  He has not had recent hospital admissions.   Over the past six months, weight has trended down about 7 lbs, with intentional improvements in dietary intake. Has started Weight Watcher's.   Endocrinology increased Jardiance to 25 mg daily.   He is walking 2.5 miles a day; denies chest pain.  He only takes Lopressor 12.5 mg PRN for palpitations; he does not want to take it consistently each day as it causes extreme leg fatigue.  Denies SOB, unless walking up stairs; not new.   Denies orthopnea or PND.  He is taking Bumex 4 mg twice a day, and potassium chloride 10 meq once a day, and takes additional potassium chloride 10 meq three evenings a week.   He has not needed to take metolazone.     Mr. [name] reports that he has been exercising daily  [name] has been following a fluid restriction of 2000 mL per day better and has restricting dietary sodium content to no more than 2 gm/day.    He reports that he has been compliant with his medications.      Review of Systems:  Review  of Systems   Constitutional:  Positive for weight loss. Negative for chills and fever.   Respiratory:  Positive for shortness of breath. Negative for cough.    Cardiovascular:  Negative for chest pain, palpitations and leg swelling.   Gastrointestinal:  Negative for abdominal pain, blood in stool, constipation, diarrhea, nausea and vomiting.   Genitourinary:  Negative for dysuria and hematuria.      Comprehensive 12 point review of system is negative unless described above in HPI    Review of Cardiovascular Testing:  EKG (07/19/2020):  sinus rhythm. Ventricular rate 87.     PMH:  1. Chronic combined systolic and diastolic CHF   A. RHC 06/08/19- wedge pressure 16, RB pressure 33/7. Pulmonary artery pressure 30/12    2. Nonischemic cardiomyopathy   A. LHC (6/17): 30% mid RCA, no other significant disease.   B. Echo (6/17): EF 30-35%.   C. Holter (7/17): 19% PVCs.   D. Echo (3/18): EF 50-55%, moderate LVH, grade II diastolic dysfunction.   E. Echo (9/18): EF 50-55%.   F. Echo 10/07/17- LV cavity size is increased. The LVEF is est. at 50%. Mild hypokinesis of the distal septum and apex which may be due to paced rhythm. Pacer wire present in RV.   G. Echo 05/22/18- EF  50-55%. Grade II DD. Mitral valve leaflets mildly thickened without stenosis. Trace MR. Trace TR. Estimated pulmonary arterial pressure is 25-30 mmHg.   H. Echo 06/12/2018- Mild concentric LVH, EF 50-55%, aortic root is mildy dilated, aortic root is 3.9cm, tricuspid regurgitation is mild, RSVP .   I. Echo 08/13/19- EF 50-55%. Grade I diastolic dysfunction of the left ventricle (impaired relaxation pattern). The left ventricular ejection fraction is low normal, estimated at 50- 55%. RV mildly dilated in size with normal systolic function. A pacer wire is present in RV. No significant valvular heart disease or evidence for pulmonary hypertension.     3. Acute Coronary Syndrome   A. Echo 03/27/2018- Technically difficult study with limited endocardial definition  and structural resolution in the apical views, Intravenous contrast was administered to enhance endocardial definition, LV cavity size is norma, mild concentric left ventricular hypertrophy, LV systolic function is at the lower limits of normal, EF 50%, no regional wall motion abnormalities present, LV diastolic filling pattern is probably pseudonormalized, consistent with grade 2 left ventricular diastolic dysfunction and elevated left atrial pressure, Right-sided chambers are not optimally visualized but appear grossly normal in size, Pacemaker lead is visualized within the right heart chamber, no significant valvular heart disease.   B. Stress 03/28/2018- Completed vasodilator protocol., No ischemic changes with infusion, No arrhythmias, Overall, low risk vasodilator stress rest SPECT perfusion study, a large moderate to severe perfusion defect involving the apex, inferior stripe, mid and basal inferoseptal and inferolateral segments with mild reversibility suggestive of possible ischemia but with likely strong component of diaphragmatic attenuation artifact, Also anteroseptal perfusion defect from attenuation artifact, No significant findings of ischemia Noted, Dilated LV with mildly decreased left ventricular function, stress EF 44%.   C. THC 03/30/2018- Negative heart cath. False positive stress test.     4. Bradycardia/ SSS   A. 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 [facility])     5. PVCs   A. 48 hour holter (7/17) with 19% PVCs.   B. PVC ablation in 8/17 but PVCs recurred.   C. Holter (11/17) with rare PVCs, < 1%.   D. Holter (9/18) with rare PVCs only. 6. NSVT   7. Type II diabetes   8. Hypertriglyceridemia   9.  Gout      Past Surgical History:   has a past surgical history that includes APPENDECTOMY (OPEN); Cardiac pacemaker placement; Right & Left Heart Cath  w/ Coronary Angios, LV/LA (Right, 03/30/2018); and Right Heart Cath (Right, 06/08/2019).    Family History:  family  history includes Diabetes in his brother and paternal grandmother; Heart attack in his sister; Heart disease in his brother, father, and sister; Hypertension in his father; No known problems in his mother.  Married    Social History:   reports that he has quit smoking. He has quit using smokeless tobacco. He reports that he does not drink alcohol and does not use drugs.    Allergies:  has No Known Allergies.    Medications:    Current Outpatient Medications:     Blood Glucose Monitor, generic, Kit, Use as directed to monitor blood sugar, Disp: 1 kit, Rfl: 1    Blood Glucose Monitoring Suppl (OneTouch Ultra 2) w/Device Kit, USE AS DIRECTED TO MONITOR BLOOD SUGAR, Disp: , Rfl:     bumetanide (BUMEX) 2 MG tablet, Take 2 tablets (4 mg) by mouth every morning AND 1 tablet (2 mg) Daily after lunch. May also take 1 tablet (2 mg) as  needed (weight gain of 2 lbs overnight or 5 lbs in 1 week). (Patient taking differently: Take 2 tablets (4 mg) by mouth every morning AND 1 tablet (2 mg) Daily after lunch. May also take 1 tablet (2 mg) as needed (weight gain of 2 lbs overnight or 5 lbs in 1 week). Pt taking 4mg  in th a.m and 4 mg in the afternoon), Disp: 360 tablet, Rfl: 0    Cholecalciferol (vitamin D3) 25 MCG (1000 UT) Cap, Take 1 capsule (1,000 Units) by mouth daily, Disp: , Rfl:     Continuous Blood Gluc Sensor (FreeStyle Libre 2 Sensor) Misc, Use 1 Device every 14 (fourteen) days, Disp: 6 each, Rfl: 3    empagliflozin (JARDIANCE) 25 MG tablet, Take 1 tablet (25 mg) by mouth every morning, Disp: 90 tablet, Rfl: 1    Febuxostat (ULORIC) 40 MG tablet, Take 1 tablet (40 mg) by mouth daily, Disp: , Rfl:     glucose blood test strip, Check blood sugar 4 times daily, Disp: 360 each, Rfl: 3    insulin glargine (LANTUS SOLOSTAR) 100 UNIT/ML injection pen, INJECT 36 UNITS SUBCUTANEOUSLY TWICE DAILY, Disp: 75 mL, Rfl: 1    insulin lispro, 1 Unit Dial, 100 UNIT/ML (AdmeLOG SoloStar Pen/HumaLOG Kwikpen) pen-injector, INJECT 14 UNITS  SUBCUTANEOUSLY PLUS SLIDING SCALE 2:50>150 THREE TIMES DAILY BEFORE MEALS WITH MAX OF 75 UNITS DAILY, Disp: 15 mL, Rfl: 0    Insulin Pen Needle (Pen Needles 3/16") 31G X 5 MM Misc, 1 each 5 times per day, Disp: 450 each, Rfl: 3    Insulin Syringes, Disposable, U-100 1 ML Misc, 1 each by Does not apply route 2 (two) times daily, Disp: 180 each, Rfl: 0    Lancets Ultra Fine Misc, Check BS 5 times daily, Disp: 150 each, Rfl: 11    lisinopril (ZESTRIL) 5 MG tablet, Take 1 tablet (5 mg) by mouth daily, Disp: 90 tablet, Rfl: 3    metOLazone (ZAROXOLYN) 5 MG tablet, Take 0.5 tablets (2.5 mg) by mouth as needed (weight gain of greater than 5 lbs in 1 week), Disp: , Rfl:     metoprolol tartrate (LOPRESSOR) 25 MG tablet, Take 0.5 tablets (12.5 mg) by mouth as needed (palpitations), Disp: 45 tablet, Rfl: 0    potassium chloride (K-TAB) 10 MEQ CR tablet, Take 1 tablet (10 mEq) by mouth 2 (two) times daily (Patient taking differently: Take 1 tablet (10 mEq) by mouth 2 (two) times daily Taking 1 tablet in the am and 1 tablet every other evening), Disp: 180 tablet, Rfl: 3    predniSONE (DELTASONE) 20 MG tablet, As needed for flair ups, Disp: , Rfl:     Semaglutide,0.25 or 0.5MG /DOS, 2 MG/3ML Solution Pen-injector, Inject 0.5 mg into the skin once a week, Disp: 3 mL, Rfl: 0    spironolactone (ALDACTONE) 25 MG tablet, Take 1 tablet (25 mg total) by mouth daily, Disp: 90 tablet, Rfl: 3    tadalafil (CIALIS) 20 MG tablet, Take 1 tablet (20 mg) by mouth daily, Disp: , Rfl:     colchicine 0.6 MG tablet, Take 1 tablet (0.6 mg) by mouth 2 (two) times daily (Patient not taking: Reported on 10/03/2021), Disp: 14 tablet, Rfl: 0       Objective     Visit Vitals  BP 114/75   Pulse 84   Wt 141.5 kg (312 lb)   SpO2 98%   BMI 40.06 kg/m       Wt Readings from Last 7 Encounters:   10/03/21 141.5  kg (312 lb)   09/13/21 143.5 kg (316 lb 6.4 oz)   07/10/21 147.7 kg (325 lb 9 oz)   07/03/21 145.2 kg (320 lb)   02/22/21 147 kg (324 lb 1.2 oz)    02/13/21 145 kg (319 lb 9.6 oz)   01/16/21 148.7 kg (327 lb 13.2 oz)       Physical Exam:   General: well-developed male, alert, pleasant, NAD  HEENT:  Anicteric sclera  Neck:  no JVD noted.   Lungs: bilateral breath sounds clear; no rhonchi, wheezes or crackles with normal respiratory effort  Cardiovascular: s1s2, normal rate, no m/r/g  Abdominal: protuberant; soft; non-tender  Extremities: skin warm and dry; no edema noted   Neuromuscular exam:  MAEW. Rises from chair independently; ambulates without difficulty.   Skin: no rash or ulceration.      Laboratory Data:  Lab Results   Component Value Date/Time    NA 134 (L) 04/26/2021 07:52 AM    K 4.2 04/26/2021 07:52 AM    CL 104 04/26/2021 07:52 AM    CO2 23 04/26/2021 07:52 AM    CA 9.1 04/26/2021 07:52 AM    GLU 177 (H) 04/26/2021 07:52 AM    CREAT 1.30 10/03/2021 01:05 PM    CREAT 1.06 04/26/2021 07:52 AM    BUN 19 04/26/2021 07:52 AM    PROT 6.9 04/26/2021 07:52 AM    ALB 3.3 (L) 04/26/2021 07:52 AM    ALKPHOS 85 04/26/2021 07:52 AM    ALT 22 04/26/2021 07:52 AM    AST 15 04/26/2021 07:52 AM    BILITOTAL 0.5 04/26/2021 07:52 AM    AGRATIO 0.92 04/26/2021 07:52 AM    ANIONGAP 11.2 04/26/2021 07:52 AM    BUNCRRATIO 17.9 04/26/2021 07:52 AM    EGFR 66 10/03/2021 01:05 PM    OSMO 275 04/26/2021 07:52 AM    GLOB 3.6 04/26/2021 07:52 AM         Additional Non-ischemic w/u panel:  Lab Results   Component Value Date/Time    TSH 0.98 03/08/2020 10:59 AM    T4FREE Not Indicated 03/08/2020 10:59 AM       No results found for: "HIVAGAB"    No results found for: "HCVAB", "HBV", "HEPBCOREAB"     Lab Results   Component Value Date/Time    WBC 10.9 04/26/2021 07:49 AM    RBC 5.02 04/26/2021 07:49 AM    HGB 14.8 04/26/2021 07:49 AM    HCT 44.4 04/26/2021 07:49 AM    PLT 269 04/26/2021 07:49 AM     Lab Results   Component Value Date/Time    CHOL 206 (H) 05/01/2020 08:17 AM    TRIG 574 (HH) 05/01/2020 08:17 AM    TRIG 228 (H) 03/26/2018 04:24 PM    HDL 36 (L) 05/01/2020 08:17 AM     LDL 79 05/01/2020 08:17 AM     Lab Results   Component Value Date/Time    HGBA1CPERCNT 8.3 05/10/2021 10:50 AM     Lab Results   Component Value Date/Time    BNP 10.2 05/21/2020 08:27 PM              Today's I-stat Labs:  Results       Procedure Component Value Units Date/Time    i-Stat Chem 8 CartrIDge [[id number]]  (Abnormal) Collected: 10/03/21 1305    Specimen: Blood Updated: 10/03/21 1309     Sodium I-Stat 137 mMol/L      Potassium I-Stat 3.6 mMol/L  Chloride I-Stat 98 mMol/L      TCO2 I-Stat 25 mMol/L      Calcium Ionized I-Stat 4.90 mg/dL      Glucose I-Stat 829 mg/dL      Creatinine I-Stat 1.30 mg/dL      BUN I-Stat 25 mg/dL      Anion Gap I-Stat 56.2     EGFR 66 mL/min/1.79m2      Hematocrit I-Stat 45.0 %      Hemoglobin I-Stat 15.3 gm/dL            Assessment and Plan:  1. NYHA class 2 heart failure with preserved ejection fraction, with improvement of ejection fraction from prior measurement    2. NICM (nonischemic cardiomyopathy)    3. BMI 40.0-44.9, adult    4. On potassium wasting diuretic therapy    5. Essential hypertension    6. Type 2 diabetes mellitus treated with insulin         My impression is that, Mr. Bruemmer has a stage C, nonischemic cardiomyopathy and currently reports NYHA Class II level of symptoms.      he  is well compensated and well perfused on exam. he does not have evidence of end-organ hypoperfusion based on labs and clinical exam.     We will be monitoring closely for changes in heart failure symptoms, electrolyte abnormalities and arrhythmias.    In regards to GDMT:  Beta-blocker: no -does not tolerate on daily basis; takes Lopressor 12.5 mg PRN palpitations  ACE-I/ARB/ARNI:yes; Lisinopril 5 mg daily  Aldosterone antagonist:yes; Aldactone 25 mg once a day  Hydralazine/nitrate: no   Digoxin: no    SGLT2i:  yes; Jardiance 25 mg daily    Plan:  Serum potassium 3.6; creatinine 1.3;  take potassium chloride 10 meq twice a day consistently  Continue Bumex 4 mg twice a day; if  weight loss, then decrease to Bumex 4 mg n AM and 2 mg at lunch, and decrease potassium to 10 meq once a day  Follow up lab, BMP, in three months to monitor diuretic therapy  Follow up in clinic in 6 months; sooner if needed  Device check (PPM): 11/07/21  Endocrinology follow up 12/25/21  Cardiology follow up 01/09/22    Counseling:  -I have encouraged pt to continue to follow a <2gm/day Na restriction and emphasized limit fluid intake to < 2L/day.  -I have requested pt perform daily wts and contact our office for increase of >2-3lbs in 1 day or 5lbs in 1 week.   -I have encouraged pt to engage in aerobic activity as tolerated and avoid heavy weight lifting.   -Smoking/Alcohol: I have advised pt to abstain from any tobacco use and excessive alcohol intake.    -Weight: Obesity is a significant risk for both short and long-term morbidity and mortality for AHF patients.  I have stressed the importance of weight control and dietary management.      Our office is available to answer any questions you or the patient may have. We appreciate the opportunity to participate in the care of your patients.  Electronically signed by:      Merita Norton, NP  [facility]  [facility]  [facility]  [address]  [place], [place] [address]  [phone]   [phone] - fax             Note: This chart was generated by the [facility] EMR system/speech recognition and may contain  inherit omission or errors not intended by the user.  Grammatical errors, random word insertions, deletions, pronoun errors and incomplete sentences are occasionally consequences of this technology due to software limitations.  Not all errors are caught or corrected.  If there are questions or concerns about the content of this note or information contained in the body of this dictation they should be addressed directly with the author for clarification.

## 2021-10-03 NOTE — Patient Instructions (Signed)
Continue current doses of bumex and aldactone    If your weight is trending down, then decrease bumex to 4 mg in morning and 2 mg in afternoon, and decrease potassium to 10 meq once a  day.     Please have follow up lab work done in three months; basic metabolic panel.  You do not need to fast.    Follow up in clinic in 6 months; call sooner if questions.   [phone]

## 2021-10-05 ENCOUNTER — Ambulatory Visit: Payer: Self-pay | Admitting: Family

## 2021-10-12 ENCOUNTER — Other Ambulatory Visit (RURAL_HEALTH_CENTER): Payer: Self-pay | Admitting: Physician Assistant

## 2021-10-13 LAB — BASIC METABOLIC PANEL
BUN / Creatinine Ratio: 22 — ABNORMAL HIGH (ref 9–20)
BUN: 36 mg/dL — ABNORMAL HIGH (ref 6–24)
CO2: 23 mmol/L (ref 20–29)
Calcium: 9.7 mg/dL (ref 8.7–10.2)
Chloride: 100 mmol/L (ref 96–106)
Creatinine: 1.63 mg/dL — ABNORMAL HIGH (ref 0.76–1.27)
Glucose: 164 mg/dL — ABNORMAL HIGH (ref 70–99)
Potassium: 5 mmol/L (ref 3.5–5.2)
Sodium: 142 mmol/L (ref 134–144)
eGFR: 50 mL/min/{1.73_m2} — ABNORMAL LOW (ref >59)

## 2021-10-13 LAB — ALT: ALT: 29 IU/L (ref 0–44)

## 2021-10-13 LAB — LIPID PANEL
Cholesterol / HDL Ratio: 4.4 ratio (ref 0.0–5.0)
Cholesterol: 186 mg/dL (ref 100–199)
HDL: 42 mg/dL (ref >39)
LDL Chol Calculated (NIH): 107 mg/dL — ABNORMAL HIGH (ref 0–99)
Triglycerides: 212 mg/dL — ABNORMAL HIGH (ref 0–149)
VLDL Calculated: 37 mg/dL (ref 5–40)

## 2021-10-13 LAB — THYROID STIMULATING HORMONE (TSH), REFLEX ON ABNORMAL TO FREE T4, SERUM: TSH: 1 u[IU]/mL (ref 0.450–4.500)

## 2021-10-13 LAB — HEMOGLOBIN A1C: Hemoglobin A1C: 7.1 % — ABNORMAL HIGH (ref 4.8–5.6)

## 2021-11-12 ENCOUNTER — Other Ambulatory Visit (INDEPENDENT_AMBULATORY_CARE_PROVIDER_SITE_OTHER): Payer: Self-pay | Admitting: Registered Nurse

## 2021-11-12 DIAGNOSIS — E1165 Type 2 diabetes mellitus with hyperglycemia: Secondary | ICD-10-CM

## 2021-12-05 ENCOUNTER — Telehealth: Payer: Self-pay

## 2021-12-05 NOTE — Telephone Encounter (Signed)
Returned call to the number provided in message below. No answer. VM left requesting call back to discuss current medication dosages before discussing with our provider.    Darnelle Catalan, RN  [facility]  [phone]  [email or web address]

## 2021-12-05 NOTE — Telephone Encounter (Signed)
Patients wife called into the clinic today 12/05/21. Stating he has gained 12 pounds over night. I asked for BP reading and weights but she does not have them. She asked for an appointment. I stated to her that I take the call send it to a nurse. And then the nurse talks with the provider and then will contact the patient back. She stated they are going out of town as of tomorrow 12/06/21. Patient called into the clinic at 12:05pm stating his weights have been:     10/25-297 lbs  10/26- 297 lbs   10/27- 297 lbs  10/28- 300lbs   10/29- 300 lbs  10/30- 304lbs  10/31- 308 lbs  11/1- 308 lbs    He is having swelling in his ankles, wrist and stomach area. No cough no Shortness of breath. No other CHF symptoms please contact patient back at [phone].    Thank you,  Greig Castilla, AA  [facility]   [phone]

## 2021-12-25 ENCOUNTER — Ambulatory Visit (INDEPENDENT_AMBULATORY_CARE_PROVIDER_SITE_OTHER): Payer: 59 | Admitting: Physician Assistant

## 2022-01-09 ENCOUNTER — Encounter: Payer: 59 | Admitting: Physician Assistant

## 2022-01-16 ENCOUNTER — Encounter: Payer: 59 | Admitting: Medical

## 2022-02-27 ENCOUNTER — Encounter: Payer: 59 | Admitting: Medical

## 2022-04-04 ENCOUNTER — Ambulatory Visit: Payer: Self-pay | Admitting: Family
